# Patient Record
Sex: Male | Born: 1935 | Race: White | Hispanic: No | Marital: Married | Smoking: Former smoker
Health system: Southern US, Community
[De-identification: ages and names within clinical notes are randomized; demographics above are authoritative.]

## PROBLEM LIST (undated history)

## (undated) ENCOUNTER — Emergency Department (HOSPITAL_COMMUNITY): Discharge status: Absent without leave | Payer: Medicare Other

## (undated) DIAGNOSIS — J181 Lobar pneumonia, unspecified organism: Secondary | ICD-10-CM

## (undated) DIAGNOSIS — A419 Sepsis, unspecified organism: Secondary | ICD-10-CM

## (undated) DIAGNOSIS — I10 Essential (primary) hypertension: Secondary | ICD-10-CM

## (undated) DIAGNOSIS — I471 Supraventricular tachycardia: Secondary | ICD-10-CM

## (undated) DIAGNOSIS — D126 Benign neoplasm of colon, unspecified: Secondary | ICD-10-CM

## (undated) DIAGNOSIS — N200 Calculus of kidney: Secondary | ICD-10-CM

## (undated) DIAGNOSIS — I4891 Unspecified atrial fibrillation: Secondary | ICD-10-CM

## (undated) DIAGNOSIS — H3530 Unspecified macular degeneration: Secondary | ICD-10-CM

## (undated) DIAGNOSIS — I5032 Chronic diastolic (congestive) heart failure: Secondary | ICD-10-CM

## (undated) DIAGNOSIS — R6520 Severe sepsis without septic shock: Secondary | ICD-10-CM

## (undated) DIAGNOSIS — J9621 Acute and chronic respiratory failure with hypoxia: Secondary | ICD-10-CM

## (undated) DIAGNOSIS — I480 Paroxysmal atrial fibrillation: Secondary | ICD-10-CM

## (undated) DIAGNOSIS — E785 Hyperlipidemia, unspecified: Secondary | ICD-10-CM

## (undated) DIAGNOSIS — I482 Chronic atrial fibrillation, unspecified: Secondary | ICD-10-CM

## (undated) DIAGNOSIS — I4719 Other supraventricular tachycardia: Secondary | ICD-10-CM

## (undated) DIAGNOSIS — Z85828 Personal history of other malignant neoplasm of skin: Secondary | ICD-10-CM

## (undated) DIAGNOSIS — K5790 Diverticulosis of intestine, part unspecified, without perforation or abscess without bleeding: Secondary | ICD-10-CM

## (undated) HISTORY — DX: Calculus of kidney: N20.0

## (undated) HISTORY — DX: Other supraventricular tachycardia: I47.19

## (undated) HISTORY — DX: Supraventricular tachycardia: I47.1

## (undated) HISTORY — DX: Personal history of other malignant neoplasm of skin: Z85.828

## (undated) HISTORY — DX: Benign neoplasm of colon, unspecified: D12.6

## (undated) HISTORY — PX: INGUINAL HERNIA REPAIR: SUR1180

## (undated) HISTORY — DX: Essential (primary) hypertension: I10

## (undated) HISTORY — DX: Unspecified macular degeneration: H35.30

## (undated) HISTORY — PX: CATARACT EXTRACTION: SUR2

## (undated) HISTORY — DX: Unspecified atrial fibrillation: I48.91

## (undated) HISTORY — DX: Diverticulosis of intestine, part unspecified, without perforation or abscess without bleeding: K57.90

## (undated) HISTORY — DX: Paroxysmal atrial fibrillation: I48.0

## (undated) HISTORY — DX: Hyperlipidemia, unspecified: E78.5

---

## 1985-04-11 DIAGNOSIS — D126 Benign neoplasm of colon, unspecified: Secondary | ICD-10-CM

## 1985-04-11 HISTORY — DX: Benign neoplasm of colon, unspecified: D12.6

## 1998-05-21 ENCOUNTER — Other Ambulatory Visit: Admission: RE | Admit: 1998-05-21 | Discharge: 1998-05-21 | Payer: Self-pay | Admitting: Urology

## 1999-10-21 ENCOUNTER — Encounter: Payer: Self-pay | Admitting: Internal Medicine

## 1999-10-21 ENCOUNTER — Ambulatory Visit (HOSPITAL_COMMUNITY): Admission: RE | Admit: 1999-10-21 | Discharge: 1999-10-21 | Payer: Self-pay | Admitting: Internal Medicine

## 2001-01-25 ENCOUNTER — Encounter (INDEPENDENT_AMBULATORY_CARE_PROVIDER_SITE_OTHER): Payer: Self-pay | Admitting: *Deleted

## 2001-01-25 ENCOUNTER — Ambulatory Visit (HOSPITAL_BASED_OUTPATIENT_CLINIC_OR_DEPARTMENT_OTHER): Admission: RE | Admit: 2001-01-25 | Discharge: 2001-01-25 | Payer: Self-pay | Admitting: Plastic Surgery

## 2001-01-29 ENCOUNTER — Ambulatory Visit (HOSPITAL_BASED_OUTPATIENT_CLINIC_OR_DEPARTMENT_OTHER): Admission: RE | Admit: 2001-01-29 | Discharge: 2001-01-29 | Payer: Self-pay | Admitting: Plastic Surgery

## 2001-10-27 ENCOUNTER — Emergency Department (HOSPITAL_COMMUNITY): Admission: EM | Admit: 2001-10-27 | Discharge: 2001-10-27 | Payer: Self-pay | Admitting: Emergency Medicine

## 2001-10-27 ENCOUNTER — Encounter: Payer: Self-pay | Admitting: Emergency Medicine

## 2001-11-26 ENCOUNTER — Encounter: Payer: Self-pay | Admitting: Internal Medicine

## 2004-12-01 ENCOUNTER — Ambulatory Visit: Payer: Self-pay | Admitting: Internal Medicine

## 2005-11-25 ENCOUNTER — Ambulatory Visit: Payer: Self-pay | Admitting: Internal Medicine

## 2006-11-29 ENCOUNTER — Ambulatory Visit: Payer: Self-pay | Admitting: Internal Medicine

## 2006-11-29 LAB — CONVERTED CEMR LAB
ALT: 19 units/L (ref 0–40)
AST: 16 units/L (ref 0–37)
Albumin: 3.8 g/dL (ref 3.5–5.2)
Alkaline Phosphatase: 55 units/L (ref 39–117)
BUN: 16 mg/dL (ref 6–23)
Basophils Absolute: 0 10*3/uL (ref 0.0–0.1)
Basophils Relative: 0.4 % (ref 0.0–1.0)
CO2: 26 meq/L (ref 19–32)
Calcium: 9.5 mg/dL (ref 8.4–10.5)
Chloride: 105 meq/L (ref 96–112)
Chol/HDL Ratio, serum: 4.2
Cholesterol: 193 mg/dL (ref 0–200)
Creatinine, Ser: 1.3 mg/dL (ref 0.4–1.5)
Eosinophil percent: 2.4 % (ref 0.0–5.0)
GFR calc non Af Amer: 56 mL/min
Glomerular Filtration Rate, Af Am: 68 mL/min/{1.73_m2}
Glucose, Bld: 99 mg/dL (ref 70–99)
HCT: 48.2 % (ref 39.0–52.0)
HDL: 45.7 mg/dL (ref >39.0)
Hemoglobin: 15.5 g/dL (ref 13.0–17.0)
LDL Cholesterol: 114 mg/dL — ABNORMAL HIGH (ref 0–99)
Lymphocytes Relative: 28.3 % (ref 12.0–46.0)
MCHC: 32.1 g/dL (ref 30.0–36.0)
MCV: 96.7 fL (ref 78.0–100.0)
Monocytes Absolute: 0.6 10*3/uL (ref 0.2–0.7)
Monocytes Relative: 6.8 % (ref 3.0–11.0)
Neutro Abs: 5.4 10*3/uL (ref 1.4–7.7)
Neutrophils Relative %: 62.1 % (ref 43.0–77.0)
Platelets: 286 10*3/uL (ref 150–400)
Potassium: 4.8 meq/L (ref 3.5–5.1)
RBC: 4.98 M/uL (ref 4.22–5.81)
RDW: 12.8 % (ref 11.5–14.6)
Sodium: 139 meq/L (ref 135–145)
TSH: 0.72 microintl units/mL (ref 0.35–5.50)
Total Bilirubin: 0.8 mg/dL (ref 0.3–1.2)
Total Protein: 7.5 g/dL (ref 6.0–8.3)
Triglyceride fasting, serum: 166 mg/dL — ABNORMAL HIGH (ref 0–149)
VLDL: 33 mg/dL (ref 0–40)
WBC: 8.6 10*3/uL (ref 4.5–10.5)

## 2007-03-26 ENCOUNTER — Ambulatory Visit: Payer: Self-pay | Admitting: Gastroenterology

## 2007-05-15 ENCOUNTER — Encounter: Payer: Self-pay | Admitting: Internal Medicine

## 2007-05-15 ENCOUNTER — Ambulatory Visit: Payer: Self-pay | Admitting: Gastroenterology

## 2007-05-15 DIAGNOSIS — K5730 Diverticulosis of large intestine without perforation or abscess without bleeding: Secondary | ICD-10-CM | POA: Insufficient documentation

## 2007-05-15 DIAGNOSIS — K5521 Angiodysplasia of colon with hemorrhage: Secondary | ICD-10-CM

## 2007-05-15 LAB — HM COLONOSCOPY

## 2007-06-20 ENCOUNTER — Ambulatory Visit: Payer: Self-pay | Admitting: Gastroenterology

## 2007-11-27 ENCOUNTER — Encounter: Payer: Self-pay | Admitting: Internal Medicine

## 2007-12-04 ENCOUNTER — Ambulatory Visit: Payer: Self-pay | Admitting: Internal Medicine

## 2007-12-04 DIAGNOSIS — Z860100 Personal history of colon polyps, unspecified: Secondary | ICD-10-CM | POA: Insufficient documentation

## 2007-12-04 DIAGNOSIS — E785 Hyperlipidemia, unspecified: Secondary | ICD-10-CM

## 2007-12-04 DIAGNOSIS — I10 Essential (primary) hypertension: Secondary | ICD-10-CM | POA: Insufficient documentation

## 2007-12-05 LAB — CONVERTED CEMR LAB
ALT: 18 units/L (ref 0–53)
AST: 18 units/L (ref 0–37)
Albumin: 4.1 g/dL (ref 3.5–5.2)
Alkaline Phosphatase: 55 units/L (ref 39–117)
BUN: 15 mg/dL (ref 6–23)
Basophils Absolute: 0.1 10*3/uL (ref 0.0–0.1)
Basophils Relative: 0.8 % (ref 0.0–1.0)
Bilirubin, Direct: 0.2 mg/dL (ref 0.0–0.3)
CO2: 27 meq/L (ref 19–32)
Calcium: 9.8 mg/dL (ref 8.4–10.5)
Chloride: 103 meq/L (ref 96–112)
Cholesterol: 183 mg/dL (ref 0–200)
Creatinine, Ser: 1.2 mg/dL (ref 0.4–1.5)
Eosinophils Absolute: 0.2 10*3/uL (ref 0.0–0.6)
Eosinophils Relative: 2.2 % (ref 0.0–5.0)
GFR calc Af Amer: 75 mL/min
GFR calc non Af Amer: 62 mL/min
Glucose, Bld: 77 mg/dL (ref 70–99)
HCT: 47.2 % (ref 39.0–52.0)
HDL: 46.7 mg/dL (ref >39.0)
Hemoglobin: 15.9 g/dL (ref 13.0–17.0)
LDL Cholesterol: 107 mg/dL — ABNORMAL HIGH (ref 0–99)
Lymphocytes Relative: 23.4 % (ref 12.0–46.0)
MCHC: 33.8 g/dL (ref 30.0–36.0)
MCV: 95.7 fL (ref 78.0–100.0)
Monocytes Absolute: 0.6 10*3/uL (ref 0.2–0.7)
Monocytes Relative: 6.5 % (ref 3.0–11.0)
Neutro Abs: 6 10*3/uL (ref 1.4–7.7)
Neutrophils Relative %: 67.1 % (ref 43.0–77.0)
Platelets: 238 10*3/uL (ref 150–400)
Potassium: 5.3 meq/L — ABNORMAL HIGH (ref 3.5–5.1)
RBC: 4.93 M/uL (ref 4.22–5.81)
RDW: 12.7 % (ref 11.5–14.6)
Sodium: 140 meq/L (ref 135–145)
Total Bilirubin: 0.9 mg/dL (ref 0.3–1.2)
Total CHOL/HDL Ratio: 3.9
Total Protein: 7.7 g/dL (ref 6.0–8.3)
Triglycerides: 146 mg/dL (ref 0–149)
VLDL: 29 mg/dL (ref 0–40)
WBC: 9 10*3/uL (ref 4.5–10.5)

## 2008-01-02 ENCOUNTER — Ambulatory Visit: Payer: Self-pay | Admitting: Internal Medicine

## 2008-03-07 ENCOUNTER — Ambulatory Visit: Payer: Self-pay | Admitting: Internal Medicine

## 2008-05-07 ENCOUNTER — Ambulatory Visit: Payer: Self-pay | Admitting: Internal Medicine

## 2008-06-19 ENCOUNTER — Ambulatory Visit: Payer: Self-pay | Admitting: Internal Medicine

## 2008-06-20 LAB — CONVERTED CEMR LAB
Calcium: 9 mg/dL (ref 8.4–10.5)
Creatinine, Ser: 1.1 mg/dL (ref 0.4–1.5)
GFR calc non Af Amer: 68 mL/min
HDL: 39.5 mg/dL (ref >39.0)
LDL Cholesterol: 97 mg/dL (ref 0–99)
Sodium: 139 meq/L (ref 135–145)
Total Bilirubin: 0.7 mg/dL (ref 0.3–1.2)
Total CHOL/HDL Ratio: 4
Triglycerides: 111 mg/dL (ref 0–149)

## 2008-11-25 ENCOUNTER — Encounter: Payer: Self-pay | Admitting: Internal Medicine

## 2008-12-02 ENCOUNTER — Ambulatory Visit: Payer: Self-pay | Admitting: Internal Medicine

## 2008-12-04 LAB — CONVERTED CEMR LAB
ALT: 17 units/L (ref 0–53)
Basophils Absolute: 0.1 10*3/uL (ref 0.0–0.1)
Bilirubin, Direct: 0.1 mg/dL (ref 0.0–0.3)
CO2: 29 meq/L (ref 19–32)
Calcium: 9.3 mg/dL (ref 8.4–10.5)
Cholesterol: 171 mg/dL (ref 0–200)
HDL: 47.4 mg/dL (ref >39.0)
LDL Cholesterol: 103 mg/dL — ABNORMAL HIGH (ref 0–99)
Lymphocytes Relative: 31.9 % (ref 12.0–46.0)
MCHC: 35.9 g/dL (ref 30.0–36.0)
Neutro Abs: 4.1 10*3/uL (ref 1.4–7.7)
Neutrophils Relative %: 56.6 % (ref 43.0–77.0)
Platelets: 226 10*3/uL (ref 150–400)
Potassium: 4.5 meq/L (ref 3.5–5.1)
RDW: 12.4 % (ref 11.5–14.6)
Sodium: 141 meq/L (ref 135–145)
Total Bilirubin: 0.7 mg/dL (ref 0.3–1.2)
Triglycerides: 104 mg/dL (ref 0–149)
VLDL: 21 mg/dL (ref 0–40)

## 2008-12-17 ENCOUNTER — Encounter: Payer: Self-pay | Admitting: Internal Medicine

## 2008-12-24 ENCOUNTER — Telehealth: Payer: Self-pay | Admitting: Internal Medicine

## 2009-03-10 ENCOUNTER — Encounter: Payer: Self-pay | Admitting: Internal Medicine

## 2009-06-03 ENCOUNTER — Telehealth: Payer: Self-pay | Admitting: Internal Medicine

## 2009-09-04 ENCOUNTER — Encounter: Payer: Self-pay | Admitting: Internal Medicine

## 2009-12-09 ENCOUNTER — Telehealth: Payer: Self-pay | Admitting: Internal Medicine

## 2010-01-05 ENCOUNTER — Ambulatory Visit: Payer: Self-pay | Admitting: Internal Medicine

## 2010-01-05 DIAGNOSIS — C61 Malignant neoplasm of prostate: Secondary | ICD-10-CM | POA: Insufficient documentation

## 2010-01-26 LAB — CONVERTED CEMR LAB
Albumin: 3.8 g/dL (ref 3.5–5.2)
Basophils Absolute: 0.1 10*3/uL (ref 0.0–0.1)
Bilirubin, Direct: 0.1 mg/dL (ref 0.0–0.3)
Chloride: 109 meq/L (ref 96–112)
Cholesterol: 167 mg/dL (ref 0–200)
Eosinophils Absolute: 0.2 10*3/uL (ref 0.0–0.7)
Hemoglobin: 14.4 g/dL (ref 13.0–17.0)
LDL Cholesterol: 89 mg/dL (ref 0–99)
Lymphocytes Relative: 32.3 % (ref 12.0–46.0)
MCHC: 32.6 g/dL (ref 30.0–36.0)
Monocytes Relative: 9.8 % (ref 3.0–12.0)
Neutro Abs: 3.4 10*3/uL (ref 1.4–7.7)
Neutrophils Relative %: 53.5 % (ref 43.0–77.0)
Platelets: 182 10*3/uL (ref 150.0–400.0)
Potassium: 5.3 meq/L — ABNORMAL HIGH (ref 3.5–5.1)
RBC: 4.53 M/uL (ref 4.22–5.81)
RDW: 12.7 % (ref 11.5–14.6)
TSH: 0.72 microintl units/mL (ref 0.35–5.50)
Total Protein: 7.4 g/dL (ref 6.0–8.3)
Triglycerides: 155 mg/dL — ABNORMAL HIGH (ref 0.0–149.0)
VLDL: 31 mg/dL (ref 0.0–40.0)
WBC: 6.3 10*3/uL (ref 4.5–10.5)

## 2010-03-04 ENCOUNTER — Encounter: Payer: Self-pay | Admitting: Internal Medicine

## 2010-05-18 ENCOUNTER — Encounter: Admission: RE | Admit: 2010-05-18 | Discharge: 2010-08-16 | Payer: Self-pay | Admitting: Ophthalmology

## 2010-06-15 ENCOUNTER — Telehealth: Payer: Self-pay | Admitting: Internal Medicine

## 2010-09-08 ENCOUNTER — Encounter: Payer: Self-pay | Admitting: Internal Medicine

## 2010-11-11 HISTORY — PX: TENDON REPAIR: SHX5111

## 2010-12-04 ENCOUNTER — Emergency Department (HOSPITAL_COMMUNITY)
Admission: EM | Admit: 2010-12-04 | Discharge: 2010-12-04 | Payer: Self-pay | Source: Home / Self Care | Admitting: Emergency Medicine

## 2010-12-08 ENCOUNTER — Ambulatory Visit (HOSPITAL_COMMUNITY)
Admission: RE | Admit: 2010-12-08 | Discharge: 2010-12-09 | Payer: Self-pay | Source: Home / Self Care | Attending: Orthopedic Surgery | Admitting: Orthopedic Surgery

## 2011-01-02 ENCOUNTER — Encounter: Payer: Self-pay | Admitting: Gastroenterology

## 2011-01-07 ENCOUNTER — Other Ambulatory Visit: Payer: Self-pay | Admitting: Internal Medicine

## 2011-01-07 ENCOUNTER — Ambulatory Visit
Admission: RE | Admit: 2011-01-07 | Discharge: 2011-01-07 | Payer: Self-pay | Source: Home / Self Care | Attending: Internal Medicine | Admitting: Internal Medicine

## 2011-01-07 LAB — BASIC METABOLIC PANEL
BUN: 22 mg/dL (ref 6–23)
CO2: 27 mEq/L (ref 19–32)
Calcium: 9.4 mg/dL (ref 8.4–10.5)
Chloride: 105 mEq/L (ref 96–112)
Creatinine, Ser: 1.3 mg/dL (ref 0.4–1.5)
GFR: 56.71 mL/min — ABNORMAL LOW (ref >60.00)
Glucose, Bld: 98 mg/dL (ref 70–99)
Potassium: 4.8 mEq/L (ref 3.5–5.1)
Sodium: 140 mEq/L (ref 135–145)

## 2011-01-07 LAB — CBC WITH DIFFERENTIAL/PLATELET
Eosinophils Absolute: 0.2 10*3/uL (ref 0.0–0.7)
HCT: 39.5 % (ref 39.0–52.0)
Lymphs Abs: 2.6 10*3/uL (ref 0.7–4.0)
MCHC: 33.6 g/dL (ref 30.0–36.0)
MCV: 94.1 fl (ref 78.0–100.0)
Monocytes Absolute: 0.7 10*3/uL (ref 0.1–1.0)
Neutrophils Relative %: 67.3 % (ref 43.0–77.0)
Platelets: 264 10*3/uL (ref 150.0–400.0)

## 2011-01-07 LAB — TSH: TSH: 0.7 u[IU]/mL (ref 0.35–5.50)

## 2011-01-07 LAB — HEPATIC FUNCTION PANEL
Bilirubin, Direct: 0.1 mg/dL (ref 0.0–0.3)
Total Bilirubin: 0.5 mg/dL (ref 0.3–1.2)
Total Protein: 6.9 g/dL (ref 6.0–8.3)

## 2011-01-07 LAB — LIPID PANEL
Cholesterol: 133 mg/dL (ref 0–200)
HDL: 37 mg/dL — ABNORMAL LOW (ref >39.00)
LDL Cholesterol: 75 mg/dL (ref 0–99)
Total CHOL/HDL Ratio: 4
Triglycerides: 106 mg/dL (ref 0.0–149.0)
VLDL: 21.2 mg/dL (ref 0.0–40.0)

## 2011-01-07 LAB — PSA: PSA: 11.53 ng/mL — ABNORMAL HIGH (ref 0.10–4.00)

## 2011-01-11 NOTE — Progress Notes (Signed)
Summary: lovastatin, lisinopril  Phone Note Refill Request Message from:  Fax from Pharmacy on June 15, 2010 9:37 AM  Refills Requested: Medication #1:  LISINOPRIL 40 MG TABS once daily   Notes: [facility]  [phone]  Medication #2:  LOVASTATIN 40 MG TABS once daily   Notes: [facility]  [phone]    Initial call taken by: Debbra Riding,  June 15, 2010 9:38 AM    Prescriptions: LOVASTATIN 40 MG TABS (LOVASTATIN) once daily  #90 x 3   Entered by:   Gladis Riffle, RN   Authorized by:   Birdie Sons MD   Signed by:   Gladis Riffle, RN on 06/15/2010   Method used:   Electronically to        [facility] (mail-order)       [address]       [place], [place]  [address]       Ph: [phone]       Fax: [phone]   RxID:   [id number] LISINOPRIL 40 MG TABS (LISINOPRIL) once daily  #90 x 3   Entered by:   Gladis Riffle, RN   Authorized by:   Birdie Sons MD   Signed by:   Gladis Riffle, RN on 06/15/2010   Method used:   Electronically to        [facility]* (mail-order)       [address] [place]  [address]       Ph: [phone]       Fax: [phone]   RxID:   [id number]

## 2011-01-11 NOTE — Letter (Signed)
Summary: [facility]  [facility]   Imported By: Maryln Gottron 03/16/2010 12:16:26  _____________________________________________________________________  External Attachment:    Type:   Image     Comment:   External Document

## 2011-01-11 NOTE — Assessment & Plan Note (Signed)
Summary: emp-will fast//ccm rsc due weather/njr/pt rsc from bmp/cjr   Vital Signs:  Patient profile:   77 year old male Height:      64 inches Weight:      161 pounds Pulse rate:   80 / minute Resp:     12 per minute BP sitting:   140 / 68  (left arm)  Vitals Entered By: Gladis Riffle, RN (January 05, 2010 8:56 AM)   Prevention & Chronic Care Immunizations   Influenza vaccine: declines  (01/05/2010)    Tetanus booster: 12/02/2008: Td    Pneumococcal vaccine: Pneumovax  (11/07/2003)    H. zoster vaccine: Not documented  Colorectal Screening   Hemoccult: Not documented    Colonoscopy: AVM  (05/15/2007)   Colonoscopy due: 05/14/2017  Other Screening   PSA: Not documented   Smoking status: quit  (01/05/2010)  Lipids   Total Cholesterol: 171  (12/02/2008)   Lipid panel action/deferral: Lipid Panel ordered   LDL: 103  (12/02/2008)   LDL Direct: Not documented   HDL: 47.4  (12/02/2008)   Triglycerides: 104  (12/02/2008)    SGOT (AST): 17  (12/02/2008)   BMP action: Ordered   SGPT (ALT): 17  (12/02/2008)   Alkaline phosphatase: 44  (12/02/2008)   Total bilirubin: 0.7  (12/02/2008)  Hypertension   Last Blood Pressure: 140 / 68  (01/05/2010)   Serum creatinine: 1.1  (12/02/2008)   BMP action: Ordered   Serum potassium 4.5  (12/02/2008)    Hypertension flowsheet reviewed?: Yes   Progress toward BP goal: Unchanged  Self-Management Support :    Hypertension self-management support: Not documented    Lipid self-management support: Not documented    History of Present Illness: medicare review pt denies depression, has no memory concerns  in addition has HTN---tolerating meds without difficulty  new dx prostate --- followed by dr. borden--reviewed his note  lipids---tolerating meds without difficulty  Preventive Screening-Counseling & Management  Alcohol-Tobacco     Smoking Status: quit  Current Problems (verified): 1)  Malignant Neoplasm of Prostate   (ICD-185) 2)  Angiodysplasia of Intestine With Hemorrhage  (ICD-569.85) 3)  Diverticulosis, Colon  (ICD-562.10) 4)  Hypertension  (ICD-401.9) 5)  Hyperlipidemia  (ICD-272.4) 6)  Colonic Polyps, Hx of  (ICD-V12.72)  Current Medications (verified): 1)  Lisinopril 40 Mg Tabs (Lisinopril) .... Once Daily 2)  Lovastatin 40 Mg Tabs (Lovastatin) .... Once Daily 3)  Baby Aspirin 81 Mg  Chew (Aspirin) .... Once Daily 4)  Abc Plus Senior   Tabs (Multiple Vitamins-Minerals) .... Once Daily 5)  Azor 10-40 Mg  Tabs (Amlodipine-Olmesartan) .... Take 1 Tab By Mouth Every Day 6)  Caltrate 600+d 600-400 Mg-Unit Tabs (Calcium Carbonate-Vitamin D) .... Once Daily  Allergies (verified): No Known Drug Allergies  Comments:  Nurse/Medical Assistant: annual review of systems, fasting--  The patient's medications and allergies were reviewed with the patient and were updated in the Medication and Allergy Lists. Gladis Riffle, RN (January 05, 2010 8:58 AM)  Past History:  Past Medical History: Last updated: 12/04/2007 Colonic polyps, hx of Hyperlipidemia Hypertension PAT R eye macular degeneration  Past Surgical History: Last updated: 12/04/2007 Cataract extraction Inguinal herniorrhaphy  Family History: Last updated: 12/04/2007 Family History Lung cancer-60 mother deceased 28 yo  Social History: Last updated: 12/04/2007 Married Former Smoker  Risk Factors: Smoking Status: quit (01/05/2010)  Review of Systems       All other systems reviewed and were negative   Physical Exam  General:  Well-developed,well-nourished,in no acute distress;  alert,appropriate and cooperative throughout examination Head:  Normocephalic and atraumatic without obvious abnormalities. No apparent alopecia or balding. Ears:  R ear normal and L ear normal.   Neck:  No deformities, masses, or tenderness noted. Chest Wall:  No deformities, masses, tenderness or gynecomastia noted. Lungs:  Normal respiratory  effort, chest expands symmetrically. Lungs are clear to auscultation, no crackles or wheezes. Heart:  Normal rate and regular rhythm. S1 and S2 normal without gallop, murmur, click, rub or other extra sounds. Abdomen:  soft and non-tender.   bilateral abdominal/enal bruits R>L Prostate:  UROLOGY Msk:  normal ROM and no joint swelling.   Neurologic:  cranial nerves II-XII intact and gait normal.   Skin:  turgor normal and color normal.   Inguinal Nodes:  no R inguinal adenopathy and no L inguinal adenopathy.   Psych:  normally interactive and good eye contact.     Impression & Recommendations:  Problem # 1:  Preventive Health Care (ICD-V70.0) medicare visit  Problem # 2:  HYPERTENSION (ICD-401.9)  he is on three meds---may need to change meds---will decide after lab work BP not adequately controlled--BP at home 140-150s/60s His updated medication list for this problem includes:    Lisinopril 40 Mg Tabs (Lisinopril) ..... Once daily    Azor 10-40 Mg Tabs (Amlodipine-olmesartan) .Marland Kitchen... Take 1 tab by mouth every day  Orders: TLB-BMP (Basic Metabolic Panel-BMET) (80048-METABOL)  BP today: 140/68 Prior BP: 162/74 (12/02/2008)  Prior 10 Yr Risk Heart Disease: 33 % (01/02/2008)  Labs Reviewed: K+: 4.5 (12/02/2008) Creat: : 1.1 (12/02/2008)   Chol: 171 (12/02/2008)   HDL: 47.4 (12/02/2008)   LDL: 103 (12/02/2008)   TG: 104 (12/02/2008)  Problem # 3:  HYPERLIPIDEMIA (ICD-272.4) check labs today discussed the possibility of changing meds (simvastatin)---consider after labs His updated medication list for this problem includes:    Lovastatin 40 Mg Tabs (Lovastatin) ..... Once daily  Orders: TLB-Lipid Panel (80061-LIPID) TLB-TSH (Thyroid Stimulating Hormone) (84443-TSH) Venipuncture (82956)  Complete Medication List: 1)  Lisinopril 40 Mg Tabs (Lisinopril) .... Once daily 2)  Lovastatin 40 Mg Tabs (Lovastatin) .... Once daily 3)  Baby Aspirin 81 Mg Chew (Aspirin) .... Once  daily 4)  Abc Plus Senior Tabs (Multiple vitamins-minerals) .... Once daily 5)  Azor 10-40 Mg Tabs (Amlodipine-olmesartan) .... Take 1 tab by mouth every day 6)  Caltrate 600+d 600-400 Mg-unit Tabs (Calcium carbonate-vitamin d) .... Once daily  Other Orders: TLB-Hepatic/Liver Function Pnl (80076-HEPATIC) TLB-CBC Platelet - w/Differential (85025-CBCD) EMR Misc Charge Code [facility])   Immunization History:  Pneumovax Immunization History:    Pneumovax:  pneumovax (11/07/2003)  Influenza Immunization History:    Influenza:  declines (01/05/2010)

## 2011-01-11 NOTE — Letter (Signed)
Summary: [facility]  [facility]   Imported By: Maryln Gottron 09/15/2010 14:58:22  _____________________________________________________________________  External Attachment:    Type:   Image     Comment:   External Document

## 2011-01-13 NOTE — Assessment & Plan Note (Signed)
Summary: emp/pt coming in fasting/cjr   Vital Signs:  Patient profile:   77 year old male Height:      64 inches Weight:      148 pounds BMI:     25.50 Temp:     98.7 degrees F oral Pulse rate:   92 / minute Pulse rhythm:   regular BP sitting:   132 / 80  (left arm) Cuff size:   regular  Vitals Entered By: Alfred Levins, CMA (January 07, 2011 10:19 AM) CC: cpx, fasting   CC:  cpx and fasting.  History of Present Illness: Here for Medicare AWV:  1.   Risk factors based on Past M, S, F history:---see list 2.   Physical Activities: --pt remains acitve but has had recent fall with significant injury to right leg 3.   Depression/mood: ---no concerns 4.   Hearing: ---no concerns 5.   ADL's: ---able to do all even with brace on his leg 6.   Fall Risk: -- no concerns---using walker currently 7.   Home Safety: --no concerns 8.   Height, weight, &visual acuity:--doing well 9.   Counseling: ---none necessary 10.   Labs ordered based on risk factors: see labs  11.           Referral Coordination : none necessary 12.           Care Plan--- continue excellent health habits 13.            Cognitive Assessment: no concerns  Current Problems:  MALIGNANT NEOPLASM OF PROSTATE (ICD-185)---followed by urology ANGIODYSPLASIA OF INTESTINE WITH HEMORRHAGE (ICD-569.85)--no Gi bleeding HYPERTENSION (ICD-401.9)---no sxs on meds HYPERLIPIDEMIA (ICD-272.4)---tolerating meds       Current Problems (verified): 1)  Health Screening  (ICD-V70.0) 2)  Malignant Neoplasm of Prostate  (ICD-185) 3)  Angiodysplasia of Intestine With Hemorrhage  (ICD-569.85) 4)  Diverticulosis, Colon  (ICD-562.10) 5)  Hypertension  (ICD-401.9) 6)  Hyperlipidemia  (ICD-272.4) 7)  Colonic Polyps, Hx of  (ICD-V12.72)  Current Medications (verified): 1)  Lisinopril 40 Mg Tabs (Lisinopril) .... Once Daily 2)  Lovastatin 40 Mg Tabs (Lovastatin) .... Once Daily 3)  Baby Aspirin 81 Mg  Chew (Aspirin) .... Once Daily 4)   Abc Plus Senior   Tabs (Multiple Vitamins-Minerals) .... Once Daily 5)  Azor 10-40 Mg  Tabs (Amlodipine-Olmesartan) .... Take 1 Tab By Mouth Every Day 6)  Caltrate 600+d 600-400 Mg-Unit Tabs (Calcium Carbonate-Vitamin D) .... Once Daily  Allergies (verified): No Known Drug Allergies  Past History:  Past Medical History: Last updated: 12/04/2007 Colonic polyps, hx of Hyperlipidemia Hypertension PAT R eye macular degeneration  Family History: Last updated: 12/04/2007 Family History Lung cancer-60 mother deceased 73 yo  Social History: Last updated: 12/04/2007 Married Former Smoker  Risk Factors: Smoking Status: quit (01/05/2010)  Past Surgical History: Cataract extraction Inguinal herniorrhaphy right leg tendon repair 11/2010  Physical Exam  General:  alert and well-developed.   Head:  normocephalic and atraumatic.   Eyes:  pupils equal and pupils round.   Ears:  R ear normal and L ear normal.   Neck:  No deformities, masses, or tenderness noted. Lungs:  Normal respiratory effort, chest expands symmetrically. Lungs are clear to auscultation, no crackles or wheezes. Heart:  normal rate and regular rhythm.   Abdomen:  soft and non-tender.   Cervical Nodes:  no anterior cervical adenopathy and no posterior cervical adenopathy.   Psych:  good eye contact and not anxious appearing.     Impression & Recommendations:  Problem # 1:  HEALTH SCREENING (ICD-V70.0)  health maint utd  Orders: Medicare -1st Annual Wellness Visit [phone])  Problem # 2:  HYPERTENSION (ICD-401.9) controlled continue current medications  note ACE ARB combination His updated medication list for this problem includes:    Lisinopril 40 Mg Tabs (Lisinopril) ..... Once daily    Azor 10-40 Mg Tabs (Amlodipine-olmesartan) .Marland Kitchen... Take 1 tab by mouth every day  Orders: Fingerstick (60454) Specimen Handling (09811) Venipuncture (91478) TLB-BMP (Basic Metabolic Panel-BMET)  (80048-METABOL) TLB-CBC Platelet - w/Differential (85025-CBCD)  BP today: 132/80 Prior BP: 140/68 (01/05/2010)  Prior 10 Yr Risk Heart Disease: 33 % (01/02/2008)  Labs Reviewed: K+: 5.3 (01/05/2010) Creat: : 1.2 (01/05/2010)   Chol: 167 (01/05/2010)   HDL: 47.30 (01/05/2010)   LDL: 89 (01/05/2010)   TG: 155.0 (01/05/2010)  Problem # 3:  HYPERLIPIDEMIA (ICD-272.4) needs labs His updated medication list for this problem includes:    Lovastatin 40 Mg Tabs (Lovastatin) ..... Once daily  Orders: Fingerstick ([address]) Specimen Handling (13086) Venipuncture (57846) TLB-Lipid Panel (80061-LIPID) TLB-Hepatic/Liver Function Pnl (80076-HEPATIC) TLB-TSH (Thyroid Stimulating Hormone) (84443-TSH)  Labs Reviewed: SGOT: 19 (01/05/2010)   SGPT: 18 (01/05/2010)  Prior 10 Yr Risk Heart Disease: 33 % (01/02/2008)   HDL:47.30 (01/05/2010), 47.4 (12/02/2008)  LDL:89 (01/05/2010), 103 (96/29/5284)  Chol:167 (01/05/2010), 171 (12/02/2008)  Trig:155.0 (01/05/2010), 104 (12/02/2008)  Problem # 4:  MALIGNANT NEOPLASM OF PROSTATE (ICD-185) followed by urology Orders: Fingerstick (13244) Specimen Handling (01027) TLB-PSA (Prostate Specific Antigen) (84153-PSA)  Complete Medication List: 1)  Lisinopril 40 Mg Tabs (Lisinopril) .... Once daily 2)  Lovastatin 40 Mg Tabs (Lovastatin) .... Once daily 3)  Baby Aspirin 81 Mg Chew (Aspirin) .... Once daily 4)  Abc Plus Senior Tabs (Multiple vitamins-minerals) .... Once daily 5)  Azor 10-40 Mg Tabs (Amlodipine-olmesartan) .... Take 1 tab by mouth every day 6)  Caltrate 600+d 600-400 Mg-unit Tabs (Calcium carbonate-vitamin d) .... Once daily Prescriptions: AZOR 10-40 MG  TABS (AMLODIPINE-OLMESARTAN) Take 1 tab by mouth every day  #90 x 3   Entered by:   Alfred Levins, CMA   Authorized by:   Birdie Sons MD   Signed by:   Alfred Levins, CMA on 01/07/2011   Method used:   Electronically to        [facility] ([facility]order)       [address]       [place], [place]  [address]       Ph: [phone]       Fax: [phone]   RxID:   [id number] LOVASTATIN 40 MG TABS (LOVASTATIN) once daily  #90 x 3   Entered by:   Alfred Levins, CMA   Authorized by:   Birdie Sons MD   Signed by:   Alfred Levins, CMA on 01/07/2011   Method used:   Electronically to        [facility] ([facility]order)       [address]       [place], [place]  [address]       Ph: [phone]       Fax: [phone]   RxID:   [id number] LISINOPRIL 40 MG TABS (LISINOPRIL) once daily  #90 x 3   Entered by:   Alfred Levins, CMA   Authorized by:   Birdie Sons MD   Signed by:   Alfred Levins, CMA on 01/07/2011   Method used:   Electronically to        [facility] ([facility]order)  [address]       [place], [place]  [address]       Ph: [phone]       Fax: [phone]   RxID:   [id number]    Orders Added: 1)  Fingerstick [56387] 2)  Specimen Handling [99000] 3)  Venipuncture [56433] 4)  TLB-Lipid Panel [80061-LIPID] 5)  TLB-BMP (Basic Metabolic Panel-BMET) [80048-METABOL] 6)  TLB-CBC Platelet - w/Differential [85025-CBCD] 7)  TLB-Hepatic/Liver Function Pnl [80076-HEPATIC] 8)  TLB-TSH (Thyroid Stimulating Hormone) [84443-TSH] 9)  TLB-PSA (Prostate Specific Antigen) [29518-ACZ] 10)  Medicare -1st Annual Wellness Visit [G0438] 11)  Est. Patient Level III [66063]

## 2011-02-21 LAB — CBC
Hemoglobin: 14.7 g/dL (ref 13.0–17.0)
MCHC: 34.9 g/dL (ref 30.0–36.0)
RBC: 4.56 MIL/uL (ref 4.22–5.81)

## 2011-02-21 LAB — URINALYSIS, ROUTINE W REFLEX MICROSCOPIC
Bilirubin Urine: NEGATIVE
Glucose, UA: NEGATIVE mg/dL
Hgb urine dipstick: NEGATIVE
Specific Gravity, Urine: 1.018 (ref 1.005–1.030)

## 2011-02-21 LAB — BASIC METABOLIC PANEL
Calcium: 9.2 mg/dL (ref 8.4–10.5)
GFR calc Af Amer: >60 mL/min (ref >60)
GFR calc non Af Amer: 56 mL/min — ABNORMAL LOW (ref >60)
Glucose, Bld: 115 mg/dL — ABNORMAL HIGH (ref 70–99)
Sodium: 136 mEq/L (ref 135–145)

## 2011-02-21 LAB — DIFFERENTIAL
Basophils Relative: 0 % (ref 0–1)
Monocytes Relative: 8 % (ref 3–12)
Neutro Abs: 5.7 10*3/uL (ref 1.7–7.7)
Neutrophils Relative %: 59 % (ref 43–77)

## 2011-02-21 LAB — PROTIME-INR
INR: 0.96 (ref 0.00–1.49)
Prothrombin Time: 13 seconds (ref 11.6–15.2)

## 2011-02-21 LAB — APTT: aPTT: 32 seconds (ref 24–37)

## 2011-03-02 ENCOUNTER — Other Ambulatory Visit: Payer: Self-pay | Admitting: *Deleted

## 2011-03-02 ENCOUNTER — Telehealth: Payer: Self-pay | Admitting: *Deleted

## 2011-03-02 NOTE — Telephone Encounter (Signed)
Find out dose of azor

## 2011-03-02 NOTE — Telephone Encounter (Signed)
Pt is taking Azor ( very expensive).  Any generic around as of yet ?? Needs pres sent to him, if so. Not out of meds. [phone]

## 2011-03-02 NOTE — Telephone Encounter (Signed)
Left message to ask what mg of Azor pt is on?  40-10 daily.

## 2011-03-04 MED ORDER — LOSARTAN POTASSIUM 100 MG PO TABS: 100.0000 mg | ORAL_TABLET | Freq: Every day | ORAL | Status: DC

## 2011-03-04 MED ORDER — AMLODIPINE BESYLATE 10 MG PO TABS: 10.0000 mg | ORAL_TABLET | Freq: Every day | ORAL | Status: DC

## 2011-03-04 NOTE — Telephone Encounter (Signed)
Change Azor to losartan 100 mg. One po daily. Amlodipine 10 mg. One po daily. [place] notified.

## 2011-04-18 ENCOUNTER — Encounter: Payer: Self-pay | Admitting: Internal Medicine

## 2011-04-19 ENCOUNTER — Ambulatory Visit (INDEPENDENT_AMBULATORY_CARE_PROVIDER_SITE_OTHER): Payer: Medicare Other | Admitting: Internal Medicine

## 2011-04-19 ENCOUNTER — Encounter: Payer: Self-pay | Admitting: Internal Medicine

## 2011-04-19 DIAGNOSIS — E785 Hyperlipidemia, unspecified: Secondary | ICD-10-CM

## 2011-04-19 DIAGNOSIS — I10 Essential (primary) hypertension: Secondary | ICD-10-CM

## 2011-04-19 MED ORDER — HYDROCHLOROTHIAZIDE 25 MG PO TABS: 25.0000 mg | ORAL_TABLET | Freq: Every day | ORAL | Status: DC

## 2011-04-19 MED ORDER — LOSARTAN POTASSIUM 100 MG PO TABS: 100.0000 mg | ORAL_TABLET | Freq: Every day | ORAL | Status: DC

## 2011-04-19 MED ORDER — AMLODIPINE BESYLATE 10 MG PO TABS: 10.0000 mg | ORAL_TABLET | Freq: Every day | ORAL | Status: DC

## 2011-04-19 NOTE — Progress Notes (Signed)
  Subjective:    Patient ID: Andre [name], male    DOB: 07/24/1923, 77 y.o.   MRN: [id number]  HPI  htn---tolerating meds, home bps 150s/60s  Knee OA---sees dr Ranell Patrick Has some residual pain  Lipids---tolerating meds   Past Medical History  Diagnosis Date  . Colon polyps   . Hyperlipidemia   . Hypertension   . PAT (paroxysmal atrial tachycardia)   . Macular degeneration     right eye   Past Surgical History  Procedure Date  . Cataract extraction   . Hernia repair   . Tendon repair 12/11    right leg    reports that he has quit smoking. He does not have any smokeless tobacco history on file. His alcohol and drug histories not on file. family history is not on file. No Known Allergies  Review of Systems  patient denies chest pain, shortness of breath, orthopnea. Denies lower extremity edema, abdominal pain, change in appetite, change in bowel movements. Patient denies rashes, musculoskeletal complaints. No other specific complaints in a complete review of systems.      Objective:   Physical Exam  well-developed well-nourished male in no acute distress. HEENT exam atraumatic, normocephalic, neck supple without jugular venous distention. Chest clear to auscultation cardiac exam S1-S2 are regular. Abdominal exam overweight with bowel sounds, soft and nontender. Extremities no edema. Neurologic exam is alert with a normal gait--somewhat broad based.         Assessment & Plan:

## 2011-04-24 NOTE — Assessment & Plan Note (Signed)
Not adequately controlled Will add hctz Side effects discussed

## 2011-04-24 NOTE — Assessment & Plan Note (Signed)
Controlled Continue current meds 

## 2011-04-26 ENCOUNTER — Other Ambulatory Visit: Payer: Self-pay | Admitting: Internal Medicine

## 2011-04-26 NOTE — Assessment & Plan Note (Signed)
[facility]                         GASTROENTEROLOGY OFFICE NOTE   THALES, KNIPPLE                   MRN:          [id number]  DATE:06/20/2007                            DOB:          [birth year]    Mr. Hofman had no complaints following his recent colonoscopy.  The  findings and treatment at colonoscopy were discussed with the patient.  He has had no gastrointestinal bleeding or any complaints since his  colonoscopy except for occasional intestinal gas which has been a  minimal chronic complaint.   CURRENT MEDICATIONS:  Listed on the chart, updated and reviewed.   MEDICATION ALLERGIES:  NONE KNOWN.   EXAMINATION:  No acute distress, weight 164.4 pounds, blood pressure is  132/62, pulse 84 and regular.  CHEST:  Clear to auscultation bilaterally.  CARDIAC:  Regular rate and rhythm without murmurs appreciated.  ABDOMEN:  Soft, nontender, normal active bowel sounds.   ASSESSMENT/PLAN:  1. Personal history of adenomatous colon polyps.  If his health status      is appropriate we will plan for recall colonoscopy in 5 years.  2. Diverticulosis.  Maintain a longterm high fiber diet with adequate      fluid intake.  3. Colonic angiodysplasias.  An actively bleeding angiodysplasia was      treated at the time of colonoscopy and he had not had recurrent      bleeding.  If he has significant recurrent bleeding he will contact      my office.     Venita Lick. Russella Dar, MD, [facility]  Electronically Signed    MTS/MedQ  DD: 06/22/2007  DT: 06/22/2007  Job #: [id number]   cc:   Valetta Mole. Swords, MD

## 2011-04-29 NOTE — Op Note (Signed)
. [facility]  Patient:    Andre [name], Andre [name]                   MRN: [id number] Proc. Date: 01/29/01 Adm. Date:  [id number] Attending:  Chapman Moss                           Operative Report  PREOPERATIVE DIAGNOSIS:  Complicated wound of the scalp.  POSTOPERATIVE DIAGNOSIS:  Complicated wound of the scalp.  OPERATION PERFORMED:  Planned staged procedure, scalp reconstruction with full thickness skin graft for wound due to excision of squamous cell carcinoma.  SURGEON:  Teena Irani. Odis Luster, M.D.  ANESTHESIA:  1% Xylocaine with epinephrine plus bicarb.  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  Negative margins have been confirmed on final pathology report.  He presents today for reconstruction.  The nature of the procedure as well as the risks were well understood by him including color mismatches, wound healing problems, and skin graft loss and he wished to proceed.  DESCRIPTION OF PROCEDURE:  The patient was taken to the operating room and placed supine.  The head was then elevated slightly.  He was prepped with Betadine and draped with sterile drapes and local anesthesia with 1% Xylocaine with epinephrine plus bicarb.  The wound was covered with a saline moistened gauze.  The harvest was performed first from the left neck orienting the harvest transversely with the skin crease.  Local anesthesia was achieved and the harvest was performed.  We achieved meticulous hemostasis with electrocautery.  The wound was irrigated thoroughly with saline and closure with 4-0 Vicryl interrupted inverted sutures and Steri-Strips.  The wound was then prepared by debridement of the base of the wound.  Thorough irrigation with saline.  There appeared to be a nice vascular surface on the wound.  The skin graft was prepared  removing all the fat posteriorly.  The graft was applied and secured with 4-0 silk interrupted sutures around the periphery.  Xeroform gauze and a moistened cotton ball was then used as a bolster dressing and the silk tied over this.  Antibiotic ointment was applied around the periphery.  A dry sterile dressing was applied.  The patient tolerated the procedure well.  DISPOSITION:  Keflex 500 mg p.o. q.i.d.  Keep his head propped up.  We will see him back in the office on Friday four days from now for suture removal and check of the graft.  Keep dressings completely dry. DD:  01/29/01 TD:  01/29/01 Job: [id number]

## 2011-04-29 NOTE — Assessment & Plan Note (Signed)
[facility]                         GASTROENTEROLOGY OFFICE NOTE   NAME:Andre [name], Andre [name]                   MRN:          [id number]  DATE:03/26/2007                            DOB:          [birth year]    This very nice patient comes in to discuss a recall colonoscopic  examination.  He has had several colonoscopic examinations in the past  and he always multiple small polyps.  He is a patient of Andre H.  Swords, MD.  He is 77 years of age and I wanted to talk to him and  review his chart and while talking with him decide whether he would need  to have follow-up exam.  He has had no symptoms, an amazingly healthy 71-  year-old, still hiking and so on.   His past medical history only reviews some hyperlipidemia and  hypertension.   SOCIAL HISTORY:  Noncontributory.   FAMILY HISTORY:  Noncontributory.   REVIEW OF SYSTEMS:  He does not even mark off anything.   PHYSICAL EXAMINATION:  VITAL SIGNS:  He is 5 feet 5 inches and weighs  165.  Blood pressure 158/64, pulse 64 and regular.  Neck, heart and extremities are unremarkable.   MEDICATIONS:  Diltiazem, aspirin, vitamins, lisinopril and lovastatin.   IMPRESSION:  1. Hypertension.  2. Arteriosclerotic cardiovascular disease.  3. Status post multiple colon polyps.  4. Hyperlipidemia.   RECOMMENDATIONS:  To have a colonoscopic examination because of the  number of polyps he has had in the past and because of his overall good  health even at 77 years of age.  We talked about it and he was in  agreement with this.  I do not normally do this but with all of his  polyps that he has had in past years on follow-up exams, I think the  best thing to do is to go ahead and do this at this point in time.     Andre Mort, MD  Electronically Signed    SML/MedQ  DD: 03/26/2007  DT: 03/27/2007  Job #: [id number]   cc:   Andre [name]. Swords, MD

## 2011-04-29 NOTE — Letter (Signed)
March 26, 2007    Bruce H. Swords, MD  [address] [place], [place] [address]   RE:  Andre [name], Andre [name]  MRN:  [id number]  /  DOB:  08-07-1923   Dear Smitty Cords:   I wanted you to know that Mr. Andre [name], who I recently saw as a patient  of yours, had some very nice comments to say about your care and I think  it is always nice when we hear these things from patients.  He is doing  quite well and because of his previous history of multiple colon polyps,  I think even at 77 years of age it would be advisable to have him pursue  a colonoscopic exam; therefore, we will schedule this after he returns  from a very nice trip to [place] next month.   Best personal regards.    Sincerely,      [name], MD  Electronically Signed    SML/MedQ  DD: 03/26/2007  DT: 03/27/2007  Job #: [id number]

## 2011-04-29 NOTE — Op Note (Signed)
[facility]  Patient:    Andre [name], Andre [name]                   MRN: [id number] Proc. Date: 01/25/01 Adm. 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  PROCEDURE:  Excision squamous cell carcinoma of the scalp greater than 2.0 cm.  SURGEON:  Teena Irani. Odis Luster, M.D.  ANESTHESIA:  1% Xylocaine with epinephrine plus bicarbonate.  CLINICAL NOTE:  A 77 year old man with a biopsy-proven squamous cell carcinoma, well-differentiated, of his scalp.  He was referred for a wider excision and reconstruction.  The nature of this procedure and the risks were discussed with him in detail, and he understood that there was a distinct possibility that the wound could not be closed and would require skin grafting.  It was felt that this should be a staged procedure, since negative margins needed to be confirmed on the final pathology report prior to reconstruction.  The plan would be to perform a full thickness skin graft harvested from the neck area several days later pending the pathology report. He wished to proceed.  DESCRIPTION OF PROCEDURE:  The patient was taken to the operating room and placed in a head-up position.  He was prepped with Betadine and draped with sterile drapes.  The planned excision was marked, taking an approximately 5-6 mm margin of grossly normal tissue around the lesion.  The local anesthetic was infiltrated, and the excision was performed.  The specimen was obtained and removed from the table.  The wound was irrigated thoroughly.  Meticulous hemostasis with electrocautery.  Some minimal undermining of the skin edges was performed, and at either end a portion of the wound was closed with a 2-0 Vicryl interrupted inverted  deep suture.  The majority of the wound was left open, and this would be reconstructed in a couple of days pending the final pathology report.  A Xeroform gauze was placed in the wound and sutured in place using 4-0 Prolene simple interrupted sutures.  Antibiotic ointment was placed over this and a dry sterile dressing, and he tolerated the procedure well.  DISPOSITION:  Percocet, _____, a total of 12 given, one p.o. q.6h. p.r.n. for pain.  Will see him back at the day surgery center on Monday for his reconstruction.  He needs to keep his head elevated and keep his dressing dry. DD:  01/25/01 TD:  01/25/01 Job: [id number]

## 2011-05-24 ENCOUNTER — Encounter: Payer: Self-pay | Admitting: Internal Medicine

## 2011-05-24 ENCOUNTER — Ambulatory Visit (INDEPENDENT_AMBULATORY_CARE_PROVIDER_SITE_OTHER): Payer: Medicare Other | Admitting: Internal Medicine

## 2011-05-24 DIAGNOSIS — E785 Hyperlipidemia, unspecified: Secondary | ICD-10-CM

## 2011-05-24 DIAGNOSIS — I10 Essential (primary) hypertension: Secondary | ICD-10-CM

## 2011-05-24 MED ORDER — LOVASTATIN 40 MG PO TABS: 40.0000 mg | ORAL_TABLET | Freq: Every day | ORAL | Status: DC

## 2011-05-24 MED ORDER — AMLODIPINE BESYLATE 10 MG PO TABS: 5.0000 mg | ORAL_TABLET | Freq: Every day | ORAL | Status: DC

## 2011-05-24 MED ORDER — HYDROCHLOROTHIAZIDE 25 MG PO TABS: 25.0000 mg | ORAL_TABLET | Freq: Every day | ORAL | Status: DC

## 2011-05-24 MED ORDER — LOSARTAN POTASSIUM 100 MG PO TABS: 100.0000 mg | ORAL_TABLET | Freq: Every day | ORAL | Status: DC

## 2011-05-24 NOTE — Assessment & Plan Note (Signed)
Adequate control Continue meds 

## 2011-05-24 NOTE — Assessment & Plan Note (Signed)
Better controlled Continue meds

## 2011-05-24 NOTE — Progress Notes (Signed)
  Subjective:    Patient ID: Andre [name], male    DOB: 12-Jun-1923, 77 y.o.   MRN: [id number]  HPI F/u bp Home bps--130s/60s  Past Medical History  Diagnosis Date  . Colon polyps   . Hyperlipidemia   . Hypertension   . PAT (paroxysmal atrial tachycardia)   . Macular degeneration     right eye   Past Surgical History  Procedure Date  . Cataract extraction   . Hernia repair   . Tendon repair 12/11    right leg    reports that he has quit smoking. He does not have any smokeless tobacco history on file. His alcohol and drug histories not on file. family history is not on file. No Known Allergies  Review of Systems  patient denies chest pain, shortness of breath, orthopnea. Denies lower extremity edema, abdominal pain, change in appetite, change in bowel movements. Patient denies rashes, musculoskeletal complaints. No other specific complaints in a complete review of systems.      Objective:   Physical Exam  well-developed well-nourished male in no acute distress. HEENT exam atraumatic, normocephalic, neck supple without jugular venous distention. Chest clear to auscultation cardiac exam S1-S2 are regular. Abdominal exam overweight with bowel sounds, soft and nontender. Extremities no edema. Neurologic exam is alert with a normal gait.        Assessment & Plan:

## 2011-05-30 ENCOUNTER — Telehealth: Payer: Self-pay | Admitting: *Deleted

## 2011-05-30 NOTE — Telephone Encounter (Signed)
Pt's discharge sheet from last office visit states he should be on 1/2 Norvasc,and pt thinks that he was to remain on one daily and needs to know which is correct?

## 2011-05-31 NOTE — Telephone Encounter (Signed)
He should stay on same dose he was on prior to visit---if chart is incorrect please correct

## 2011-06-01 NOTE — Telephone Encounter (Signed)
Spoke with patient and medication corrected in chart

## 2011-06-06 ENCOUNTER — Other Ambulatory Visit: Payer: Self-pay | Admitting: Dermatology

## 2011-07-01 ENCOUNTER — Other Ambulatory Visit: Payer: Self-pay | Admitting: *Deleted

## 2011-07-01 MED ORDER — AMLODIPINE BESYLATE 10 MG PO TABS: 10.0000 mg | ORAL_TABLET | Freq: Every day | ORAL | Status: DC

## 2011-10-17 ENCOUNTER — Other Ambulatory Visit: Payer: Self-pay | Admitting: Dermatology

## 2012-01-09 ENCOUNTER — Telehealth: Payer: Self-pay | Admitting: Internal Medicine

## 2012-01-09 NOTE — Telephone Encounter (Signed)
Pt called and cx his cpx schd for 2012-01-13, due to a death in family and will be out of town some this wk. Pt has rschd for 02/03/12 but is needing to get in sooner than this. Pls advise.

## 2012-01-10 ENCOUNTER — Encounter: Payer: Medicare Other | Admitting: Internal Medicine

## 2012-01-12 NOTE — Telephone Encounter (Signed)
Pt called to check on status of getting fasting cpx sooner than 02/03/12. Pt said that if nothing else is avail, than 02/03/12 will be fine with him.

## 2012-01-12 NOTE — Telephone Encounter (Signed)
unfortunately we can't work in sooner

## 2012-02-03 ENCOUNTER — Ambulatory Visit (INDEPENDENT_AMBULATORY_CARE_PROVIDER_SITE_OTHER): Payer: Medicare Other | Admitting: Internal Medicine

## 2012-02-03 ENCOUNTER — Encounter: Payer: Self-pay | Admitting: Internal Medicine

## 2012-02-03 DIAGNOSIS — Z2911 Encounter for prophylactic immunotherapy for respiratory syncytial virus (RSV): Secondary | ICD-10-CM

## 2012-02-03 DIAGNOSIS — I10 Essential (primary) hypertension: Secondary | ICD-10-CM

## 2012-02-03 DIAGNOSIS — C61 Malignant neoplasm of prostate: Secondary | ICD-10-CM

## 2012-02-03 DIAGNOSIS — E785 Hyperlipidemia, unspecified: Secondary | ICD-10-CM

## 2012-02-03 LAB — HEPATIC FUNCTION PANEL
ALT: 15 U/L (ref 0–53)
AST: 19 U/L (ref 0–37)
Alkaline Phosphatase: 44 U/L (ref 39–117)
Bilirubin, Direct: 0.1 mg/dL (ref 0.0–0.3)
Total Protein: 7.7 g/dL (ref 6.0–8.3)

## 2012-02-03 LAB — CBC WITH DIFFERENTIAL/PLATELET
Basophils Relative: 0.4 % (ref 0.0–3.0)
Eosinophils Absolute: 0.2 10*3/uL (ref 0.0–0.7)
MCHC: 33.1 g/dL (ref 30.0–36.0)
MCV: 96.3 fl (ref 78.0–100.0)
Monocytes Absolute: 0.7 10*3/uL (ref 0.1–1.0)
Neutrophils Relative %: 62.2 % (ref 43.0–77.0)
Platelets: 254 10*3/uL (ref 150.0–400.0)
RDW: 14 % (ref 11.5–14.6)

## 2012-02-03 LAB — BASIC METABOLIC PANEL
CO2: 29 mEq/L (ref 19–32)
Chloride: 102 mEq/L (ref 96–112)
Sodium: 139 mEq/L (ref 135–145)

## 2012-02-03 LAB — LIPID PANEL
Total CHOL/HDL Ratio: 3
Triglycerides: 134 mg/dL (ref 0.0–149.0)

## 2012-02-03 MED ORDER — LOVASTATIN 40 MG PO TABS: 40.0000 mg | ORAL_TABLET | Freq: Every day | ORAL | Status: DC

## 2012-02-03 MED ORDER — HYDROCHLOROTHIAZIDE 25 MG PO TABS: 25.0000 mg | ORAL_TABLET | Freq: Every day | ORAL | Status: DC

## 2012-02-03 MED ORDER — LOSARTAN POTASSIUM 100 MG PO TABS: 100.0000 mg | ORAL_TABLET | Freq: Every day | ORAL | Status: DC

## 2012-02-03 MED ORDER — AMLODIPINE BESYLATE 10 MG PO TABS: 10.0000 mg | ORAL_TABLET | Freq: Every day | ORAL | Status: DC

## 2012-02-03 NOTE — Progress Notes (Signed)
Subjective:    Andre [name] is a 78 y.o. male who presents for Medicare Annual/Subsequent preventive examination.   Preventive Screening-Counseling & Management  Tobacco History  Smoking status  . Former Smoker  Smokeless tobacco  . Not on file    Problems Prior to Visit 1.   Current Problems (verified) Patient Active Problem List  Diagnoses  . MALIGNANT NEOPLASM OF PROSTATE---followed by dr. Laverle Patter  . HYPERLIPIDEMIA--tolerating meds  . HYPERTENSION--no trouble on meds  .   .   .     Medications Prior to Visit Current Outpatient Prescriptions on File Prior to Visit  Medication Sig Dispense Refill  . amLODipine (NORVASC) 10 MG tablet Take 0.5 tablets (5 mg total) by mouth daily.  90 tablet  3  . amLODipine (NORVASC) 10 MG tablet Take 1 tablet (10 mg total) by mouth daily.  90 tablet  3  . aspirin 81 MG tablet Take 81 mg by mouth daily.        . Calcium Carbonate-Vit D-Min (CALTRATE 600+D PLUS) 600-400 MG-UNIT per tablet Take 1 tablet by mouth daily.        . hydrochlorothiazide 25 MG tablet Take 1 tablet (25 mg total) by mouth daily.  90 tablet  3  . losartan (COZAAR) 100 MG tablet Take 1 tablet (100 mg total) by mouth daily.  90 tablet  3  . lovastatin (MEVACOR) 40 MG tablet Take 1 tablet (40 mg total) by mouth at bedtime.    90 tablet  3    Current Medications (verified) Current Outpatient Prescriptions  Medication Sig Dispense Refill  . amLODipine (NORVASC) 10 MG tablet Take 0.5 tablets (5 mg total) by mouth daily.  90 tablet  3  . amLODipine (NORVASC) 10 MG tablet Take 1 tablet (10 mg total) by mouth daily.  90 tablet  3  . aspirin 81 MG tablet Take 81 mg by mouth daily.        . Calcium Carbonate-Vit D-Min (CALTRATE 600+D PLUS) 600-400 MG-UNIT per tablet Take 1 tablet by mouth daily.        . hydrochlorothiazide 25 MG tablet Take 1 tablet (25 mg total) by mouth daily.  90 tablet  3  . losartan (COZAAR) 100 MG tablet Take 1 tablet (100 mg total) by mouth  daily.  90 tablet  3  . lovastatin (MEVACOR) 40 MG tablet Take 1 tablet (40 mg total) by mouth at bedtime.    90 tablet  3     Allergies (verified) Review of patient's allergies indicates no known allergies.   PAST HISTORY  Family History No family history on file.  Social History History  Substance Use Topics  . Smoking status: Former Games developer  . Smokeless tobacco: Not on file  . Alcohol Use:     Are there smokers in your home (other than you)?  No  Risk Factors Current exercise habits: exercises regularly at home: bicycle, weights  Dietary issues discussed: follows a great diet   Cardiac risk factors: advanced age (older than 24 for men, 42 for women), dyslipidemia and hypertension.  Depression Screen (Note: if answer to either of the following is "Yes", a more complete depression screening is indicated)   Q1: Over the past two weeks, have you felt down, depressed or hopeless? No  Q2: Over the past two weeks, have you felt little interest or pleasure in doing things? No   Activities of Daily Living In your present state of health, do you have any difficulty performing the  following activities?:  Driving? No Managing money?  No Feeding yourself? No  Hearing Difficulties: No Do you often ask people to speak up or repeat themselves? No Do you experience ringing or noises in your ears? No    Do you feel that you have a problem with memory? No  Do you often misplace items? No    Cognitive Testing  Alert? Yes  Normal Appearance?Yes     List the Names of Other Physician/Practitioners you currently use: 1.    Indicate any recent Medical Services you may have received from other than [facility] providers in the past year (date may be approximate).  Immunization History  Administered Date(s) Administered  . Pneumococcal Polysaccharide 11/07/2003  . Td 12/02/2008    Screening Tests Health Maintenance  Topic Date Due  . Zostavax  08/06/1985  . Influenza Vaccine   09/12/2011  . Colonoscopy  05/14/2017  . Tetanus/tdap  12/02/2018  . Pneumococcal Polysaccharide Vaccine Age 76 And Over  Completed    All answers were reviewed with the patient and necessary referrals were made:  Judie Petit, MD   02/03/2012   History reviewed: allergies, current medications, past family history, past medical history, past social history, past surgical history and problem list  Review of Systems  patient denies chest pain, shortness of breath, orthopnea. Denies lower extremity edema, abdominal pain, change in appetite, change in bowel movements. Patient denies rashes, musculoskeletal complaints. No other specific complaints in a complete review of systems.    Objective:      Blood pressure 126/68, pulse 71, temperature 98 F (36.7 C), temperature source Oral, weight 154 lb (69.854 kg), SpO2 95.00%. There is no height on file to calculate BMI.   well-developed well-nourished male in no acute distress. HEENT exam atraumatic, normocephalic, neck supple without jugular venous distention. Chest clear to auscultation cardiac exam S1-S2 are regular. Abdominal exam overweight with bowel sounds, soft and nontender. Extremities no edema. Neurologic exam is alert with a normal gait.      Assessment:     .well visit     Plan:     During the course of the visit the patient was educated and counseled about appropriate screening and preventive services including:    Pneumococcal vaccine   Influenza vaccine  Prostate cancer screening  Colorectal cancer screening  Diet review for nutrition referral? Yes ____  Not Indicated _x___   Patient Instructions (the written plan) was given to the patient.  Medicare Attestation I have personally reviewed: The patient's medical and social history Their use of alcohol, tobacco or illicit drugs Their current medications and supplements The patient's functional ability including ADLs,fall risks, home safety risks,  cognitive, and hearing and visual impairment Diet and physical activities Evidence for depression or mood disorders  The patient's weight, height, BMI, and visual acuity have been recorded in the chart.  I have made referrals, counseling, and provided education to the patient based on review of the above and I have provided the patient with a written personalized care plan for preventive services.     Judie Petit, MD   02/03/2012

## 2012-03-07 ENCOUNTER — Encounter: Payer: Self-pay | Admitting: Gastroenterology

## 2012-03-21 ENCOUNTER — Encounter: Payer: Self-pay | Admitting: Gastroenterology

## 2012-04-11 ENCOUNTER — Ambulatory Visit (INDEPENDENT_AMBULATORY_CARE_PROVIDER_SITE_OTHER): Payer: Medicare Other | Admitting: Gastroenterology

## 2012-04-11 ENCOUNTER — Encounter: Payer: Self-pay | Admitting: Gastroenterology

## 2012-04-11 VITALS — BP 158/60 | HR 80 | Ht 65.75 in | Wt 158.1 lb

## 2012-04-11 DIAGNOSIS — K5520 Angiodysplasia of colon without hemorrhage: Secondary | ICD-10-CM

## 2012-04-11 DIAGNOSIS — Z86010 Personal history of colonic polyps: Secondary | ICD-10-CM

## 2012-04-11 NOTE — Progress Notes (Signed)
History of Present Illness: This is an 78 year old male with prior history of multiple colon polyps. His last colonoscopy was June 2008 which did not show recurrent polyps however he did have a bleeding AVM which was treated. He has no gastrointestinal complaints. His health status is very good. Denies weight loss, abdominal pain, constipation, diarrhea, change in stool caliber, melena, hematochezia, nausea, vomiting, dysphagia, reflux symptoms, chest pain.   Review of Systems: Pertinent positive and negative review of systems were noted in the above HPI section. All other review of systems were otherwise negative.  Current Medications, Allergies, Past Medical History, Past Surgical History, Family History and Social History were reviewed in [employer] record.  Physical Exam: General: Well developed , well nourished, no acute distress Head: Normocephalic and atraumatic Eyes:  sclerae anicteric, EOMI Ears: Normal auditory acuity Mouth: No deformity or lesions Neck: Supple, no masses or thyromegaly Lungs: Clear throughout to auscultation Heart: Regular rate and rhythm; no murmurs, rubs or bruits Abdomen: Soft, non tender and non distended. No masses, hepatosplenomegaly or hernias noted. Normal Bowel sounds Musculoskeletal: Symmetrical with no gross deformities  Skin: No lesions on visible extremities Pulses:  Normal pulses noted Extremities: No clubbing, cyanosis, edema or deformities noted Neurological: Alert oriented x 4, grossly nonfocal Cervical Nodes:  No significant cervical adenopathy Inguinal Nodes: No significant inguinal adenopathy Psychological:  Alert and cooperative. Normal mood and affect  Assessment and Recommendations:  1. Personal history of multiple colon polyps. Given his age and the fact that his last colonoscopy was free of polyps I recommended to defer future surveillance colonoscopies and he agrees with this plan. I will see him on an as-needed  basis.  2. Colonic AVMs. No clinical evidence of bleeding at this time. Expectant management.  3. Diverticulosis. Long-term high fiber diet.

## 2012-04-17 ENCOUNTER — Telehealth: Payer: Self-pay | Admitting: *Deleted

## 2012-04-17 NOTE — Telephone Encounter (Signed)
Pt woke up this morning with a fluid on his elbow.  It has gone down some since this morning.  No pain.  He is going to call back tomorrow morning and let me know how it feels and schedule him appt with someone tomorrow if not any better

## 2012-04-17 NOTE — Telephone Encounter (Signed)
Pt would like to speak to [facility] re: the fluid on the pt's elbow, please.

## 2012-04-18 ENCOUNTER — Ambulatory Visit (INDEPENDENT_AMBULATORY_CARE_PROVIDER_SITE_OTHER): Payer: Medicare Other | Admitting: Family Medicine

## 2012-04-18 ENCOUNTER — Encounter: Payer: Self-pay | Admitting: Family Medicine

## 2012-04-18 VITALS — BP 170/80 | Temp 98.0°F | Wt 158.0 lb

## 2012-04-18 DIAGNOSIS — M7020 Olecranon bursitis, unspecified elbow: Secondary | ICD-10-CM

## 2012-04-18 DIAGNOSIS — M7021 Olecranon bursitis, right elbow: Secondary | ICD-10-CM

## 2012-04-18 NOTE — Patient Instructions (Signed)
Olecranon Bursitis  Bursitis is swelling and soreness (inflammation) of a fluid-filled sac (bursa) that covers and protects a joint. Olecranon bursitis occurs over the elbow.   CAUSES  Bursitis can be caused by injury, overuse of the joint, arthritis, or infection.   SYMPTOMS    Tenderness, swelling, warmth, or redness over the elbow.   Elbow pain with movement. This is greater with bending the elbow.   Squeaking sound when the bursa is rubbed or moved.   Increasing size of the bursa without pain or discomfort.   Fever with increasing pain and swelling if the bursa becomes infected.  HOME CARE INSTRUCTIONS    Put ice on the affected area.   Put ice in a plastic bag.   Place a towel between your skin and the bag.   Leave the ice on for 15 to 20 minutes each hour while awake. Do this for the first 2 days.   When resting, elevate your elbow above the level of your heart. This helps reduce swelling.   Continue to put the joint through a full range of motion 4 times per day. Rest the injured joint at other times. When the pain lessens, begin normal slow movements and usual activities.   Only take over-the-counter or prescription medicines for pain, discomfort, or fever as directed by your caregiver.   Reduce your intake of milk and related dairy products (cheese, yogurt). They may make your condition worse.  SEEK IMMEDIATE MEDICAL CARE IF:    Your pain increases even during treatment.   You have a fever.   You have heat and inflammation over the bursa and elbow.   You have a red line that goes up your arm.   You have pain with movement of your elbow.  MAKE SURE YOU:    Understand these instructions.   Will watch your condition.   Will get help right away if you are not doing well or get worse.  Document Released: 12/28/2006 Document Revised: 11/17/2011 Document Reviewed: 11/13/2007  [employer] Patient Information 2012 [employer].

## 2012-04-18 NOTE — Progress Notes (Signed)
  Subjective:    Patient ID: Andre [name], male    DOB: 1922/12/21, 78 y.o.   MRN: [id number]  HPI  Acute visit. Right elbow swelling for one day. Denies injury. No associated pain. No warmth or erythema. No history of similar process. Full range of motion right elbow. No aggravating or alleviating factors.  Has chronic past medical history significant for prostate cancer, hypertension, and hyperlipidemia. Compliant with blood pressure medications. No recent headaches or dizziness. No dyspnea. No chest pains.  Past Medical History  Diagnosis Date  . Adenomatous colon polyp 04/1985  . Hyperlipidemia   . Hypertension   . PAT (paroxysmal atrial tachycardia)   . Macular degeneration     bilateral  . Diverticulosis    Past Surgical History  Procedure Date  . Cataract extraction     bilateral  . Inguinal hernia repair   . Tendon repair 12/11    right leg    reports that he has quit smoking. He has never used smokeless tobacco. He reports that he drinks alcohol. His drug history not on file. family history is not on file. No Known Allergies   Review of Systems  Respiratory: Negative for shortness of breath.   Cardiovascular: Negative for chest pain.  Musculoskeletal: Negative for joint swelling and arthralgias.  Skin: Negative for rash.  Neurological: Negative for headaches.       Objective:   Physical Exam  Constitutional: He appears well-developed and well-nourished.  Cardiovascular: Normal rate and regular rhythm.   Pulmonary/Chest: Effort normal and breath sounds normal. No respiratory distress. He has no wheezes. He has no rales.  Musculoskeletal:       Right elbow reveals full range of motion. He has edema involving the olecranon bursa. No warmth or erythema. Nontender.          Assessment & Plan:  Right olecranon bursitis. Discussed risk and benefits of aspiration and patient consented. He is aware that these tend to recur frequently. Prep skin with  Betadine. Using #25-gauge 5/8 needle removed 5 cc of clear serosanguineous fluid. Injected 1 cc Depo-Medrol and into olecranon bursa sac. Patient tolerated well. Coban wrap applied. Followup promptly for any redness warmth or increased pain

## 2012-04-18 NOTE — Telephone Encounter (Signed)
Appt scheduled with Dr Caryl Never

## 2012-05-01 ENCOUNTER — Other Ambulatory Visit: Payer: Self-pay | Admitting: *Deleted

## 2012-05-01 MED ORDER — AMLODIPINE BESYLATE 10 MG PO TABS: 10.0000 mg | ORAL_TABLET | Freq: Every day | ORAL | Status: DC

## 2012-08-14 ENCOUNTER — Other Ambulatory Visit: Payer: Self-pay | Admitting: Dermatology

## 2012-12-07 ENCOUNTER — Other Ambulatory Visit: Payer: Self-pay | Admitting: Internal Medicine

## 2013-02-04 ENCOUNTER — Ambulatory Visit (INDEPENDENT_AMBULATORY_CARE_PROVIDER_SITE_OTHER): Payer: Medicare Other | Admitting: Internal Medicine

## 2013-02-04 VITALS — BP 154/64 | Temp 98.0°F | Ht 66.0 in | Wt 155.0 lb

## 2013-02-04 DIAGNOSIS — K5521 Angiodysplasia of colon with hemorrhage: Secondary | ICD-10-CM

## 2013-02-04 LAB — TSH: TSH: 1 u[IU]/mL (ref 0.35–5.50)

## 2013-02-04 LAB — HEPATIC FUNCTION PANEL
Bilirubin, Direct: 0.1 mg/dL (ref 0.0–0.3)
Total Bilirubin: 0.8 mg/dL (ref 0.3–1.2)
Total Protein: 7.5 g/dL (ref 6.0–8.3)

## 2013-02-04 LAB — CBC WITH DIFFERENTIAL/PLATELET
Basophils Relative: 0.4 % (ref 0.0–3.0)
Eosinophils Relative: 3.6 % (ref 0.0–5.0)
HCT: 42.2 % (ref 39.0–52.0)
Hemoglobin: 14.3 g/dL (ref 13.0–17.0)
Lymphs Abs: 2 10*3/uL (ref 0.7–4.0)
MCV: 94.1 fl (ref 78.0–100.0)
Monocytes Absolute: 0.7 10*3/uL (ref 0.1–1.0)
Neutro Abs: 5.4 10*3/uL (ref 1.4–7.7)
Platelets: 252 10*3/uL (ref 150.0–400.0)
WBC: 8.5 10*3/uL (ref 4.5–10.5)

## 2013-02-04 LAB — BASIC METABOLIC PANEL
BUN: 22 mg/dL (ref 6–23)
Chloride: 102 mEq/L (ref 96–112)
Potassium: 3.4 mEq/L — ABNORMAL LOW (ref 3.5–5.1)

## 2013-02-04 LAB — LIPID PANEL
Cholesterol: 170 mg/dL (ref 0–200)
LDL Cholesterol: 89 mg/dL (ref 0–99)
VLDL: 24.8 mg/dL (ref 0.0–40.0)

## 2013-02-04 MED ORDER — HYDROCHLOROTHIAZIDE 25 MG PO TABS: 25.0000 mg | ORAL_TABLET | Freq: Every day | ORAL | Status: DC

## 2013-02-04 NOTE — Assessment & Plan Note (Signed)
Continue same meds

## 2013-02-04 NOTE — Assessment & Plan Note (Signed)
Tolerating meds Check labs today

## 2013-02-04 NOTE — Progress Notes (Signed)
Patient ID: Andre [name], male   DOB: May 11, 1923, 79 y.o.   MRN: [id number] Pt here for f/u htn- tolerating meds without difficulty  Lipids- tolerating meds  He is feeling well and continues to exercise regularly. (rides stationary bike, lifts weights.)  Past Medical History  Diagnosis Date  . Adenomatous colon polyp 04/1985  . Hyperlipidemia   . Hypertension   . PAT (paroxysmal atrial tachycardia)   . Macular degeneration     bilateral  . Diverticulosis     History   Social History  . Marital Status: Married    Spouse Name: N/A    Number of Children: 5  . Years of Education: N/A   Occupational History  . RETIRED    Social History Main Topics  . Smoking status: Former Games developer  . Smokeless tobacco: Never Used  . Alcohol Use: Yes     Comment: 2 glass of wine a day  . Drug Use: Not on file  . Sexually Active: Not on file   Other Topics Concern  . Not on file   Social History Narrative  . No narrative on file    Past Surgical History  Procedure Laterality Date  . Cataract extraction      bilateral  . Inguinal hernia repair    . Tendon repair  12/11    right leg    No family history on file.  No Known Allergies  Current Outpatient Prescriptions on File Prior to Visit  Medication Sig Dispense Refill  . amLODipine (NORVASC) 10 MG tablet Take 1 tablet by mouth  every day  90 tablet  0  . aspirin 81 MG tablet Take 81 mg by mouth daily.        . Calcium Carbonate-Vit D-Min (CALTRATE 600+D PLUS) 600-400 MG-UNIT per tablet Take 1 tablet by mouth daily.        . hydrochlorothiazide (HYDRODIURIL) 25 MG tablet Take 1 tablet (25 mg total) by mouth daily.  90 tablet  3  . losartan (COZAAR) 100 MG tablet Take 1 tablet by mouth  daily  90 tablet  0  . lovastatin (MEVACOR) 40 MG tablet Take 1 tablet by mouth  every night at bedtime  90 tablet  0  . Multiple Vitamins-Minerals (ABC PLUS SENIOR) TABS Take 1 tablet by mouth daily.       No current facility-administered  medications on file prior to visit.     patient denies chest pain, shortness of breath, orthopnea. Denies lower extremity edema, abdominal pain, change in appetite, change in bowel movements. Patient denies rashes, musculoskeletal complaints. No other specific complaints in a complete review of systems.   There were no vitals taken for this visit.  well-developed well-nourished male in no acute distress. HEENT exam atraumatic, normocephalic, neck supple without jugular venous distention. Chest clear to auscultation cardiac exam S1-S2 are regular. Abdominal exam overweight with bowel sounds, soft and nontender. Extremities no edema. Neurologic exam is alert with a normal gait.

## 2013-02-04 NOTE — Assessment & Plan Note (Signed)
Needs f/u cbc.  

## 2013-02-06 ENCOUNTER — Other Ambulatory Visit: Payer: Self-pay | Admitting: *Deleted

## 2013-02-12 ENCOUNTER — Other Ambulatory Visit: Payer: Self-pay | Admitting: Dermatology

## 2013-03-05 ENCOUNTER — Telehealth: Payer: Self-pay | Admitting: Internal Medicine

## 2013-03-05 NOTE — Telephone Encounter (Signed)
Pt bp   on 3-21 was 153/63 and Sunday 3-23 149/65 and Monday 3-24 154/56. Please advise.

## 2013-03-05 NOTE — Telephone Encounter (Signed)
bp is not perfect but ok for age.  Have him continue bp check once weekly. Call for bp consistently above 160/90

## 2013-03-06 ENCOUNTER — Other Ambulatory Visit: Payer: Self-pay | Admitting: Internal Medicine

## 2013-03-06 MED ORDER — AMLODIPINE BESYLATE 10 MG PO TABS: ORAL_TABLET | ORAL | Status: DC

## 2013-03-06 NOTE — Telephone Encounter (Signed)
Pt aware.

## 2013-03-11 ENCOUNTER — Ambulatory Visit (INDEPENDENT_AMBULATORY_CARE_PROVIDER_SITE_OTHER): Payer: Medicare Other | Admitting: Emergency Medicine

## 2013-03-11 VITALS — BP 172/64 | HR 67 | Temp 97.5°F | Resp 16 | Ht 65.5 in | Wt 156.2 lb

## 2013-03-11 DIAGNOSIS — H6120 Impacted cerumen, unspecified ear: Secondary | ICD-10-CM

## 2013-03-11 DIAGNOSIS — H6123 Impacted cerumen, bilateral: Secondary | ICD-10-CM

## 2013-03-11 NOTE — Progress Notes (Signed)
[facility] [address] [place] [address] [phone]  Date:  03/11/2013   Name:  Andre [name]   DOB:  [birth year]   MRN:  [id number]  PCP:  Judie Petit, MD    Chief Complaint: Ear Fullness   History of Present Illness:  Andre [name] is a 79 y.o. very pleasant male patient who presents with the following:  No history of hypertension and BP is a bit elevated today both here and at home.  Developed sudden hearing loss in left ear.  Denies any antecedent illness, cough, fever or chills, coryza, or other complaint.  No improvement with over the counter medications or other home remedies.   Patient Active Problem List  Diagnosis  . MALIGNANT NEOPLASM OF PROSTATE  . HYPERLIPIDEMIA  . HYPERTENSION  . DIVERTICULOSIS, COLON  . Angiodysplasia of intestine with hemorrhage  . COLONIC POLYPS, HX OF    Past Medical History  Diagnosis Date  . Adenomatous colon polyp 04/1985  . Hyperlipidemia   . Hypertension   . PAT (paroxysmal atrial tachycardia)   . Macular degeneration     bilateral  . Diverticulosis     Past Surgical History  Procedure Laterality Date  . Cataract extraction      bilateral  . Inguinal hernia repair    . Tendon repair  12/11    right leg    History  Substance Use Topics  . Smoking status: Former Games developer  . Smokeless tobacco: Never Used  . Alcohol Use: Yes     Comment: 2 glass of wine a day    Family History  Problem Relation Age of Onset  . Hypertension Mother   . Cancer Father     lung    No Known Allergies  Medication list has been reviewed and updated.  Current Outpatient Prescriptions on File Prior to Visit  Medication Sig Dispense Refill  . amLODipine (NORVASC) 10 MG tablet Take 1 tablet by mouth  every day  90 tablet  1  . aspirin 81 MG tablet Take 81 mg by mouth daily.        . Calcium Carbonate-Vit D-Min (CALTRATE 600+D PLUS) 600-400 MG-UNIT per tablet Take 1 tablet by mouth daily.         Marland Kitchen losartan (COZAAR) 100 MG tablet Take 1 tablet by mouth   daily  90 tablet  1  . lovastatin (MEVACOR) 40 MG tablet Take 1 tablet by mouth   every night at bedtime  90 tablet  1  . Multiple Vitamins-Minerals (ABC PLUS SENIOR) TABS Take 1 tablet by mouth daily.       No current facility-administered medications on file prior to visit.    Review of Systems:  As per HPI, otherwise negative.    Physical Examination: Filed Vitals:   03/11/13 1307  BP: 172/64  Pulse: 67  Temp: 97.5 F (36.4 C)  Resp: 16   Filed Vitals:   03/11/13 1307  Height: 5' 5.5" (1.664 m)  Weight: 156 lb 3.2 oz (70.852 kg)   Body mass index is 25.59 kg/(m^2). Ideal Body Weight: Weight in (lb) to have BMI = 25: 152.2   GEN: WDWN, NAD, Non-toxic, Alert & Oriented x 3 HEENT: Atraumatic, Normocephalic.  Ears and Nose: No external deformity.  Bilateral cerumen impaction EXTR: No clubbing/cyanosis/edema NEURO: Normal gait.  PSYCH: Normally interactive. Conversant. Not depressed or anxious appearing.  Calm demeanor.    Assessment and Plan: Cerumen impaction Irrigated free   Signed,  Ellison Carwin, MD

## 2013-03-11 NOTE — Patient Instructions (Addendum)
Cerumen Impaction  A cerumen impaction is when the wax in your ear forms a plug. This plug usually causes reduced hearing. Sometimes it also causes an earache or dizziness. Removing a cerumen impaction can be difficult and painful. The wax sticks to the ear canal. The canal is sensitive and bleeds easily. If you try to remove a heavy wax buildup with a cotton tipped swab, you may push it in further.  Irrigation with water, suction, and small ear curettes may be used to clear out the wax. If the impaction is fixed to the skin in the ear canal, ear drops may be needed for a few days to loosen the wax. People who build up a lot of wax frequently can use ear wax removal products available in your local drugstore.  SEEK MEDICAL CARE IF:    You develop an earache, increased hearing loss, or marked dizziness.  Document Released: 01/05/2005 Document Revised: 02/20/2012 Document Reviewed: 02/25/2010  ExitCare Patient Information 2013 [employer].

## 2013-03-15 ENCOUNTER — Telehealth: Payer: Self-pay | Admitting: Internal Medicine

## 2013-03-15 NOTE — Telephone Encounter (Signed)
Pt received letter from [facility].  Asked that pt get in touch w/ PCP.  He wants to know if you know what this is concerning.  Pls advise.

## 2013-03-15 NOTE — Telephone Encounter (Signed)
Unsure as to why [facility] told pt to contact us.  Per pt I took of refill last week.  Pt thinks that [facility] is confused. Pt does not anything else

## 2013-07-08 ENCOUNTER — Ambulatory Visit (INDEPENDENT_AMBULATORY_CARE_PROVIDER_SITE_OTHER): Payer: Medicare Other | Admitting: Emergency Medicine

## 2013-07-08 DIAGNOSIS — L02511 Cutaneous abscess of right hand: Secondary | ICD-10-CM

## 2013-07-08 DIAGNOSIS — M79644 Pain in right finger(s): Secondary | ICD-10-CM

## 2013-07-08 DIAGNOSIS — IMO0002 Reserved for concepts with insufficient information to code with codable children: Secondary | ICD-10-CM

## 2013-07-08 DIAGNOSIS — M79609 Pain in unspecified limb: Secondary | ICD-10-CM

## 2013-07-08 MED ORDER — SULFAMETHOXAZOLE-TRIMETHOPRIM 800-160 MG PO TABS: 1.0000 | ORAL_TABLET | Freq: Two times a day (BID) | ORAL | Status: DC

## 2013-07-08 NOTE — Progress Notes (Signed)
Patient ID: ANSEL FERRALL MRN: [id number], DOB: 11/18/1923, 79 y.o. Date of Encounter: 07/08/2013, 1:29 PM    PROCEDURE NOTE: Verbal consent obtained. Betadine prep per usual protocol. Local anesthesia obtained with metacarpal block, 2% lidocaine plain.   0.5 cm incision made with 11 blade along lesion.  Culture taken. Moderate amount purulence expressed. Lesion explored revealing no loculations. Not packed.  Dressed. Wound care instructions including precautions with patient. Patient tolerated the procedure well. Recheck as needed.      Grier Mitts, PA-C 07/08/2013 1:29 PM

## 2013-07-08 NOTE — Progress Notes (Signed)
[facility] [address], [place] [address] [phone]  Date:  07/08/2013   Name:  Andre [name]   DOB:  Jul 03, 1923   MRN:  [id number]  PCP:  Judie Petit, MD    Chief Complaint: No chief complaint on file.   History of Present Illness:  Andre [name] is a 79 y.o. very pleasant male patient who presents with the following:  At the beach last weekend and stuck a splinter in his right third finger.  Removed a portion.  Went to [facility] for a tetanus shot and was put on cipro.  Since has had another portion of the splinter removed.  Has felon in third finger.  No fever or chills.  Denies other complaint or health concern today.   Patient Active Problem List   Diagnosis Date Noted  . MALIGNANT NEOPLASM OF PROSTATE 01/05/2010  . HYPERLIPIDEMIA 12/04/2007  . HYPERTENSION 12/04/2007  . COLONIC POLYPS, HX OF 12/04/2007  . DIVERTICULOSIS, COLON 05/15/2007  . Angiodysplasia of intestine with hemorrhage 05/15/2007    Past Medical History  Diagnosis Date  . Adenomatous colon polyp 04/1985  . Hyperlipidemia   . Hypertension   . PAT (paroxysmal atrial tachycardia)   . Macular degeneration     bilateral  . Diverticulosis     Past Surgical History  Procedure Laterality Date  . Cataract extraction      bilateral  . Inguinal hernia repair    . Tendon repair  12/11    right leg    History  Substance Use Topics  . Smoking status: Former Games developer  . Smokeless tobacco: Never Used  . Alcohol Use: Yes     Comment: 2 glass of wine a day    Family History  Problem Relation Age of Onset  . Hypertension Mother   . Cancer Father     lung    No Known Allergies  Medication list has been reviewed and updated.  Current Outpatient Prescriptions on File Prior to Visit  Medication Sig Dispense Refill  . amLODipine (NORVASC) 10 MG tablet Take 1 tablet by mouth  every day  90 tablet  1  . aspirin 81 MG tablet Take 81 mg by mouth daily.         . Calcium Carbonate-Vit D-Min (CALTRATE 600+D PLUS) 600-400 MG-UNIT per tablet Take 1 tablet by mouth daily.        Marland Kitchen losartan (COZAAR) 100 MG tablet Take 1 tablet by mouth   daily  90 tablet  1  . lovastatin (MEVACOR) 40 MG tablet Take 1 tablet by mouth   every night at bedtime  90 tablet  1  . Multiple Vitamins-Minerals (ABC PLUS SENIOR) TABS Take 1 tablet by mouth daily.       No current facility-administered medications on file prior to visit.    Review of Systems:  As per HPI, otherwise negative.    Physical Examination: There were no vitals filed for this visit. There were no vitals filed for this visit. There is no weight on file to calculate BMI. Ideal Body Weight:     GEN: WDWN, NAD, Non-toxic, Alert & Oriented x 3 HEENT: Atraumatic, Normocephalic.  Ears and Nose: No external deformity. EXTR: No clubbing/cyanosis/edema NEURO: Normal gait.  PSYCH: Normally interactive. Conversant. Not depressed or anxious appearing.  Calm demeanor.  RIGHT HAND:  Felon third finger   Assessment and Plan: Septra Warm soaks Follow up as needed I&D.   Signed,  Phillips Odor,  MD

## 2013-07-11 LAB — WOUND CULTURE: Gram Stain: NONE SEEN

## 2013-08-15 ENCOUNTER — Other Ambulatory Visit: Payer: Self-pay | Admitting: Internal Medicine

## 2013-08-20 ENCOUNTER — Other Ambulatory Visit: Payer: Self-pay | Admitting: Dermatology

## 2013-12-26 ENCOUNTER — Other Ambulatory Visit: Payer: Self-pay | Admitting: Dermatology

## 2014-02-05 ENCOUNTER — Encounter: Payer: Medicare Other | Admitting: Internal Medicine

## 2014-02-14 ENCOUNTER — Encounter: Payer: Self-pay | Admitting: Internal Medicine

## 2014-02-14 ENCOUNTER — Ambulatory Visit (INDEPENDENT_AMBULATORY_CARE_PROVIDER_SITE_OTHER): Payer: Medicare Other | Admitting: Internal Medicine

## 2014-02-14 VITALS — BP 160/62 | HR 76 | Temp 97.6°F | Ht 65.5 in | Wt 156.0 lb

## 2014-02-14 DIAGNOSIS — C61 Malignant neoplasm of prostate: Secondary | ICD-10-CM

## 2014-02-14 DIAGNOSIS — K5521 Angiodysplasia of colon with hemorrhage: Secondary | ICD-10-CM

## 2014-02-14 DIAGNOSIS — I10 Essential (primary) hypertension: Secondary | ICD-10-CM

## 2014-02-14 DIAGNOSIS — E785 Hyperlipidemia, unspecified: Secondary | ICD-10-CM

## 2014-02-14 LAB — BASIC METABOLIC PANEL
BUN: 21 mg/dL (ref 6–23)
CALCIUM: 9.3 mg/dL (ref 8.4–10.5)
CHLORIDE: 106 meq/L (ref 96–112)
CO2: 28 mEq/L (ref 19–32)
CREATININE: 1.3 mg/dL (ref 0.4–1.5)
GFR: 57.34 mL/min — ABNORMAL LOW (ref >60.00)
GLUCOSE: 105 mg/dL — AB (ref 70–99)
Potassium: 4.1 mEq/L (ref 3.5–5.1)
Sodium: 140 mEq/L (ref 135–145)

## 2014-02-14 LAB — HEPATIC FUNCTION PANEL
ALBUMIN: 3.9 g/dL (ref 3.5–5.2)
ALK PHOS: 50 U/L (ref 39–117)
ALT: 17 U/L (ref 0–53)
AST: 19 U/L (ref 0–37)
Bilirubin, Direct: 0.1 mg/dL (ref 0.0–0.3)
TOTAL PROTEIN: 7.4 g/dL (ref 6.0–8.3)
Total Bilirubin: 0.8 mg/dL (ref 0.3–1.2)

## 2014-02-14 LAB — CBC WITH DIFFERENTIAL/PLATELET
BASOS ABS: 0 10*3/uL (ref 0.0–0.1)
Basophils Relative: 0.3 % (ref 0.0–3.0)
EOS ABS: 0.1 10*3/uL (ref 0.0–0.7)
Eosinophils Relative: 1.5 % (ref 0.0–5.0)
HCT: 44.8 % (ref 39.0–52.0)
Hemoglobin: 14.9 g/dL (ref 13.0–17.0)
LYMPHS PCT: 25.4 % (ref 12.0–46.0)
Lymphs Abs: 2.1 10*3/uL (ref 0.7–4.0)
MCHC: 33.3 g/dL (ref 30.0–36.0)
MCV: 96 fl (ref 78.0–100.0)
Monocytes Absolute: 0.6 10*3/uL (ref 0.1–1.0)
Monocytes Relative: 7.1 % (ref 3.0–12.0)
Neutro Abs: 5.5 10*3/uL (ref 1.4–7.7)
Neutrophils Relative %: 65.7 % (ref 43.0–77.0)
Platelets: 259 10*3/uL (ref 150.0–400.0)
RBC: 4.67 Mil/uL (ref 4.22–5.81)
RDW: 13.4 % (ref 11.5–14.6)
WBC: 8.4 10*3/uL (ref 4.5–10.5)

## 2014-02-14 LAB — LIPID PANEL
CHOLESTEROL: 156 mg/dL (ref 0–200)
HDL: 56.7 mg/dL (ref >39.00)
LDL Cholesterol: 79 mg/dL (ref 0–99)
TRIGLYCERIDES: 101 mg/dL (ref 0.0–149.0)
Total CHOL/HDL Ratio: 3
VLDL: 20.2 mg/dL (ref 0.0–40.0)

## 2014-02-14 LAB — TSH: TSH: 1.06 u[IU]/mL (ref 0.35–5.50)

## 2014-02-14 MED ORDER — LOSARTAN POTASSIUM 100 MG PO TABS: 100.0000 mg | ORAL_TABLET | Freq: Every day | ORAL | Status: DC

## 2014-02-14 MED ORDER — AMLODIPINE BESYLATE 10 MG PO TABS: 10.0000 mg | ORAL_TABLET | Freq: Every day | ORAL | Status: DC

## 2014-02-14 MED ORDER — DOXYCYCLINE HYCLATE 100 MG PO TABS: 100.0000 mg | ORAL_TABLET | Freq: Two times a day (BID) | ORAL | Status: AC

## 2014-02-14 MED ORDER — LOVASTATIN 40 MG PO TABS: 40.0000 mg | ORAL_TABLET | Freq: Every day | ORAL | Status: DC

## 2014-02-14 NOTE — Progress Notes (Signed)
Pre visit review using our clinic review tool, if applicable. No additional management support is needed unless otherwise documented below in the visit note. 

## 2014-02-14 NOTE — Progress Notes (Signed)
htn- tolerating meds (home bps 140-150/60s)  Recent [facility]- Moh's procedure  Prostate CA- has regular f/u with urolgoy  Lipids- tolerating meds  He continues to exercise regularly (cycling and light weights).  Past Medical History  Diagnosis Date  . Adenomatous colon polyp 04/1985  . Hyperlipidemia   . Hypertension   . PAT (paroxysmal atrial tachycardia)   . Macular degeneration     bilateral  . Diverticulosis     History   Social History  . Marital Status: Married    Spouse Name: N/A    Number of Children: 84  . Years of Education: N/A   Occupational History  . RETIRED    Social History Main Topics  . Smoking status: Former Research scientist (life sciences)  . Smokeless tobacco: Never Used  . Alcohol Use: Yes     Comment: 2 glass of wine a day  . Drug Use: Not on file  . Sexual Activity: Not on file   Other Topics Concern  . Not on file   Social History Narrative  . No narrative on file    Past Surgical History  Procedure Laterality Date  . Cataract extraction      bilateral  . Inguinal hernia repair    . Tendon repair  12/11    right leg    Family History  Problem Relation Age of Onset  . Hypertension Mother   . Cancer Father     lung    No Known Allergies  Current Outpatient Prescriptions on File Prior to Visit  Medication Sig Dispense Refill  . amLODipine (NORVASC) 10 MG tablet Take 1 tablet by mouth  every day  90 tablet  1  . aspirin 81 MG tablet Take 81 mg by mouth daily.        . Calcium Carbonate-Vit D-Min (CALTRATE 600+D PLUS) 600-400 MG-UNIT per tablet Take 1 tablet by mouth daily.        Marland Kitchen losartan (COZAAR) 100 MG tablet Take 1 tablet by mouth  daily  90 tablet  1  . lovastatin (MEVACOR) 40 MG tablet Take 1 tablet by mouth  every night at bedtime  90 tablet  1  . Multiple Vitamins-Minerals (ABC PLUS SENIOR) TABS Take 1 tablet by mouth daily.       No current facility-administered medications on file prior to visit.     patient denies chest pain, shortness of  breath, orthopnea. Denies lower extremity edema, abdominal pain, change in appetite, change in bowel movements. Patient denies rashes, musculoskeletal complaints. No other specific complaints in a complete review of systems.   BP 160/62  Pulse 76  Temp(Src) 97.6 F (36.4 C) (Oral)  Ht 5' 5.5" (1.664 m)  Wt 156 lb (70.761 kg)  BMI 25.56 kg/m2  well-developed well-nourished male in no acute distress. HEENT exam atraumatic, normocephalic, neck supple without jugular venous distention. Chest clear to auscultation cardiac exam S1-S2 are regular. Abdominal exam overweight with bowel sounds, soft and nontender. Extremities no edema. Neurologic exam is alert with a normal gait.  MALIGNANT NEOPLASM OF PROSTATE He has regular f/u with urology.  I will let them monitor labs as they see fit  HYPERTENSION BP Readings from Last 3 Encounters:  02/14/14 160/62  03/11/13 172/64  02/04/13 154/64   i rechecked bp at 145/88. Adequate control given his remarkable health and age   80 Lipid Panel     Component Value Date/Time   CHOL 156 02/14/2014 0949   TRIG 101.0 02/14/2014 0949   HDL 56.70 02/14/2014 0949  CHOLHDL 3 02/14/2014 0949   VLDL 20.2 02/14/2014 0949   LDLCALC 79 02/14/2014 0949  well controlled- continue same meds    Angiodysplasia of intestine with hemorrhage No recurrent bleeding. No further evaluation at this time

## 2014-02-15 ENCOUNTER — Telehealth: Payer: Self-pay | Admitting: Internal Medicine

## 2014-02-15 NOTE — Assessment & Plan Note (Signed)
Lipid Panel     Component Value Date/Time   CHOL 156 02/14/2014 0949   TRIG 101.0 02/14/2014 0949   HDL 56.70 02/14/2014 0949   CHOLHDL 3 02/14/2014 0949   VLDL 20.2 02/14/2014 0949   LDLCALC 79 02/14/2014 0949  well controlled- continue same meds

## 2014-02-15 NOTE — Telephone Encounter (Signed)
Relevant patient education mailed to patient.  

## 2014-02-15 NOTE — Assessment & Plan Note (Signed)
BP Readings from Last 3 Encounters:  02/14/14 160/62  03/11/13 172/64  02/04/13 154/64   i rechecked bp at 145/88. Adequate control given his remarkable health and age

## 2014-02-15 NOTE — Assessment & Plan Note (Signed)
No recurrent bleeding. No further evaluation at this time

## 2014-02-15 NOTE — Assessment & Plan Note (Signed)
He has regular f/u with urology.  I will let them monitor labs as they see fit

## 2014-02-17 ENCOUNTER — Telehealth: Payer: Self-pay | Admitting: Internal Medicine

## 2014-02-17 NOTE — Telephone Encounter (Signed)
Relevant patient education mailed to patient.  

## 2014-05-01 ENCOUNTER — Ambulatory Visit (INDEPENDENT_AMBULATORY_CARE_PROVIDER_SITE_OTHER): Payer: Medicare Other | Admitting: Family Medicine

## 2014-05-01 VITALS — BP 144/52 | HR 72 | Temp 98.0°F | Resp 16 | Ht 65.5 in | Wt 159.0 lb

## 2014-05-01 DIAGNOSIS — M79642 Pain in left hand: Secondary | ICD-10-CM

## 2014-05-01 DIAGNOSIS — S61409A Unspecified open wound of unspecified hand, initial encounter: Secondary | ICD-10-CM

## 2014-05-01 DIAGNOSIS — M79609 Pain in unspecified limb: Secondary | ICD-10-CM

## 2014-05-01 DIAGNOSIS — S61412A Laceration without foreign body of left hand, initial encounter: Secondary | ICD-10-CM

## 2014-05-01 NOTE — Progress Notes (Addendum)
This chart was scribed for Wardell Honour, MD by Allena Earing, ED Scribe. This patient was seen in room 06 and the patient's care was started at 6:00 PM .  Subjective:    Patient ID: Andre [name], male    DOB: 01-11-1923, 80 y.o.   MRN: [id number]  Laceration     HPI Comments: Andre [name] is a 80 y.o. male who presents to the [facility] complaining of a cut to his left hand that happened around 2:30 PM. He reports that he has not had such cuts before, "its like my skin peeled back".Pt was in [place] when he cut his hand. He cut it on a door and states that "my skin is thin due to the medicines I take" and "it just ripped the skin". He reports that the hand was still bleeding upon arrival. Pt states that he cannot move his hand too much "without it looking like it will fall apart". Pt takes baby aspirin. Pt is very active.  Pt is right handed.   Last reported tetanus in 2009, he says in July 2014.  Review of Systems  Musculoskeletal: Negative for myalgias, back pain, arthralgias, neck pain and neck stiffness.  Skin: Positive for wound (left hand).  Neurological: Negative for weakness and numbness.  Psychiatric/Behavioral: Negative for suicidal ideas, hallucinations, confusion, sleep disturbance, self-injury, dysphoric mood, decreased concentration and agitation. The patient is not nervous/anxious and is not hyperactive.    No Known Allergies     Objective:   Physical Exam  Constitutional: He is oriented to person, place, and time. He appears well-developed.  HENT:  Head: Normocephalic.  Eyes: Conjunctivae and EOM are normal. No scleral icterus.  Neck: Neck supple. No thyromegaly present.  Cardiovascular: Normal rate and regular rhythm.  Exam reveals no gallop and no friction rub.   No murmur heard. Pulmonary/Chest: No stridor. He has no wheezes. He has no rales. He exhibits no tenderness.  Abdominal: He exhibits no distension. There is no  tenderness. There is no rebound.  Musculoskeletal: Normal range of motion. He exhibits no edema.  Lymphadenopathy:    He has no cervical adenopathy.  Neurological: He is oriented to person, place, and time. He exhibits normal muscle tone. Coordination normal.  Skin: No rash noted. No erythema.  Superficial laceration/skin tear along left hand, about 7cm x 1cm. +elliptical shaped W/o active bleeding.  Psychiatric: He has a normal mood and affect. His behavior is normal.  Nursing note and vitals reviewed.   Filed Vitals:   05/01/14 1746  BP: 144/52  Pulse: 72  Temp: 98 F (36.7 C)  Resp: 16   PROCEDURE: WOUND IRRIGATED AND CLEANSED WITH SOAP AND WATER; STERI STRIPS APPLIED TO APPROXIMATE SKIN TEAR.  PT TOLERATED WELL.  BANDAGE APPLIED.     Assessment & Plan:  Pain of left hand  Laceration of hand, left   1. Pain L hand: New. Secondary to laceration; recommend Tylenol PRN. 2.  Laceration/skin tear of L hand: New.  Tetanus UTD. S/p irrigation of wound during visit; application of steristrips applied to approximate wound. Local wound care.  RTC s/s of infection; keep wound clean and covered during the day.   No orders of the defined types were placed in this encounter.      I personally performed the services described in this documentation, which was scribed in my presence. The recorded information has been reviewed and is accurate.  Reginia Forts, M.D.  [facility]  [address], [place]  [address] [phone] phone [phone] fax

## 2014-05-08 ENCOUNTER — Ambulatory Visit (INDEPENDENT_AMBULATORY_CARE_PROVIDER_SITE_OTHER): Payer: Medicare Other | Admitting: Physician Assistant

## 2014-05-08 ENCOUNTER — Ambulatory Visit: Payer: Medicare Other

## 2014-05-08 VITALS — BP 122/60 | HR 87 | Temp 98.2°F | Resp 18 | Ht 65.5 in | Wt 159.0 lb

## 2014-05-08 DIAGNOSIS — M79643 Pain in unspecified hand: Secondary | ICD-10-CM

## 2014-05-08 DIAGNOSIS — T148XXA Other injury of unspecified body region, initial encounter: Secondary | ICD-10-CM

## 2014-05-08 DIAGNOSIS — M79609 Pain in unspecified limb: Secondary | ICD-10-CM

## 2014-05-08 DIAGNOSIS — IMO0002 Reserved for concepts with insufficient information to code with codable children: Secondary | ICD-10-CM

## 2014-05-08 DIAGNOSIS — L089 Local infection of the skin and subcutaneous tissue, unspecified: Secondary | ICD-10-CM

## 2014-05-08 LAB — POCT CBC
GRANULOCYTE PERCENT: 65.8 % (ref 37–80)
HCT, POC: 43.7 % (ref 43.5–53.7)
Hemoglobin: 14.2 g/dL (ref 14.1–18.1)
Lymph, poc: 1.9 (ref 0.6–3.4)
MCH, POC: 31.5 pg — AB (ref 27–31.2)
MCHC: 32.5 g/dL (ref 31.8–35.4)
MCV: 96.9 fL (ref 80–97)
MID (cbc): 0.5 (ref 0–0.9)
MPV: 8.9 fL (ref 0–99.8)
POC GRANULOCYTE: 4.5 (ref 2–6.9)
POC LYMPH %: 27.1 % (ref 10–50)
POC MID %: 7.1 % (ref 0–12)
Platelet Count, POC: 286 10*3/uL (ref 142–424)
RBC: 4.51 M/uL — AB (ref 4.69–6.13)
RDW, POC: 14.5 %
WBC: 6.9 10*3/uL (ref 4.6–10.2)

## 2014-05-08 MED ORDER — AMOXICILLIN-POT CLAVULANATE 875-125 MG PO TABS: 1.0000 | ORAL_TABLET | Freq: Two times a day (BID) | ORAL | Status: DC

## 2014-05-08 MED ORDER — CEFTRIAXONE SODIUM 1 G IJ SOLR
1.0000 g | Freq: Once | INTRAMUSCULAR | Status: AC
  Administered 2014-05-08: 1 g via INTRAMUSCULAR

## 2014-05-08 NOTE — Progress Notes (Signed)
Patient ID: TAIT BALISTRERI MRN: [id number], DOB: June 28, 1923 80 y.o. Date of Encounter: 05/08/2014, 12:42 PM  Primary Physician: Chancy Hurter, MD  Chief Complaint: Wound check    See note from 05/01/14  HPI: 80 y.o. male here for recheck of injury to dorsal aspect of left hand that occurred on 05/01/14. Patient initially cut hand on car door in [place], [place] 3 hours away. Dressed wound and had someone drive him here for evaluation. Wound was washed thoroughly, irrigated, skin approximated, and steri strips were applied. Notes waxing and waning swelling distal to the wound, not over the wound itself. Some erythema. He is uncertain about drainage.  Afebrile/ No chills. No pain. Able to move hand without difficulty. Normal sensation. He tried not to get the wound wet in the shower.    Past Medical History  Diagnosis Date  . Adenomatous colon polyp 04/1985  . Hyperlipidemia   . Hypertension   . PAT (paroxysmal atrial tachycardia)   . Macular degeneration     bilateral  . Diverticulosis      Home Meds: Prior to Admission medications   Medication Sig Start Date End Date Taking? Authorizing Provider  amLODipine (NORVASC) 10 MG tablet Take 1 tablet (10 mg total) by mouth daily. 02/14/14  Yes Lisabeth Pick, MD  aspirin 81 MG tablet Take 81 mg by mouth daily.     Yes Historical Provider, MD  Calcium Carbonate-Vit D-Min (CALTRATE 600+D PLUS) 600-400 MG-UNIT per tablet Take 1 tablet by mouth daily.     Yes Historical Provider, MD  losartan (COZAAR) 100 MG tablet Take 1 tablet (100 mg total) by mouth daily. 02/14/14  Yes Lisabeth Pick, MD  lovastatin (MEVACOR) 40 MG tablet Take 1 tablet (40 mg total) by mouth at bedtime. 02/14/14  Yes Lisabeth Pick, MD  Multiple Vitamins-Minerals (ABC PLUS SENIOR) TABS Take 1 tablet by mouth daily.   Yes Historical Provider, MD    Allergies: No Known Allergies  ROS: Per above.    Physical Exam: Blood pressure 122/60, pulse 87, temperature 98.2 F  (36.8 C), temperature source Oral, resp. rate 18, height 5' 5.5" (1.664 m), weight 159 lb (72.122 kg), SpO2 97.00%., Body mass index is 26.05 kg/(m^2). General: Well developed, well nourished, in no acute distress. Head: Normocephalic, atraumatic, sclera non-icteric, no xanthomas, nares are without discharge.  Neck: Supple. Lungs: Breathing is unlabored. Heart: Normal rate. Msk:  Strength and tone appear normal for age. Wound: Wound well healed. Mild tenderness to palpation at medial aspect just distal to wound. Slight purulosanguinous drainage from medial aspect of wound. FROM of digits with normal sensation throughout. Swelling and erythema distal to the wound. Wedding band removed.   Skin: See above, otherwise dry without rash or erythema. Extremities: No clubbing or cyanosis. No edema. Neuro: Alert and oriented X 3. Moves all extremities spontaneously.  Psych:  Responds to questions appropriately with a normal affect.   PROCEDURE:   Left hand:  [facility] reading (PRIMARY) by  Dr. Lorelei Pont. Negative. Degenerative changes.  . Results for orders placed in visit on 05/08/14  POCT CBC      Result Value Ref Range   WBC 6.9  4.6 - 10.2 K/uL   Lymph, poc 1.9  0.6 - 3.4   POC LYMPH PERCENT 27.1  10 - 50 %L   MID (cbc) 0.5  0 - 0.9   POC MID % 7.1  0 - 12 %M   POC Granulocyte 4.5  2 - 6.9  Granulocyte percent 65.8  37 - 80 %G   RBC 4.51 (*) 4.69 - 6.13 M/uL   Hemoglobin 14.2  14.1 - 18.1 g/dL   HCT, POC 43.7  43.5 - 53.7 %   MCV 96.9  80 - 97 fL   MCH, POC 31.5 (*) 27 - 31.2 pg   MCHC 32.5  31.8 - 35.4 g/dL   RDW, POC 14.5     Platelet Count, POC 286  142 - 424 K/uL   MPV 8.9  0 - 99.8 fL   Wound culture pending  Assessment and Plan: 80 y.o. male with skin tear of the dorsal right hand, now with wound infection -Rocephin 1 gram IM -Augmentin 875/125 mg 1 po bid #20 no RF -Remove wedding band, this was done by Amy Littrell -Recheck 48 hours -Await wound culture  results   Signed, Christell Faith, MHS, PA-C [facility] [place] [address] [facility] 05/08/2014 12:42 PM

## 2014-05-10 ENCOUNTER — Ambulatory Visit (INDEPENDENT_AMBULATORY_CARE_PROVIDER_SITE_OTHER): Payer: Medicare Other | Admitting: Physician Assistant

## 2014-05-10 VITALS — BP 140/64 | HR 75 | Temp 98.0°F | Resp 20 | Ht 65.5 in | Wt 159.0 lb

## 2014-05-10 DIAGNOSIS — T148XXA Other injury of unspecified body region, initial encounter: Principal | ICD-10-CM

## 2014-05-10 DIAGNOSIS — M79609 Pain in unspecified limb: Secondary | ICD-10-CM

## 2014-05-10 DIAGNOSIS — L089 Local infection of the skin and subcutaneous tissue, unspecified: Secondary | ICD-10-CM

## 2014-05-10 NOTE — Progress Notes (Signed)
   Subjective:    Patient ID: Andre [name], male    DOB: 07/26/1923, 80 y.o.   MRN: [id number]   PCP: Chancy Hurter, MD  Chief Complaint  Patient presents with  . recheck on left hand injury    pt stated that he was seen here a couple of days ago and told to come back for recheck on left hand injury    Medications, allergies, past medical history, surgical history, family history, social history and problem list reviewed and updated.  HPI  This patient was seen here on 05/01/2014 with a cut of the dorsum of the LEFT hand. He had a 7 cm x 1 cm skin tear. The wound was washed and irrigated and the skin flap approximated, and steri-strips were applied.    He returned on 5/28 for a recheck, reporting waxing and waning swelling of the hand, with some redness.  He reported actively trying to avoid getting the wound wet in the shower. The wound itself appeared well-healed, but he had significant swelling and erythema of the hand distal to the wound.  There was a slight amount of purulosanguinous drainage from the medial aspect of the wound.  He had FROM and normal sensation of the hand and digits.  His wedding band was removed using umbilical tape. CBC revealed a normal WBC, the steri-strips were removed, he was given 1 g Rocephin IM and started Augmentin (he took the first dose yesterday).  He reports he's tolerated 3 doses of Augmentin without adverse effects and notes improvement in the swelling and redness.  No drainage from the wound. No pain.  No fever/chills. Preliminary wound culture results reveals abundant Gram Positive Cocci in Clusters.   Review of Systems     Objective:   Physical Exam  Constitutional: He appears well-developed and well-nourished. No distress.  BP 140/64  Pulse 75  Temp(Src) 98 F (36.7 C) (Oral)  Resp 20  Ht 5' 5.5" (1.664 m)  Wt 159 lb (72.122 kg)  BMI 26.05 kg/m2  SpO2 95%   Eyes: No scleral icterus.  Pulmonary/Chest: Effort normal.  Skin:  Skin is warm, dry and intact.  Erythema and edema persist, but significantly improved from 5/28. No drainage. Non tender. No induration.          Assessment & Plan:  1. Wound infection Continue antibiotic as prescribed.  Wash at least daily with soap and water.  OK to leave open without dressing.   Return for re-evaluation upon completion of the antibiotic.   Fara Chute, PA-C Physician Assistant-Certified [facility]

## 2014-05-10 NOTE — Patient Instructions (Signed)
Continue the antibiotic until it's completely gone. You should wash the wound daily with soap and water (and after using the bathroom and before eating), and dry completely. You do not need to cover the wound with a dressing at this point.

## 2014-05-11 LAB — WOUND CULTURE
GRAM STAIN: NONE SEEN
GRAM STAIN: NONE SEEN

## 2014-05-17 ENCOUNTER — Ambulatory Visit (INDEPENDENT_AMBULATORY_CARE_PROVIDER_SITE_OTHER): Payer: Medicare Other | Admitting: Physician Assistant

## 2014-05-17 VITALS — BP 142/60 | HR 76 | Temp 98.3°F | Resp 16

## 2014-05-17 DIAGNOSIS — L089 Local infection of the skin and subcutaneous tissue, unspecified: Secondary | ICD-10-CM

## 2014-05-17 DIAGNOSIS — T148XXA Other injury of unspecified body region, initial encounter: Principal | ICD-10-CM

## 2014-05-17 NOTE — Progress Notes (Signed)
Subjective:    Patient ID: Andre [name], male    DOB: 11-Aug-1923, 80 y.o.   MRN: [id number]  HPI Primary Physician: Chancy Hurter, MD  Chief Complaint: Wound check  HPI: 80 y.o. male with history below presents for follow up of wound infection of the dorsal aspect of the left hand. Patient initially seen on 05/01/14 with a 7 cm x 1 cm skin tear. Wound was washed, irrigated, skin flap was approximated, and steri strips were applied.   Patient returned on 05/08/14 for recheck reporting waxing and waning of swelling of the hand and some erythema. The wound itself looked well healed, but did have swelling and erythema sdistal to the wound. His wedding band was removed using umbilical tape. CBC revealed normal WBC count and had an xray that revealed chronic changes. He was given Rocephin 1 gram IM and started on Augmentin.   He followed up on 05/10/14 and noted improvement of the swelling and erythema. No drainage from the wound. Wound culture grew MSSA. He was advised to follow up upon completion of the Augmentin.  Today he state he is much better. Swelling and erythema have resolved. No pain. No drainage. No tension in the hand. Planing to move his wedding band back to his left 4th finger.    Past Medical History  Diagnosis Date  . Adenomatous colon polyp 04/1985  . Hyperlipidemia   . Hypertension   . PAT (paroxysmal atrial tachycardia)   . Macular degeneration     bilateral  . Diverticulosis      Home Meds: Prior to Admission medications   Medication Sig Start Date End Date Taking? Authorizing Provider  amLODipine (NORVASC) 10 MG tablet Take 1 tablet (10 mg total) by mouth daily. 02/14/14  Yes Lisabeth Pick, MD  amoxicillin-clavulanate (AUGMENTIN) 875-125 MG per tablet Take 1 tablet by mouth 2 (two) times daily. 05/08/14  Yes Terrilee Dudzik M Wynton Hufstetler, PA-C  aspirin 81 MG tablet Take 81 mg by mouth daily.     Yes Historical Provider, MD  Calcium Carbonate-Vit D-Min (CALTRATE 600+D PLUS)  600-400 MG-UNIT per tablet Take 1 tablet by mouth daily.     Yes Historical Provider, MD  losartan (COZAAR) 100 MG tablet Take 1 tablet (100 mg total) by mouth daily. 02/14/14  Yes Lisabeth Pick, MD  lovastatin (MEVACOR) 40 MG tablet Take 1 tablet (40 mg total) by mouth at bedtime. 02/14/14  Yes Lisabeth Pick, MD  Multiple Vitamins-Minerals (ABC PLUS SENIOR) TABS Take 1 tablet by mouth daily.   Yes Historical Provider, MD    Allergies: No Known Allergies  History   Social History  . Marital Status: Married    Spouse Name: N/A    Number of Children: 71  . Years of Education: N/A   Occupational History  . RETIRED    Social History Main Topics  . Smoking status: Former Research scientist (life sciences)  . Smokeless tobacco: Never Used  . Alcohol Use: Yes     Comment: 2 glass of wine a day  . Drug Use: Not on file  . Sexual Activity: Not on file   Other Topics Concern  . Not on file   Social History Narrative  . No narrative on file     Review of Systems  Constitutional: Negative for fever and chills.  Musculoskeletal: Negative for arthralgias, joint swelling and myalgias.  Skin: Positive for wound. Negative for color change, pallor and rash.       Resolved wound.  Objective:   Physical Exam  Physical Exam: Blood pressure 142/60, pulse 76, temperature 98.3 F (36.8 C), resp. rate 16., There is no weight on file to calculate BMI. General: Well developed, well nourished, in no acute distress. Head: Normocephalic, atraumatic, eyes without discharge, sclera non-icteric, nares are without discharge.    Neck: Supple. Full ROM.  Lungs: Breathing is unlabored. Heart: Regular rate. Msk:  Strength and tone normal for age. Extremities/Skin: Warm and dry. No clubbing or cyanosis. No edema. No rashes. Left dorsal hand: Resolved erythema and swelling. No tenderness to palpation throughout. No drainage. No induration.  Neuro: Alert and oriented X 3. Moves all extremities spontaneously. Gait is normal.  CNII-XII grossly in tact. Psych:  Responds to questions appropriately with a normal affect.        Assessment & Plan:  80 year old male with resolved wound infection to the dorsal aspect of the left hand -Resolved -RTC prn   Christell Faith, MHS, PA-C [facility] [address] [place],  [address] [place] 05/17/2014 1:09 PM

## 2014-07-08 ENCOUNTER — Encounter: Payer: Self-pay | Admitting: Internal Medicine

## 2014-07-25 ENCOUNTER — Encounter: Payer: Self-pay | Admitting: Internal Medicine

## 2014-07-25 ENCOUNTER — Ambulatory Visit (INDEPENDENT_AMBULATORY_CARE_PROVIDER_SITE_OTHER): Payer: Medicare Other | Admitting: Internal Medicine

## 2014-07-25 VITALS — BP 180/70 | HR 76 | Temp 98.4°F | Ht 65.5 in | Wt 157.8 lb

## 2014-07-25 DIAGNOSIS — H6092 Unspecified otitis externa, left ear: Secondary | ICD-10-CM

## 2014-07-25 DIAGNOSIS — H60399 Other infective otitis externa, unspecified ear: Secondary | ICD-10-CM

## 2014-07-25 MED ORDER — CIPROFLOXACIN HCL 250 MG PO TABS: 250.0000 mg | ORAL_TABLET | Freq: Two times a day (BID) | ORAL | Status: DC

## 2014-07-25 MED ORDER — NEOMYCIN-POLYMYXIN-HC 3.5-10000-1 OT SOLN: 3.0000 [drp] | Freq: Four times a day (QID) | OTIC | Status: DC

## 2014-07-25 NOTE — Progress Notes (Signed)
Pre visit review using our clinic review tool, if applicable. No additional management support is needed unless otherwise documented below in the visit note. 

## 2014-07-25 NOTE — Patient Instructions (Signed)
Please take all new medication as prescribed - the ear drop antibiotic (sent to your pharmacy)  You are also given the pill antibiotic, but no need to take this unless you are not improving with pain and swelling or ear discharge  Please continue all other medications as before, and refills have been done if requested.  Please have the pharmacy call with any other refills you may need.  Please keep your appointments with your specialists as you may have planned

## 2014-07-26 DIAGNOSIS — H6092 Unspecified otitis externa, left ear: Secondary | ICD-10-CM | POA: Insufficient documentation

## 2014-07-26 NOTE — Progress Notes (Signed)
   Subjective:    Patient ID: Andre [name], male    DOB: [birth year], 80 y.o.   MRN: [id number]  HPI  Here to f/u with acute visit, c/o 1.5 wk onset left ear dull pain, swelling without fever, d/c , worse to lie on left side, nothing makes better including otc pain relievers.  Started after vigorous debrox use to clear wax impaction Past Medical History  Diagnosis Date  . Adenomatous colon polyp 04/1985  . Hyperlipidemia   . Hypertension   . PAT (paroxysmal atrial tachycardia)   . Macular degeneration     bilateral  . Diverticulosis    Past Surgical History  Procedure Laterality Date  . Cataract extraction      bilateral  . Inguinal hernia repair    . Tendon repair  12/11    right leg    reports that he has quit smoking. He has never used smokeless tobacco. He reports that he drinks alcohol. His drug history is not on file. family history includes Cancer in his father; Hypertension in his mother. No Known Allergies Current Outpatient Prescriptions on File Prior to Visit  Medication Sig Dispense Refill  . amLODipine (NORVASC) 10 MG tablet Take 1 tablet (10 mg total) by mouth daily.  90 tablet  3  . aspirin 81 MG tablet Take 81 mg by mouth daily.        . Calcium Carbonate-Vit D-Min (CALTRATE 600+D PLUS) 600-400 MG-UNIT per tablet Take 1 tablet by mouth daily.        Marland Kitchen losartan (COZAAR) 100 MG tablet Take 1 tablet (100 mg total) by mouth daily.  90 tablet  3  . lovastatin (MEVACOR) 40 MG tablet Take 1 tablet (40 mg total) by mouth at bedtime.  90 tablet  3  . Multiple Vitamins-Minerals (ABC PLUS SENIOR) TABS Take 1 tablet by mouth daily.       No current facility-administered medications on file prior to visit.   Review of Systems All otherwise neg per pt     Objective:   Physical Exam BP 180/70  Pulse 76  Temp(Src) 98.4 F (36.9 C) (Oral)  Ht 5' 5.5" (1.664 m)  Wt 157 lb 12.8 oz (71.578 kg)  BMI 25.85 kg/m2  SpO2 95% VS noted,  Constitutional: Pt appears  well-developed, well-nourished.  HENT: Head: NCAT.  Right Ear: External ear normal.  Left Ear: External ear normal.  Eyes: . Pupils are equal, round, and reactive to light. Conjunctivae and EOM are normal Left ear canal with 1+ red/tender/swelling without d/c Neck: Normal range of motion. Neck supple.  Cardiovascular: Normal rate and regular rhythm.   Pulmonary/Chest: Effort normal and breath sounds normal.  Neurological: Pt is alert. Not confused , motor grossly intact    Assessment & Plan:

## 2014-07-26 NOTE — Assessment & Plan Note (Signed)
Mild to mod, for antibx course - cortisporin, but also gave backup cipro rx po for any worsening red/tender/swelling/d/c/fever,  to f/u any worsening symptoms or concerns

## 2014-08-21 ENCOUNTER — Ambulatory Visit (INDEPENDENT_AMBULATORY_CARE_PROVIDER_SITE_OTHER): Payer: Medicare Other | Admitting: Family Medicine

## 2014-08-21 ENCOUNTER — Telehealth: Payer: Self-pay | Admitting: Family Medicine

## 2014-08-21 ENCOUNTER — Encounter: Payer: Self-pay | Admitting: Family Medicine

## 2014-08-21 VITALS — BP 140/54 | HR 60 | Temp 98.2°F | Wt 158.0 lb

## 2014-08-21 DIAGNOSIS — I10 Essential (primary) hypertension: Secondary | ICD-10-CM

## 2014-08-21 NOTE — Telephone Encounter (Signed)
Pt seen today

## 2014-08-21 NOTE — Telephone Encounter (Signed)
FYI

## 2014-08-21 NOTE — Patient Instructions (Addendum)
Blood pressure  Goal top # is 150, goal bottom # is 90.   Looked great today  Continue amlodipine and losartan   Check every other morning for next week. Make sure to rest 5 minutes before checking. Check before breakfast. If your numbers are >150/90 on more than 2 occassions, please come back and see. Bring your cuff if that happens. If your numbers are less than this, please just give [place] a call to update [place].

## 2014-08-21 NOTE — Telephone Encounter (Signed)
Patient Information:  Caller Name: Marque  Phone: [phone]  Patient: Andre, [name]  Gender: Male  DOB: 09/07/1923  Age: 80 Years  PCP: Garret Reddish  Office Follow Up:  Does the office need to follow up with this patient?: No  Instructions For The Office: N/A  RN Note:  BP left arm 187/60 at 1100 and 158/53 at 1105 08/21/14. Wife recently hospitalized for 3 days but home and recovering now.  Exercises regularly.    Symptoms  Reason For Call & Symptoms: Documented BP elevation for several days. BP ranging 158-187/53-60 left arm.  Aware BP is elevated when hears pulsating sound in ear.  Reviewed Health History In EMR: Yes  Reviewed Medications In EMR: Yes  Reviewed Allergies In EMR: Yes  Reviewed Surgeries / Procedures: Yes  Date of Onset of Symptoms: 08/18/2014  Guideline(s) Used:  High Blood Pressure  Disposition Per Guideline:   See Today in Office  Reason For Disposition Reached:   BP > 180/110  Advice Given:  General:  Untreated high blood pressure may cause damage to the heart, brain, kidneys, and eyes.  Treatment of high blood pressure can reduce the risk of stroke, heart attack, and heart failure.  The goal of blood pressure treatment for most patients with hypertension is to keep the blood pressure under 140/90.  Lifestyle Changes  Do 30 minutes of aerobic physical activity (e.g., brisk walking) most days of the week.  Eat a diet high in fresh fruits and low-fat dairy products. Limit your intake of saturated and total fat. Choose foods that are lower in salt.  Call Back If:  Headache, blurred vision, difficulty talking, or difficulty walking occurs  Chest pain or difficulty breathing occurs  You want to go in to the office for a blood pressure check  You become worse.  Patient Will Follow Care Advice:  YES  Appointment Scheduled:  08/21/2014 13:30:00 Appointment Scheduled Provider:  Garret Reddish

## 2014-08-21 NOTE — Assessment & Plan Note (Signed)
Well controlled in office. Suspect home elevations were due to stress from wife being in hospital. Counseled patient on appropriate way to check BP at home and goals for BP. Patient to follow up if home readings elevated per avs.

## 2014-08-21 NOTE — Progress Notes (Signed)
  Garret Reddish, MD Phone: [phone]  Subjective:   Andre [name] is a 80 y.o. year old very pleasant male patient who presents with the following:  Hypertension BP Readings from Last 3 Encounters:  08/21/14 140/54  07/25/14 180/70  05/17/14 142/60  Home BP monitoring-yes, had noted elevation into 170s after his wife had been in hospital fro 3 days, 5 minute follow up at 158. No diastolic elevation. 180 [facility].  Compliant with medications-yes without side effects ROS-Denies any CP, HA, SOB, blurry vision, LE edema (other than trace), transient weakness, orthopnea, PND. Does occasionally hear pulsating in his ear for his pulse, feels like louder when BP high.   Past Medical History-HTN, HLD  Medications- reviewed and updated Current Outpatient Prescriptions  Medication Sig Dispense Refill  . amLODipine (NORVASC) 10 MG tablet Take 1 tablet (10 mg total) by mouth daily.  90 tablet  3  . aspirin 81 MG tablet Take 81 mg by mouth daily.        . Calcium Carbonate-Vit D-Min (CALTRATE 600+D PLUS) 600-400 MG-UNIT per tablet Take 1 tablet by mouth daily.        Marland Kitchen losartan (COZAAR) 100 MG tablet Take 1 tablet (100 mg total) by mouth daily.  90 tablet  3  . lovastatin (MEVACOR) 40 MG tablet Take 1 tablet (40 mg total) by mouth at bedtime.  90 tablet  3  . Multiple Vitamins-Minerals (ABC PLUS SENIOR) TABS Take 1 tablet by mouth daily.       No current facility-administered medications for this visit.    Objective: BP 140/54  Pulse 60  Temp(Src) 98.2 F (36.8 C)  Wt 158 lb (71.668 kg) Gen: NAD, resting comfortably in chair CV: RRR faint 2/6 SEM. No rubs or gallops Lungs: CTAB no crackles, wheeze, rhonchi Ext: trace edema R slightly > L, 2+ radial pulses Skin: warm, dry, no rash Neuro: 5/5 grip strength  Manual BP right arm 146/60  Assessment/Plan:  HYPERTENSION Well controlled in office. Suspect home elevations were due to stress from wife being in  hospital. Counseled patient on appropriate way to check BP at home and goals for BP. Patient to follow up if home readings elevated per avs.   would check home cuff if elevated as per avs.   >50% of >15 minute office visit was spent on counseling (BP goals, appropriate measures, warning signs for return) and coordination of care

## 2014-08-26 ENCOUNTER — Other Ambulatory Visit: Payer: Self-pay | Admitting: Dermatology

## 2014-08-28 ENCOUNTER — Telehealth: Payer: Self-pay

## 2014-08-28 NOTE — Telephone Encounter (Signed)
I am very pleased with these readings. Continue current medications.

## 2014-08-28 NOTE — Telephone Encounter (Signed)
Pt is calling to inform you of BP readings...  Friday 9/11:143/60  Sunday 9/13:149/59  Tuesday 09/15:138/59  Thursday 09/17:147/53

## 2014-08-29 NOTE — Telephone Encounter (Signed)
Pt.notified

## 2014-09-28 ENCOUNTER — Telehealth: Payer: Self-pay

## 2015-01-15 ENCOUNTER — Other Ambulatory Visit: Payer: Self-pay | Admitting: Internal Medicine

## 2015-02-17 ENCOUNTER — Encounter: Payer: Self-pay | Admitting: Family Medicine

## 2015-02-17 ENCOUNTER — Encounter: Payer: Medicare Other | Admitting: Internal Medicine

## 2015-02-17 ENCOUNTER — Ambulatory Visit (INDEPENDENT_AMBULATORY_CARE_PROVIDER_SITE_OTHER): Payer: Medicare Other | Admitting: Family Medicine

## 2015-02-17 VITALS — BP 150/54 | HR 72 | Temp 98.2°F | Ht 65.0 in | Wt 153.0 lb

## 2015-02-17 DIAGNOSIS — Z85828 Personal history of other malignant neoplasm of skin: Secondary | ICD-10-CM | POA: Insufficient documentation

## 2015-02-17 DIAGNOSIS — N179 Acute kidney failure, unspecified: Secondary | ICD-10-CM | POA: Insufficient documentation

## 2015-02-17 DIAGNOSIS — N183 Chronic kidney disease, stage 3 unspecified: Secondary | ICD-10-CM

## 2015-02-17 DIAGNOSIS — H3530 Unspecified macular degeneration: Secondary | ICD-10-CM | POA: Insufficient documentation

## 2015-02-17 DIAGNOSIS — Z87891 Personal history of nicotine dependence: Secondary | ICD-10-CM | POA: Insufficient documentation

## 2015-02-17 DIAGNOSIS — E785 Hyperlipidemia, unspecified: Secondary | ICD-10-CM | POA: Diagnosis not present

## 2015-02-17 DIAGNOSIS — I10 Essential (primary) hypertension: Secondary | ICD-10-CM

## 2015-02-17 DIAGNOSIS — Z23 Encounter for immunization: Secondary | ICD-10-CM

## 2015-02-17 DIAGNOSIS — N189 Chronic kidney disease, unspecified: Secondary | ICD-10-CM

## 2015-02-17 LAB — CBC
HCT: 43.2 % (ref 39.0–52.0)
HEMOGLOBIN: 14.7 g/dL (ref 13.0–17.0)
MCHC: 34.2 g/dL (ref 30.0–36.0)
MCV: 93.4 fl (ref 78.0–100.0)
PLATELETS: 252 10*3/uL (ref 150.0–400.0)
RBC: 4.62 Mil/uL (ref 4.22–5.81)
RDW: 13.7 % (ref 11.5–15.5)
WBC: 7.9 10*3/uL (ref 4.0–10.5)

## 2015-02-17 LAB — LIPID PANEL
Cholesterol: 172 mg/dL (ref 0–200)
HDL: 57.3 mg/dL (ref >39.00)
LDL Cholesterol: 86 mg/dL (ref 0–99)
NonHDL: 114.7
Total CHOL/HDL Ratio: 3
Triglycerides: 145 mg/dL (ref 0.0–149.0)
VLDL: 29 mg/dL (ref 0.0–40.0)

## 2015-02-17 LAB — COMPREHENSIVE METABOLIC PANEL
ALT: 12 U/L (ref 0–53)
AST: 14 U/L (ref 0–37)
Albumin: 4.2 g/dL (ref 3.5–5.2)
Alkaline Phosphatase: 50 U/L (ref 39–117)
BILIRUBIN TOTAL: 0.6 mg/dL (ref 0.2–1.2)
BUN: 20 mg/dL (ref 6–23)
CHLORIDE: 105 meq/L (ref 96–112)
CO2: 29 meq/L (ref 19–32)
CREATININE: 1.15 mg/dL (ref 0.40–1.50)
Calcium: 9.4 mg/dL (ref 8.4–10.5)
GFR: 63.56 mL/min (ref >60.00)
Glucose, Bld: 98 mg/dL (ref 70–99)
Potassium: 4 mEq/L (ref 3.5–5.1)
Sodium: 139 mEq/L (ref 135–145)
Total Protein: 7.6 g/dL (ref 6.0–8.3)

## 2015-02-17 MED ORDER — LOSARTAN POTASSIUM-HCTZ 100-25 MG PO TABS: 1.0000 | ORAL_TABLET | Freq: Every day | ORAL | Status: DC

## 2015-02-17 NOTE — Assessment & Plan Note (Signed)
Controlled on Lovastatin 40mg  previously. Update lipids today.

## 2015-02-17 NOTE — Patient Instructions (Addendum)
Update labs today  Blood pressure up on top # and want to get this below 140 since you have some mild kidney changes. Change losartan to a combination pill called losartan-hydrochlorothiazide. See me back within 2 weeks of the change. Let me know if you have any lightheadedness or other side effects that are too strong after the change.   Final pneumonia shot today

## 2015-02-17 NOTE — Assessment & Plan Note (Signed)
GFR 50-60. Need tighter blood pressure control-see HTN

## 2015-02-17 NOTE — Progress Notes (Signed)
Andre Reddish, MD Phone: [phone]  Subjective:  Patient presents today to establish care with me as their new primary care provider. Patient was formerly a patient of Dr. Leanne Chang. Chief complaint-noted.   Macular degeneration-has given up tennis and driving Visit to Dr. Alinda Money next month Rides stationary bike at least 4 days a week. Active with physical exercise. Also does body weight exercises 3x a week. Including squats, planks.   Hypertension-poor control  CKD- stable over several years BP Readings from Last 3 Encounters:  02/17/15 150/54  08/21/14 140/54  07/25/14 180/70  Home BP monitoring-no, only checks if feels like elevated Compliant with medications-yes without side effects ROS-Denies any CP, HA, SOB. occasional LE edema if on feet long period.    Hyperlipidemia-previously controlled  Lab Results  Component Value Date   LDLCALC 79 02/14/2014  On statin: lovastatin Regular exercise: yes Diet: eats well ROS- no chest pain or shortness of breath. No myalgias  The following were reviewed and entered/updated in epic: Past Medical History  Diagnosis Date  . Adenomatous colon polyp 04/1985    no further colonoscopy, 2008-last colonoscopy, no polyps    . Hyperlipidemia   . Hypertension   . PAT (paroxysmal atrial tachycardia)     many years ago, worse with smoking  . Macular degeneration     bilateral  . Diverticulosis   . History of skin cancer     dermatology every 6 months Dr. [name]   Patient Active Problem List   Diagnosis Date Noted  . CKD (chronic kidney disease), stage III 02/17/2015    Priority: Medium  . Malignant neoplasm of prostate 01/05/2010    Priority: Medium  . Hyperlipidemia 12/04/2007    Priority: Medium  . Essential hypertension 12/04/2007    Priority: Medium  . Macular degeneration 02/17/2015    Priority: Low  . Former smoker 02/17/2015    Priority: Low  . History of skin cancer     Priority: Low  . Angiodysplasia of intestine  with hemorrhage 05/15/2007    Priority: Low   Past Surgical History  Procedure Laterality Date  . Cataract extraction      bilateral  . Inguinal hernia repair    . Tendon repair  12/11    right leg    Family History  Problem Relation Age of Onset  . Hypertension Mother   . Cancer Father     lung    Medications- reviewed and updated Current Outpatient Prescriptions  Medication Sig Dispense Refill  . amLODipine (NORVASC) 10 MG tablet Take 1 tablet (10 mg total) by mouth daily. 90 tablet 3  . aspirin 81 MG tablet Take 81 mg by mouth daily.      . Calcium Carbonate-Vit D-Min (CALTRATE 600+D PLUS) 600-400 MG-UNIT per tablet Take 1 tablet by mouth daily.      Marland Kitchen losartan (COZAAR) 100 MG tablet Take 1 tablet by mouth  daily 90 tablet 3  . lovastatin (MEVACOR) 40 MG tablet Take 1 tablet by mouth at  bedtime 90 tablet 3  . Multiple Vitamins-Minerals (ABC PLUS SENIOR) TABS Take 1 tablet by mouth daily.     No current facility-administered medications for this visit.    Allergies-reviewed and updated No Known Allergies  History   Social History  . Marital Status: Married    Spouse Name: N/A  . Number of Children: 5  . Years of Education: N/A   Occupational History  . RETIRED    Social History Main Topics  . Smoking  status: Former Smoker -- 1.00 packs/day for 60 years    Types: Cigarettes    Quit date: 12/13/2003  . Smokeless tobacco: Never Used  . Alcohol Use: 0.0 oz/week    0 Standard drinks or equivalent per week     Comment: 2 glass of wine a day  . Drug Use: No  . Sexual Activity: Not on file   Other Topics Concern  . None   Social History Narrative   Married (64 years in 08/2015-goes to outside practice). 5 children. 8 grandchildren (lost 1 grandchild #9 to [place] 18,  Lost #9 to seizures)      Retired at [employer], high Patent attorney: time with family, go to beach (51 years in a row), travel    ROS--See HPI   Objective: BP 150/54 mmHg   Pulse 72  Temp(Src) 98.2 F (36.8 C)  Ht 5\' 5"  (1.651 m)  Wt 153 lb (69.4 kg)  BMI 25.46 kg/m2 Gen: NAD, resting comfortably, appears younger than stated age.  HEENT: Mucous membranes are moist. Oropharynx normal.  TM obscured by wax bilaterally (irrigated by Clyde Lundborg, CMA) CV: RRR no murmurs rubs or gallops Lungs: CTAB no crackles, wheeze, rhonchi Abdomen: soft/nontender/nondistended/normal bowel sounds. No rebound or guarding.  Ext: no edema L leg, trace edema at ankle R leg (chronic issue) Skin: warm, dry, no rash Neuro: grossly normal, moves all extremities, PERRLA  Assessment/Plan:  Essential hypertension Add HCTZ 25mg  to Amlodipine 10mg , losartan 100mg  due to poor control blood pressure.    CKD (chronic kidney disease), stage III GFR 50-60. Need tighter blood pressure control-see HTN   Hyperlipidemia Controlled on Lovastatin 40mg  previously. Update lipids today.    follow up within 2 weeks of BP medication change. Otherwise, in 1 year for annual wellness visit (schedule next visit)  Orders Placed This Encounter  Procedures  . Pneumococcal conjugate vaccine 13-valent  . CBC    [place]  . Comprehensive metabolic panel    [place]    Order Specific Question:  Has the patient fasted?    Answer:  No  . Lipid panel        Order Specific Question:  Has the patient fasted?    Answer:  No   Meds ordered this encounter  Medications  . losartan-hydrochlorothiazide (HYZAAR) 100-25 MG per tablet    Sig: Take 1 tablet by mouth daily.    Dispense:  90 tablet    Refill:  3

## 2015-02-17 NOTE — Assessment & Plan Note (Signed)
Add HCTZ 25mg  to Amlodipine 10mg , losartan 100mg  due to poor control blood pressure.

## 2015-03-03 ENCOUNTER — Other Ambulatory Visit: Payer: Self-pay | Admitting: Dermatology

## 2015-03-13 ENCOUNTER — Ambulatory Visit (INDEPENDENT_AMBULATORY_CARE_PROVIDER_SITE_OTHER): Payer: Medicare Other | Admitting: Family Medicine

## 2015-03-13 ENCOUNTER — Encounter: Payer: Self-pay | Admitting: Family Medicine

## 2015-03-13 VITALS — BP 144/62 | HR 68 | Temp 97.9°F | Wt 155.0 lb

## 2015-03-13 DIAGNOSIS — I10 Essential (primary) hypertension: Secondary | ICD-10-CM | POA: Diagnosis not present

## 2015-03-13 DIAGNOSIS — N183 Chronic kidney disease, stage 3 unspecified: Secondary | ICD-10-CM

## 2015-03-13 NOTE — Assessment & Plan Note (Signed)
GFR actually above 60 on last check. Will allow bp in 140-150 range. Continue statin as well.

## 2015-03-13 NOTE — Patient Instructions (Signed)
Schedule a visit if blood pressure gets above 150. Check about once a week.   See me in a year for an annual wellness visit-make sure they book 30 minutes for [place].

## 2015-03-13 NOTE — Assessment & Plan Note (Signed)
Mild poor control considering CKD but honestly recent numbers look more CKD Stage II and at 81 years old do not want to add an additional medication. For age without comorbidity, BP looks fine. We opted for home monitoring over next year and if above 150/90 he will follow up. Consider valsartan instead of losartan as next step.

## 2015-03-13 NOTE — Progress Notes (Signed)
  Garret Reddish, MD Phone: [phone]  Subjective:   Andre [name] is a 81 y.o. year old very pleasant male patient who presents with the following:  Hypertension-mild poor control on amlodipine 10mg , losartan 100mg  with recent addition of HCTZ 25mg .  CKD stage III-stable around GFR 50-65- stable. Trying to improve BP control and lipids controlled already.  BP Readings from Last 3 Encounters:  03/13/15 144/62  02/17/15 150/54  08/21/14 140/54   Home BP monitoring-no Exercises 5 days a week at least. VERY active for age.  Compliant with medications-yes without side effects ROS-Denies any CP, HA, SOB, blurry vision, LE edema.   Past Medical History- Patient Active Problem List   Diagnosis Date Noted  . CKD (chronic kidney disease), stage III 02/17/2015    Priority: Medium  . Malignant neoplasm of prostate 01/05/2010    Priority: Medium  . Hyperlipidemia 12/04/2007    Priority: Medium  . Essential hypertension 12/04/2007    Priority: Medium  . Macular degeneration 02/17/2015    Priority: Low  . Former smoker 02/17/2015    Priority: Low  . History of skin cancer     Priority: Low  . Angiodysplasia of intestine with hemorrhage 05/15/2007    Priority: Low   Medications- reviewed and updated Current Outpatient Prescriptions  Medication Sig Dispense Refill  . amLODipine (NORVASC) 10 MG tablet Take 1 tablet (10 mg total) by mouth daily. 90 tablet 3  . aspirin 81 MG tablet Take 81 mg by mouth daily.      . Calcium Carbonate-Vit D-Min (CALTRATE 600+D PLUS) 600-400 MG-UNIT per tablet Take 1 tablet by mouth daily.      Marland Kitchen losartan-hydrochlorothiazide (HYZAAR) 100-25 MG per tablet Take 1 tablet by mouth daily. 90 tablet 3  . lovastatin (MEVACOR) 40 MG tablet Take 1 tablet by mouth at  bedtime 90 tablet 3  . Multiple Vitamins-Minerals (ABC PLUS SENIOR) TABS Take 1 tablet by mouth daily.     Objective: BP 144/62 mmHg  Pulse 68  Temp(Src) 97.9 F (36.6 C)  Wt 155 lb (70.308  kg) Gen: NAD, resting comfortably in chair CV: RRR no murmurs rubs or gallops Lungs: CTAB no crackles, wheeze, rhonchi Abdomen: soft/nontender/nondistended/normal bowel sounds. Ext: no edema Skin: warm, dry, no rash Neuro: grossly normal, moves all extremities  Assessment/Plan:  Essential hypertension Mild poor control considering CKD but honestly recent numbers look more CKD Stage II and at 81 years old do not want to add an additional medication. For age without comorbidity, BP looks fine. We opted for home monitoring over next year and if above 150/90 he will follow up. Consider valsartan instead of losartan as next step.    CKD (chronic kidney disease), stage III GFR actually above 60 on last check. Will allow bp in 140-150 range. Continue statin as well.

## 2015-04-09 ENCOUNTER — Telehealth: Payer: Self-pay | Admitting: Family Medicine

## 2015-04-09 MED ORDER — AMLODIPINE BESYLATE 10 MG PO TABS: 10.0000 mg | ORAL_TABLET | Freq: Every day | ORAL | Status: DC

## 2015-04-09 NOTE — Telephone Encounter (Signed)
Pt needs refill on amlodipine 10 mg #90 w/refills [facility]

## 2015-04-09 NOTE — Telephone Encounter (Signed)
Medication refilled

## 2015-04-15 LAB — PSA: PSA: 11.16

## 2015-04-24 ENCOUNTER — Encounter: Payer: Self-pay | Admitting: Family Medicine

## 2015-10-28 ENCOUNTER — Telehealth: Payer: Self-pay | Admitting: Family Medicine

## 2015-10-28 NOTE — Telephone Encounter (Signed)
See below

## 2015-10-28 NOTE — Telephone Encounter (Signed)
Pt scheduled for Friday

## 2015-10-28 NOTE — Telephone Encounter (Signed)
See below Andre [name].

## 2015-10-28 NOTE — Telephone Encounter (Signed)
Yes would advise him to be seen as soon as possible- can use same day slot

## 2015-10-28 NOTE — Telephone Encounter (Signed)
Pt said he has swelling in his right leg and would like to know if he need to come in. He said it swells doing the day and goes a way at night. Pt is asking if Dr Yong Channel need to see him.I tried to schedule him an appt and he said no  give [place] the message

## 2015-10-30 ENCOUNTER — Ambulatory Visit (INDEPENDENT_AMBULATORY_CARE_PROVIDER_SITE_OTHER): Payer: Medicare Other | Admitting: Family Medicine

## 2015-10-30 ENCOUNTER — Telehealth: Payer: Self-pay

## 2015-10-30 ENCOUNTER — Encounter: Payer: Self-pay | Admitting: Family Medicine

## 2015-10-30 ENCOUNTER — Ambulatory Visit (HOSPITAL_COMMUNITY)
Admission: RE | Admit: 2015-10-30 | Discharge: 2015-10-30 | Disposition: A | Discharge status: Home / Self Care | Payer: Medicare Other | Source: Ambulatory Visit | Attending: Cardiovascular Disease | Admitting: Cardiovascular Disease

## 2015-10-30 VITALS — BP 140/64 | HR 74 | Temp 98.6°F | Wt 158.0 lb

## 2015-10-30 DIAGNOSIS — R600 Localized edema: Secondary | ICD-10-CM | POA: Diagnosis not present

## 2015-10-30 DIAGNOSIS — R7989 Other specified abnormal findings of blood chemistry: Secondary | ICD-10-CM

## 2015-10-30 DIAGNOSIS — R799 Abnormal finding of blood chemistry, unspecified: Secondary | ICD-10-CM | POA: Diagnosis not present

## 2015-10-30 DIAGNOSIS — I10 Essential (primary) hypertension: Secondary | ICD-10-CM | POA: Diagnosis not present

## 2015-10-30 DIAGNOSIS — E785 Hyperlipidemia, unspecified: Secondary | ICD-10-CM | POA: Insufficient documentation

## 2015-10-30 LAB — COMPREHENSIVE METABOLIC PANEL
ALT: 19 U/L (ref 0–53)
AST: 18 U/L (ref 0–37)
Albumin: 4.2 g/dL (ref 3.5–5.2)
Alkaline Phosphatase: 51 U/L (ref 39–117)
BILIRUBIN TOTAL: 0.5 mg/dL (ref 0.2–1.2)
BUN: 22 mg/dL (ref 6–23)
CALCIUM: 9.9 mg/dL (ref 8.4–10.5)
CO2: 26 meq/L (ref 19–32)
CREATININE: 1.28 mg/dL (ref 0.40–1.50)
Chloride: 100 mEq/L (ref 96–112)
GFR: 56.09 mL/min — ABNORMAL LOW (ref >60.00)
GLUCOSE: 90 mg/dL (ref 70–99)
Potassium: 3.7 mEq/L (ref 3.5–5.1)
SODIUM: 136 meq/L (ref 135–145)
Total Protein: 7.8 g/dL (ref 6.0–8.3)

## 2015-10-30 LAB — CBC
HCT: 42.3 % (ref 39.0–52.0)
HEMOGLOBIN: 14.1 g/dL (ref 13.0–17.0)
MCHC: 33.4 g/dL (ref 30.0–36.0)
MCV: 96.2 fl (ref 78.0–100.0)
Platelets: 288 10*3/uL (ref 150.0–400.0)
RBC: 4.4 Mil/uL (ref 4.22–5.81)
RDW: 14.1 % (ref 11.5–15.5)
WBC: 8 10*3/uL (ref 4.0–10.5)

## 2015-10-30 LAB — TSH: TSH: 1.1 u[IU]/mL (ref 0.35–4.50)

## 2015-10-30 NOTE — Telephone Encounter (Signed)
Andre [name] from [place] states no DVT was found on pt Venous Duplex.

## 2015-10-30 NOTE — Patient Instructions (Addendum)
Need to rule out blood clot as cause. We will call you within next hour or two- our hope is to do your scan today.   Also get labs to look at other causes of swelling  If negative for clots, we can consider prescription grade compression stockings.

## 2015-10-30 NOTE — Progress Notes (Signed)
Garret Reddish, MD  Subjective:  Andre [name] is a 81 y.o. year old very pleasant male patient who presents for/with See problem oriented charting ROS- No chest pain or shortness of breath. No headache or blurry vision. See below as well  Past Medical History-  Patient Active Problem List   Diagnosis Date Noted  . CKD (chronic kidney disease), stage III 02/17/2015    Priority: Medium  . Malignant neoplasm of prostate ([place]) 01/05/2010    Priority: Medium  . Hyperlipidemia 12/04/2007    Priority: Medium  . Essential hypertension 12/04/2007    Priority: Medium  . Macular degeneration 02/17/2015    Priority: Low  . Former smoker 02/17/2015    Priority: Low  . History of skin cancer     Priority: Low  . Angiodysplasia of intestine with hemorrhage 05/15/2007    Priority: Low    Medications- reviewed and updated Current Outpatient Prescriptions  Medication Sig Dispense Refill  . amLODipine (NORVASC) 10 MG tablet Take 1 tablet (10 mg total) by mouth daily. 90 tablet 3  . aspirin 81 MG tablet Take 81 mg by mouth daily.      . Calcium Carbonate-Vit D-Min (CALTRATE 600+D PLUS) 600-400 MG-UNIT per tablet Take 1 tablet by mouth daily.      Marland Kitchen losartan-hydrochlorothiazide (HYZAAR) 100-25 MG per tablet Take 1 tablet by mouth daily. 90 tablet 3  . lovastatin (MEVACOR) 40 MG tablet Take 1 tablet by mouth at  bedtime 90 tablet 3  . Multiple Vitamins-Minerals (ABC PLUS SENIOR) TABS Take 1 tablet by mouth daily.     No current facility-administered medications for this visit.    Objective: BP 140/64 mmHg  Pulse 74  Temp(Src) 98.6 F (37 C)  Wt 158 lb (71.668 kg) Gen: NAD, resting comfortably No thyromegaly CV: RRR no murmurs rubs or gallops Lungs: CTAB no crackles, wheeze, rhonchi Abdomen: soft/nontender/nondistended/normal bowel sounds. No rebound or guarding.  Ext: no edema 10 cm below tibial plateau- L 35.5 Cm, R 38.8 cm (and calf firmer on right) Skin: warm, dry Neuro:  grossly normal, moves all extremities  Assessment/Plan:  Edema R >>L Elevated pro BNP S: Started in October. Does not seem to be worsening. R ankle swelling in day, goes away at night but very quickly return in morning. Goes down at night almost goes away- Left leg goes all the way down but right leg edema remains. Has noted calf feels firm.  ROS- No leg pain but at times the right leg feels "tired" when swollen. No recent travel or surgery. Does have known prostate cancer. No chest pain or shortness of breath. No orthopnea or PND A/P: Initial venous duplex negative (given >3 cm difference in calves thought necessary to test). No anemia or thyroid issues and albumin normal. CKD Stage III likely. BNP did come back elevated but at age and with CKD tough to know if this is cause- will get echocardiogram  Return precautions advised.    Results for orders placed or performed in visit on 10/30/15 (from the past 24 hour(s))  Pro b natriuretic peptide     Status: Abnormal   Collection Time: 10/30/15 12:37 PM  Result Value Ref Range   Pro B Natriuretic peptide (BNP) 1564.00 (H) <451 pg/mL   Narrative   Performed at:  [facility], [id number]  [place], [place] [address]  CBC     Status: None   Collection Time: 10/30/15 12:37 PM  Result Value Ref Range   WBC 8.0 4.0 - 10.5 K/uL   RBC 4.40 4.22 - 5.81 Mil/uL   Platelets 288.0 150.0 - 400.0 K/uL   Hemoglobin 14.1 13.0 - 17.0 g/dL   HCT 42.3 39.0 - 52.0 %   MCV 96.2 78.0 - 100.0 fl   MCHC 33.4 30.0 - 36.0 g/dL   RDW 14.1 11.5 - 15.5 %  TSH     Status: None   Collection Time: 10/30/15 12:37 PM  Result Value Ref Range   TSH 1.10 0.35 - 4.50 uIU/mL  Comprehensive metabolic panel     Status: Abnormal   Collection Time: 10/30/15 12:37 PM  Result Value Ref Range   Sodium 136 135 - 145 mEq/L   Potassium 3.7 3.5 - 5.1 mEq/L   Chloride 100 96 - 112 mEq/L   CO2 26 19 - 32 mEq/L   Glucose, Bld 90 70  - 99 mg/dL   BUN 22 6 - 23 mg/dL   Creatinine, Ser 1.28 0.40 - 1.50 mg/dL   Total Bilirubin 0.5 0.2 - 1.2 mg/dL   Alkaline Phosphatase 51 39 - 117 U/L   AST 18 0 - 37 U/L   ALT 19 0 - 53 U/L   Total Protein 7.8 6.0 - 8.3 g/dL   Albumin 4.2 3.5 - 5.2 g/dL   Calcium 9.9 8.4 - 10.5 mg/dL   GFR 56.09 (L) >60.00 mL/min

## 2015-10-30 NOTE — Telephone Encounter (Signed)
Great news. We will await labs.

## 2015-10-31 LAB — PRO B NATRIURETIC PEPTIDE: Pro B Natriuretic peptide (BNP): 1564 pg/mL — ABNORMAL HIGH (ref <451)

## 2015-11-12 ENCOUNTER — Other Ambulatory Visit (HOSPITAL_COMMUNITY): Payer: Medicare Other

## 2015-11-16 ENCOUNTER — Other Ambulatory Visit (HOSPITAL_COMMUNITY): Payer: Medicare Other

## 2015-11-25 ENCOUNTER — Other Ambulatory Visit: Payer: Self-pay

## 2015-11-25 ENCOUNTER — Ambulatory Visit (HOSPITAL_COMMUNITY): Payer: Medicare Other | Attending: Family Medicine

## 2015-11-25 DIAGNOSIS — R609 Edema, unspecified: Secondary | ICD-10-CM | POA: Diagnosis not present

## 2015-11-25 DIAGNOSIS — I517 Cardiomegaly: Secondary | ICD-10-CM | POA: Diagnosis not present

## 2015-11-25 DIAGNOSIS — R799 Abnormal finding of blood chemistry, unspecified: Secondary | ICD-10-CM | POA: Diagnosis not present

## 2015-11-25 DIAGNOSIS — I340 Nonrheumatic mitral (valve) insufficiency: Secondary | ICD-10-CM | POA: Insufficient documentation

## 2015-11-25 DIAGNOSIS — R600 Localized edema: Secondary | ICD-10-CM | POA: Diagnosis not present

## 2015-11-25 DIAGNOSIS — I358 Other nonrheumatic aortic valve disorders: Secondary | ICD-10-CM | POA: Diagnosis not present

## 2015-11-25 DIAGNOSIS — R7989 Other specified abnormal findings of blood chemistry: Secondary | ICD-10-CM

## 2015-11-25 DIAGNOSIS — I071 Rheumatic tricuspid insufficiency: Secondary | ICD-10-CM | POA: Diagnosis not present

## 2015-11-25 DIAGNOSIS — I4891 Unspecified atrial fibrillation: Secondary | ICD-10-CM | POA: Insufficient documentation

## 2015-11-26 ENCOUNTER — Other Ambulatory Visit: Payer: Self-pay | Admitting: Family Medicine

## 2015-11-26 MED ORDER — FUROSEMIDE 20 MG PO TABS: 20.0000 mg | ORAL_TABLET | Freq: Every day | ORAL | Status: DC

## 2016-01-05 ENCOUNTER — Encounter: Payer: Self-pay | Admitting: Family Medicine

## 2016-01-05 ENCOUNTER — Ambulatory Visit (INDEPENDENT_AMBULATORY_CARE_PROVIDER_SITE_OTHER): Payer: Medicare Other | Admitting: Family Medicine

## 2016-01-05 VITALS — BP 132/64 | HR 79 | Temp 98.6°F | Wt 157.0 lb

## 2016-01-05 DIAGNOSIS — I5033 Acute on chronic diastolic (congestive) heart failure: Secondary | ICD-10-CM | POA: Insufficient documentation

## 2016-01-05 DIAGNOSIS — I5032 Chronic diastolic (congestive) heart failure: Secondary | ICD-10-CM | POA: Diagnosis not present

## 2016-01-05 DIAGNOSIS — Z23 Encounter for immunization: Secondary | ICD-10-CM | POA: Diagnosis not present

## 2016-01-05 LAB — BASIC METABOLIC PANEL
BUN: 26 mg/dL — AB (ref 6–23)
CALCIUM: 9.5 mg/dL (ref 8.4–10.5)
CHLORIDE: 99 meq/L (ref 96–112)
CO2: 27 mEq/L (ref 19–32)
CREATININE: 1.28 mg/dL (ref 0.40–1.50)
GFR: 56.06 mL/min — AB (ref >60.00)
Glucose, Bld: 105 mg/dL — ABNORMAL HIGH (ref 70–99)
Potassium: 3.9 mEq/L (ref 3.5–5.1)
Sodium: 135 mEq/L (ref 135–145)

## 2016-01-05 MED ORDER — FUROSEMIDE 20 MG PO TABS: 20.0000 mg | ORAL_TABLET | Freq: Every day | ORAL | Status: DC

## 2016-01-05 NOTE — Assessment & Plan Note (Signed)
S: We did a workup for edema about a month ago without clear cause. BNP mildly elevated and did echo. With EF Q000111Q suspect diastolic dysfunction though study was not technically sufficient to comment on diastolic function does have moderate tricuspid regurg as well. . We placed patient on lasix 20mg  every other day. He has done well with this with mild improvement in swelling. Hesitant to use more as also on hctz. He actually was not aware of shortness of breath before but since starting lasix he does comment that he is breathing better and realizes he may have been short of breath.  A/P:we will continue lasix 20mg  every other day. Having 1/2 a banana a day- we will check potassium to make sure maintaining.   I will also reach out to Dr. Meda Coffee who read echocardiogram and mentioned atrial fibrillation as cause for technically insufficient study. On my exams, patient has appeared to be in NSR. If was in fact in a fib, would need to discuss anticoagulation as chadsvasc at minimum would be 3, likely 20 (age +54, htn +1, CHF +1)

## 2016-01-05 NOTE — Patient Instructions (Signed)
Flu shot today (did the low dose due to prior reaction over 50 years ago.   Continue lasix every other day. Glad shortness of breath has improved. If you note this again or if you gain 3 lbs in a day or 5 lbs in 3 days or swelling worsens- please call me. We may increase your lasix to daily ( I wrote it as daily just in case we need to)  Make sure potassium normal- you may need to take extra due to the lasix but we will let you know

## 2016-01-05 NOTE — Progress Notes (Signed)
Garret Reddish, MD  Subjective:  Andre [name] is a 82 y.o. year old very pleasant male patient who presents for/with See problem oriented charting ROS- improved edema, improved/resolved shortness of breath (exercising 5 days a week). No chest pain.   Past Medical History-  Patient Active Problem List   Diagnosis Date Noted  . CKD (chronic kidney disease), stage III 02/17/2015    Priority: Medium  . Malignant neoplasm of prostate ([place]) 01/05/2010    Priority: Medium  . Hyperlipidemia 12/04/2007    Priority: Medium  . Essential hypertension 12/04/2007    Priority: Medium  . Macular degeneration 02/17/2015    Priority: Low  . Former smoker 02/17/2015    Priority: Low  . History of skin cancer     Priority: Low  . Angiodysplasia of intestine with hemorrhage 05/15/2007    Priority: Low  . Diastolic CHF, chronic ([place]) 01/05/2016    Medications- reviewed and updated Current Outpatient Prescriptions  Medication Sig Dispense Refill  . amLODipine (NORVASC) 10 MG tablet Take 1 tablet (10 mg total) by mouth daily. 90 tablet 3  . aspirin 81 MG tablet Take 81 mg by mouth daily.      . Calcium Carbonate-Vit D-Min (CALTRATE 600+D PLUS) 600-400 MG-UNIT per tablet Take 1 tablet by mouth daily.      . furosemide (LASIX) 20 MG tablet Take 1 tablet (20 mg total) by mouth daily. 90 tablet 3  . losartan-hydrochlorothiazide (HYZAAR) 100-25 MG per tablet Take 1 tablet by mouth daily. 90 tablet 3  . lovastatin (MEVACOR) 40 MG tablet Take 1 tablet by mouth at  bedtime 90 tablet 3  . Multiple Vitamins-Minerals (ABC PLUS SENIOR) TABS Take 1 tablet by mouth daily.     Objective: BP 132/64 mmHg  Pulse 79  Temp(Src) 98.6 F (37 C)  Wt 157 lb (71.215 kg) Gen: NAD, resting comfortably CV: RRR no murmurs rubs or gallops Lungs: CTAB no crackles, wheeze, rhonchi Abdomen: soft/nontender/nondistended/normal bowel sounds. No rebound or guarding.  Ext: 1+ pitting edema R>L 10 cm below tibial plateau-  L 35 Cm, R 38cm (and calf firmer on right) but all improved from previous Skin: warm, dry Neuro: grossly normal, moves all extremities  Assessment/Plan:  Diastolic CHF, chronic (HCC) S: We did a workup for edema about a month ago without clear cause. BNP mildly elevated and did echo. With EF Q000111Q suspect diastolic dysfunction though study was not technically sufficient to comment on diastolic function does have moderate tricuspid regurg as well. . We placed patient on lasix 20mg  every other day. He has done well with this with mild improvement in swelling. Hesitant to use more as also on hctz. He actually was not aware of shortness of breath before but since starting lasix he does comment that he is breathing better and realizes he may have been short of breath.  A/P:we will continue lasix 20mg  every other day. Having 1/2 a banana a day- we will check potassium to make sure maintaining.   I will also reach out to Dr. Meda Coffee who read echocardiogram and mentioned atrial fibrillation as cause for technically insufficient study. On my exams, patient has appeared to be in NSR. If was in fact in a fib, would need to discuss anticoagulation as chadsvasc at minimum would be 3, likely 11 (age +41, htn +1, CHF +1)    Return in about 6 months (around 07/04/2016) for annual wellness visit. Return precautions advised.   Orders Placed This Encounter  Procedures  . Flu  Vaccine QUAD 36+ mos IM  . Basic metabolic panel    [place]   Meds ordered this encounter  Medications  . furosemide (LASIX) 20 MG tablet    Sig: Take 1 tablet (20 mg total) by mouth daily.    Dispense:  90 tablet    Refill:  3

## 2016-01-06 ENCOUNTER — Other Ambulatory Visit: Payer: Self-pay

## 2016-01-06 MED ORDER — AMLODIPINE BESYLATE 10 MG PO TABS: 10.0000 mg | ORAL_TABLET | Freq: Every day | ORAL | Status: DC

## 2016-01-06 MED ORDER — LOSARTAN POTASSIUM-HCTZ 100-25 MG PO TABS: 1.0000 | ORAL_TABLET | Freq: Every day | ORAL | Status: DC

## 2016-01-06 MED ORDER — LOVASTATIN 40 MG PO TABS: ORAL_TABLET | ORAL | Status: DC

## 2016-02-23 ENCOUNTER — Encounter: Payer: Medicare Other | Admitting: Family Medicine

## 2016-03-16 ENCOUNTER — Ambulatory Visit (INDEPENDENT_AMBULATORY_CARE_PROVIDER_SITE_OTHER): Payer: Medicare Other | Admitting: Family Medicine

## 2016-03-16 ENCOUNTER — Encounter: Payer: Self-pay | Admitting: Family Medicine

## 2016-03-16 VITALS — BP 138/64 | HR 73 | Temp 98.4°F | Wt 157.0 lb

## 2016-03-16 DIAGNOSIS — I4891 Unspecified atrial fibrillation: Secondary | ICD-10-CM | POA: Diagnosis not present

## 2016-03-16 DIAGNOSIS — I5033 Acute on chronic diastolic (congestive) heart failure: Secondary | ICD-10-CM | POA: Diagnosis not present

## 2016-03-16 DIAGNOSIS — R0602 Shortness of breath: Secondary | ICD-10-CM | POA: Diagnosis not present

## 2016-03-16 DIAGNOSIS — I499 Cardiac arrhythmia, unspecified: Secondary | ICD-10-CM | POA: Diagnosis not present

## 2016-03-16 LAB — BASIC METABOLIC PANEL
BUN: 24 mg/dL — AB (ref 6–23)
CHLORIDE: 99 meq/L (ref 96–112)
CO2: 26 mEq/L (ref 19–32)
CREATININE: 1.2 mg/dL (ref 0.40–1.50)
Calcium: 9.3 mg/dL (ref 8.4–10.5)
GFR: 60.37 mL/min (ref >60.00)
GLUCOSE: 93 mg/dL (ref 70–99)
POTASSIUM: 3.7 meq/L (ref 3.5–5.1)
Sodium: 134 mEq/L — ABNORMAL LOW (ref 135–145)

## 2016-03-16 MED ORDER — POTASSIUM CHLORIDE ER 10 MEQ PO TBCR: 10.0000 meq | EXTENDED_RELEASE_TABLET | Freq: Every day | ORAL | Status: DC

## 2016-03-16 MED ORDER — RIVAROXABAN 15 MG PO TABS: 15.0000 mg | ORAL_TABLET | Freq: Every day | ORAL | Status: DC

## 2016-03-16 NOTE — Patient Instructions (Signed)
Labs before you leave  Stop aspirin. Start xarelto 15mg  this evening (usually would use 15 but given age and current kidney function using slightly reduced dose)  Start taking lasix 40mg  (2 pills a day) until you see cardiology. Take potassium pill 10 meq tablet I sent in as well.   We will call you today or tomorrow about your cardiology appointment. I want you seen within a week.   If you have worsening symptoms after hours- seek care or if during office hours can see me first but would want to do this urgently.

## 2016-03-16 NOTE — Progress Notes (Addendum)
Garret Reddish, MD  Subjective:  Andre [name] is a 82 y.o. year old very pleasant male patient who presents for/with See problem oriented charting ROS- some shortness of breath if talks for prolonged period, shortness of breath with exercise that would normally not bother him, no chest pain. No fever, chills. No sinus pressure.   Past Medical History-  Patient Active Problem List   Diagnosis Date Noted  . CKD (chronic kidney disease), stage III 02/17/2015    Priority: Medium  . Malignant neoplasm of prostate ([place]) 01/05/2010    Priority: Medium  . Hyperlipidemia 12/04/2007    Priority: Medium  . Essential hypertension 12/04/2007    Priority: Medium  . Macular degeneration 02/17/2015    Priority: Low  . Former smoker 02/17/2015    Priority: Low  . History of skin cancer     Priority: Low  . Angiodysplasia of intestine with hemorrhage 05/15/2007    Priority: Low  . Diastolic CHF, chronic ([place]) 01/05/2016    Medications- reviewed and updated Current Outpatient Prescriptions  Medication Sig Dispense Refill  . amLODipine (NORVASC) 10 MG tablet Take 1 tablet (10 mg total) by mouth daily. 90 tablet 3  . Calcium Carbonate-Vit D-Min (CALTRATE 600+D PLUS) 600-400 MG-UNIT per tablet Take 1 tablet by mouth daily.      . furosemide (LASIX) 20 MG tablet Take 1 tablet (20 mg total) by mouth daily. 90 tablet 3  . losartan-hydrochlorothiazide (HYZAAR) 100-25 MG tablet Take 1 tablet by mouth daily. 90 tablet 3  . lovastatin (MEVACOR) 40 MG tablet Take 1 tablet by mouth at  bedtime 90 tablet 3  . Multiple Vitamins-Minerals (ABC PLUS SENIOR) TABS Take 1 tablet by mouth daily.    Diona Fanti 81mg  before visit  30 tablet 5   No current facility-administered medications for this visit.    Objective: BP 138/64 mmHg  Pulse 73  Temp(Src) 98.4 F (36.9 C)  Wt 157 lb (71.215 kg) Gen: NAD, resting comfortably CV:  Irregularly irregular (first time noted on exam)no murmurs rubs or gallops Lungs:  crackles at bilateral bases, wheeze, rhonchi Abdomen: soft/nontender/nondistended/normal bowel sounds.  Ext: 1+ edema (right greater than left) Skin: warm, dry, no rash Neuro: grossly normal, moves all extremities  EKG: atrial fibrillation with rate 70, normal qt interval, qrs prolonged  Assessment/Plan:  Atrial fibrillation Shortness of breath - Plan: Ambulatory referral to Cardiology Acute on chronic diastolic congestive heart failure ([place])  S: Patient presents today stating for last 2-3 weeks he has had more shortness of breath and lower stamina. Has some dry cough as well. Has gained about 3 lbs on his home scales after previously losing weight. Bringing trashcans up driveway made him short of breath Monday. 2 flights of stairs carrying laundry makes him huff and puff and that is not normal for him- usually these activities do not bother him. Riding 1.5 miles on a bike has to stop 2-3 miles when used to ride 2-3 miles without issue.  Of note patient had echocardiogram 11/25/15 and had EF Q000111Q and diastolic CHF was presumed. I had never known patient to be in a fib but this was noted by cardiology. Reached out to cadiology by epic  to see if confident in this diagnosis. I did not call to follow up on epic message. Did not do EKG at last visit either as that comment was overlooked at time of visit.   A/P: Today patient is in Atrial fibrillation by EKG. He has crackles on his lung,  stable edema, and recent weight gain. Will increase lasix to 40mg  daily from 20mg  every other day and start potassium 10 meq. Will start xarelto 15 mg (GFR listed as 55 recently but given age and recent lasix use could be lower so will use lower dose). Urgent referral to cardiology to be seen within a week. Shortness of breath could be due to the atrial fibrillation or potential fluid retention due to ineffective pumping from a fib or possible diastolic CHF- ask for their help in teasing this issue out. BMET today- may  need higher potassium dose potentially. He is eating 1/2 a banana a day. May need to stop hctz if needs to remain on higher dose lasix.   In addition, I apologized to patient for not following up on my epic message to cardiology about the atrial fibrillation previously and for not having him in sooner. He was doing well at last visit on 20mg  lasix and I overlooked potential A fib on echo note so EKG had not been done at that time to discover issue.   We discussed if worsening shortness of breath such as with rest to go to the emergency room after hours or seek care immediately during office hours.   Orders Placed This Encounter  Procedures  . Basic metabolic panel    [place]  . Ambulatory referral to Cardiology    Referral Priority:  Urgent    Referral Type:  Consultation    Referral Reason:  Specialty Services Required    Requested Specialty:  Cardiology    Number of Visits Requested:  1  . EKG 12-Lead    Meds ordered this encounter  Medications  . Rivaroxaban (XARELTO) 15 MG TABS tablet    Sig: Take 1 tablet (15 mg total) by mouth daily with supper.    Dispense:  30 tablet    Refill:  5  . potassium chloride (K-DUR) 10 MEQ tablet    Sig: Take 1 tablet (10 mEq total) by mouth daily.    Dispense:  30 tablet    Refill:  5

## 2016-03-16 NOTE — Progress Notes (Signed)
HPI: 82 year old male for evaluation of atrial fibrillation and diastolic congestive heart failure. Echocardiogram December 2016 showed vigorous LV function. There was mild left atrial enlargement and mild mitral regurgitation. Moderate tricuspid regurgitation with moderately elevated pulmonary pressures. Patient has had some pedal edema since December. Over the past 4 weeks he has noticed progressive dyspnea on exertion. Orthopnea or PND. No chest pain, palpitations or syncope. He was noted to be in atrial fibrillation and cardiology asked to evaluate.  Current Outpatient Prescriptions  Medication Sig Dispense Refill  . amLODipine (NORVASC) 10 MG tablet Take 1 tablet (10 mg total) by mouth daily. 90 tablet 3  . Calcium Carbonate-Vit D-Min (CALTRATE 600+D PLUS) 600-400 MG-UNIT per tablet Take 1 tablet by mouth daily.      . furosemide (LASIX) 20 MG tablet Take 20 mg by mouth 2 (two) times daily.    Marland Kitchen losartan-hydrochlorothiazide (HYZAAR) 100-25 MG tablet Take 1 tablet by mouth daily. 90 tablet 3  . lovastatin (MEVACOR) 40 MG tablet Take 1 tablet by mouth at  bedtime 90 tablet 3  . Multiple Vitamins-Minerals (ABC PLUS SENIOR) TABS Take 1 tablet by mouth daily.    . potassium chloride (K-DUR) 10 MEQ tablet Take 1 tablet (10 mEq total) by mouth daily. 30 tablet 5  . Rivaroxaban (XARELTO) 15 MG TABS tablet Take 1 tablet (15 mg total) by mouth daily with supper. 30 tablet 5   No current facility-administered medications for this visit.    No Known Allergies   Past Medical History  Diagnosis Date  . Adenomatous colon polyp 04/1985    no further colonoscopy, 2008-last colonoscopy, no polyps    . Hyperlipidemia   . Hypertension   . PAT (paroxysmal atrial tachycardia) (HCC)     many years ago, worse with smoking  . Macular degeneration     bilateral  . Diverticulosis   . History of skin cancer     dermatology every 6 months Dr. [name]  . Atrial fibrillation ([place])   .  Nephrolithiasis     Past Surgical History  Procedure Laterality Date  . Cataract extraction      bilateral  . Inguinal hernia repair    . Tendon repair  12/11    right leg    Social History   Social History  . Marital Status: Married    Spouse Name: N/A  . Number of Children: 5  . Years of Education: N/A   Occupational History  . RETIRED    Social History Main Topics  . Smoking status: Former Smoker -- 1.00 packs/day for 60 years    Types: Cigarettes    Quit date: 12/13/2003  . Smokeless tobacco: Never Used  . Alcohol Use: 0.0 oz/week    0 Standard drinks or equivalent per week     Comment: 2 glass of wine a day  . Drug Use: No  . Sexual Activity: Not on file   Other Topics Concern  . Not on file   Social History Narrative   Married (64 years in 08/2015-goes to outside practice). 5 children. 8 grandchildren (lost 1 grandchild #9 to [place] 18,  Lost #9 to seizures)      Retired at [employer], high Cabin crew      Hobbies: time with family, go to beach (51 years in a row), travel    Family History  Problem Relation Age of Onset  . Hypertension Mother   . Cancer Father     lung  ROS: no fevers or chills, productive cough, hemoptysis, dysphasia, odynophagia, melena, hematochezia, dysuria, hematuria, rash, seizure activity, orthopnea, PND, claudication. Remaining systems are negative.  Physical Exam:   Blood pressure 132/56, pulse 80, height 5\' 5"  (1.651 m), weight 156 lb (70.761 kg).  General:  Well developed/well nourished in NAD Skin warm/dry Patient not depressed No peripheral clubbing Back-normal HEENT-normal/normal eyelids Neck supple/normal carotid upstroke bilaterally; right carotid bruit; no JVD; no thyromegaly chest - CTA/ normal expansion CV - irregular/normal S1 and S2; no rubs or gallops;  PMI nondisplaced; 2/6 systolic murmur LSB Abdomen -NT/ND, no HSM, no mass, + bowel sounds, positive bruit 2+ femoral pulses, bilateral  bruits Ext-2+ ankle edema, no chords Neuro-grossly nonfocal  ECG 03/16/2016-atrial fibrillation, right bundle branch block, left anterior fascicular block.

## 2016-03-17 ENCOUNTER — Other Ambulatory Visit: Payer: Self-pay | Admitting: Cardiology

## 2016-03-17 ENCOUNTER — Ambulatory Visit (INDEPENDENT_AMBULATORY_CARE_PROVIDER_SITE_OTHER): Payer: Medicare Other | Admitting: Cardiology

## 2016-03-17 ENCOUNTER — Encounter: Payer: Self-pay | Admitting: Cardiology

## 2016-03-17 VITALS — BP 132/56 | HR 80 | Ht 65.0 in | Wt 156.0 lb

## 2016-03-17 DIAGNOSIS — R0989 Other specified symptoms and signs involving the circulatory and respiratory systems: Secondary | ICD-10-CM

## 2016-03-17 DIAGNOSIS — I4891 Unspecified atrial fibrillation: Secondary | ICD-10-CM | POA: Insufficient documentation

## 2016-03-17 DIAGNOSIS — I480 Paroxysmal atrial fibrillation: Secondary | ICD-10-CM

## 2016-03-17 NOTE — Patient Instructions (Addendum)
Medication Instructions:  Your physician recommends that you continue on your current medications as directed. Please refer to the Current Medication list given to you today. MAKE SURE YOU CONTINUE TAKING YOU XARELTO   Labwork: Your physician recommends that you return for lab work in: Monday 4/10 (BMET,CBC, TSH) The lab can be found on the FIRST FLOOR of out building in [address]   Testing/Procedures: Your physician has requested that you have a TEE/Cardioversion. During a TEE, sound waves are used to create images of your heart. It provides your doctor with information about the size and shape of your heart and how well your heart's chambers and valves are working. In this test, a transducer is attached to the end of a flexible tube that is guided down you throat and into your esophagus (the tube leading from your mouth to your stomach) to get a more detailed image of your heart. Once the TEE has determined that a blood clot is not present, the cardioversion begins. Electrical Cardioversion uses a jolt of electricity to your heart either through paddles or wired patches attached to your chest. This is a controlled, usually prescheduled, procedure. This procedure is done at the hospital and you are not awake during the procedure. You usually go home the day of the procedure. Please see the instruction sheet given to you today for more information.    Your physician has requested that you have an abdominal aorta duplex. During this test, an ultrasound is used to evaluate the aorta. Allow 30 minutes for this exam. Do not eat after midnight the day before and avoid carbonated beverages    Follow-Up: Your physician recommends that you schedule a follow-up appointment in: [place] DR. CRENSHAW   Any Other Special Instructions Will Be Listed Below (If Applicable).     If you need a refill on your cardiac medications before your next appointment, please call your pharmacy.

## 2016-03-17 NOTE — Assessment & Plan Note (Signed)
Patient is volume overloaded. His Lasix was increased to 40 mg daily recently. We will continue with that dose and check potassium and renal function on April 10.

## 2016-03-17 NOTE — Assessment & Plan Note (Signed)
Blood pressure controlled. Continue present medications. 

## 2016-03-17 NOTE — Assessment & Plan Note (Signed)
Continue statin. 

## 2016-03-17 NOTE — Assessment & Plan Note (Signed)
Abdominal ultrasound to exclude aneurysm. 

## 2016-03-17 NOTE — Assessment & Plan Note (Signed)
Patient has newly diagnosed atrial fibrillation. Duration is unknown but is likely the cause of his new onset diastolic congestive heart failure. He is symptomatic with increased dyspnea on exertion and bilateral lower extremity edema. His rate is controlled on no medications. Embolic risk factors include age greater than 38 and hypertension. CHADSvasc 3. Continue xarelto 15 mg daily; GFR 41. Check TSH. Given that he is symptomatic we will arrange TEE guided cardioversion next week. Hopefully he will hold sinus rhythm and his CHF symptoms will improve. If atrial fibrillation recurs we will add an antiarrhythmic to help hold sinus rhythm. Note he does have a bifascicular block and we will need to follow closely for bradycardia.

## 2016-03-18 ENCOUNTER — Telehealth: Payer: Self-pay | Admitting: Cardiology

## 2016-03-18 NOTE — Telephone Encounter (Signed)
Spoke with pt, early this morning he coughed up mucus and it did have some blood in it. It was a small amt and it has not happened since this morning. He does report post nasal drip and coughing up mucus is not uncommon for him. Reassurance given to the pt, he will cont to monitor and let us know if changes.

## 2016-03-18 NOTE — Telephone Encounter (Signed)
New Message:   Pt said he was her yesterday and Dr Stanford Breed ask him if he was coughing up blood,he said no. Pt said last night he started coughing up blood. Please call.

## 2016-03-18 NOTE — Telephone Encounter (Signed)
Left message for pt to call.

## 2016-03-18 NOTE — Telephone Encounter (Signed)
Follow up ° ° ° ° ° °Returning a call to the nurse °

## 2016-03-21 LAB — CBC WITH DIFFERENTIAL/PLATELET
BASOS ABS: 77 {cells}/uL (ref 0–200)
Basophils Relative: 1 %
Eosinophils Absolute: 154 cells/uL (ref 15–500)
Eosinophils Relative: 2 %
HEMATOCRIT: 38.5 % (ref 38.5–50.0)
HEMOGLOBIN: 12.8 g/dL — AB (ref 13.2–17.1)
LYMPHS ABS: 1771 {cells}/uL (ref 850–3900)
LYMPHS PCT: 23 %
MCH: 30.9 pg (ref 27.0–33.0)
MCHC: 33.2 g/dL (ref 32.0–36.0)
MCV: 93 fL (ref 80.0–100.0)
MONO ABS: 616 {cells}/uL (ref 200–950)
MPV: 9.7 fL (ref 7.5–12.5)
Monocytes Relative: 8 %
NEUTROS PCT: 66 %
Neutro Abs: 5082 cells/uL (ref 1500–7800)
Platelets: 371 10*3/uL (ref 140–400)
RBC: 4.14 MIL/uL — ABNORMAL LOW (ref 4.20–5.80)
RDW: 14.2 % (ref 11.0–15.0)
WBC: 7.7 10*3/uL (ref 3.8–10.8)

## 2016-03-22 ENCOUNTER — Ambulatory Visit (HOSPITAL_BASED_OUTPATIENT_CLINIC_OR_DEPARTMENT_OTHER): Payer: Medicare Other

## 2016-03-22 ENCOUNTER — Encounter (HOSPITAL_COMMUNITY)
Admission: RE | Disposition: A | Discharge status: Home / Self Care | Payer: Self-pay | Source: Ambulatory Visit | Attending: Cardiology

## 2016-03-22 ENCOUNTER — Ambulatory Visit (HOSPITAL_COMMUNITY): Payer: Medicare Other | Admitting: Anesthesiology

## 2016-03-22 ENCOUNTER — Encounter (HOSPITAL_COMMUNITY): Payer: Self-pay

## 2016-03-22 ENCOUNTER — Ambulatory Visit (HOSPITAL_COMMUNITY)
Admission: RE | Admit: 2016-03-22 | Discharge: 2016-03-22 | Disposition: A | Discharge status: Home / Self Care | Payer: Medicare Other | Source: Ambulatory Visit | Attending: Cardiology | Admitting: Cardiology

## 2016-03-22 DIAGNOSIS — I481 Persistent atrial fibrillation: Secondary | ICD-10-CM | POA: Diagnosis not present

## 2016-03-22 DIAGNOSIS — E785 Hyperlipidemia, unspecified: Secondary | ICD-10-CM | POA: Diagnosis not present

## 2016-03-22 DIAGNOSIS — Z85828 Personal history of other malignant neoplasm of skin: Secondary | ICD-10-CM | POA: Insufficient documentation

## 2016-03-22 DIAGNOSIS — I340 Nonrheumatic mitral (valve) insufficiency: Secondary | ICD-10-CM

## 2016-03-22 DIAGNOSIS — Z87891 Personal history of nicotine dependence: Secondary | ICD-10-CM | POA: Diagnosis not present

## 2016-03-22 DIAGNOSIS — Z7901 Long term (current) use of anticoagulants: Secondary | ICD-10-CM | POA: Diagnosis not present

## 2016-03-22 DIAGNOSIS — I10 Essential (primary) hypertension: Secondary | ICD-10-CM | POA: Insufficient documentation

## 2016-03-22 DIAGNOSIS — I480 Paroxysmal atrial fibrillation: Secondary | ICD-10-CM

## 2016-03-22 DIAGNOSIS — I4819 Other persistent atrial fibrillation: Secondary | ICD-10-CM | POA: Insufficient documentation

## 2016-03-22 HISTORY — PX: TEE WITHOUT CARDIOVERSION: SHX5443

## 2016-03-22 LAB — BASIC METABOLIC PANEL
BUN: 32 mg/dL — AB (ref 7–25)
CO2: 24 mmol/L (ref 20–31)
Calcium: 9.1 mg/dL (ref 8.6–10.3)
Chloride: 102 mmol/L (ref 98–110)
Creat: 1.36 mg/dL — ABNORMAL HIGH (ref 0.70–1.11)
GLUCOSE: 132 mg/dL — AB (ref 65–99)
POTASSIUM: 3.9 mmol/L (ref 3.5–5.3)
SODIUM: 136 mmol/L (ref 135–146)

## 2016-03-22 LAB — TSH: TSH: 0.97 mIU/L (ref 0.40–4.50)

## 2016-03-22 SURGERY — ECHOCARDIOGRAM, TRANSESOPHAGEAL
Anesthesia: Monitor Anesthesia Care

## 2016-03-22 MED ORDER — BUTAMBEN-TETRACAINE-BENZOCAINE 2-2-14 % EX AERO
INHALATION_SPRAY | CUTANEOUS | Status: DC | PRN
  Administered 2016-03-22: 2 via TOPICAL

## 2016-03-22 MED ORDER — SODIUM CHLORIDE 0.9 % IV SOLN: INTRAVENOUS | Status: DC

## 2016-03-22 MED ORDER — SODIUM CHLORIDE 0.9 % IV SOLN
INTRAVENOUS | Status: DC | PRN
  Administered 2016-03-22 (×2): via INTRAVENOUS

## 2016-03-22 MED ORDER — PROPOFOL 500 MG/50ML IV EMUL
INTRAVENOUS | Status: DC | PRN
  Administered 2016-03-22: 75 ug/kg/min via INTRAVENOUS

## 2016-03-22 NOTE — Discharge Instructions (Signed)
Transesophageal Echocardiogram °Transesophageal echocardiography (TEE) is a picture test of your heart using sound waves. The pictures taken can give very detailed pictures of your heart. This can help your doctor see if there are problems with your heart. TEE can check: °· If your heart has blood clots in it. °· How well your heart valves are working. °· If you have an infection on the inside of your heart. °· Some of the major arteries of your heart. °· If your heart valve is working after a repair. °· Your heart before a procedure that uses a shock to your heart to get the rhythm back to normal. °BEFORE THE PROCEDURE °· Do not eat or drink for 6 hours before the procedure or as told by your doctor. °· Make plans to have someone drive you home after the procedure. Do not drive yourself home. °· An IV tube will be put in your arm. °PROCEDURE °· You will be given a medicine to help you relax (sedative). It will be given through the IV tube. °· A numbing medicine will be sprayed or gargled in the back of your throat to help numb it. °· The tip of the probe is placed into the back of your mouth. You will be asked to swallow. This helps to pass the probe into your esophagus. °· Once the tip of the probe is in the right place, your doctor can take pictures of your heart. °· You may feel pressure at the back of your throat. °AFTER THE PROCEDURE °· You will be taken to a recovery area so the sedative can wear off. °· Your throat may be sore and scratchy. This will go away slowly over time. °· You will go home when you are fully awake and able to swallow liquids. °· You should have someone stay with you for the next 24 hours. °· Do not drive or operate machinery for the next 24 hours. °  °This information is not intended to replace advice given to you by your health care provider. Make sure you discuss any questions you have with your health care provider. °  °Document Released: 09/25/2009 Document Revised: 12/03/2013  Document Reviewed: 05/30/2013 °Elsevier Interactive Patient Education ©2016 [employer] ° °Monitored Anesthesia Care °Monitored anesthesia care is an anesthesia service for a medical procedure. Anesthesia is the loss of the ability to feel pain. It is produced by medicines called anesthetics. It may affect a small area of your body (local anesthesia), a large area of your body (regional anesthesia), or your entire body (general anesthesia). The need for monitored anesthesia care depends your procedure, your condition, and the potential need for regional or general anesthesia. It is often provided during procedures where:  °· General anesthesia may be needed if there are complications. This is because you need special care when you are under general anesthesia.   °· You will be under local or regional anesthesia. This is so that you are able to have higher levels of anesthesia if needed.   °· You will receive calming medicines (sedatives). This is especially the case if sedatives are given to put you in a semi-conscious state of relaxation (deep sedation). This is because the amount of sedative needed to produce this state can be hard to predict. Too much of a sedative can produce general anesthesia. °Monitored anesthesia care is performed by one or more health care providers who have special training in all types of anesthesia. You will need to meet with these health care providers before   your procedure. During this meeting, they will ask you about your medical history. They will also give you instructions to follow. (For example, you will need to stop eating and drinking before your procedure. You may also need to stop or change medicines you are taking.) During your procedure, your health care providers will stay with you. They will:  °· Watch your condition. This includes watching your blood pressure, breathing, and level of pain.   °· Diagnose and treat problems that occur.   °· Give medicines if they are  needed. These may include calming medicines (sedatives) and anesthetics.   °· Make sure you are comfortable.   °Having monitored anesthesia care does not necessarily mean that you will be under anesthesia. It does mean that your health care providers will be able to manage anesthesia if you need it or if it occurs. It also means that you will be able to have a different type of anesthesia than you are having if you need it. When your procedure is complete, your health care providers will continue to watch your condition. They will make sure any medicines wear off before you are allowed to go home.  °  °This information is not intended to replace advice given to you by your health care provider. Make sure you discuss any questions you have with your health care provider. °  °Document Released: 08/24/2005 Document Revised: 12/19/2014 Document Reviewed: 01/09/2013 °Elsevier Interactive Patient Education ©2016 Elsevier Inc. ° °

## 2016-03-22 NOTE — Anesthesia Postprocedure Evaluation (Signed)
Anesthesia Post Note  Patient: Andre [name]  Procedure(s) Performed: Procedure(s) (LRB): TRANSESOPHAGEAL ECHOCARDIOGRAM (TEE) (N/A)  Patient location during evaluation: PACU Anesthesia Type: MAC Level of consciousness: awake and alert Pain management: pain level controlled Vital Signs Assessment: post-procedure vital signs reviewed and stable Respiratory status: spontaneous breathing Cardiovascular status: stable Anesthetic complications: no    Last Vitals:  Filed Vitals:   03/22/16 0920 03/22/16 0930  BP: 109/44 137/51  Pulse: 57 38  Resp: 18 19    Last Pain: There were no vitals filed for this visit.               Nolon Nations

## 2016-03-22 NOTE — CV Procedure (Signed)
Transesophageal echocardiogram  Indication: Persistent atrial fibrillation, rule out intracardiac thrombus prior to elective cardioversion  Description of procedure: Informed consent was obtained. Patient taken to procedure suite. Timeout performed. Sedation was provided by the anesthesia team in anticipation of potential elective cardioversion. Transesophageal echocardiogram probe passed into esophagus following adequate sedation. Multiple images were obtained - please refer to final report for details. In summary LVEF is normal range. There is mild mitral regurgitation. Moderate to severe degree of spontaneous echo-contrast noted within the left atrial appendage consistent with low flow and confirmed by pulse Doppler imaging. Intermittently seen echodensity at tip of left atrial appendage suspicious for thrombus perhaps in association with pectinate muscle. No right atrial appendage thrombus noted. No obvious PFO by color Doppler imaging of the interatrial septum. Descending aorta shows severe diffuse atherosclerotic disease. Patient tolerated the procedure well without immediate complication.  Disposition: Procedure was completed after transesophageal echocardiogram. Electrical cardioversion deferred at this point pending further anticoagulation on Xarelto. Could consider one month anticoagulation course with repeat attempt at cardioversion at that time. Patient will follow-up with [name] for further evaluation and management.  [name], M.D., F.A.C.C.

## 2016-03-22 NOTE — H&P (View-Only) (Signed)
HPI: 82 year old male for evaluation of atrial fibrillation and diastolic congestive heart failure. Echocardiogram December 2016 showed vigorous LV function. There was mild left atrial enlargement and mild mitral regurgitation. Moderate tricuspid regurgitation with moderately elevated pulmonary pressures. Patient has had some pedal edema since December. Over the past 4 weeks he has noticed progressive dyspnea on exertion. Orthopnea or PND. No chest pain, palpitations or syncope. He was noted to be in atrial fibrillation and cardiology asked to evaluate.  Current Outpatient Prescriptions  Medication Sig Dispense Refill  . amLODipine (NORVASC) 10 MG tablet Take 1 tablet (10 mg total) by mouth daily. 90 tablet 3  . Calcium Carbonate-Vit D-Min (CALTRATE 600+D PLUS) 600-400 MG-UNIT per tablet Take 1 tablet by mouth daily.      . furosemide (LASIX) 20 MG tablet Take 20 mg by mouth 2 (two) times daily.    Marland Kitchen losartan-hydrochlorothiazide (HYZAAR) 100-25 MG tablet Take 1 tablet by mouth daily. 90 tablet 3  . lovastatin (MEVACOR) 40 MG tablet Take 1 tablet by mouth at  bedtime 90 tablet 3  . Multiple Vitamins-Minerals (ABC PLUS SENIOR) TABS Take 1 tablet by mouth daily.    . potassium chloride (K-DUR) 10 MEQ tablet Take 1 tablet (10 mEq total) by mouth daily. 30 tablet 5  . Rivaroxaban (XARELTO) 15 MG TABS tablet Take 1 tablet (15 mg total) by mouth daily with supper. 30 tablet 5   No current facility-administered medications for this visit.    No Known Allergies   Past Medical History  Diagnosis Date  . Adenomatous colon polyp 04/1985    no further colonoscopy, 2008-last colonoscopy, no polyps    . Hyperlipidemia   . Hypertension   . PAT (paroxysmal atrial tachycardia) (HCC)     many years ago, worse with smoking  . Macular degeneration     bilateral  . Diverticulosis   . History of skin cancer     dermatology every 6 months Dr. [name]  . Atrial fibrillation ([place])   .  Nephrolithiasis     Past Surgical History  Procedure Laterality Date  . Cataract extraction      bilateral  . Inguinal hernia repair    . Tendon repair  12/11    right leg    Social History   Social History  . Marital Status: Married    Spouse Name: N/A  . Number of Children: 5  . Years of Education: N/A   Occupational History  . RETIRED    Social History Main Topics  . Smoking status: Former Smoker -- 1.00 packs/day for 60 years    Types: Cigarettes    Quit date: 12/13/2003  . Smokeless tobacco: Never Used  . Alcohol Use: 0.0 oz/week    0 Standard drinks or equivalent per week     Comment: 2 glass of wine a day  . Drug Use: No  . Sexual Activity: Not on file   Other Topics Concern  . Not on file   Social History Narrative   Married (64 years in 08/2015-goes to outside practice). 5 children. 8 grandchildren (lost 1 grandchild #9 to [place] 18,  Lost #9 to seizures)      Retired at [employer], high Cabin crew      Hobbies: time with family, go to beach (51 years in a row), travel    Family History  Problem Relation Age of Onset  . Hypertension Mother   . Cancer Father     lung  ROS: no fevers or chills, productive cough, hemoptysis, dysphasia, odynophagia, melena, hematochezia, dysuria, hematuria, rash, seizure activity, orthopnea, PND, claudication. Remaining systems are negative.  Physical Exam:   Blood pressure 132/56, pulse 80, height 5\' 5"  (1.651 m), weight 156 lb (70.761 kg).  General:  Well developed/well nourished in NAD Skin warm/dry Patient not depressed No peripheral clubbing Back-normal HEENT-normal/normal eyelids Neck supple/normal carotid upstroke bilaterally; right carotid bruit; no JVD; no thyromegaly chest - CTA/ normal expansion CV - irregular/normal S1 and S2; no rubs or gallops;  PMI nondisplaced; 2/6 systolic murmur LSB Abdomen -NT/ND, no HSM, no mass, + bowel sounds, positive bruit 2+ femoral pulses, bilateral  bruits Ext-2+ ankle edema, no chords Neuro-grossly nonfocal  ECG 03/16/2016-atrial fibrillation, right bundle branch block, left anterior fascicular block.

## 2016-03-22 NOTE — Interval H&P Note (Signed)
Diastolic CHF, chronic (HCC) - Lelon Perla, MD at 03/17/2016 8:25 AM     Status: Written Related Problem: Diastolic CHF, chronic ([place])   Expand All Collapse All   Patient is volume overloaded. His Lasix was increased to 40 mg daily recently. We will continue with that dose and check potassium and renal function on April 10.            Essential hypertension - Lelon Perla, MD at 03/17/2016 8:26 AM     Status: Written Related Problem: Essential hypertension   Expand All Collapse All   Blood pressure controlled. Continue present medications.            Hyperlipidemia - Lelon Perla, MD at 03/17/2016 8:26 AM     Status: Written Related Problem: Hyperlipidemia   Expand All Collapse All   Continue statin.            Atrial fibrillation (HCC) - Lelon Perla, MD at 03/17/2016 8:29 AM     Status: Written Related Problem: Atrial fibrillation [facility])   Expand All Collapse All   Patient has newly diagnosed atrial fibrillation. Duration is unknown but is likely the cause of his new onset diastolic congestive heart failure. He is symptomatic with increased dyspnea on exertion and bilateral lower extremity edema. His rate is controlled on no medications. Embolic risk factors include age greater than 73 and hypertension. CHADSvasc 3. Continue xarelto 15 mg daily; GFR 41. Check TSH. Given that he is symptomatic we will arrange TEE guided cardioversion next week. Hopefully he will hold sinus rhythm and his CHF symptoms will improve. If atrial fibrillation recurs we will add an antiarrhythmic to help hold sinus rhythm. Note he does have a bifascicular block and we will need to follow closely for bradycardia.            Bruit - Lelon Perla, MD at 03/17/2016 8:30 AM       History and Physical Interval Note:  Patient seen and examined. Reviewed records including Dr. Jacalyn Lefevre recent office note as outlined above with plan for TEE guided cardioversion of  persistent symptomatic atrial fibrillation. He has been on Xarelto consistently since last week. Hemoglobin 12.8, platelets 371, creatinine 1.3. Patient denies any swallowing difficulties or esophageal pathology. I reviewed his medications, he is not on any specific heart rate lowering drugs and has evidence of conduction system disease with right bundle branch block and left anterior fascicular block. This increases his risk of bradycardia and heart block post cardioversion. Essential risks and anticipated benefits of TEE and direct current cardioversion were discussed with the patient, his wife, and daughter present. Informed consent obtained. He is in agreement to proceed.   03/22/2016 8:02 AM  Rozann Lesches

## 2016-03-22 NOTE — Anesthesia Procedure Notes (Signed)
Procedure Name: MAC Date/Time: 03/22/2016 8:36 AM Performed by: Carney Living Oxygen Delivery Method: Nasal cannula

## 2016-03-22 NOTE — Progress Notes (Signed)
Echocardiogram Echocardiogram Transesophageal has been performed.  Andre [name] 03/22/2016, 9:32 AM

## 2016-03-22 NOTE — Anesthesia Preprocedure Evaluation (Addendum)
Anesthesia Evaluation  Patient identified by MRN, date of birth, ID band Patient awake    Reviewed: Allergy & Precautions, NPO status , Patient's Chart, lab work & pertinent test results  Airway Mallampati: II  TM Distance: >3 FB Neck ROM: Full    Dental no notable dental hx.    Pulmonary neg pulmonary ROS, former smoker,    Pulmonary exam normal breath sounds clear to auscultation       Cardiovascular hypertension, +CHF  Normal cardiovascular exam Rhythm:Regular Rate:Normal     Neuro/Psych negative neurological ROS  negative psych ROS   GI/Hepatic negative GI ROS, Neg liver ROS,   Endo/Other  negative endocrine ROS  Renal/GU Renal disease     Musculoskeletal negative musculoskeletal ROS (+)   Abdominal   Peds  Hematology negative hematology ROS (+)   Anesthesia Other Findings   Reproductive/Obstetrics                             Anesthesia Physical Anesthesia Plan  ASA: III  Anesthesia Plan: MAC   Post-op Pain Management:    Induction: Intravenous  Airway Management Planned:   Additional Equipment:   Intra-op Plan:   Post-operative Plan:   Informed Consent: I have reviewed the patients History and Physical, chart, labs and discussed the procedure including the risks, benefits and alternatives for the proposed anesthesia with the patient or authorized representative who has indicated his/her understanding and acceptance.   Dental advisory given  Plan Discussed with: CRNA  Anesthesia Plan Comments:         Anesthesia Quick Evaluation

## 2016-03-22 NOTE — Transfer of Care (Signed)
Immediate Anesthesia Transfer of Care Note  Patient: Andre [name]  Procedure(s) Performed: Procedure(s): TRANSESOPHAGEAL ECHOCARDIOGRAM (TEE) (N/A)  Patient Location: Endoscopy Unit  Anesthesia Type:MAC  Level of Consciousness: awake, alert , oriented and patient cooperative  Airway & Oxygen Therapy: Patient Spontanous Breathing and Patient connected to nasal cannula oxygen  Post-op Assessment: Report given to RN, Post -op Vital signs reviewed and stable and Patient moving all extremities X 4  Post vital signs: Reviewed and stable  Last Vitals:  Filed Vitals:   03/22/16 0733 03/22/16 0912  BP: 136/53 108/37  Pulse: 68 66  Resp: 25 22    Complications: No apparent anesthesia complications

## 2016-03-23 ENCOUNTER — Other Ambulatory Visit: Payer: Self-pay | Admitting: *Deleted

## 2016-03-23 DIAGNOSIS — Z79899 Other long term (current) drug therapy: Secondary | ICD-10-CM

## 2016-03-24 ENCOUNTER — Encounter (HOSPITAL_COMMUNITY): Payer: Self-pay | Admitting: Cardiology

## 2016-03-30 ENCOUNTER — Ambulatory Visit (HOSPITAL_COMMUNITY)
Admission: RE | Admit: 2016-03-30 | Discharge: 2016-03-30 | Disposition: A | Discharge status: Home / Self Care | Payer: Medicare Other | Source: Ambulatory Visit | Attending: Internal Medicine | Admitting: Internal Medicine

## 2016-03-30 DIAGNOSIS — E785 Hyperlipidemia, unspecified: Secondary | ICD-10-CM | POA: Insufficient documentation

## 2016-03-30 DIAGNOSIS — R0989 Other specified symptoms and signs involving the circulatory and respiratory systems: Secondary | ICD-10-CM | POA: Insufficient documentation

## 2016-03-30 DIAGNOSIS — I708 Atherosclerosis of other arteries: Secondary | ICD-10-CM | POA: Diagnosis not present

## 2016-03-30 DIAGNOSIS — I4891 Unspecified atrial fibrillation: Secondary | ICD-10-CM

## 2016-03-30 DIAGNOSIS — I700 Atherosclerosis of aorta: Secondary | ICD-10-CM | POA: Insufficient documentation

## 2016-03-30 DIAGNOSIS — I10 Essential (primary) hypertension: Secondary | ICD-10-CM | POA: Diagnosis not present

## 2016-03-31 ENCOUNTER — Telehealth: Payer: Self-pay | Admitting: Family Medicine

## 2016-03-31 MED ORDER — RIVAROXABAN 15 MG PO TABS: 15.0000 mg | ORAL_TABLET | Freq: Every day | ORAL | Status: DC

## 2016-03-31 NOTE — Telephone Encounter (Signed)
Pt.notified

## 2016-03-31 NOTE — Telephone Encounter (Signed)
Pt states on 03/16/16 you put him on Lasix 2 times a day and he has lost 10lbs since being on that twice a day. He wants to know if you still want him to take 40mg  a day or less? He thinks the lasix is causing him to lose weight.

## 2016-03-31 NOTE — Telephone Encounter (Signed)
Let's go back to once a day then have him schedule a follow up for sometime next week

## 2016-03-31 NOTE — Telephone Encounter (Signed)
Pt would like a call back to discuss a possible med change. His cardiologist advised pt give Dr Yong Channel a call.

## 2016-04-05 ENCOUNTER — Encounter: Payer: Self-pay | Admitting: Family Medicine

## 2016-04-05 ENCOUNTER — Ambulatory Visit (INDEPENDENT_AMBULATORY_CARE_PROVIDER_SITE_OTHER): Payer: Medicare Other | Admitting: Family Medicine

## 2016-04-05 VITALS — BP 132/60 | HR 74 | Temp 97.7°F | Wt 149.0 lb

## 2016-04-05 DIAGNOSIS — I10 Essential (primary) hypertension: Secondary | ICD-10-CM

## 2016-04-05 DIAGNOSIS — I5032 Chronic diastolic (congestive) heart failure: Secondary | ICD-10-CM

## 2016-04-05 NOTE — Assessment & Plan Note (Signed)
S: controlled on repeat on amldoipine 10mg , losartan 100mg , hctz 25mg  BP Readings from Last 3 Encounters:  04/05/16 132/60  03/22/16 137/51  03/17/16 132/56  A/P:Continue current meds:  Also has bmet later this week with cardiology

## 2016-04-05 NOTE — Progress Notes (Signed)
Subjective:  Andre [name] is a 82 y.o. year old very pleasant male patient who presents for/with See problem oriented charting ROS- has not gone back to regular exercise but no chest pain or shortness of breath with activities he completes, no palpitations or abnormal fatigue.   Past Medical History-  Patient Active Problem List   Diagnosis Date Noted  . Diastolic CHF, chronic ([place]) 01/05/2016    Priority: Medium  . CKD (chronic kidney disease), stage III 02/17/2015    Priority: Medium  . Malignant neoplasm of prostate ([place]) 01/05/2010    Priority: Medium  . Hyperlipidemia 12/04/2007    Priority: Medium  . Essential hypertension 12/04/2007    Priority: Medium  . Macular degeneration 02/17/2015    Priority: Low  . Former smoker 02/17/2015    Priority: Low  . History of skin cancer     Priority: Low  . Angiodysplasia of intestine with hemorrhage 05/15/2007    Priority: Low  . Persistent atrial fibrillation ([place])   . Atrial fibrillation ([place]) 03/17/2016  . Bruit 03/17/2016    Medications- reviewed and updated Current Outpatient Prescriptions  Medication Sig Dispense Refill  . amLODipine (NORVASC) 10 MG tablet Take 1 tablet (10 mg total) by mouth daily. 90 tablet 3  . Calcium Carbonate-Vit D-Min (CALTRATE 600+D PLUS) 600-400 MG-UNIT per tablet Take 1 tablet by mouth daily.      . furosemide (LASIX) 20 MG tablet Take 20 mg by mouth daily.     Marland Kitchen losartan-hydrochlorothiazide (HYZAAR) 100-25 MG tablet Take 1 tablet by mouth daily. 90 tablet 3  . lovastatin (MEVACOR) 40 MG tablet Take 1 tablet by mouth at  bedtime (Patient taking differently: Take 40 mg by mouth daily. ) 90 tablet 3  . Multiple Vitamin (MULTIVITAMIN WITH MINERALS) TABS tablet Take 1 tablet by mouth daily. ABC Plus Senior    . potassium chloride (K-DUR) 10 MEQ tablet Take 1 tablet (10 mEq total) by mouth daily. (Patient taking differently: Take 10 mEq by mouth daily with supper. ) 30 tablet 5  . Rivaroxaban  (XARELTO) 15 MG TABS tablet Take 1 tablet (15 mg total) by mouth daily with supper. 90 tablet 3   No current facility-administered medications for this visit.    Objective: BP 132/60 mmHg  Pulse 74  Temp(Src) 97.7 F (36.5 C)  Wt 149 lb (67.586 kg) Gen: NAD, resting comfortably CV: irregularly irregular no murmurs rubs or gallops Lungs: CTAB no crackles, wheeze, rhonchi Abdomen: soft/nontender/nondistended/normal bowel sounds. No rebound or guarding.  Ext: no edema on left, trace on right Skin: warm, dry, no rash Neuro: grossly normal, moves all extremities  Assessment/Plan:  Diastolic CHF, chronic (HCC) S: Patient was found to be in atrial fibrillation on 03/16/16 and was quickly worked into cardiology. He had signs of fluid overload as well with known diastolic dysfunction and lasix was increasd to 40mg (2 pills a day) short term.  Since being on lasix twice a day he reports losing 10 lbs at home. On 4/20 we went back to once a day and then planned for follow up today. Since he went back to once a day gaining about 0.5 lbs per day. No chest pain or shortness of breath or worsening edema. He remains in a fib as could not have cardioversion due to clot burden in atrium.  Wt Readings from Last 3 Encounters:  04/05/16 149 lb (67.586 kg)  03/22/16 154 lb (69.854 kg)  03/17/16 156 lb (70.761 kg)  A/P: we will change dose to  20mg  and 40mg  on alternating days. Has follow up with cards on 11th of may but will update me next week.   Essential hypertension S: controlled on repeat on amldoipine 10mg , losartan 100mg , hctz 25mg  BP Readings from Last 3 Encounters:  04/05/16 132/60  03/22/16 137/51  03/17/16 132/56  A/P:Continue current meds:  Also has bmet later this week with cardiology    Return precautions advised.   Garret Reddish, MD

## 2016-04-05 NOTE — Assessment & Plan Note (Signed)
S: Patient was found to be in atrial fibrillation on 03/16/16 and was quickly worked into cardiology. He had signs of fluid overload as well with known diastolic dysfunction and lasix was increasd to 40mg (2 pills a day) short term.  Since being on lasix twice a day he reports losing 10 lbs at home. On 4/20 we went back to once a day and then planned for follow up today. Since he went back to once a day gaining about 0.5 lbs per day. No chest pain or shortness of breath or worsening edema. He remains in a fib as could not have cardioversion due to clot burden in atrium.  Wt Readings from Last 3 Encounters:  04/05/16 149 lb (67.586 kg)  03/22/16 154 lb (69.854 kg)  03/17/16 156 lb (70.761 kg)  A/P: we will change dose to 20mg  and 40mg  on alternating days. Has follow up with cards on 11th of may but will update me next week.

## 2016-04-05 NOTE — Patient Instructions (Addendum)
Change lasix to 2 pills every other day with 1 pill on days in between. Update me on your weight in about a week. Also make sure no increase in swelling or shortness of breath and let us know if this happens.    No other changes in medicine

## 2016-04-07 LAB — BASIC METABOLIC PANEL
BUN: 26 mg/dL — ABNORMAL HIGH (ref 7–25)
CALCIUM: 9.4 mg/dL (ref 8.6–10.3)
CO2: 26 mmol/L (ref 20–31)
Chloride: 100 mmol/L (ref 98–110)
Creat: 1.33 mg/dL — ABNORMAL HIGH (ref 0.70–1.11)
GLUCOSE: 134 mg/dL — AB (ref 65–99)
POTASSIUM: 4.2 mmol/L (ref 3.5–5.3)
SODIUM: 136 mmol/L (ref 135–146)

## 2016-04-13 NOTE — Progress Notes (Signed)
HPI: FU atrial fibrillation and diastolic congestive heart failure. Echocardiogram December 2016 showed vigorous LV function. There was mild left atrial enlargement and mild mitral regurgitation. Moderate tricuspid regurgitation with moderately elevated pulmonary pressures. Patient scheduled for TEE guided cardioversion April 2017. This showed normal LV function, mild mitral regurgitation, left atrial appendage thrombus. Cardioversion canceled. Abdominal ultrasound April 2017 showed no aneurysm. There was greater than 50% bilateral common iliac stenosis and greater than 50% right external iliac stenosis. Since last seen, He has some dyspnea on exertion but improved. No orthopnea, PND, chest pain or syncope. Mild pedal edema.  Current Outpatient Prescriptions  Medication Sig Dispense Refill  . amLODipine (NORVASC) 10 MG tablet Take 1 tablet (10 mg total) by mouth daily. 90 tablet 3  . Calcium Carbonate-Vit D-Min (CALTRATE 600+D PLUS) 600-400 MG-UNIT per tablet Take 1 tablet by mouth daily.      . furosemide (LASIX) 20 MG tablet Take 20 mg by mouth daily.     Marland Kitchen losartan-hydrochlorothiazide (HYZAAR) 100-25 MG tablet Take 1 tablet by mouth daily. 90 tablet 3  . lovastatin (MEVACOR) 40 MG tablet Take 40 mg by mouth daily.    . Multiple Vitamin (MULTIVITAMIN WITH MINERALS) TABS tablet Take 1 tablet by mouth daily. ABC Plus Senior    . potassium chloride (K-DUR) 10 MEQ tablet Take 10 mEq by mouth daily.    . Rivaroxaban (XARELTO) 15 MG TABS tablet Take 1 tablet (15 mg total) by mouth daily with supper. 90 tablet 3   No current facility-administered medications for this visit.     Past Medical History  Diagnosis Date  . Adenomatous colon polyp 04/1985    no further colonoscopy, 2008-last colonoscopy, no polyps    . Hyperlipidemia   . Hypertension   . PAT (paroxysmal atrial tachycardia) (HCC)     many years ago, worse with smoking  . Macular degeneration     bilateral  . Diverticulosis     . History of skin cancer     dermatology every 6 months Dr. [name]  . Atrial fibrillation ([place])   . Nephrolithiasis     Past Surgical History  Procedure Laterality Date  . Cataract extraction      bilateral  . Inguinal hernia repair    . Tendon repair  12/11    right leg  . Tee without cardioversion N/A 03/22/2016    Procedure: TRANSESOPHAGEAL ECHOCARDIOGRAM (TEE);  Surgeon: [name], MD;  Location[facility] ENDOSCOPY;  Service: Cardiovascular;  Laterality: N/A;    Social History   Social History  . Marital Status: Married    Spouse Name: N/A  . Number of Children: 5  . Years of Education: N/A   Occupational History  . RETIRED    Social History Main Topics  . Smoking status: Former Smoker -- 1.00 packs/day for 60 years    Types: Cigarettes    Quit date: 12/13/2003  . Smokeless tobacco: Never Used  . Alcohol Use: 0.0 oz/week    0 Standard drinks or equivalent per week     Comment: 2 glass of wine a day  . Drug Use: No  . Sexual Activity: Not on file   Other Topics Concern  . Not on file   Social History Narrative   Married (64 years in 08/2015-goes to outside practice). 5 children. 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 Hypertension Mother   . Cancer Father     lung    ROS: Some leg weakness but no fevers or chills, productive cough, hemoptysis, dysphasia, odynophagia, melena, hematochezia, dysuria, hematuria, rash, seizure activity, orthopnea, PND,  claudication. Remaining systems are negative.  Physical Exam: Well-developed well-nourished in no acute distress.  Skin is warm and dry.  HEENT is normal.  Neck is supple.  Chest is clear to auscultation with normal expansion.  Cardiovascular exam is irregular Abdominal exam nontender or  distended. No masses palpated. Extremities show 1+ edema. neuro grossly intact  ECG Atrial fibrillation at a rate of 70. Right bundle branch block. Left anterior fascicular block. Occasional PVC or aberrantly conducted beat.

## 2016-04-14 ENCOUNTER — Telehealth: Payer: Self-pay | Admitting: Family Medicine

## 2016-04-14 NOTE — Telephone Encounter (Signed)
Pt would like for you to give him a call it is personal.

## 2016-04-14 NOTE — Telephone Encounter (Signed)
Pt states you wanted him to update you on his weight, he states his weight is going up and down from 1lbs-1.5lbs a day for the past week.

## 2016-04-14 NOTE — Telephone Encounter (Signed)
Appears to be more stable than previous- continue current dose until cards visit next week

## 2016-04-15 ENCOUNTER — Telehealth: Payer: Self-pay | Admitting: *Deleted

## 2016-04-15 NOTE — Telephone Encounter (Signed)
PA for xarelto sent via cover my meds  [place]

## 2016-04-21 ENCOUNTER — Ambulatory Visit (INDEPENDENT_AMBULATORY_CARE_PROVIDER_SITE_OTHER): Payer: Medicare Other | Admitting: Cardiology

## 2016-04-21 ENCOUNTER — Encounter: Payer: Self-pay | Admitting: Cardiology

## 2016-04-21 VITALS — BP 136/60 | HR 70 | Ht 65.0 in | Wt 149.0 lb

## 2016-04-21 DIAGNOSIS — R0602 Shortness of breath: Secondary | ICD-10-CM

## 2016-04-21 DIAGNOSIS — I5032 Chronic diastolic (congestive) heart failure: Secondary | ICD-10-CM

## 2016-04-21 DIAGNOSIS — I481 Persistent atrial fibrillation: Secondary | ICD-10-CM

## 2016-04-21 DIAGNOSIS — I4891 Unspecified atrial fibrillation: Secondary | ICD-10-CM

## 2016-04-21 DIAGNOSIS — I4819 Other persistent atrial fibrillation: Secondary | ICD-10-CM

## 2016-04-21 DIAGNOSIS — I739 Peripheral vascular disease, unspecified: Secondary | ICD-10-CM | POA: Insufficient documentation

## 2016-04-21 DIAGNOSIS — I10 Essential (primary) hypertension: Secondary | ICD-10-CM

## 2016-04-21 MED ORDER — FUROSEMIDE 20 MG PO TABS: 40.0000 mg | ORAL_TABLET | Freq: Every day | ORAL | Status: DC

## 2016-04-21 NOTE — Assessment & Plan Note (Signed)
Patient remains in atrial fibrillation. Transesophageal echocardiogram in early April showed thrombus. Continue xarelto. Plan to repeat TEE with possible cardioversion if thrombus has resolved in June. Hopefully this will help CHF symptoms. His rate is controlled on the medications.

## 2016-04-21 NOTE — Assessment & Plan Note (Signed)
Blood pressure controlled. Continue present medications. 

## 2016-04-21 NOTE — Patient Instructions (Signed)
Medication Instructions:   INCREASE FUROSEMIDE TO 40 MG ONCE DAILY= 2 OF THE 20 MG TABLETS ONCE DAILY  Labwork:  Your physician recommends that you return for lab work in: [place]  Testing/Procedures:  Your physician has requested that you have a TEE/Cardioversion. During a TEE, sound waves are used to create images of your heart. It provides your doctor with information about the size and shape of your heart and how well your heart's chambers and valves are working. In this test, a transducer is attached to the end of a flexible tube that is guided down you throat and into your esophagus (the tube leading from your mouth to your stomach) to get a more detailed image of your heart. Once the TEE has determined that a blood clot is not present, the cardioversion begins. Electrical Cardioversion uses a jolt of electricity to your heart either through paddles or wired patches attached to your chest. This is a controlled, usually prescheduled, procedure. This procedure is done at the hospital and you are not awake during the procedure. You usually go home the day of the procedure. Please see the instruction sheet given to you today for more information.  AROUND 05-22-16  Follow-Up:  Your physician recommends that you schedule a follow-up appointment in: [place]

## 2016-04-21 NOTE — Assessment & Plan Note (Signed)
He is volume overloaded on exam but improved. Change Lasix to 40 mg daily. In 1 week check potassium, renal function and BNP.

## 2016-04-21 NOTE — Assessment & Plan Note (Signed)
He has some leg weakness but denies claudication. Leg weakness may be related to atrial fibrillation and decreased cardiac output. We will address this after we take care of his atrial fibrillation.

## 2016-04-29 LAB — BRAIN NATRIURETIC PEPTIDE: Brain Natriuretic Peptide: 302 pg/mL — ABNORMAL HIGH (ref <100)

## 2016-04-29 LAB — BASIC METABOLIC PANEL
BUN: 34 mg/dL — AB (ref 7–25)
CHLORIDE: 96 mmol/L — AB (ref 98–110)
CO2: 26 mmol/L (ref 20–31)
CREATININE: 1.5 mg/dL — AB (ref 0.70–1.11)
Calcium: 9 mg/dL (ref 8.6–10.3)
Glucose, Bld: 140 mg/dL — ABNORMAL HIGH (ref 65–99)
Potassium: 3.7 mmol/L (ref 3.5–5.3)
Sodium: 133 mmol/L — ABNORMAL LOW (ref 135–146)

## 2016-05-04 ENCOUNTER — Encounter: Payer: Self-pay | Admitting: *Deleted

## 2016-05-04 ENCOUNTER — Telehealth: Payer: Self-pay | Admitting: *Deleted

## 2016-05-04 ENCOUNTER — Other Ambulatory Visit: Payer: Self-pay | Admitting: Cardiology

## 2016-05-04 DIAGNOSIS — I480 Paroxysmal atrial fibrillation: Secondary | ICD-10-CM

## 2016-05-04 NOTE — Telephone Encounter (Signed)
Spoke with pt, per dr Stanford Breed last office note pt scheduled for TEE DCCV 05-23-16 @ 12 noon with dr croitoru. Instructions discussed over the phone and letter mailed to the pt.

## 2016-05-23 ENCOUNTER — Ambulatory Visit (HOSPITAL_COMMUNITY): Payer: Medicare Other | Admitting: Anesthesiology

## 2016-05-23 ENCOUNTER — Other Ambulatory Visit: Payer: Self-pay

## 2016-05-23 ENCOUNTER — Encounter (HOSPITAL_COMMUNITY): Payer: Self-pay

## 2016-05-23 ENCOUNTER — Ambulatory Visit (HOSPITAL_COMMUNITY)
Admission: RE | Admit: 2016-05-23 | Discharge: 2016-05-23 | Disposition: A | Discharge status: Home / Self Care | Payer: Medicare Other | Source: Ambulatory Visit | Attending: Cardiovascular Disease | Admitting: Cardiovascular Disease

## 2016-05-23 ENCOUNTER — Ambulatory Visit (HOSPITAL_BASED_OUTPATIENT_CLINIC_OR_DEPARTMENT_OTHER)
Admission: RE | Admit: 2016-05-23 | Discharge: 2016-05-23 | Disposition: A | Discharge status: Home / Self Care | Payer: Medicare Other | Source: Ambulatory Visit | Attending: Cardiology | Admitting: Cardiology

## 2016-05-23 ENCOUNTER — Encounter (HOSPITAL_COMMUNITY)
Admission: RE | Disposition: A | Discharge status: Home / Self Care | Payer: Self-pay | Source: Ambulatory Visit | Attending: Cardiovascular Disease

## 2016-05-23 DIAGNOSIS — I509 Heart failure, unspecified: Secondary | ICD-10-CM | POA: Insufficient documentation

## 2016-05-23 DIAGNOSIS — I071 Rheumatic tricuspid insufficiency: Secondary | ICD-10-CM | POA: Insufficient documentation

## 2016-05-23 DIAGNOSIS — Z79899 Other long term (current) drug therapy: Secondary | ICD-10-CM | POA: Diagnosis not present

## 2016-05-23 DIAGNOSIS — I4819 Other persistent atrial fibrillation: Secondary | ICD-10-CM | POA: Insufficient documentation

## 2016-05-23 DIAGNOSIS — Z7901 Long term (current) use of anticoagulants: Secondary | ICD-10-CM | POA: Insufficient documentation

## 2016-05-23 DIAGNOSIS — I110 Hypertensive heart disease with heart failure: Secondary | ICD-10-CM | POA: Diagnosis not present

## 2016-05-23 DIAGNOSIS — I340 Nonrheumatic mitral (valve) insufficiency: Secondary | ICD-10-CM

## 2016-05-23 DIAGNOSIS — Z87891 Personal history of nicotine dependence: Secondary | ICD-10-CM | POA: Insufficient documentation

## 2016-05-23 DIAGNOSIS — E785 Hyperlipidemia, unspecified: Secondary | ICD-10-CM | POA: Diagnosis not present

## 2016-05-23 DIAGNOSIS — I480 Paroxysmal atrial fibrillation: Secondary | ICD-10-CM

## 2016-05-23 DIAGNOSIS — I4891 Unspecified atrial fibrillation: Secondary | ICD-10-CM

## 2016-05-23 DIAGNOSIS — I481 Persistent atrial fibrillation: Secondary | ICD-10-CM

## 2016-05-23 HISTORY — PX: CARDIOVERSION: SHX1299

## 2016-05-23 HISTORY — PX: TEE WITHOUT CARDIOVERSION: SHX5443

## 2016-05-23 LAB — POCT I-STAT 4, (NA,K, GLUC, HGB,HCT)
Glucose, Bld: 104 mg/dL — ABNORMAL HIGH (ref 65–99)
HCT: 39 % (ref 39.0–52.0)
Hemoglobin: 13.3 g/dL (ref 13.0–17.0)
POTASSIUM: 3.7 mmol/L (ref 3.5–5.1)
SODIUM: 138 mmol/L (ref 135–145)

## 2016-05-23 SURGERY — CARDIOVERSION
Anesthesia: Monitor Anesthesia Care

## 2016-05-23 MED ORDER — PROPOFOL 500 MG/50ML IV EMUL
INTRAVENOUS | Status: DC | PRN
  Administered 2016-05-23: 50 ug/kg/min via INTRAVENOUS

## 2016-05-23 MED ORDER — LIDOCAINE HCL (CARDIAC) 20 MG/ML IV SOLN
INTRAVENOUS | Status: DC | PRN
  Administered 2016-05-23: 20 mg via INTRATRACHEAL

## 2016-05-23 MED ORDER — SODIUM CHLORIDE 0.9 % IV SOLN
INTRAVENOUS | Status: DC
  Administered 2016-05-23: 12:00:00 via INTRAVENOUS
  Administered 2016-05-23: 1000 mL via INTRAVENOUS

## 2016-05-23 MED ORDER — BUTAMBEN-TETRACAINE-BENZOCAINE 2-2-14 % EX AERO
INHALATION_SPRAY | CUTANEOUS | Status: DC | PRN
  Administered 2016-05-23: 2 via TOPICAL

## 2016-05-23 NOTE — H&P (Signed)
Chief Complaint:  atrial fibrillation   HPI:  This is a 82 y.o. male with a past medical history significant for atrial fibrillation and left atrial thrombus on TEE, leading to cancellation of DC cardioversion one month ago. He returns for repeat TEE and cardioversion   PMHx:  Past Medical History  Diagnosis Date  . Adenomatous colon polyp 04/1985    no further colonoscopy, 2008-last colonoscopy, no polyps    . Hyperlipidemia   . Hypertension   . PAT (paroxysmal atrial tachycardia) (HCC)     many years ago, worse with smoking  . Macular degeneration     bilateral  . Diverticulosis   . History of skin cancer     dermatology every 6 months Dr. [name]  . Atrial fibrillation ([place])   . Nephrolithiasis     Past Surgical History  Procedure Laterality Date  . Cataract extraction      bilateral  . Inguinal hernia repair    . Tendon repair  12/11    right leg  . Tee without cardioversion N/A 03/22/2016    Procedure: TRANSESOPHAGEAL ECHOCARDIOGRAM (TEE);  Surgeon: [name], MD;  Location: [facility] ENDOSCOPY;  Service: Cardiovascular;  Laterality: N/A;    FAMHx:  Family History  Problem Relation Age of Onset  . Hypertension Mother   . Cancer Father     lung    SOCHx:   reports that he quit smoking about 12 years ago. His smoking use included Cigarettes. He has a 60 pack-year smoking history. He has never used smokeless tobacco. He reports that he drinks alcohol. He reports that he does not use illicit drugs.  ALLERGIES:  No Known Allergies  ROS: Pertinent items noted in HPI and remainder of comprehensive ROS otherwise negative.  HOME MEDS: Medications Prior to Admission  Medication Sig Dispense Refill  . amLODipine (NORVASC) 10 MG tablet Take 1 tablet (10 mg total) by mouth daily. 90 tablet 3  . Calcium Carbonate-Vit D-Min (CALTRATE 600+D PLUS) 600-400 MG-UNIT per tablet Take 1 tablet by mouth daily.      . furosemide (LASIX) 20 MG tablet Take 2 tablets (40 mg  total) by mouth daily. 180 tablet 3  . losartan-hydrochlorothiazide (HYZAAR) 100-25 MG tablet Take 1 tablet by mouth daily. 90 tablet 3  . lovastatin (MEVACOR) 40 MG tablet Take 40 mg by mouth daily.    . Multiple Vitamin (MULTIVITAMIN WITH MINERALS) TABS tablet Take 1 tablet by mouth daily. ABC Plus Senior    . potassium chloride (K-DUR) 10 MEQ tablet Take 10 mEq by mouth daily.    . Rivaroxaban (XARELTO) 15 MG TABS tablet Take 1 tablet (15 mg total) by mouth daily with supper. 90 tablet 3    LABS/IMAGING: Results for orders placed or performed during the hospital encounter of 05/23/16 (from the past 48 hour(s))  I-STAT 4, (NA,K, GLUC, HGB,HCT)     Status: Abnormal   Collection Time: 05/23/16 11:13 AM  Result Value Ref Range   Sodium 138 135 - 145 mmol/L   Potassium 3.7 3.5 - 5.1 mmol/L   Glucose, Bld 104 (H) 65 - 99 mg/dL   HCT 39.0 39.0 - 52.0 %   Hemoglobin 13.3 13.0 - 17.0 g/dL   No results found.  VITALS: Blood pressure 150/59, pulse 68, temperature 97.9 F (36.6 C), temperature source Oral, resp. rate 19, height 5' 5.75" (1.67 m), weight 67.132 kg (148 lb), SpO2 96 %.  EXAM:  General: Alert, oriented x3, no distress Head: no  evidence of trauma, PERRL, EOMI, no exophtalmos or lid lag, no myxedema, no xanthelasma; normal ears, nose and oropharynx Neck: normal jugular venous pulsations and no hepatojugular reflux; brisk carotid pulses without delay and no carotid bruits Chest: clear to auscultation, no signs of consolidation by percussion or palpation, normal fremitus, symmetrical and full respiratory excursions Cardiovascular: normal position and quality of the apical impulse, irregular rhythm, normal first heart sound and normal second heart sounds, no rubs or gallops, no murmur Abdomen: no tenderness or distention, no masses by palpation, no abnormal pulsatility or arterial bruits, normal bowel sounds, no hepatosplenomegaly Extremities: no clubbing, cyanosis or edema; 2+  radial, ulnar and brachial pulses bilaterally; 2+ right femoral, posterior tibial and dorsalis pedis pulses; 2+ left femoral, posterior tibial and dorsalis pedis pulses; no subclavian or femoral bruits Neurological: grossly nonfocal   IMPRESSION: Symptomatic persistent atrial fibrillation.  PLAN: Plan another TEE and if LA clot has resolved, cardioversion. This procedure has been fully reviewed with the patient and written informed consent has been obtained.   [name], MD, [facility] [phone] office [phone] pager  05/23/2016, 12:31 PM

## 2016-05-23 NOTE — Progress Notes (Signed)
Echocardiogram Echocardiogram Transesophageal has been performed.  Joelene Millin 05/23/2016, 12:47 PM

## 2016-05-23 NOTE — Transfer of Care (Signed)
Immediate Anesthesia Transfer of Care Note  Patient: Andre [name]  Procedure(s) Performed: Procedure(s): CARDIOVERSION (N/A) TRANSESOPHAGEAL ECHOCARDIOGRAM (TEE) (N/A)  Patient Location: PACU and Endoscopy Unit  Anesthesia Type:MAC  Level of Consciousness: awake, alert , patient cooperative and responds to stimulation  Airway & Oxygen Therapy: Patient Spontanous Breathing and Patient connected to nasal cannula oxygen  Post-op Assessment: Report given to RN and Post -op Vital signs reviewed and stable  Post vital signs: Reviewed and stable  Last Vitals:  Filed Vitals:   05/23/16 1233 05/23/16 1235  BP: 114/43 114/43  Pulse: 85 84  Temp: 36.4 C   Resp: 19 20    Last Pain: There were no vitals filed for this visit.       Complications: No apparent anesthesia complications

## 2016-05-23 NOTE — Anesthesia Preprocedure Evaluation (Signed)
Anesthesia Evaluation  Patient identified by MRN, date of birth, ID band Patient awake    Reviewed: Allergy & Precautions, NPO status , Patient's Chart, lab work & pertinent test results  Airway Mallampati: III  TM Distance: >3 FB Neck ROM: Full    Dental  (+) Dental Advisory Given   Pulmonary former smoker,    breath sounds clear to auscultation       Cardiovascular hypertension, Pt. on medications + Peripheral Vascular Disease and +CHF  + dysrhythmias  Rhythm:Irregular Rate:Normal     Neuro/Psych negative neurological ROS     GI/Hepatic negative GI ROS, Neg liver ROS,   Endo/Other  negative endocrine ROS  Renal/GU Renal disease     Musculoskeletal negative musculoskeletal ROS (+)   Abdominal   Peds  Hematology negative hematology ROS (+)   Anesthesia Other Findings   Reproductive/Obstetrics                             Anesthesia Physical Anesthesia Plan  ASA: III  Anesthesia Plan: MAC   Post-op Pain Management:    Induction: Intravenous  Airway Management Planned: Nasal Cannula and Natural Airway  Additional Equipment:   Intra-op Plan:   Post-operative Plan:   Informed Consent: I have reviewed the patients History and Physical, chart, labs and discussed the procedure including the risks, benefits and alternatives for the proposed anesthesia with the patient or authorized representative who has indicated his/her understanding and acceptance.     Plan Discussed with: CRNA  Anesthesia Plan Comments:         Anesthesia Quick Evaluation

## 2016-05-23 NOTE — Anesthesia Postprocedure Evaluation (Signed)
Anesthesia Post Note  Patient: Andre [name]  Procedure(s) Performed: Procedure(s) (LRB): CARDIOVERSION (N/A) TRANSESOPHAGEAL ECHOCARDIOGRAM (TEE) (N/A)  Patient location during evaluation: PACU Anesthesia Type: MAC Level of consciousness: awake and alert Pain management: pain level controlled Vital Signs Assessment: post-procedure vital signs reviewed and stable Respiratory status: spontaneous breathing, nonlabored ventilation, respiratory function stable and patient connected to nasal cannula oxygen Cardiovascular status: stable and blood pressure returned to baseline Anesthetic complications: no    Last Vitals:  Filed Vitals:   05/23/16 1300 05/23/16 1308  BP: 146/66 146/66  Pulse: 89 90  Temp:    Resp: 18 19    Last Pain: There were no vitals filed for this visit.               Tiajuana Amass

## 2016-05-23 NOTE — Discharge Instructions (Signed)
Electrical Cardioversion, Care After °Refer to this sheet in the next few weeks. These instructions provide you with information on caring for yourself after your procedure. Your health care provider may also give you more specific instructions. Your treatment has been planned according to current medical practices, but problems sometimes occur. Call your health care provider if you have any problems or questions after your procedure. °WHAT TO EXPECT AFTER THE PROCEDURE °After your procedure, it is typical to have the following sensations: °· Some redness on the skin where the shocks were delivered. If this is tender, a sunburn lotion or hydrocortisone cream may help. °· Possible return of an abnormal heart rhythm within hours or days after the procedure. °HOME CARE INSTRUCTIONS °· Take medicines only as directed by your health care provider. Be sure you understand how and when to take your medicine. °· Learn how to feel your pulse and check it often. °· Limit your activity for 48 hours after the procedure or as directed by your health care provider. °· Avoid or minimize caffeine and other stimulants as directed by your health care provider. °SEEK MEDICAL CARE IF: °· You feel like your heart is beating too fast or your pulse is not regular. °· You have any questions about your medicines. °· You have bleeding that will not stop. °SEEK IMMEDIATE MEDICAL CARE IF: °· You are dizzy or feel faint. °· It is hard to breathe or you feel short of breath. °· There is a change in discomfort in your chest. °· Your speech is slurred or you have trouble moving an arm or leg on one side of your body. °· You get a serious muscle cramp that does not go away. °· Your fingers or toes turn cold or blue. °  °This information is not intended to replace advice given to you by your health care provider. Make sure you discuss any questions you have with your health care provider. °  °Document Released: 09/18/2013 Document Revised: 12/19/2014  Document Reviewed: 09/18/2013 °Elsevier Interactive Patient Education ©2016 [employer] ° °

## 2016-05-23 NOTE — Op Note (Signed)
Procedure: Electrical Cardioversion Indications:  Atrial Fibrillation  Procedure Details:  Consent: Risks of procedure as well as the alternatives and risks of each were explained to the (patient/caregiver).  Consent for procedure obtained.  Time Out: Verified patient identification, verified procedure, site/side was marked, verified correct patient position, special equipment/implants available, medications/allergies/relevent history reviewed, required imaging and test results available.  Performed  Patient placed on cardiac monitor, pulse oximetry, supplemental oxygen as necessary.  Sedation given: IV propofol Pacer pads placed anterior and posterior chest.  Cardioverted 1 time(s).  Cardioversion with synchronized biphasic 120J shock.  Evaluation: Findings: Post procedure EKG shows: NSR Complications: None Patient did tolerate procedure well.  Time Spent Directly with the Patient:  30 minutes   Krista Som 05/23/2016, 12:29 PM

## 2016-05-23 NOTE — Op Note (Signed)
INDICATIONS: atrial fibrillation  PROCEDURE:   Informed consent was obtained prior to the procedure. The risks, benefits and alternatives for the procedure were discussed and the patient comprehended these risks.  Risks include, but are not limited to, cough, sore throat, vomiting, nausea, somnolence, esophageal and stomach trauma or perforation, bleeding, low blood pressure, aspiration, pneumonia, infection, trauma to the teeth and death.    After a procedural time-out, the oropharynx was anesthetized with 20% benzocaine spray.   During this procedure the patient was administered IV propofol (Anesthesiology. Dr. Deatra Canter) to achieve and maintain moderate conscious sedation.  The patient's heart rate, blood pressure, and oxygen saturationweare monitored continuously during the procedure.  The transesophageal probe was inserted in the esophagus and stomach without difficulty and multiple views were obtained.  The patient was kept under observation until the patient left the procedure room.  The patient left the procedure room in stable condition.   Agitated microbubble saline contrast was not administered.  COMPLICATIONS:    There were no immediate complications.  FINDINGS:  No LA thrombus  RECOMMENDATIONS:   Proceed wityh cardioversion  Time Spent Directly with the Patient:  30 minutes   Andre [name] 05/23/2016, 12:28 PM

## 2016-05-24 ENCOUNTER — Encounter (HOSPITAL_COMMUNITY): Payer: Self-pay | Admitting: Cardiovascular Disease

## 2016-07-12 NOTE — Progress Notes (Signed)
HPI: FU atrial fibrillation and diastolic congestive heart failure. Echocardiogram December 2016 showed vigorous LV function. There was mild left atrial enlargement and mild mitral regurgitation. Moderate tricuspid regurgitation with moderately elevated pulmonary pressures. Patient scheduled for TEE guided cardioversion April 2017. This showed normal LV function, mild mitral regurgitation, left atrial appendage thrombus. Cardioversion canceled. Abdominal ultrasound April 2017 showed no aneurysm. There was greater than 50% bilateral common iliac stenosis and greater than 50% right external iliac stenosis. TEE repeated June 2017 and showed normal LV systolic function, mild mitral regurgitation, mild to moderate left atrial enlargement but no thrombus, mild to moderate tricuspid regurgitation. Patient subsequently had successful cardioversion. Since last seen, He denies dyspnea with routine activities. No orthopnea or PND. Mild pedal edema. No chest pain. He continues to have pain in his legs bilaterally with ambulation.  Current Outpatient Prescriptions  Medication Sig Dispense Refill  . amLODipine (NORVASC) 10 MG tablet Take 1 tablet (10 mg total) by mouth daily. 90 tablet 3  . Calcium Carbonate-Vit D-Min (CALTRATE 600+D PLUS) 600-400 MG-UNIT per tablet Take 1 tablet by mouth daily.      . furosemide (LASIX) 20 MG tablet Take 2 tablets (40 mg total) by mouth daily. 180 tablet 3  . losartan-hydrochlorothiazide (HYZAAR) 100-25 MG tablet Take 1 tablet by mouth daily. 90 tablet 3  . lovastatin (MEVACOR) 40 MG tablet Take 40 mg by mouth daily.    . Multiple Vitamin (MULTIVITAMIN WITH MINERALS) TABS tablet Take 1 tablet by mouth daily. ABC Plus Senior    . potassium chloride (K-DUR) 10 MEQ tablet Take 10 mEq by mouth daily.    . Rivaroxaban (XARELTO) 15 MG TABS tablet Take 1 tablet (15 mg total) by mouth daily with supper. 90 tablet 3   No current facility-administered medications for this visit.       Past Medical History:  Diagnosis Date  . Adenomatous colon polyp 04/1985   no further colonoscopy, 2008-last colonoscopy, no polyps    . Atrial fibrillation ([place])   . Diverticulosis   . History of skin cancer    dermatology every 6 months Dr. [name]  . Hyperlipidemia   . Hypertension   . Macular degeneration    bilateral  . Nephrolithiasis   . PAT (paroxysmal atrial tachycardia) (HCC)    many years ago, worse with smoking    Past Surgical History:  Procedure Laterality Date  . CARDIOVERSION N/A 05/23/2016   Procedure: CARDIOVERSION;  Surgeon: [name], MD;  Location: [facility];  Service: Cardiovascular;  Laterality: N/A;  . CATARACT EXTRACTION     bilateral  . INGUINAL HERNIA REPAIR    . TEE WITHOUT CARDIOVERSION N/A 03/22/2016   Procedure: TRANSESOPHAGEAL ECHOCARDIOGRAM (TEE);  Surgeon: [name], MD;  Location: [place];  Service: Cardiovascular;  Laterality: N/A;  . TEE WITHOUT CARDIOVERSION N/A 05/23/2016   Procedure: TRANSESOPHAGEAL ECHOCARDIOGRAM (TEE);  Surgeon: [name], MD;  Location: [facility] ENDOSCOPY;  Service: Cardiovascular;  Laterality: N/A;  . TENDON REPAIR  12/11   right leg    Social History   Social History  . Marital status: Married    Spouse name: N/A  . Number of children: 5  . Years of education: N/A   Occupational History  . RETIRED Retired   Social History Main Topics  . Smoking status: Former Smoker    Packs/day: 1.00    Years: 60.00    Types: Cigarettes    Quit date: 12/13/2003  . Smokeless tobacco: Never Used  .  Alcohol use 0.0 oz/week     Comment: 2 glass of wine a day  . Drug use: No  . Sexual activity: Not on file   Other Topics Concern  . Not on file   Social History Narrative   Married (64 years in 08/2015-goes to outside practice). 5 children. 8 grandchildren (lost 1 grandchild #9 to [place] 18,  Lost #9 to seizures)      Retired at [employer], high Cabin crew      Hobbies: time with  family, go to beach (51 years in a row), travel    Family History  Problem Relation Age of Onset  . Hypertension Mother   . Cancer Father     lung    ROS: no fevers or chills, productive cough, hemoptysis, dysphasia, odynophagia, melena, hematochezia, dysuria, hematuria, rash, seizure activity, orthopnea, PND. Remaining systems are negative.  Physical Exam: Well-developed well-nourished in no acute distress.  Skin is warm and dry.  HEENT is normal.  Neck is supple.  Chest is clear to auscultation with normal expansion.  Cardiovascular exam is regular rate and rhythm.  Abdominal exam nontender or distended. No masses palpated. Extremities show 1 + ankle edema. neuro grossly intact  ECG Sinus rhythm at a rate of 74. First-degree AV block. Right bundle branch block. Left anterior persistent block. Left ventricular hypertrophy.  A/P  1 Paroxysmal atrial fibrillation-patient is status post TEE guided cardioversion. He remains in sinus rhythm and is symptomatically improved. If atrial fibrillation recurs we may need to add an antiarrhythmic to maintain sinus rhythm. Continue xarelto. Check renal function.  2 claudication/peripheral vascular disease-he continues to have claudication limiting his activities. I will ask Dr Fletcher Anon to review. Continue statin. Not on aspirin given need for anticoagulation.  3 chronic diastolic congestive heart failure-his volume status appears reasonable. Continue present dose of Lasix. Check potassium, renal function and BNP.  4 hypertension-blood pressure controlled. Continue present medications.  5 hyperlipidemia-continue statin.  Kirk Ruths, MD

## 2016-07-18 ENCOUNTER — Ambulatory Visit (INDEPENDENT_AMBULATORY_CARE_PROVIDER_SITE_OTHER): Payer: Medicare Other | Admitting: Cardiology

## 2016-07-18 ENCOUNTER — Encounter: Payer: Self-pay | Admitting: Cardiology

## 2016-07-18 ENCOUNTER — Telehealth: Payer: Self-pay | Admitting: *Deleted

## 2016-07-18 VITALS — BP 125/54 | HR 82 | Ht 65.0 in | Wt 149.0 lb

## 2016-07-18 DIAGNOSIS — I4891 Unspecified atrial fibrillation: Secondary | ICD-10-CM

## 2016-07-18 DIAGNOSIS — I5032 Chronic diastolic (congestive) heart failure: Secondary | ICD-10-CM | POA: Diagnosis not present

## 2016-07-18 DIAGNOSIS — I739 Peripheral vascular disease, unspecified: Secondary | ICD-10-CM

## 2016-07-18 DIAGNOSIS — I10 Essential (primary) hypertension: Secondary | ICD-10-CM | POA: Diagnosis not present

## 2016-07-18 DIAGNOSIS — N289 Disorder of kidney and ureter, unspecified: Secondary | ICD-10-CM

## 2016-07-18 LAB — BASIC METABOLIC PANEL
BUN: 44 mg/dL — ABNORMAL HIGH (ref 7–25)
CHLORIDE: 96 mmol/L — AB (ref 98–110)
CO2: 26 mmol/L (ref 20–31)
Calcium: 9.5 mg/dL (ref 8.6–10.3)
Creat: 1.66 mg/dL — ABNORMAL HIGH (ref 0.70–1.11)
Glucose, Bld: 98 mg/dL (ref 65–99)
POTASSIUM: 4.1 mmol/L (ref 3.5–5.3)
SODIUM: 135 mmol/L (ref 135–146)

## 2016-07-18 LAB — BRAIN NATRIURETIC PEPTIDE: BRAIN NATRIURETIC PEPTIDE: 70.1 pg/mL (ref <100)

## 2016-07-18 MED ORDER — FUROSEMIDE 20 MG PO TABS: 20.0000 mg | ORAL_TABLET | Freq: Every day | ORAL | 3 refills | Status: DC

## 2016-07-18 NOTE — Patient Instructions (Signed)
Medication Instructions:  Your physician recommends that you continue on your current medications as directed. Please refer to the Current Medication list given to you today.   Labwork: Your physician recommends that you return for lab work in: TODAY (Bmet/BNP) The lab can be found on the FIRST FLOOR of out building in [address]   Testing/Procedures: none  Follow-Up: You have been referred to Dr. Fletcher Anon - Peripheral Vascular Disease  Your physician recommends that you schedule a follow-up appointment in: 4 months with [name]   Any Other Special Instructions Will Be Listed Below (If Applicable).     If you need a refill on your cardiac medications before your next appointment, please call your pharmacy.

## 2016-07-18 NOTE — Telephone Encounter (Signed)
-----   Message from Lelon Perla, MD sent at 07/18/2016  4:00 PM EDT ----- GFR 28; change lasix to 20 mg daily; bmet one week; if GFR not better at that time will need to change xarelto to apixaban. Kirk Ruths

## 2016-07-18 NOTE — Telephone Encounter (Signed)
Spoke with pt, Aware of dr Jacalyn Lefevre recommendations. Patient voiced understanding of medication change and repeated them.

## 2016-07-27 LAB — BASIC METABOLIC PANEL
BUN: 39 mg/dL — ABNORMAL HIGH (ref 7–25)
CALCIUM: 9.5 mg/dL (ref 8.6–10.3)
CO2: 24 mmol/L (ref 20–31)
CREATININE: 1.89 mg/dL — AB (ref 0.70–1.11)
Chloride: 100 mmol/L (ref 98–110)
Glucose, Bld: 144 mg/dL — ABNORMAL HIGH (ref 65–99)
Potassium: 4.5 mmol/L (ref 3.5–5.3)
SODIUM: 136 mmol/L (ref 135–146)

## 2016-07-29 ENCOUNTER — Telehealth: Payer: Self-pay | Admitting: *Deleted

## 2016-07-29 DIAGNOSIS — I4891 Unspecified atrial fibrillation: Secondary | ICD-10-CM

## 2016-07-29 DIAGNOSIS — N289 Disorder of kidney and ureter, unspecified: Secondary | ICD-10-CM

## 2016-07-29 MED ORDER — FUROSEMIDE 20 MG PO TABS: 20.0000 mg | ORAL_TABLET | ORAL | 3 refills | Status: DC

## 2016-07-29 NOTE — Telephone Encounter (Signed)
-----   Message from Lelon Perla, MD sent at 07/27/2016  2:42 PM EDT ----- Change lasix to 20 qod, bmet one week Kirk Ruths  ----- Message ----- From: Cristopher Estimable, RN Sent: 07/27/2016  10:35 AM To: Lelon Perla, MD  Lasix was decreased to 20 mg once daily 07-18-16 ----- Message ----- From: Lelon Perla, MD Sent: 07/27/2016   7:29 AM To: Cristopher Estimable, RN  Change lasix to 20 mg daily; bmet one week Kirk Ruths

## 2016-07-29 NOTE — Telephone Encounter (Signed)
Spoke with pt, aware of medication changes. Lab orders mailed to the pt

## 2016-08-04 LAB — BASIC METABOLIC PANEL
BUN: 26 mg/dL — AB (ref 7–25)
CHLORIDE: 103 mmol/L (ref 98–110)
CO2: 27 mmol/L (ref 20–31)
Calcium: 9.2 mg/dL (ref 8.6–10.3)
Creat: 1.53 mg/dL — ABNORMAL HIGH (ref 0.70–1.11)
Glucose, Bld: 129 mg/dL — ABNORMAL HIGH (ref 65–99)
POTASSIUM: 4.3 mmol/L (ref 3.5–5.3)
SODIUM: 137 mmol/L (ref 135–146)

## 2016-08-16 ENCOUNTER — Ambulatory Visit (INDEPENDENT_AMBULATORY_CARE_PROVIDER_SITE_OTHER): Payer: Medicare Other | Admitting: Cardiovascular Disease

## 2016-08-16 VITALS — BP 150/65 | HR 71 | Ht 65.0 in | Wt 149.2 lb

## 2016-08-16 DIAGNOSIS — I5032 Chronic diastolic (congestive) heart failure: Secondary | ICD-10-CM | POA: Diagnosis not present

## 2016-08-16 DIAGNOSIS — I481 Persistent atrial fibrillation: Secondary | ICD-10-CM

## 2016-08-16 DIAGNOSIS — I739 Peripheral vascular disease, unspecified: Secondary | ICD-10-CM | POA: Diagnosis not present

## 2016-08-16 DIAGNOSIS — I4819 Other persistent atrial fibrillation: Secondary | ICD-10-CM

## 2016-08-16 MED ORDER — FUROSEMIDE 20 MG PO TABS: 20.0000 mg | ORAL_TABLET | ORAL | 3 refills | Status: DC

## 2016-08-16 NOTE — Patient Instructions (Signed)
Medication Instructions:  Your physician recommends that you continue on your current medications as directed. Please refer to the Current Medication list given to you today.  Labwork: none  Testing/Procedures: none  Follow-Up: Your physician recommends that you schedule a follow-up appointment in: 3 month ov  If you need a refill on your cardiac medications before your next appointment, please call your pharmacy.  

## 2016-08-16 NOTE — Progress Notes (Signed)
Cardiology Office Note   Date:  08/16/2016   ID:  Andre [name], DOB October 23, 1923, MRN [id number]  PCP:  Garret Reddish, MD  Cardiologist:  [name]  Chief Complaint  Patient presents with  . PAD    blockage in legs      History of Present Illness: Andre [name] is a 82 y.o. male who Was referred by [name] for evaluation and management of peripheral arterial disease and claudication. He has known history of persistent atrial fibrillation status post successful cardioversion in June 2017. He has other medical problems include chronic diastolic heart failure, chronic kidney disease and previous tobacco use. He is not diabetic. He started having increased shortness of breath in April and was diagnosed with atrial fibrillation. His symptoms improved after cardioversion. Around the same time, he noticed bilateral leg pain and weakness which starts in the thigh area. The discomfort is slightly worse on the right side than the left side. He underwent venous Doppler in November 2016 which showed no evidence of DVT. At that time, there was concerns about right leg swelling. He underwent abdominal aortic ultrasound for a bruit which showed no evidence of aneurysm. There was significant bilateral common iliac artery disease slightly worse on the right side. The patient continues to be active and exercises in a regular basis including 10 minutes on an exercise bike. He reports discomfort in legs after he walks about one quarter for mild but typically he does not have to stop.    Past Medical History:  Diagnosis Date  . Adenomatous colon polyp 04/1985   no further colonoscopy, 2008-last colonoscopy, no polyps    . Atrial fibrillation ([place])   . Diverticulosis   . History of skin cancer    dermatology every 6 months Dr. [name]  . Hyperlipidemia   . Hypertension   . Macular degeneration    bilateral  . Nephrolithiasis   . PAT (paroxysmal atrial tachycardia) (HCC)    many  years ago, worse with smoking    Past Surgical History:  Procedure Laterality Date  . CARDIOVERSION N/A 05/23/2016   Procedure: CARDIOVERSION;  Surgeon: [name], MD;  Location: [facility];  Service: Cardiovascular;  Laterality: N/A;  . CATARACT EXTRACTION     bilateral  . INGUINAL HERNIA REPAIR    . TEE WITHOUT CARDIOVERSION N/A 03/22/2016   Procedure: TRANSESOPHAGEAL ECHOCARDIOGRAM (TEE);  Surgeon: [name], MD;  Location: [place];  Service: Cardiovascular;  Laterality: N/A;  . TEE WITHOUT CARDIOVERSION N/A 05/23/2016   Procedure: TRANSESOPHAGEAL ECHOCARDIOGRAM (TEE);  Surgeon: [name], MD;  Location: [facility] ENDOSCOPY;  Service: Cardiovascular;  Laterality: N/A;  . TENDON REPAIR  12/11   right leg     Current Outpatient Prescriptions  Medication Sig Dispense Refill  . amLODipine (NORVASC) 10 MG tablet Take 1 tablet (10 mg total) by mouth daily. 90 tablet 3  . Calcium Carbonate-Vit D-Min (CALTRATE 600+D PLUS) 600-400 MG-UNIT per tablet Take 1 tablet by mouth daily.      . furosemide (LASIX) 20 MG tablet Take 1 tablet (20 mg total) by mouth every other day. 180 tablet 3  . losartan-hydrochlorothiazide (HYZAAR) 100-25 MG tablet Take 1 tablet by mouth daily. 90 tablet 3  . lovastatin (MEVACOR) 40 MG tablet Take 40 mg by mouth daily.    . Multiple Vitamin (MULTIVITAMIN WITH MINERALS) TABS tablet Take 1 tablet by mouth daily. ABC Plus Senior    . potassium chloride (K-DUR) 10 MEQ tablet Take 10 mEq  by mouth daily.    . Rivaroxaban (XARELTO) 15 MG TABS tablet Take 1 tablet (15 mg total) by mouth daily with supper. 90 tablet 3   No current facility-administered medications for this visit.     Allergies:   Review of patient's allergies indicates no known allergies.    Social History:  The patient  reports that he quit smoking about 12 years ago. His smoking use included Cigarettes. He has a 60.00 pack-year smoking history. He has never used smokeless tobacco. He  reports that he drinks alcohol. He reports that he does not use drugs.   Family History:  The patient's family history includes Cancer in his father; Hypertension in his mother.    ROS:  Please see the history of present illness.   Otherwise, review of systems are positive for none.   All other systems are reviewed and negative.    PHYSICAL EXAM: VS:  BP (!) 150/65   Pulse 71   Ht 5\' 5"  (1.651 m)   Wt 149 lb 3.2 oz (67.7 kg)   BMI 24.83 kg/m  , BMI Body mass index is 24.83 kg/m. GEN: Well nourished, well developed, in no acute distress  HEENT: normal  Neck: no JVD, carotid bruits, or masses Cardiac: RRR; no  rubs, or gallops,no edema . There is one out of 6 systolic ejection murmur in the aortic area.  Respiratory:  clear to auscultation bilaterally, normal work of breathing GI: soft, nontender, nondistended, + BS MS: no deformity or atrophy  Skin: warm and dry, no rash Neuro:  Strength and sensation are intact Psych: euthymic mood, full affect Vascular: Femoral pulses +1 on the right side and +2 on the left side. Distal pulses are +1.  EKG:  EKG is not ordered today.    Recent Labs: 10/30/2015: ALT 19; Pro B Natriuretic peptide (BNP) 1,564.00 03/21/2016: Platelets 371; TSH 0.97 05/23/2016: Hemoglobin 13.3 07/18/2016: Brain Natriuretic Peptide 70.1 08/03/2016: BUN 26; Creat 1.53; Potassium 4.3; Sodium 137    Lipid Panel    Component Value Date/Time   CHOL 172 02/17/2015 0902   TRIG 145.0 02/17/2015 0902   TRIG 166 (H) 11/29/2006 1220   HDL 57.30 02/17/2015 0902   CHOLHDL 3 02/17/2015 0902   VLDL 29.0 02/17/2015 0902   LDLCALC 86 02/17/2015 0902      Wt Readings from Last 3 Encounters:  08/16/16 149 lb 3.2 oz (67.7 kg)  07/18/16 149 lb (67.6 kg)  05/23/16 148 lb (67.1 kg)         ASSESSMENT AND PLAN:  1.  Peripheral arterial disease: The patient has overall moderate claudication due to bilateral iliac disease. I discussed with him the natural history and  management of peripheral arterial disease. He is not diabetic and is not a smoker. Thus, the chance of disease progression is low overall. I explained to him that the purpose of revascularization is symptom improvement. Currently, his claudication does not seem to be lifestyle limiting and he continues to be able to exercise. I advised him to start a walking exercise program daily and we will reevaluate his symptoms and 3 months. Given that he is on anticoagulation, I do not recommend cilostazol.  2. Atrial fibrillation: Currently in sinus rhythm after successful cardioversion. He is tolerating anticoagulation.  3. Chronic diastolic heart failure: Appears to be euvolemic.  Disposition:   FU with me in 3 months  Signed,  [name], MD  08/16/2016 10:13 AM    [place]

## 2016-08-25 ENCOUNTER — Ambulatory Visit (INDEPENDENT_AMBULATORY_CARE_PROVIDER_SITE_OTHER): Payer: Medicare Other | Admitting: Family Medicine

## 2016-08-25 ENCOUNTER — Encounter: Payer: Self-pay | Admitting: Family Medicine

## 2016-08-25 VITALS — BP 132/60 | HR 84 | Temp 97.8°F | Wt 146.6 lb

## 2016-08-25 DIAGNOSIS — I480 Paroxysmal atrial fibrillation: Secondary | ICD-10-CM

## 2016-08-25 DIAGNOSIS — I5032 Chronic diastolic (congestive) heart failure: Secondary | ICD-10-CM

## 2016-08-25 DIAGNOSIS — N183 Chronic kidney disease, stage 3 unspecified: Secondary | ICD-10-CM

## 2016-08-25 DIAGNOSIS — Z23 Encounter for immunization: Secondary | ICD-10-CM | POA: Diagnosis not present

## 2016-08-25 DIAGNOSIS — I739 Peripheral vascular disease, unspecified: Secondary | ICD-10-CM

## 2016-08-25 DIAGNOSIS — I10 Essential (primary) hypertension: Secondary | ICD-10-CM

## 2016-08-25 MED ORDER — LOSARTAN POTASSIUM-HCTZ 100-25 MG PO TABS: 1.0000 | ORAL_TABLET | Freq: Every day | ORAL | 3 refills | Status: DC

## 2016-08-25 MED ORDER — RIVAROXABAN 15 MG PO TABS: 15.0000 mg | ORAL_TABLET | Freq: Every day | ORAL | 3 refills | Status: DC

## 2016-08-25 MED ORDER — LOVASTATIN 40 MG PO TABS: 40.0000 mg | ORAL_TABLET | Freq: Every day | ORAL | 3 refills | Status: DC

## 2016-08-25 MED ORDER — AMLODIPINE BESYLATE 10 MG PO TABS: 10.0000 mg | ORAL_TABLET | Freq: Every day | ORAL | 3 refills | Status: DC

## 2016-08-25 NOTE — Progress Notes (Signed)
Subjective:  Andre [name] is a 82 y.o. year old very pleasant male patient who presents for/with See problem oriented charting ROS- No chest pain or shortness of breath. No headache or blurry vision. Occasional feels like heart is beatin gslightly harder but is regular in rate at time. .see any ROS included in HPI as well.   Past Medical History-  Patient Active Problem List   Diagnosis Date Noted  . Peripheral vascular disease ([place]) 04/21/2016    Priority: High  . Atrial fibrillation ([place]) 03/17/2016    Priority: High  . Diastolic CHF, chronic ([place]) 01/05/2016    Priority: High  . CKD (chronic kidney disease), stage III 02/17/2015    Priority: Medium  . Malignant neoplasm of prostate ([place]) 01/05/2010    Priority: Medium  . Hyperlipidemia 12/04/2007    Priority: Medium  . Essential hypertension 12/04/2007    Priority: Medium  . Macular degeneration 02/17/2015    Priority: Low  . Former smoker 02/17/2015    Priority: Low  . History of skin cancer     Priority: Low  . Angiodysplasia of intestine with hemorrhage 05/15/2007    Priority: Low    Medications- reviewed and updated Current Outpatient Prescriptions  Medication Sig Dispense Refill  . amLODipine (NORVASC) 10 MG tablet Take 1 tablet (10 mg total) by mouth daily. 92 tablet 3  . furosemide (LASIX) 20 MG tablet Take 1 tablet (20 mg total) by mouth every other day. 45 tablet 3  . losartan-hydrochlorothiazide (HYZAAR) 100-25 MG tablet Take 1 tablet by mouth daily. 92 tablet 3  . lovastatin (MEVACOR) 40 MG tablet Take 1 tablet (40 mg total) by mouth daily. 92 tablet 3  . Multiple Vitamin (MULTIVITAMIN WITH MINERALS) TABS tablet Take 1 tablet by mouth daily. ABC Plus Senior    . potassium chloride (K-DUR) 10 MEQ tablet Take 10 mEq by mouth daily.    . Rivaroxaban (XARELTO) 15 MG TABS tablet Take 1 tablet (15 mg total) by mouth daily with supper. 92 tablet 3   No current facility-administered medications for this visit.      Objective: BP 132/60   Pulse 84   Temp 97.8 F (36.6 C) (Oral)   Wt 146 lb 9.6 oz (66.5 kg)   SpO2 97%   BMI 24.40 kg/m  Gen: NAD, resting comfortably CV: RRR no murmurs rubs or gallops Lungs: CTAB no crackles, wheeze, rhonchi Abdomen: soft/nontender/nondistended/normal bowel sounds. No rebound or guarding.  Ext: no edema Skin: warm, dry Neuro: grossly normal, moves all extremities  Assessment/Plan:  Atrial fibrillation (HCC) S: patient did ultimately have cardioversion after resolution of thrombus and has maintained sinus rhythm. He remains on xarelto for anticoagulation- does not have medication for rate control at this point if goes into a fib again From 04/21/16 note by [name] "Patient remains in atrial fibrillation. Transesophageal echocardiogram in early April showed thrombus. Continue xarelto. Plan to repeat TEE with possible cardioversion if thrombus has resolved in June. Hopefully this will help CHF symptoms. His rate is controlled on the medications." A/P: continue anticoagulation and regular cardiology follow up.    Diastolic CHF, chronic (HCC) S:EF [id number] suspect diastolic. Moderate tricuspid regurgitation. A fib likely triggered exacerbation in 2017. Now down from alsix 40mg  BID to once every other day. Weight largely stable- no SOB or edema A/P: continue current medications- doing very well   Essential hypertension S: controlled on Amlodipine 10mg , losartan 100mg - HCTZ 25mg  BP Readings from Last 3 Encounters:  08/25/16 132/60  08/16/16 [place])  150/65  07/18/16 (!) 125/54  A/P:Continue current meds:  As long as not in a fib which seems to trigger CHF- reasonable to keep him on amlodipine as long as well controlled.    CKD (chronic kidney disease), stage III GFR 50-60. Lower dose of xarelto due to this. Had to have lasix reduced as a result.    Return in about 1 year (around 08/25/2017) for annual wellness visit. Discussed more than happy to see him at 6  months or prn as needed as well  Orders Placed This Encounter  Procedures  . Flu vaccine HIGH DOSE PF    Meds ordered this encounter  Medications  . amLODipine (NORVASC) 10 MG tablet    Sig: Take 1 tablet (10 mg total) by mouth daily.    Dispense:  92 tablet    Refill:  3  . losartan-hydrochlorothiazide (HYZAAR) 100-25 MG tablet    Sig: Take 1 tablet by mouth daily.    Dispense:  92 tablet    Refill:  3  . lovastatin (MEVACOR) 40 MG tablet    Sig: Take 1 tablet (40 mg total) by mouth daily.    Dispense:  92 tablet    Refill:  3  . Rivaroxaban (XARELTO) 15 MG TABS tablet    Sig: Take 1 tablet (15 mg total) by mouth daily with supper.    Dispense:  92 tablet    Refill:  3    Return precautions advised.  Garret Reddish, MD

## 2016-08-25 NOTE — Assessment & Plan Note (Signed)
GFR 50-60. Lower dose of xarelto due to this. Had to have lasix reduced as a result.

## 2016-08-25 NOTE — Patient Instructions (Addendum)
High dose flu shot today  No other changes  Refilled meds for a year- happy to see you sooner, but definitely see me in a year

## 2016-08-25 NOTE — Assessment & Plan Note (Signed)
S: controlled on Amlodipine 10mg , losartan 100mg - HCTZ 25mg  BP Readings from Last 3 Encounters:  08/25/16 132/60  08/16/16 (!) 150/65  07/18/16 (!) 125/54  A/P:Continue current meds:  As long as not in a fib which seems to trigger CHF- reasonable to keep him on amlodipine as long as well controlled.

## 2016-08-25 NOTE — Progress Notes (Signed)
Pre visit review using our clinic review tool, if applicable. No additional management support is needed unless otherwise documented below in the visit note. 

## 2016-08-25 NOTE — Assessment & Plan Note (Signed)
S: patient did ultimately have cardioversion after resolution of thrombus and has maintained sinus rhythm. He remains on xarelto for anticoagulation- does not have medication for rate control at this point if goes into a fib again From 04/21/16 note by [name] "Patient remains in atrial fibrillation. Transesophageal echocardiogram in early April showed thrombus. Continue xarelto. Plan to repeat TEE with possible cardioversion if thrombus has resolved in June. Hopefully this will help CHF symptoms. His rate is controlled on the medications." A/P: continue anticoagulation and regular cardiology follow up.

## 2016-08-25 NOTE — Assessment & Plan Note (Signed)
S:EF Q000111Q suspect diastolic. Moderate tricuspid regurgitation. A fib likely triggered exacerbation in 2017. Now down from alsix 40mg  BID to once every other day. Weight largely stable- no SOB or edema A/P: continue current medications- doing very well

## 2016-09-12 ENCOUNTER — Other Ambulatory Visit: Payer: Self-pay | Admitting: Family Medicine

## 2016-11-14 NOTE — Progress Notes (Signed)
HPI: FU atrial fibrillation and diastolic congestive heart failure. Echocardiogram December 2016 showed vigorous LV function. There was mild left atrial enlargement and mild mitral regurgitation. Moderate tricuspid regurgitation with moderately elevated pulmonary pressures. Patient scheduled for TEE guided cardioversion April 2017. This showed normal LV function, mild mitral regurgitation, left atrial appendage thrombus. Cardioversion canceled. Abdominal ultrasound April 2017 showed no aneurysm. There was greater than 50% bilateral common iliac stenosis and greater than 50% right external iliac stenosis. TEE repeated June 2017 and showed normal LV systolic function, mild mitral regurgitation, mild to moderate left atrial enlargement but no thrombus, mild to moderate tricuspid regurgitation. Patient subsequently had successful cardioversion. Seen by Dr Fletcher Anon for claudication and medical therapy recommended. Since last seen,   Current Outpatient Prescriptions  Medication Sig Dispense Refill  . amLODipine (NORVASC) 10 MG tablet Take 1 tablet (10 mg total) by mouth daily. 92 tablet 3  . furosemide (LASIX) 20 MG tablet Take 1 tablet (20 mg total) by mouth every other day. 45 tablet 3  . losartan-hydrochlorothiazide (HYZAAR) 100-25 MG tablet Take 1 tablet by mouth daily. 92 tablet 3  . lovastatin (MEVACOR) 40 MG tablet Take 1 tablet (40 mg total) by mouth daily. 92 tablet 3  . Multiple Vitamin (MULTIVITAMIN WITH MINERALS) TABS tablet Take 1 tablet by mouth daily. ABC Plus Senior    . potassium chloride (K-DUR) 10 MEQ tablet Take 10 mEq by mouth daily.    . Rivaroxaban (XARELTO) 15 MG TABS tablet Take 1 tablet (15 mg total) by mouth daily with supper. 92 tablet 3   No current facility-administered medications for this visit.      Past Medical History:  Diagnosis Date  . Adenomatous colon polyp 04/1985   no further colonoscopy, 2008-last colonoscopy, no polyps    . Atrial fibrillation ([place])   .  Diverticulosis   . History of skin cancer    dermatology every 6 months Dr. [name]  . Hyperlipidemia   . Hypertension   . Macular degeneration    bilateral  . Nephrolithiasis   . PAT (paroxysmal atrial tachycardia) (HCC)    many years ago, worse with smoking    Past Surgical History:  Procedure Laterality Date  . CARDIOVERSION N/A 05/23/2016   Procedure: CARDIOVERSION;  Surgeon: [name], MD;  [facility];  Service: Cardiovascular;  Laterality: N/A;  . CATARACT EXTRACTION     bilateral  . INGUINAL HERNIA REPAIR    . TEE WITHOUT CARDIOVERSION N/A 03/22/2016   Procedure: TRANSESOPHAGEAL ECHOCARDIOGRAM (TEE);  Surgeon: [name], MD;  Location: [place];  Service: Cardiovascular;  Laterality: N/A;  . TEE WITHOUT CARDIOVERSION N/A 05/23/2016   Procedure: TRANSESOPHAGEAL ECHOCARDIOGRAM (TEE);  Surgeon: [name], MD;  Location: [facility] ENDOSCOPY;  Service: Cardiovascular;  Laterality: N/A;  . TENDON REPAIR  12/11   right leg    Social History   Social History  . Marital status: Married    Spouse name: N/A  . Number of children: 5  . Years of education: N/A   Occupational History  . RETIRED Retired   Social History Main Topics  . Smoking status: Former Smoker    Packs/day: 1.00    Years: 60.00    Types: Cigarettes    Quit date: 12/13/2003  . Smokeless tobacco: Never Used  . Alcohol use 0.0 oz/week     Comment: 2 glass of wine a day  . Drug use: No  . Sexual activity: Not on file   Other  Topics Concern  . Not on file   Social History Narrative   Married (64 years in 08/2015-goes to outside practice). 5 children. 8 grandchildren (lost 1 grandchild #9 to [place] 18,  Lost #9 to seizures)      Retired at [employer], high Cabin crew      Hobbies: time with family, go to beach (51 years in a row), travel    Family History  Problem Relation Age of Onset  . Hypertension Mother   . Cancer Father     lung    ROS: no fevers or  chills, productive cough, hemoptysis, dysphasia, odynophagia, melena, hematochezia, dysuria, hematuria, rash, seizure activity, orthopnea, PND, pedal edema, claudication. Remaining systems are negative.  Physical Exam: Well-developed well-nourished in no acute distress.  Skin is warm and dry.  HEENT is normal.  Neck is supple.  Chest is clear to auscultation with normal expansion.  Cardiovascular exam is regular rate and rhythm.  Abdominal exam nontender or distended. No masses palpated. Extremities show no edema. neuro grossly intact  A/P  1 paroxysmal atrial fibrillation-patient remains in sinus rhythm. Continue xarelto. Check Hgb and renal function.  2 vascular disease-continue statin. No aspirin given need for anticoagulation. Followed by Dr Fletcher Anon.  3 chronic diastolic congestive heart failure-patient is much improved. Much of his heart failure previously was related to atrial fibrillation. I will change his Lasix and potassium to daily only as needed.  4 hypertension-blood pressure controlled. Continue present medications.  5 hyperlipidemia-continue statin.  Kirk Ruths, MD

## 2016-11-15 ENCOUNTER — Encounter: Payer: Self-pay | Admitting: Cardiovascular Disease

## 2016-11-15 ENCOUNTER — Ambulatory Visit (INDEPENDENT_AMBULATORY_CARE_PROVIDER_SITE_OTHER): Payer: Medicare Other | Admitting: Cardiovascular Disease

## 2016-11-15 VITALS — BP 150/48 | HR 76 | Ht 65.75 in | Wt 147.8 lb

## 2016-11-15 DIAGNOSIS — I739 Peripheral vascular disease, unspecified: Secondary | ICD-10-CM

## 2016-11-15 DIAGNOSIS — I4819 Other persistent atrial fibrillation: Secondary | ICD-10-CM

## 2016-11-15 DIAGNOSIS — I481 Persistent atrial fibrillation: Secondary | ICD-10-CM | POA: Diagnosis not present

## 2016-11-15 NOTE — Patient Instructions (Signed)

## 2016-11-15 NOTE — Progress Notes (Signed)
Cardiology Office Note   Date:  11/15/2016   ID:  Andre [name], DOB October 28, 1923, MRN [id number]  PCP:  Garret Reddish, MD  Cardiologist:  [name]  No chief complaint on file.     History of Present Illness: Andre [name] is a 82 y.o. male who Is here today for a follow-up visit regarding peripheral arterial disease. He has known history of persistent atrial fibrillation status post successful cardioversion in June 2017. He has other medical problems include chronic diastolic heart failure, chronic kidney disease and previous tobacco use.  He was seen a few months ago for bilateral leg claudication.  Aortoiliac duplex showed  significant bilateral common iliac artery disease slightly worse on the right side. His symptoms were not lifestyle limiting and I advised him to start a daily walking program. He has been walking daily for 30 minutes. Typically has to rest twice for a few minutes but he is able to walk about 1 and 1/3 of a mile .    Past Medical History:  Diagnosis Date  . Adenomatous colon polyp 04/1985   no further colonoscopy, 2008-last colonoscopy, no polyps    . Atrial fibrillation ([place])   . Diverticulosis   . History of skin cancer    dermatology every 6 months Dr. [name]  . Hyperlipidemia   . Hypertension   . Macular degeneration    bilateral  . Nephrolithiasis   . PAT (paroxysmal atrial tachycardia) (HCC)    many years ago, worse with smoking    Past Surgical History:  Procedure Laterality Date  . CARDIOVERSION N/A 05/23/2016   Procedure: CARDIOVERSION;  Surgeon: [name], MD;  [facility];  Service: Cardiovascular;  Laterality: N/A;  . CATARACT EXTRACTION     bilateral  . INGUINAL HERNIA REPAIR    . TEE WITHOUT CARDIOVERSION N/A 03/22/2016   Procedure: TRANSESOPHAGEAL ECHOCARDIOGRAM (TEE);  Surgeon: [name], MD;  Location: [place];  Service: Cardiovascular;  Laterality: N/A;  . TEE WITHOUT CARDIOVERSION  N/A 05/23/2016   Procedure: TRANSESOPHAGEAL ECHOCARDIOGRAM (TEE);  Surgeon: [name], MD;  Location: [facility] ENDOSCOPY;  Service: Cardiovascular;  Laterality: N/A;  . TENDON REPAIR  12/11   right leg     Current Outpatient Prescriptions  Medication Sig Dispense Refill  . amLODipine (NORVASC) 10 MG tablet Take 1 tablet (10 mg total) by mouth daily. 92 tablet 3  . furosemide (LASIX) 20 MG tablet Take 1 tablet (20 mg total) by mouth every other day. 45 tablet 3  . losartan-hydrochlorothiazide (HYZAAR) 100-25 MG tablet Take 1 tablet by mouth daily. 92 tablet 3  . lovastatin (MEVACOR) 40 MG tablet Take 1 tablet (40 mg total) by mouth daily. 92 tablet 3  . Multiple Vitamin (MULTIVITAMIN WITH MINERALS) TABS tablet Take 1 tablet by mouth daily. ABC Plus Senior    . potassium chloride (K-DUR) 10 MEQ tablet Take 10 mEq by mouth daily.    . Rivaroxaban (XARELTO) 15 MG TABS tablet Take 1 tablet (15 mg total) by mouth daily with supper. 92 tablet 3   No current facility-administered medications for this visit.     Allergies:   Patient has no known allergies.    Social History:  The patient  reports that he quit smoking about 12 years ago. His smoking use included Cigarettes. He has a 60.00 pack-year smoking history. He has never used smokeless tobacco. He reports that he drinks alcohol. He reports that he does not use drugs.   Family History:  The patient's family history includes Cancer in his father; Hypertension in his mother.    ROS:  Please see the history of present illness.   Otherwise, review of systems are positive for none.   All other systems are reviewed and negative.    PHYSICAL EXAM: VS:  BP (!) 150/48   Pulse 76   Ht 5' 5.75" (1.67 m)   Wt 147 lb 12.8 oz (67 kg)   BMI 24.04 kg/m  , BMI Body mass index is 24.04 kg/m. GEN: Well nourished, well developed, in no acute distress  HEENT: normal  Neck: no JVD, carotid bruits, or masses Cardiac: RRR; no  rubs, or gallops,no edema .  There is one out of 6 systolic ejection murmur in the aortic area.  Respiratory:  clear to auscultation bilaterally, normal work of breathing GI: soft, nontender, nondistended, + BS MS: no deformity or atrophy  Skin: warm and dry, no rash Neuro:  Strength and sensation are intact Psych: euthymic mood, full affect Vascular: Femoral pulses +1 on the right side and +2 on the left side. Distal pulses are +1.  EKG:  EKG is not ordered today.    Recent Labs: 03/21/2016: Platelets 371; TSH 0.97 05/23/2016: Hemoglobin 13.3 07/18/2016: Brain Natriuretic Peptide 70.1 08/03/2016: BUN 26; Creat 1.53; Potassium 4.3; Sodium 137    Lipid Panel    Component Value Date/Time   CHOL 172 02/17/2015 0902   TRIG 145.0 02/17/2015 0902   TRIG 166 (H) 11/29/2006 1220   HDL 57.30 02/17/2015 0902   CHOLHDL 3 02/17/2015 0902   VLDL 29.0 02/17/2015 0902   LDLCALC 86 02/17/2015 0902      Wt Readings from Last 3 Encounters:  11/15/16 147 lb 12.8 oz (67 kg)  08/25/16 146 lb 9.6 oz (66.5 kg)  08/16/16 149 lb 3.2 oz (67.7 kg)         ASSESSMENT AND PLAN:  1.  Peripheral arterial disease: The patient has overall moderate claudication due to bilateral iliac disease. The patient has been walking daily for exercise with some improvement in claudication. His symptoms are currently not lifestyle limiting and thus I recommend continuing medical therapy.  2. Atrial fibrillation:He is maintaining in sinus rhythm. He is tolerating anticoagulation.  3. Chronic diastolic heart failure: Appears to be euvolemic.  Disposition:   FU with me in 6 months  Signed,  [name], MD  11/15/2016 10:38 AM    [place]

## 2016-11-17 ENCOUNTER — Ambulatory Visit (INDEPENDENT_AMBULATORY_CARE_PROVIDER_SITE_OTHER): Payer: Medicare Other | Admitting: Cardiology

## 2016-11-17 ENCOUNTER — Encounter: Payer: Self-pay | Admitting: Cardiology

## 2016-11-17 VITALS — BP 140/52 | HR 73 | Ht 65.0 in | Wt 147.0 lb

## 2016-11-17 DIAGNOSIS — I4891 Unspecified atrial fibrillation: Secondary | ICD-10-CM

## 2016-11-17 DIAGNOSIS — I10 Essential (primary) hypertension: Secondary | ICD-10-CM

## 2016-11-17 DIAGNOSIS — I5032 Chronic diastolic (congestive) heart failure: Secondary | ICD-10-CM

## 2016-11-17 DIAGNOSIS — I739 Peripheral vascular disease, unspecified: Secondary | ICD-10-CM

## 2016-11-17 MED ORDER — FUROSEMIDE 20 MG PO TABS: 20.0000 mg | ORAL_TABLET | Freq: Every day | ORAL | 3 refills | Status: DC | PRN

## 2016-11-17 MED ORDER — POTASSIUM CHLORIDE ER 10 MEQ PO TBCR: 10.0000 meq | EXTENDED_RELEASE_TABLET | Freq: Every day | ORAL | Status: DC | PRN

## 2016-11-17 NOTE — Patient Instructions (Signed)
Medication Instructions:   TAKE FUROSEMIDE AND POTASSIUM DAILY AS NEEDED  Labwork:  Your physician recommends that you return for lab work in: [place]:  Your physician wants you to follow-up in: [place] will receive a reminder letter in the mail two months in advance. If you don't receive a letter, please call our office to schedule the follow-up appointment.   If you need a refill on your cardiac medications before your next appointment, please call your pharmacy.

## 2016-11-25 LAB — BASIC METABOLIC PANEL
BUN: 27 mg/dL — ABNORMAL HIGH (ref 7–25)
CALCIUM: 9 mg/dL (ref 8.6–10.3)
CO2: 29 mmol/L (ref 20–31)
Chloride: 101 mmol/L (ref 98–110)
Creat: 1.47 mg/dL — ABNORMAL HIGH (ref 0.70–1.11)
Glucose, Bld: 125 mg/dL — ABNORMAL HIGH (ref 65–99)
Potassium: 4.2 mmol/L (ref 3.5–5.3)
SODIUM: 136 mmol/L (ref 135–146)

## 2016-11-25 LAB — CBC
HCT: 38.5 % (ref 38.5–50.0)
HEMOGLOBIN: 12.6 g/dL — AB (ref 13.2–17.1)
MCH: 31 pg (ref 27.0–33.0)
MCHC: 32.7 g/dL (ref 32.0–36.0)
MCV: 94.8 fL (ref 80.0–100.0)
MPV: 10.5 fL (ref 7.5–12.5)
Platelets: 279 10*3/uL (ref 140–400)
RBC: 4.06 MIL/uL — AB (ref 4.20–5.80)
RDW: 13.8 % (ref 11.0–15.0)
WBC: 7.2 10*3/uL (ref 3.8–10.8)

## 2016-12-27 ENCOUNTER — Telehealth: Payer: Self-pay | Admitting: Family Medicine

## 2016-12-27 ENCOUNTER — Other Ambulatory Visit: Payer: Self-pay

## 2016-12-27 MED ORDER — AMLODIPINE BESYLATE 10 MG PO TABS
10.0000 mg | ORAL_TABLET | Freq: Every day | ORAL | 3 refills | Status: DC
Start: 2016-12-27 — End: 2017-10-30

## 2016-12-27 NOTE — Telephone Encounter (Signed)
Pt need new Rx amlodipine  New pharmacy:  [facility]  [phone]

## 2016-12-27 NOTE — Telephone Encounter (Signed)
Prescription sent in as requested 

## 2017-02-02 ENCOUNTER — Telehealth: Payer: Self-pay | Admitting: Family Medicine

## 2017-02-02 NOTE — Telephone Encounter (Signed)
Pt request refill   lovastatin (MEVACOR) 40 MG tablet losartan-hydrochlorothiazide (HYZAAR) 100-25 MG tablet Rivaroxaban (XARELTO) 15 MG TABS tablet  [facility] [place] [place] AT [facility] [facility]  90 day each

## 2017-02-03 ENCOUNTER — Other Ambulatory Visit: Payer: Self-pay

## 2017-02-03 MED ORDER — RIVAROXABAN 15 MG PO TABS: 15.0000 mg | ORAL_TABLET | Freq: Every day | ORAL | 3 refills | Status: DC

## 2017-02-03 MED ORDER — LOSARTAN POTASSIUM-HCTZ 100-25 MG PO TABS: 1.0000 | ORAL_TABLET | Freq: Every day | ORAL | 3 refills | Status: DC

## 2017-02-03 MED ORDER — LOVASTATIN 40 MG PO TABS: 40.0000 mg | ORAL_TABLET | Freq: Every day | ORAL | 3 refills | Status: DC

## 2017-02-03 NOTE — Telephone Encounter (Signed)
Prescriptions sent to pharmacy as requested 

## 2017-04-11 ENCOUNTER — Telehealth: Payer: Self-pay

## 2017-04-11 NOTE — Telephone Encounter (Signed)
Call to discuss AWV apt. LVM for call back

## 2017-04-12 ENCOUNTER — Other Ambulatory Visit: Payer: Self-pay | Admitting: Ophthalmology

## 2017-05-16 ENCOUNTER — Encounter: Payer: Self-pay | Admitting: Cardiovascular Disease

## 2017-05-16 ENCOUNTER — Ambulatory Visit (INDEPENDENT_AMBULATORY_CARE_PROVIDER_SITE_OTHER): Payer: Medicare Other | Admitting: Cardiovascular Disease

## 2017-05-16 VITALS — BP 158/62 | HR 74 | Ht 65.0 in | Wt 152.0 lb

## 2017-05-16 DIAGNOSIS — I739 Peripheral vascular disease, unspecified: Secondary | ICD-10-CM | POA: Diagnosis not present

## 2017-05-16 NOTE — Patient Instructions (Signed)

## 2017-05-16 NOTE — Progress Notes (Signed)
Cardiology Office Note   Date:  05/16/2017   ID:  Andre [name], DOB Dec 24, 1922, MRN [id number]  PCP:  [name], MD  Cardiologist:  [name]  Chief Complaint  Patient presents with  . Follow-up    pt has no complaints today      History of Present Illness: Andre [name] is a 83 y.o. male who Is here today for a follow-up visit regarding peripheral arterial disease. He has known history of persistent atrial fibrillation status post successful cardioversion in June 2017. He has other medical problems include chronic diastolic heart failure, chronic kidney disease and previous tobacco use.  He was seen last year for bilateral leg claudication.  Aortoiliac duplex showed  significant bilateral common iliac artery disease slightly worse on the right side. His symptoms were not lifestyle limiting and I advised him to start a daily walking program.   He continues to walk daily for 30 minutes. He is able to walk about 1 and 1/3 of a mile . He usually stops one time for a few minutes.    Past Medical History:  Diagnosis Date  . Adenomatous colon polyp 04/1985   no further colonoscopy, 2008-last colonoscopy, no polyps    . Atrial fibrillation ([place])   . Diverticulosis   . History of skin cancer    dermatology every 6 months Dr. [name]  . Hyperlipidemia   . Hypertension   . Macular degeneration    bilateral  . Nephrolithiasis   . PAT (paroxysmal atrial tachycardia) (HCC)    many years ago, worse with smoking    Past Surgical History:  Procedure Laterality Date  . CARDIOVERSION N/A 05/23/2016   Procedure: CARDIOVERSION;  Surgeon: [name], MD;  [facility]: [facility];  Service: Cardiovascular;  Laterality: N/A;  . CATARACT EXTRACTION     bilateral  . INGUINAL HERNIA REPAIR    . TEE WITHOUT CARDIOVERSION N/A 03/22/2016   Procedure: TRANSESOPHAGEAL ECHOCARDIOGRAM (TEE);  Surgeon: [name], MD;  Location: [place];  Service:  Cardiovascular;  Laterality: N/A;  . TEE WITHOUT CARDIOVERSION N/A 05/23/2016   Procedure: TRANSESOPHAGEAL ECHOCARDIOGRAM (TEE);  Surgeon: [name], MD;  Location: [facility] ENDOSCOPY;  Service: Cardiovascular;  Laterality: N/A;  . TENDON REPAIR  12/11   right leg     Current Outpatient Prescriptions  Medication Sig Dispense Refill  . amLODipine (NORVASC) 10 MG tablet Take 1 tablet (10 mg total) by mouth daily. 92 tablet 3  . losartan-hydrochlorothiazide (HYZAAR) 100-25 MG tablet Take 1 tablet by mouth daily. 92 tablet 3  . lovastatin (MEVACOR) 40 MG tablet Take 1 tablet (40 mg total) by mouth daily. 92 tablet 3  . Multiple Vitamin (MULTIVITAMIN WITH MINERALS) TABS tablet Take 1 tablet by mouth daily. ABC Plus Senior    . Rivaroxaban (XARELTO) 15 MG TABS tablet Take 1 tablet (15 mg total) by mouth daily with supper. 92 tablet 3   No current facility-administered medications for this visit.     Allergies:   Patient has no known allergies.    Social History:  The patient  reports that he quit smoking about 13 years ago. His smoking use included Cigarettes. He has a 60.00 pack-year smoking history. He has never used smokeless tobacco. He reports that he drinks alcohol. He reports that he does not use drugs.   Family History:  The patient's family history includes Cancer in his father; Hypertension in his mother.    ROS:  Please see the history of  present illness.   Otherwise, review of systems are positive for none.   All other systems are reviewed and negative.    PHYSICAL EXAM: VS:  BP (!) 158/62   Pulse 74   Ht 5\' 5"  (1.651 m)   Wt 152 lb (68.9 kg)   BMI 25.29 kg/m  , BMI Body mass index is 25.29 kg/m. GEN: Well nourished, well developed, in no acute distress  HEENT: normal  Neck: no JVD, or masses. Bilateral carotid bruits louder on the right side Cardiac: RRR; no  rubs, or gallops,no edema . There is 2/6 systolic ejection murmur in the aortic area.  Respiratory:  clear to  auscultation bilaterally, normal work of breathing GI: soft, nontender, nondistended, + BS MS: no deformity or atrophy  Skin: warm and dry, no rash Neuro:  Strength and sensation are intact Psych: euthymic mood, full affect Vascular: Femoral pulses +1 on the right side and +2 on the left side. Distal pulses are +1.  EKG:  EKG is not ordered today.    Recent Labs: 07/18/2016: Brain Natriuretic Peptide 70.1 11/24/2016: BUN 27; Creat 1.47; Hemoglobin 12.6; Platelets 279; Potassium 4.2; Sodium 136    Lipid Panel    Component Value Date/Time   CHOL 172 02/17/2015 0902   TRIG 145.0 02/17/2015 0902   TRIG 166 (H) 11/29/2006 1220   HDL 57.30 02/17/2015 0902   CHOLHDL 3 02/17/2015 0902   VLDL 29.0 02/17/2015 0902   LDLCALC 86 02/17/2015 0902      Wt Readings from Last 3 Encounters:  05/16/17 152 lb (68.9 kg)  11/17/16 147 lb (66.7 kg)  11/15/16 147 lb 12.8 oz (67 kg)         ASSESSMENT AND PLAN:  1.  Peripheral arterial disease: The patient Continues to have moderate claudication due to bilateral iliac disease.  The patient is somewhat frustrated with his symptoms but I still feel that his symptoms are not lifestyle limiting. Considering his age and underlying chronic kidney disease, I recommend continuing medical therapy and reserving revascularization for refractory symptoms.  2. Atrial fibrillation:He is maintaining in sinus rhythm. He is tolerating anticoagulation.  3. Chronic diastolic heart failure: Appears to be euvolemic.  Disposition:   FU with me in 12 months  Signed,  [name], MD  05/16/2017 10:23 AM    [place]

## 2017-06-07 NOTE — Progress Notes (Signed)
HPI: FU atrial fibrillation and diastolic congestive heart failure. Echocardiogram December 2016 showed vigorous LV function. There was mild left atrial enlargement and mild mitral regurgitation. Moderate tricuspid regurgitation with moderately elevated pulmonary pressures. Patient scheduled for TEE guided cardioversion April 2017. This showed normal LV function, mild mitral regurgitation, left atrial appendage thrombus. Cardioversion canceled. Abdominal ultrasound April 2017 showed no aneurysm. There was greater than 50% bilateral common iliac stenosis and greater than 50% right external iliac stenosis. TEE repeated June 2017 and showed normal LV systolic function, mild mitral regurgitation, mild to moderate left atrial enlargement but no thrombus, mild to moderate tricuspid regurgitation. Patient subsequently had successful cardioversion. Seen by Dr Fletcher Anon for claudication and medical therapy recommended. Since last seen, he denies dyspnea, chest pain, palpitations or syncope. Mild pedal edema. No bleeding.  Current Outpatient Prescriptions  Medication Sig Dispense Refill  . amLODipine (NORVASC) 10 MG tablet Take 1 tablet (10 mg total) by mouth daily. 92 tablet 3  . losartan-hydrochlorothiazide (HYZAAR) 100-25 MG tablet Take 1 tablet by mouth daily. 92 tablet 3  . lovastatin (MEVACOR) 40 MG tablet Take 1 tablet (40 mg total) by mouth daily. 92 tablet 3  . Multiple Vitamin (MULTIVITAMIN WITH MINERALS) TABS tablet Take 1 tablet by mouth daily. ABC Plus Senior    . Rivaroxaban (XARELTO) 15 MG TABS tablet Take 1 tablet (15 mg total) by mouth daily with supper. 92 tablet 3   No current facility-administered medications for this visit.      Past Medical History:  Diagnosis Date  . Adenomatous colon polyp 04/1985   no further colonoscopy, 2008-last colonoscopy, no polyps    . Atrial fibrillation ([place])   . Diverticulosis   . History of skin cancer    dermatology every 6 months Dr. [name]    . Hyperlipidemia   . Hypertension   . Macular degeneration    bilateral  . Nephrolithiasis   . PAT (paroxysmal atrial tachycardia) (HCC)    many years ago, worse with smoking    Past Surgical History:  Procedure Laterality Date  . CARDIOVERSION N/A 05/23/2016   Procedure: CARDIOVERSION;  Surgeon: [name], MD;  [facility]: [facility];  Service: Cardiovascular;  Laterality: N/A;  . CATARACT EXTRACTION     bilateral  . INGUINAL HERNIA REPAIR    . TEE WITHOUT CARDIOVERSION N/A 03/22/2016   Procedure: TRANSESOPHAGEAL ECHOCARDIOGRAM (TEE);  Surgeon: [name], MD;  Location: [place];  Service: Cardiovascular;  Laterality: N/A;  . TEE WITHOUT CARDIOVERSION N/A 05/23/2016   Procedure: TRANSESOPHAGEAL ECHOCARDIOGRAM (TEE);  Surgeon: [name], MD;  Location: [facility] ENDOSCOPY;  Service: Cardiovascular;  Laterality: N/A;  . TENDON REPAIR  12/11   right leg    Social History   Social History  . Marital status: Married    Spouse name: N/A  . Number of children: 5  . Years of education: N/A   Occupational History  . RETIRED Retired   Social History Main Topics  . Smoking status: Former Smoker    Packs/day: 1.00    Years: 60.00    Types: Cigarettes    Quit date: 12/13/2003  . Smokeless tobacco: Never Used  . Alcohol use 0.0 oz/week     Comment: 2 glass of wine a day  . Drug use: No  . Sexual activity: Not on file   Other Topics Concern  . Not on file   Social History Narrative   Married (64 years in 08/2015-goes to outside practice). 5 children.  8 grandchildren (lost 1 grandchild #9 to [place] 18,  Lost #9 to seizures)      Retired at [employer], high Cabin crew      Hobbies: time with family, go to beach (51 years in a row), travel    Family History  Problem Relation Age of Onset  . Hypertension Mother   . Cancer Father        lung    ROS: Some leg pain with ambulation but no fevers or chills, productive cough, hemoptysis, dysphasia,  odynophagia, melena, hematochezia, dysuria, hematuria, rash, seizure activity, orthopnea, PND, claudication. Remaining systems are negative.  Physical Exam: Well-developed well-nourished in no acute distress.  Skin is warm and dry.  HEENT is normal.  Neck is supple.  Chest is clear to auscultation with normal expansion.  Cardiovascular exam is regular rate and rhythm.  Abdominal exam nontender or distended. No masses palpated. Extremities show 1+ ankle edema on right. neuro grossly intact  ECG- sinus rhythm at a rate of 68. First-degree AV block. Left anterior fascicular block. Right bundle branch block. Left ventricular hypertrophy. personally reviewed  A/P  1 paroxysmal atrial fibrillation-patient remains in sinus rhythm today. Continue xarelto. Check Hgb and renal function.  2 peripheral vascular disease-medical therapy per Dr. Fletcher Anon. Continue statin. No aspirin given need for anticoagulation.  3 hypertension-blood pressure is controlled. Continue present medications.  4 hyperlipidemia-continue statin.  5 chronic diastolic congestive heart failure-his symptoms are much improved in sinus rhythm. Continue Lasix as needed. I discussed the importance of low sodium diet and fluid restriction.  Kirk Ruths, MD

## 2017-06-21 ENCOUNTER — Encounter: Payer: Self-pay | Admitting: Cardiology

## 2017-06-21 ENCOUNTER — Ambulatory Visit (INDEPENDENT_AMBULATORY_CARE_PROVIDER_SITE_OTHER): Payer: Medicare Other | Admitting: Cardiology

## 2017-06-21 VITALS — BP 140/62 | HR 68 | Ht 65.0 in | Wt 153.0 lb

## 2017-06-21 DIAGNOSIS — E7800 Pure hypercholesterolemia, unspecified: Secondary | ICD-10-CM

## 2017-06-21 DIAGNOSIS — I10 Essential (primary) hypertension: Secondary | ICD-10-CM

## 2017-06-21 DIAGNOSIS — I480 Paroxysmal atrial fibrillation: Secondary | ICD-10-CM | POA: Diagnosis not present

## 2017-06-21 DIAGNOSIS — I38 Endocarditis, valve unspecified: Secondary | ICD-10-CM

## 2017-06-21 DIAGNOSIS — I5032 Chronic diastolic (congestive) heart failure: Secondary | ICD-10-CM

## 2017-06-21 NOTE — Patient Instructions (Signed)
Medication Instructions:   NO CHANGE  Labwork:  Your physician recommends that you return for lab work WHEN FASTING  Follow-Up:  Your physician wants you to follow-up in: 6 MONTHS WITH DR CRENSHAW You will receive a reminder letter in the mail two months in advance. If you don't receive a letter, please call our office to schedule the follow-up appointment.   If you need a refill on your cardiac medications before your next appointment, please call your pharmacy.  

## 2017-06-23 LAB — BASIC METABOLIC PANEL
BUN/Creatinine Ratio: 18 (ref 10–24)
BUN: 27 mg/dL (ref 10–36)
CO2: 23 mmol/L (ref 20–29)
Calcium: 9.6 mg/dL (ref 8.6–10.2)
Chloride: 98 mmol/L (ref 96–106)
Creatinine, Ser: 1.48 mg/dL — ABNORMAL HIGH (ref 0.76–1.27)
GFR calc Af Amer: 47 mL/min/{1.73_m2} — ABNORMAL LOW (ref >59)
GFR calc non Af Amer: 41 mL/min/{1.73_m2} — ABNORMAL LOW (ref >59)
GLUCOSE: 97 mg/dL (ref 65–99)
POTASSIUM: 4.4 mmol/L (ref 3.5–5.2)
SODIUM: 138 mmol/L (ref 134–144)

## 2017-06-23 LAB — CBC
Hematocrit: 40.4 % (ref 37.5–51.0)
Hemoglobin: 13.9 g/dL (ref 13.0–17.7)
MCH: 32.3 pg (ref 26.6–33.0)
MCHC: 34.4 g/dL (ref 31.5–35.7)
MCV: 94 fL (ref 79–97)
Platelets: 349 10*3/uL (ref 150–379)
RBC: 4.31 x10E6/uL (ref 4.14–5.80)
RDW: 13.7 % (ref 12.3–15.4)
WBC: 9.3 10*3/uL (ref 3.4–10.8)

## 2017-06-23 LAB — HEPATIC FUNCTION PANEL
ALBUMIN: 4 g/dL (ref 3.2–4.6)
ALT: 16 IU/L (ref 0–44)
AST: 17 IU/L (ref 0–40)
Alkaline Phosphatase: 58 IU/L (ref 39–117)
BILIRUBIN TOTAL: 0.4 mg/dL (ref 0.0–1.2)
BILIRUBIN, DIRECT: 0.13 mg/dL (ref 0.00–0.40)
Total Protein: 7.3 g/dL (ref 6.0–8.5)

## 2017-06-23 LAB — LIPID PANEL
CHOL/HDL RATIO: 2.2 ratio (ref 0.0–5.0)
CHOLESTEROL TOTAL: 163 mg/dL (ref 100–199)
HDL: 73 mg/dL (ref >39)
LDL CALC: 78 mg/dL (ref 0–99)
TRIGLYCERIDES: 60 mg/dL (ref 0–149)
VLDL Cholesterol Cal: 12 mg/dL (ref 5–40)

## 2017-06-26 ENCOUNTER — Encounter: Payer: Self-pay | Admitting: *Deleted

## 2017-07-07 ENCOUNTER — Other Ambulatory Visit: Payer: Self-pay

## 2017-08-25 ENCOUNTER — Encounter: Payer: Medicare Other | Admitting: Family Medicine

## 2017-08-28 ENCOUNTER — Ambulatory Visit (INDEPENDENT_AMBULATORY_CARE_PROVIDER_SITE_OTHER): Payer: Medicare Other | Admitting: Family Medicine

## 2017-08-28 ENCOUNTER — Encounter: Payer: Self-pay | Admitting: Family Medicine

## 2017-08-28 VITALS — BP 138/60 | HR 78 | Temp 97.9°F | Ht 65.25 in | Wt 151.8 lb

## 2017-08-28 DIAGNOSIS — I480 Paroxysmal atrial fibrillation: Secondary | ICD-10-CM | POA: Diagnosis not present

## 2017-08-28 DIAGNOSIS — Z23 Encounter for immunization: Secondary | ICD-10-CM

## 2017-08-28 DIAGNOSIS — C61 Malignant neoplasm of prostate: Secondary | ICD-10-CM

## 2017-08-28 DIAGNOSIS — E785 Hyperlipidemia, unspecified: Secondary | ICD-10-CM

## 2017-08-28 DIAGNOSIS — I5032 Chronic diastolic (congestive) heart failure: Secondary | ICD-10-CM

## 2017-08-28 DIAGNOSIS — N183 Chronic kidney disease, stage 3 unspecified: Secondary | ICD-10-CM

## 2017-08-28 DIAGNOSIS — I739 Peripheral vascular disease, unspecified: Secondary | ICD-10-CM

## 2017-08-28 DIAGNOSIS — I10 Essential (primary) hypertension: Secondary | ICD-10-CM

## 2017-08-28 NOTE — Assessment & Plan Note (Signed)
S: lasix prn only- last year was on every other day at 40mg - now hasnt taken since June. Weight stable. No increased edema. Has discussed low sodium diet and fluid restriction with cardiology. Also has moderate tricuspid regurgitation. .  A/P: continue prn lasix only

## 2017-08-28 NOTE — Assessment & Plan Note (Signed)
S: controlled poorly initially  on losartan- hctz 100-25, amlodipine 10mg .  BP Readings from Last 3 Encounters:  08/28/17 138/60  06/21/17 140/62  05/16/17 (!) 158/62  A/P: We discussed blood pressure goal of <140/90. Continue current meds:  Controlled on repeat testing today though high normal

## 2017-08-28 NOTE — Patient Instructions (Addendum)
High dose flu shot today  Call us when you have about a month left of lovastatin and I will change to atorvastatin 20mg 

## 2017-08-28 NOTE — Assessment & Plan Note (Signed)
S: PAD- on medical therapy with Dr. Fletcher Anon- yearly visits. On statin. On xarelto so being kept off aspirin. Has been walking 30 minutes a day- slowed down because wife broke her hip A/P: encouraged walking in the home even if he cannot get out of the house. Increase statin to get LDL <70

## 2017-08-28 NOTE — Progress Notes (Signed)
Subjective:  Andre [name] is a 83 y.o. year old very pleasant male patient who presents for/with See problem oriented charting ROS- mild edema right leg stable, no chest pain. No headaches. No increased shortness of breath, walking less due to some stress caring for wife and doesn't want to leave her alone   Past Medical History-  Patient Active Problem List   Diagnosis Date Noted  . Peripheral vascular disease ([place]) 04/21/2016    Priority: High  . Atrial fibrillation ([place]) 03/17/2016    Priority: High  . Diastolic CHF, chronic ([place]) 01/05/2016    Priority: High  . CKD (chronic kidney disease), stage III 02/17/2015    Priority: Medium  . Malignant neoplasm of prostate ([place]) 01/05/2010    Priority: Medium  . Hyperlipidemia 12/04/2007    Priority: Medium  . Essential hypertension 12/04/2007    Priority: Medium  . Macular degeneration 02/17/2015    Priority: Low  . Former smoker 02/17/2015    Priority: Low  . History of skin cancer     Priority: Low  . Angiodysplasia of intestine with hemorrhage 05/15/2007    Priority: Low    Medications- reviewed and updated Current Outpatient Prescriptions  Medication Sig Dispense Refill  . amLODipine (NORVASC) 10 MG tablet Take 1 tablet (10 mg total) by mouth daily. 92 tablet 3  . losartan-hydrochlorothiazide (HYZAAR) 100-25 MG tablet Take 1 tablet by mouth daily. 92 tablet 3  . lovastatin (MEVACOR) 40 MG tablet Take 1 tablet (40 mg total) by mouth daily. 92 tablet 3  . Multiple Vitamin (MULTIVITAMIN WITH MINERALS) TABS tablet Take 1 tablet by mouth daily. ABC Plus Senior    . Rivaroxaban (XARELTO) 15 MG TABS tablet Take 1 tablet (15 mg total) by mouth daily with supper. 92 tablet 3   No current facility-administered medications for this visit.     Objective: BP 138/60   Pulse 78   Temp 97.9 F (36.6 C) (Oral)   Ht 5' 5.25" (1.657 m)   Wt 151 lb 12.8 oz (68.9 kg)   SpO2 97%   BMI 25.07 kg/m  Gen: NAD, resting  comfortably CV: RRR no murmurs rubs or gallops Lungs: CTAB no crackles, wheeze, rhonchi Abdomen: soft/nontender/nondistended/normal bowel sounds. No rebound or guarding.  Ext: no edema Skin: warm, dry, a few mildly erythematous patches on right inner upper arm Neuro: normal gait  Assessment/Plan:   Had some hives last week it sounds like- red itchy bumps that resolved within a few days. He wonders about shingles but was on right arm, both sides of back. Encouraged him to monitor for recurrence- unclear cuase today but doubt shingles with dibstribution  Seeing Dr. Ronnald Ramp every 6 months- 2 upcoming Mohs- [facility]  Malignant neoplasm of prostate Seeing every 6 months. PSA monitored by Dr. Alinda Money. States exam was low risk. Last biopsy a few years ago  Atrial fibrillation ([place]) S:  patient with a fib history- paroxysmal. On xarelto. No beta blocker.  A/P: appears to be in sinus rhythm- may have been in this for a over a year at this point  Peripheral vascular disease ([place]) S: PAD- on medical therapy with Dr. Fletcher Anon- yearly visits. On statin. On xarelto so being kept off aspirin. Has been walking 30 minutes a day- slowed down because wife broke her hip A/P: encouraged walking in the home even if he cannot get out of the house. Increase statin to get LDL <70  Essential hypertension S: controlled poorly initially  on losartan- hctz  100-25, amlodipine 10mg .  BP Readings from Last 3 Encounters:  08/28/17 138/60  06/21/17 140/62  05/16/17 (!) 158/62  A/P: We discussed blood pressure goal of <140/90. Continue current meds:  Controlled on repeat testing today though high normal  Hyperlipidemia S: reasonably controlled on lovastatin 40mg - with PAD history discussed LDL goal <70. No myalgias.  Lab Results  Component Value Date   CHOL 163 06/23/2017   HDL 73 06/23/2017   LDLCALC 78 06/23/2017   TRIG 60 06/23/2017   CHOLHDL 2.2 06/23/2017   A/P:  agreed to change to atorvastatin  20mg  once he gets down to a few weeks of pillso of lovastatin- target ldl <03  Diastolic CHF, chronic (HCC) S: lasix prn only- last year was on every other day at 40mg - now hasnt taken since June. Weight stable. No increased edema. Has discussed low sodium diet and fluid restriction with cardiology. Also has moderate tricuspid regurgitation. .  A/P: continue prn lasix only  CKD (chronic kidney disease), stage III S: GFR in 50-60 range previously. Most recent check shows GFR at 41.  A/P: can take xarelto 15mg  down to crcl of 15 so ok for now- continue risk factor modification. Monitor at least yearly- he prefers to come in only once yearly- if dips into 30s may need to test this more regularly- being off lasix may help    Return in about 1 year (around 08/28/2018) for follow up- or sooner if needed. No CPE as medicare only  Orders Placed This Encounter  Procedures  . Flu vaccine HIGH DOSE PF   Return precautions advised.  Garret Reddish, MD

## 2017-08-28 NOTE — Assessment & Plan Note (Signed)
Seeing every 6 months. PSA monitored by Dr. Alinda Money. States exam was low risk. Last biopsy a few years ago

## 2017-08-28 NOTE — Assessment & Plan Note (Signed)
S: GFR in 50-60 range previously. Most recent check shows GFR at 41.  A/P: can take xarelto 15mg  down to crcl of 15 so ok for now- continue risk factor modification. Monitor at least yearly- he prefers to come in only once yearly- if dips into 30s may need to test this more regularly- being off lasix may help

## 2017-08-28 NOTE — Assessment & Plan Note (Signed)
S: reasonably controlled on lovastatin 40mg - with PAD history discussed LDL goal <70. No myalgias.  Lab Results  Component Value Date   CHOL 163 06/23/2017   HDL 73 06/23/2017   LDLCALC 78 06/23/2017   TRIG 60 06/23/2017   CHOLHDL 2.2 06/23/2017   A/P:  agreed to change to atorvastatin 20mg  once he gets down to a few weeks of pillso of lovastatin- target ldl <70

## 2017-08-28 NOTE — Assessment & Plan Note (Signed)
S:  patient with a fib history- paroxysmal. On xarelto. No beta blocker.  A/P: appears to be in sinus rhythm- may have been in this for a over a year at this point

## 2017-10-18 NOTE — Telephone Encounter (Signed)
done

## 2017-10-30 ENCOUNTER — Other Ambulatory Visit: Payer: Self-pay | Admitting: Family Medicine

## 2017-12-13 ENCOUNTER — Other Ambulatory Visit: Payer: Self-pay | Admitting: Family Medicine

## 2017-12-13 MED ORDER — AMLODIPINE BESYLATE 10 MG PO TABS: 10.0000 mg | ORAL_TABLET | Freq: Every day | ORAL | 2 refills | Status: DC

## 2017-12-13 MED ORDER — LOVASTATIN 40 MG PO TABS: 40.0000 mg | ORAL_TABLET | Freq: Every day | ORAL | 2 refills | Status: DC

## 2017-12-13 MED ORDER — LOSARTAN POTASSIUM-HCTZ 100-25 MG PO TABS: 1.0000 | ORAL_TABLET | Freq: Every day | ORAL | 2 refills | Status: DC

## 2017-12-13 MED ORDER — RIVAROXABAN 15 MG PO TABS: 15.0000 mg | ORAL_TABLET | Freq: Every day | ORAL | 2 refills | Status: DC

## 2017-12-13 NOTE — Telephone Encounter (Signed)
Copied from [place]. Topic: Quick Communication - Rx Refill/Question >> Dec 13, 2017 10:29 AM Arletha Grippe wrote: Has the patient contacted their pharmacy? No. [facility]   (Agent: If no, request that the patient contact the pharmacy for the refill.)   Preferred Pharmacy (with phone number or street name): [facility] - pt needs refills on  amLODipine (NORVASC) 10 MG tablet losartan-hydrochlorothiazide (HYZAAR) 100-25 MG tablet lovastatin (MEVACOR) 40 MG tablet Rivaroxaban (XARELTO) 15 MG TABS tablet Pt is asking for 90 day supply and 3 refills.    Agent: Please be advised that RX refills may take up to 3 business days. We ask that you follow-up with your pharmacy.

## 2017-12-28 ENCOUNTER — Ambulatory Visit (INDEPENDENT_AMBULATORY_CARE_PROVIDER_SITE_OTHER): Payer: Medicare Other | Admitting: Family Medicine

## 2017-12-28 DIAGNOSIS — H6121 Impacted cerumen, right ear: Secondary | ICD-10-CM | POA: Diagnosis not present

## 2017-12-28 DIAGNOSIS — H6123 Impacted cerumen, bilateral: Secondary | ICD-10-CM | POA: Diagnosis not present

## 2017-12-28 NOTE — Patient Instructions (Signed)
Earwax Buildup, Adult The ears produce a substance called earwax that helps keep bacteria out of the ear and protects the skin in the ear canal. Occasionally, earwax can build up in the ear and cause discomfort or hearing loss. What increases the risk? This condition is more likely to develop in people who:  Are male.  Are elderly.  Naturally produce more earwax.  Clean their ears often with cotton swabs.  Use earplugs often.  Use in-ear headphones often.  Wear hearing aids.  Have narrow ear canals.  Have earwax that is overly thick or sticky.  Have eczema.  Are dehydrated.  Have excess hair in the ear canal.  What are the signs or symptoms? Symptoms of this condition include:  Reduced or muffled hearing.  A feeling of fullness in the ear or feeling that the ear is plugged.  Fluid coming from the ear.  Ear pain.  Ear itch.  Ringing in the ear.  Coughing.  An obvious piece of earwax that can be seen inside the ear canal.  How is this diagnosed? This condition may be diagnosed based on:  Your symptoms.  Your medical history.  An ear exam. During the exam, your health care provider will look into your ear with an instrument called an otoscope.  You may have tests, including a hearing test. How is this treated? This condition may be treated by:  Using ear drops to soften the earwax.  Having the earwax removed by a health care provider. The health care provider may: ? Flush the ear with water. ? Use an instrument that has a loop on the end (curette). ? Use a suction device.  Surgery to remove the wax buildup. This may be done in severe cases.  Follow these instructions at home:  Take over-the-counter and prescription medicines only as told by your health care provider.  Do not put any objects, including cotton swabs, into your ear. You can clean the opening of your ear canal with a washcloth or facial tissue.  Follow instructions from your health  care provider about cleaning your ears. Do not over-clean your ears.  Drink enough fluid to keep your urine clear or pale yellow. This will help to thin the earwax.  Keep all follow-up visits as told by your health care provider. If earwax builds up in your ears often or if you use hearing aids, consider seeing your health care provider for routine, preventive ear cleanings. Ask your health care provider how often you should schedule your cleanings.  If you have hearing aids, clean them according to instructions from the manufacturer and your health care provider. Contact a health care provider if:  You have ear pain.  You develop a fever.  You have blood, pus, or other fluid coming from your ear.  You have hearing loss.  You have ringing in your ears that does not go away.  Your symptoms do not improve with treatment.  You feel like the room is spinning (vertigo). Summary  Earwax can build up in the ear and cause discomfort or hearing loss.  The most common symptoms of this condition include reduced or muffled hearing and a feeling of fullness in the ear or feeling that the ear is plugged.  This condition may be diagnosed based on your symptoms, your medical history, and an ear exam.  This condition may be treated by using ear drops to soften the earwax or by having the earwax removed by a health care provider.  Do   not put any objects, including cotton swabs, into your ear. You can clean the opening of your ear canal with a washcloth or facial tissue. This information is not intended to replace advice given to you by your health care provider. Make sure you discuss any questions you have with your health care provider. Document Released: 01/05/2005 Document Revised: 02/08/2017 Document Reviewed: 02/08/2017 Elsevier Interactive Patient Education  2018 Elsevier Inc.  

## 2017-12-31 NOTE — Progress Notes (Signed)
Seen in nurse visit during wife's annual wellness visit. Noted that his Right>Left hearing had reduced and felt full. Cerumin bilaterally present and occluding canal on Rt. Removed by lavage by CMA.

## 2018-01-11 NOTE — Progress Notes (Signed)
HPI: FU atrial fibrillation and diastolic congestive heart failure. Echocardiogram December 2016 showed vigorous LV function. There was mild left atrial enlargement and mild mitral regurgitation. Moderate tricuspid regurgitation with moderately elevated pulmonary pressures. Patient scheduled for TEE guided cardioversion April 2017. This showed normal LV function, mild mitral regurgitation, left atrial appendage thrombus. Cardioversion canceled. Abdominal ultrasound April 2017 showed no aneurysm. There was greater than 50% bilateral common iliac stenosis and greater than 50% right external iliac stenosis. TEE repeated June 2017 and showed normal LV systolic function, mild mitral regurgitation, mild to moderate left atrial enlargement but no thrombus, mild to moderate tricuspid regurgitation. Patient subsequently had successful cardioversion. Seen by Dr Fletcher Anon for claudication and medical therapy recommended. Since last seen,  patient continues to have bilateral lower extremity claudication after ambulating 1/2 mile.  He must stop about three quarters of a mile and then continues.  There is no dyspnea on exertion, orthopnea, PND, chest pain or syncope.  Current Outpatient Medications  Medication Sig Dispense Refill  . amLODipine (NORVASC) 10 MG tablet Take 1 tablet (10 mg total) by mouth daily. 90 tablet 2  . losartan-hydrochlorothiazide (HYZAAR) 100-25 MG tablet Take 1 tablet by mouth daily. 90 tablet 2  . lovastatin (MEVACOR) 40 MG tablet Take 1 tablet (40 mg total) by mouth daily. 90 tablet 2  . Multiple Vitamin (MULTIVITAMIN WITH MINERALS) TABS tablet Take 1 tablet by mouth daily. ABC Plus Senior    . Rivaroxaban (XARELTO) 15 MG TABS tablet Take 1 tablet (15 mg total) by mouth daily with supper. 90 tablet 2   No current facility-administered medications for this visit.      Past Medical History:  Diagnosis Date  . Adenomatous colon polyp 04/1985   no further colonoscopy, 2008-last  colonoscopy, no polyps    . Atrial fibrillation ([place])   . Diverticulosis   . History of skin cancer    dermatology every 6 months Dr. [name]  . Hyperlipidemia   . Hypertension   . Macular degeneration    bilateral  . Nephrolithiasis   . PAT (paroxysmal atrial tachycardia) (HCC)    many years ago, worse with smoking    Past Surgical History:  Procedure Laterality Date  . CARDIOVERSION N/A 05/23/2016   Procedure: CARDIOVERSION;  Surgeon: [name], MD;  Location: [facility];  Service: Cardiovascular;  Laterality: N/A;  . CATARACT EXTRACTION     bilateral  . INGUINAL HERNIA REPAIR    . TEE WITHOUT CARDIOVERSION N/A 03/22/2016   Procedure: TRANSESOPHAGEAL ECHOCARDIOGRAM (TEE);  Surgeon: [name], MD;  Location: [facility];  Service: Cardiovascular;  Laterality: N/A;  . TEE WITHOUT CARDIOVERSION N/A 05/23/2016   Procedure: TRANSESOPHAGEAL ECHOCARDIOGRAM (TEE);  Surgeon: [name], MD;  Location: [facility] ENDOSCOPY;  Service: Cardiovascular;  Laterality: N/A;  . TENDON REPAIR  12/11   right leg    Social History   Socioeconomic History  . Marital status: Married    Spouse name: Not on file  . Number of children: 5  . Years of education: Not on file  . Highest education level: Not on file  Social Needs  . Financial resource strain: Not on file  . Food insecurity - worry: Not on file  . Food insecurity - inability: Not on file  . Transportation needs - medical: Not on file  . Transportation needs - non-medical: Not on file  Occupational History  . Occupation: RETIRED    Employer: RETIRED  Tobacco Use  . Smoking status:  Former Smoker    Packs/day: 1.00    Years: 60.00    Pack years: 60.00    Types: Cigarettes    Last attempt to quit: 12/13/2003    Years since quitting: 14.1  . Smokeless tobacco: Never Used  Substance and Sexual Activity  . Alcohol use: Yes    Alcohol/week: 0.0 oz    Comment: 2 glass of wine a day  . Drug use: No  . Sexual activity:  Not on file  Other Topics Concern  . Not on file  Social History Narrative   Married (64 years in 08/2015-goes to outside practice). 5 children. 8 grandchildren (lost 1 grandchild #9 to [place] 18,  Lost #9 to seizures)      Retired at [employer], high Cabin crew      Hobbies: time with family, go to beach (51 years in a row), travel    Family History  Problem Relation Age of Onset  . Hypertension Mother   . Cancer Father        lung    ROS: no fevers or chills, productive cough, hemoptysis, dysphasia, odynophagia, melena, hematochezia, dysuria, hematuria, rash, seizure activity, orthopnea, PND. Remaining systems are negative.  Physical Exam: Well-developed well-nourished in no acute distress.  Skin is warm and dry.  HEENT is normal.  Neck is supple.  Chest is clear to auscultation with normal expansion.  Cardiovascular exam is regular rate and rhythm.  Abdominal exam nontender or distended. No masses palpated. Extremities show  trace edema. neuro grossly intact  ECG- NSR, first degree AV block, LAFB, RBBB, LVH; personally reviewed  A/P  1 paroxysmal atrial fibrillation-patient in sinus rhythm today.  Continue Xarelto.  Check hemoglobin and renal function.  2 chronic diastolic congestive heart failure-patient had significant exacerbation of symptoms when he was in atrial fibrillation.  He remains in sinus rhythm.  Continue Lasix as needed.  We discussed the importance of fluid restriction and low-sodium diet.  3 hypertension-blood pressure is controlled.  Continue present medications.  4 hyperlipidemia-continue statin. Check lipids and liver.  5 peripheral vascular disease-continue statin.  No aspirin given need for Xarelto.  Medical therapy per Dr. Fletcher Anon. He is scheduled for FU in June.  Kirk Ruths, MD

## 2018-01-12 LAB — PSA: PSA: 10.2

## 2018-01-18 ENCOUNTER — Telehealth: Payer: Self-pay | Admitting: Cardiology

## 2018-01-18 NOTE — Telephone Encounter (Signed)
Will forward message to Dr. Lafayette Dragon nurse.

## 2018-01-18 NOTE — Telephone Encounter (Signed)
Patient calling,states that he will fast , because [name] usually has him fast for labs

## 2018-01-19 ENCOUNTER — Ambulatory Visit (INDEPENDENT_AMBULATORY_CARE_PROVIDER_SITE_OTHER): Payer: Medicare Other | Admitting: Cardiology

## 2018-01-19 ENCOUNTER — Encounter: Payer: Self-pay | Admitting: Cardiology

## 2018-01-19 VITALS — BP 142/62 | HR 72 | Ht 65.25 in | Wt 153.0 lb

## 2018-01-19 DIAGNOSIS — I739 Peripheral vascular disease, unspecified: Secondary | ICD-10-CM

## 2018-01-19 DIAGNOSIS — I38 Endocarditis, valve unspecified: Secondary | ICD-10-CM | POA: Diagnosis not present

## 2018-01-19 DIAGNOSIS — E7800 Pure hypercholesterolemia, unspecified: Secondary | ICD-10-CM

## 2018-01-19 DIAGNOSIS — I480 Paroxysmal atrial fibrillation: Secondary | ICD-10-CM

## 2018-01-19 DIAGNOSIS — I5032 Chronic diastolic (congestive) heart failure: Secondary | ICD-10-CM | POA: Diagnosis not present

## 2018-01-19 NOTE — Patient Instructions (Signed)

## 2018-01-20 LAB — COMPREHENSIVE METABOLIC PANEL
A/G RATIO: 1.3 (ref 1.2–2.2)
ALBUMIN: 4.2 g/dL (ref 3.2–4.6)
ALT: 13 IU/L (ref 0–44)
AST: 12 IU/L (ref 0–40)
Alkaline Phosphatase: 56 IU/L (ref 39–117)
BILIRUBIN TOTAL: 0.5 mg/dL (ref 0.0–1.2)
BUN / CREAT RATIO: 19 (ref 10–24)
BUN: 31 mg/dL (ref 10–36)
CALCIUM: 9.6 mg/dL (ref 8.6–10.2)
CHLORIDE: 99 mmol/L (ref 96–106)
CO2: 25 mmol/L (ref 20–29)
Creatinine, Ser: 1.6 mg/dL — ABNORMAL HIGH (ref 0.76–1.27)
GFR calc Af Amer: 43 mL/min/{1.73_m2} — ABNORMAL LOW (ref >59)
GFR, EST NON AFRICAN AMERICAN: 37 mL/min/{1.73_m2} — AB (ref >59)
GLOBULIN, TOTAL: 3.2 g/dL (ref 1.5–4.5)
Glucose: 107 mg/dL — ABNORMAL HIGH (ref 65–99)
POTASSIUM: 4.6 mmol/L (ref 3.5–5.2)
Sodium: 139 mmol/L (ref 134–144)
Total Protein: 7.4 g/dL (ref 6.0–8.5)

## 2018-01-20 LAB — CBC
HEMATOCRIT: 41.6 % (ref 37.5–51.0)
HEMOGLOBIN: 14.4 g/dL (ref 13.0–17.7)
MCH: 32.1 pg (ref 26.6–33.0)
MCHC: 34.6 g/dL (ref 31.5–35.7)
MCV: 93 fL (ref 79–97)
PLATELETS: 319 10*3/uL (ref 150–379)
RBC: 4.48 x10E6/uL (ref 4.14–5.80)
RDW: 13.2 % (ref 12.3–15.4)
WBC: 8.9 10*3/uL (ref 3.4–10.8)

## 2018-01-20 LAB — LIPID PANEL
CHOL/HDL RATIO: 2.7 ratio (ref 0.0–5.0)
Cholesterol, Total: 167 mg/dL (ref 100–199)
HDL: 63 mg/dL (ref >39)
LDL Calculated: 84 mg/dL (ref 0–99)
Triglycerides: 102 mg/dL (ref 0–149)
VLDL Cholesterol Cal: 20 mg/dL (ref 5–40)

## 2018-01-22 ENCOUNTER — Encounter: Payer: Self-pay | Admitting: *Deleted

## 2018-02-08 ENCOUNTER — Encounter: Payer: Self-pay | Admitting: Family Medicine

## 2018-06-12 ENCOUNTER — Encounter: Payer: Self-pay | Admitting: Cardiovascular Disease

## 2018-06-12 ENCOUNTER — Ambulatory Visit (INDEPENDENT_AMBULATORY_CARE_PROVIDER_SITE_OTHER): Payer: Medicare Other | Admitting: Cardiovascular Disease

## 2018-06-12 VITALS — BP 172/68 | HR 67 | Ht 65.75 in | Wt 157.6 lb

## 2018-06-12 DIAGNOSIS — I739 Peripheral vascular disease, unspecified: Secondary | ICD-10-CM | POA: Diagnosis not present

## 2018-06-12 DIAGNOSIS — I5032 Chronic diastolic (congestive) heart failure: Secondary | ICD-10-CM

## 2018-06-12 DIAGNOSIS — E785 Hyperlipidemia, unspecified: Secondary | ICD-10-CM

## 2018-06-12 DIAGNOSIS — I480 Paroxysmal atrial fibrillation: Secondary | ICD-10-CM

## 2018-06-12 NOTE — Progress Notes (Signed)
Cardiology Office Note   Date:  06/12/2018   ID:  ASIF MUCHOW, DOB 1923-06-21, MRN [id number]  PCP:  [name], MD  Cardiologist:  [name]  No chief complaint on file.     History of Present Illness: Andre [name] is a 84 y.o. male who Is here today for a follow-up visit regarding peripheral arterial disease. He has known history of persistent atrial fibrillation status post successful cardioversion in June 2017. He has other medical problems include chronic diastolic heart failure, chronic kidney disease and previous tobacco use.  He is followed  for bilateral leg claudication.  Aortoiliac duplex in 2017 showed  significant bilateral common iliac artery disease slightly worse on the right side. He reports worsening bilateral leg claudication slightly worse on the right side.  In spite of his age, he is very active and walks daily for exercise.  He feels that his leg claudication is gradually getting worse and he has to take frequent stops and rest before he can resume.  No rest pain or lower extremity ulceration.   Past Medical History:  Diagnosis Date  . Adenomatous colon polyp 04/1985   no further colonoscopy, 2008-last colonoscopy, no polyps    . Atrial fibrillation ([place])   . Diverticulosis   . History of skin cancer    dermatology every 6 months Dr. [name]  . Hyperlipidemia   . Hypertension   . Macular degeneration    bilateral  . Nephrolithiasis   . PAT (paroxysmal atrial tachycardia) (HCC)    many years ago, worse with smoking    Past Surgical History:  Procedure Laterality Date  . CARDIOVERSION N/A 05/23/2016   Procedure: CARDIOVERSION;  Surgeon: [name], MD;  Location: [facility];  Service: Cardiovascular;  Laterality: N/A;  . CATARACT EXTRACTION     bilateral  . INGUINAL HERNIA REPAIR    . TEE WITHOUT CARDIOVERSION N/A 03/22/2016   Procedure: TRANSESOPHAGEAL ECHOCARDIOGRAM (TEE);  Surgeon: [name], MD;  Location:  [facility];  Service: Cardiovascular;  Laterality: N/A;  . TEE WITHOUT CARDIOVERSION N/A 05/23/2016   Procedure: TRANSESOPHAGEAL ECHOCARDIOGRAM (TEE);  Surgeon: [name], MD;  Location: [facility] ENDOSCOPY;  Service: Cardiovascular;  Laterality: N/A;  . TENDON REPAIR  12/11   right leg     Current Outpatient Medications  Medication Sig Dispense Refill  . amLODipine (NORVASC) 10 MG tablet Take 1 tablet (10 mg total) by mouth daily. 90 tablet 2  . losartan-hydrochlorothiazide (HYZAAR) 100-25 MG tablet Take 1 tablet by mouth daily. 90 tablet 2  . lovastatin (MEVACOR) 40 MG tablet Take 1 tablet (40 mg total) by mouth daily. 90 tablet 2  . Multiple Vitamin (MULTIVITAMIN WITH MINERALS) TABS tablet Take 1 tablet by mouth daily. ABC Plus Senior    . Rivaroxaban (XARELTO) 15 MG TABS tablet Take 1 tablet (15 mg total) by mouth daily with supper. 90 tablet 2   No current facility-administered medications for this visit.     Allergies:   Patient has no known allergies.    Social History:  The patient  reports that he quit smoking about 14 years ago. His smoking use included cigarettes. He has a 60.00 pack-year smoking history. He has never used smokeless tobacco. He reports that he drinks alcohol. He reports that he does not use drugs.   Family History:  The patient's family history includes Cancer in his father; Hypertension in his mother.    ROS:  Please see the history of present illness.  Otherwise, review of systems are positive for none.   All other systems are reviewed and negative.    PHYSICAL EXAM: VS:  BP (!) 172/68   Pulse 67   Ht 5' 5.75" (1.67 m)   Wt 157 lb 9.6 oz (71.5 kg)   SpO2 94%   BMI 25.63 kg/m  , BMI Body mass index is 25.63 kg/m. GEN: Well nourished, well developed, in no acute distress  HEENT: normal  Neck: no JVD, or masses. Bilateral carotid bruits louder on the right side Cardiac: RRR; no  rubs, or gallops,no edema . There is 2/6 systolic ejection murmur in  the aortic area.  Respiratory:  clear to auscultation bilaterally, normal work of breathing GI: soft, nontender, nondistended, + BS MS: no deformity or atrophy  Skin: warm and dry, no rash Neuro:  Strength and sensation are intact Psych: euthymic mood, full affect Vascular: Femoral pulses +1 on the right side and +1 on the left side. Distal pulses are +1.  EKG:  EKG is not ordered today.    Recent Labs: 01/19/2018: ALT 13; BUN 31; Creatinine, Ser 1.60; Hemoglobin 14.4; Platelets 319; Potassium 4.6; Sodium 139    Lipid Panel    Component Value Date/Time   CHOL 167 01/19/2018 1010   TRIG 102 01/19/2018 1010   TRIG 166 (H) 11/29/2006 1220   HDL 63 01/19/2018 1010   CHOLHDL 2.7 01/19/2018 1010   CHOLHDL 3 02/17/2015 0902   VLDL 29.0 02/17/2015 0902   LDLCALC 84 01/19/2018 1010      Wt Readings from Last 3 Encounters:  06/12/18 157 lb 9.6 oz (71.5 kg)  01/19/18 153 lb (69.4 kg)  08/28/17 151 lb 12.8 oz (68.9 kg)         ASSESSMENT AND PLAN:  1.  Peripheral arterial disease: He reports progression of claudication which seems to be severe and lifestyle limiting at the present time.  In spite of his advanced age, he is very active and tries to walk regularly for exercise.  He has not been able to do as much as he is used to.  He reports that he played tennis until he was 84 years old.   I am going to obtain a follow-up aortoiliac duplex and ABI.  I discussed the option of proceeding with angiography with CO2 with possible endovascular intervention.  The patient wants to think about this.  I discussed the procedure in details as well as risks and benefits especially the risk of contrast-induced nephropathy.  I will see him again after his vascular testing.    2. Atrial fibrillation: Currently on anticoagulation with Xarelto.  3. Chronic diastolic heart failure: Appears to be euvolemic.  4.  Hyperlipidemia: Currently on lovastatin   Disposition:   FU with me in 1  months  Signed,  [name], MD  06/12/2018 10:37 AM    [place]

## 2018-06-12 NOTE — Patient Instructions (Signed)
Medication Instructions:  Your physician recommends that you continue on your current medications as directed. Please refer to the Current Medication list given to you today.  Testing/Procedures: Your physician has requested that you have an aorto-iliac duplex. During this test, an ultrasound is used to evaluate the aorta and iliac arteries. Do not eat after midnight the day before and avoid carbonated beverages  Your physician has requested that you have an ankle brachial index (ABI). During this test an ultrasound and blood pressure cuff are used to evaluate the arteries that supply the arms and legs with blood. Allow thirty minutes for this exam. There are no restrictions or special instructions.  Follow-Up: 1 month with Dr. Fletcher Anon  Any Other Special Instructions Will Be Listed Below (If Applicable).     If you need a refill on your cardiac medications before your next appointment, please call your pharmacy.

## 2018-06-21 ENCOUNTER — Encounter (HOSPITAL_COMMUNITY): Payer: Medicare Other

## 2018-06-27 ENCOUNTER — Ambulatory Visit (HOSPITAL_COMMUNITY)
Admission: RE | Admit: 2018-06-27 | Discharge: 2018-06-27 | Disposition: A | Discharge status: Home / Self Care | Payer: Medicare Other | Source: Ambulatory Visit | Attending: Cardiovascular Disease | Admitting: Cardiovascular Disease

## 2018-06-27 DIAGNOSIS — I739 Peripheral vascular disease, unspecified: Secondary | ICD-10-CM | POA: Insufficient documentation

## 2018-07-11 NOTE — Progress Notes (Signed)
HPI: FU atrial fibrillation and diastolic congestive heart failure. Echocardiogram December 2016 showed vigorous LV function. There was mild left atrial enlargement and mild mitral regurgitation. Moderate tricuspid regurgitation with moderately elevated pulmonary pressures. Patient scheduled for TEE guided cardioversion April 2017. This showed normal LV function, mild mitral regurgitation, left atrial appendage thrombus. Cardioversion canceled. TEE repeated June 2017 and showed normal LV systolic function, mild mitral regurgitation, mild to moderate left atrial enlargement but no thrombus, mild to moderate tricuspid regurgitation. Patient subsequently had successful cardioversion. ABIs July 2019 at rest showed normal pressures on the right and moderate left lower extremity arterial disease.  Follow-up with Dr. Fletcher Anon for peripheral vascular disease July 2019 and angiogram recommended.  Patient wanted to consider. Since last seen,patient denies dyspnea, chest pain, palpitations or syncope.  He has chronic edema in his right lower extremity.  He continues to have significant claudication.  Current Outpatient Medications  Medication Sig Dispense Refill  . amLODipine (NORVASC) 10 MG tablet Take 1 tablet (10 mg total) by mouth daily. 90 tablet 2  . losartan-hydrochlorothiazide (HYZAAR) 100-25 MG tablet Take 1 tablet by mouth daily. 90 tablet 2  . lovastatin (MEVACOR) 40 MG tablet Take 1 tablet (40 mg total) by mouth daily. 90 tablet 2  . Multiple Vitamin (MULTIVITAMIN WITH MINERALS) TABS tablet Take 1 tablet by mouth daily. ABC Plus Senior    . Rivaroxaban (XARELTO) 15 MG TABS tablet Take 1 tablet (15 mg total) by mouth daily with supper. 90 tablet 2   No current facility-administered medications for this visit.      Past Medical History:  Diagnosis Date  . Adenomatous colon polyp 04/1985   no further colonoscopy, 2008-last colonoscopy, no polyps    . Atrial fibrillation ([place])   .  Diverticulosis   . History of skin cancer    dermatology every 6 months Dr. [name]  . Hyperlipidemia   . Hypertension   . Macular degeneration    bilateral  . Nephrolithiasis   . PAT (paroxysmal atrial tachycardia) (HCC)    many years ago, worse with smoking    Past Surgical History:  Procedure Laterality Date  . CARDIOVERSION N/A 05/23/2016   Procedure: CARDIOVERSION;  Surgeon: [name], MD;  Location: [facility];  Service: Cardiovascular;  Laterality: N/A;  . CATARACT EXTRACTION     bilateral  . INGUINAL HERNIA REPAIR    . TEE WITHOUT CARDIOVERSION N/A 03/22/2016   Procedure: TRANSESOPHAGEAL ECHOCARDIOGRAM (TEE);  Surgeon: [name], MD;  Location: [place];  Service: Cardiovascular;  Laterality: N/A;  . TEE WITHOUT CARDIOVERSION N/A 05/23/2016   Procedure: TRANSESOPHAGEAL ECHOCARDIOGRAM (TEE);  Surgeon: [name], MD;  Location: [facility] ENDOSCOPY;  Service: Cardiovascular;  Laterality: N/A;  . TENDON REPAIR  12/11   right leg    Social History   Socioeconomic History  . Marital status: Married    Spouse name: Not on file  . Number of children: 5  . Years of education: Not on file  . Highest education level: Not on file  Occupational History  . Occupation: RETIRED    Employer: RETIRED  Social Needs  . Financial resource strain: Not on file  . Food insecurity:    Worry: Not on file    Inability: Not on file  . Transportation needs:    Medical: Not on file    Non-medical: Not on file  Tobacco Use  . Smoking status: Former Smoker    Packs/day: 1.00    Years: 60.00  Pack years: 60.00    Types: Cigarettes    Last attempt to quit: 12/13/2003    Years since quitting: 14.6  . Smokeless tobacco: Never Used  Substance and Sexual Activity  . Alcohol use: Yes    Alcohol/week: 0.0 oz    Comment: 2 glass of wine a day  . Drug use: No  . Sexual activity: Not on file  Lifestyle  . Physical activity:    Days per week: Not on file    Minutes per  session: Not on file  . Stress: Not on file  Relationships  . Social connections:    Talks on phone: Not on file    Gets together: Not on file    Attends religious service: Not on file    Active member of club or organization: Not on file    Attends meetings of clubs or organizations: Not on file    Relationship status: Not on file  . Intimate partner violence:    Fear of current or ex partner: Not on file    Emotionally abused: Not on file    Physically abused: Not on file    Forced sexual activity: Not on file  Other Topics Concern  . Not on file  Social History Narrative   Married (64 years in 08/2015-goes to outside practice). 5 children. 8 grandchildren (lost 1 grandchild #9 to [place] 18,  Lost #9 to seizures)      Retired at [employer], high Cabin crew      Hobbies: time with family, go to beach (51 years in a row), travel    Family History  Problem Relation Age of Onset  . Hypertension Mother   . Cancer Father        lung    ROS: no fevers or chills, productive cough, hemoptysis, dysphasia, odynophagia, melena, hematochezia, dysuria, hematuria, rash, seizure activity, orthopnea, PND. Remaining systems are negative.  Physical Exam: Well-developed well-nourished in no acute distress.  Skin is warm and dry.  HEENT is normal.  Neck is supple.  Chest is clear to auscultation with normal expansion.  Cardiovascular exam is regular rate and rhythm.  Abdominal exam nontender or distended. No masses palpated. Extremities show 1+ edema RLE. neuro grossly intact  ECG-sinus rhythm with PACs.  Left anterior fascicular block.  Right bundle branch block.  Left ventricular hypertrophy.  Personally reviewed  A/P  1 paroxysmal atrial fibrillation-patient remains in sinus rhythm on examination today.  Continue Xarelto.  Check hemoglobin and renal function.  2 hypertension-blood pressure is controlled.  Continue present medications.  3 hyperlipidemia-continue statin.  4  chronic diastolic congestive heart failure-symptoms are much improved in sinus rhythm.  Continue low-sodium diet and fluid restriction.  5 peripheral vascular disease-followed by Dr. Fletcher Anon.  Kirk Ruths, MD

## 2018-07-17 ENCOUNTER — Encounter: Payer: Self-pay | Admitting: Cardiology

## 2018-07-17 ENCOUNTER — Ambulatory Visit (INDEPENDENT_AMBULATORY_CARE_PROVIDER_SITE_OTHER): Payer: Medicare Other | Admitting: Cardiology

## 2018-07-17 VITALS — BP 142/60 | HR 68 | Ht 65.0 in | Wt 157.0 lb

## 2018-07-17 DIAGNOSIS — I10 Essential (primary) hypertension: Secondary | ICD-10-CM

## 2018-07-17 DIAGNOSIS — E7800 Pure hypercholesterolemia, unspecified: Secondary | ICD-10-CM

## 2018-07-17 DIAGNOSIS — I480 Paroxysmal atrial fibrillation: Secondary | ICD-10-CM

## 2018-07-17 NOTE — Patient Instructions (Signed)

## 2018-07-18 ENCOUNTER — Encounter: Payer: Self-pay | Admitting: *Deleted

## 2018-07-18 LAB — BASIC METABOLIC PANEL
BUN/Creatinine Ratio: 20 (ref 10–24)
BUN: 28 mg/dL (ref 10–36)
CALCIUM: 9.2 mg/dL (ref 8.6–10.2)
CHLORIDE: 101 mmol/L (ref 96–106)
CO2: 21 mmol/L (ref 20–29)
Creatinine, Ser: 1.37 mg/dL — ABNORMAL HIGH (ref 0.76–1.27)
GFR calc Af Amer: 51 mL/min/{1.73_m2} — ABNORMAL LOW (ref >59)
GFR, EST NON AFRICAN AMERICAN: 44 mL/min/{1.73_m2} — AB (ref >59)
Glucose: 101 mg/dL — ABNORMAL HIGH (ref 65–99)
Potassium: 4.2 mmol/L (ref 3.5–5.2)
Sodium: 137 mmol/L (ref 134–144)

## 2018-07-18 LAB — CBC
HEMATOCRIT: 39.4 % (ref 37.5–51.0)
Hemoglobin: 13.7 g/dL (ref 13.0–17.7)
MCH: 32.6 pg (ref 26.6–33.0)
MCHC: 34.8 g/dL (ref 31.5–35.7)
MCV: 94 fL (ref 79–97)
PLATELETS: 306 10*3/uL (ref 150–450)
RBC: 4.2 x10E6/uL (ref 4.14–5.80)
RDW: 14.6 % (ref 12.3–15.4)
WBC: 9.8 10*3/uL (ref 3.4–10.8)

## 2018-07-31 ENCOUNTER — Ambulatory Visit (INDEPENDENT_AMBULATORY_CARE_PROVIDER_SITE_OTHER): Payer: Medicare Other | Admitting: Cardiovascular Disease

## 2018-07-31 ENCOUNTER — Encounter: Payer: Self-pay | Admitting: Cardiovascular Disease

## 2018-07-31 VITALS — BP 144/66 | HR 66 | Ht 66.0 in | Wt 158.4 lb

## 2018-07-31 DIAGNOSIS — I4819 Other persistent atrial fibrillation: Secondary | ICD-10-CM

## 2018-07-31 DIAGNOSIS — I5032 Chronic diastolic (congestive) heart failure: Secondary | ICD-10-CM | POA: Diagnosis not present

## 2018-07-31 DIAGNOSIS — I481 Persistent atrial fibrillation: Secondary | ICD-10-CM

## 2018-07-31 DIAGNOSIS — E7800 Pure hypercholesterolemia, unspecified: Secondary | ICD-10-CM

## 2018-07-31 DIAGNOSIS — I739 Peripheral vascular disease, unspecified: Secondary | ICD-10-CM

## 2018-07-31 NOTE — Patient Instructions (Signed)

## 2018-07-31 NOTE — Progress Notes (Signed)
Cardiology Office Note   Date:  07/31/2018   ID:  Andre [name], DOB 01/09/1923, MRN [id number]  PCP:  [name], MD  Cardiologist:  [name]  No chief complaint on file.     History of Present Illness: Andre [name] is a 84 y.o. male who Is here today for a follow-up visit regarding peripheral arterial disease. He has known history of persistent atrial fibrillation status post successful cardioversion in June 2017. He has other medical problems include chronic diastolic heart failure, chronic kidney disease and previous tobacco use.  He is followed  for bilateral leg claudication.  Aortoiliac duplex in 2017 showed  significant bilateral common iliac artery disease slightly worse on the right side. He was seen recently for worsening leg claudication.  I repeated his vascular studies which showed stable mild to moderate decrease ABI with bilateral iliac disease that does not seem to be different from before. He reports that he cut down his walking to half a mile daily with improvement in symptoms.  Past Medical History:  Diagnosis Date  . Adenomatous colon polyp 04/1985   no further colonoscopy, 2008-last colonoscopy, no polyps    . Atrial fibrillation ([place])   . Diverticulosis   . History of skin cancer    dermatology every 6 months Dr. [name]  . Hyperlipidemia   . Hypertension   . Macular degeneration    bilateral  . Nephrolithiasis   . PAT (paroxysmal atrial tachycardia) (HCC)    many years ago, worse with smoking    Past Surgical History:  Procedure Laterality Date  . CARDIOVERSION N/A 05/23/2016   Procedure: CARDIOVERSION;  Surgeon: [name], MD;  Location: [facility];  Service: Cardiovascular;  Laterality: N/A;  . CATARACT EXTRACTION     bilateral  . INGUINAL HERNIA REPAIR    . TEE WITHOUT CARDIOVERSION N/A 03/22/2016   Procedure: TRANSESOPHAGEAL ECHOCARDIOGRAM (TEE);  Surgeon: [name], MD;  Location: [employer];  Service:  Cardiovascular;  Laterality: N/A;  . TEE WITHOUT CARDIOVERSION N/A 05/23/2016   Procedure: TRANSESOPHAGEAL ECHOCARDIOGRAM (TEE);  Surgeon: [name], MD;  Location: [facility] ENDOSCOPY;  Service: Cardiovascular;  Laterality: N/A;  . TENDON REPAIR  12/11   right leg     Current Outpatient Medications  Medication Sig Dispense Refill  . amLODipine (NORVASC) 10 MG tablet Take 1 tablet (10 mg total) by mouth daily. 90 tablet 2  . losartan-hydrochlorothiazide (HYZAAR) 100-25 MG tablet Take 1 tablet by mouth daily. 90 tablet 2  . lovastatin (MEVACOR) 40 MG tablet Take 1 tablet (40 mg total) by mouth daily. 90 tablet 2  . Multiple Vitamin (MULTIVITAMIN WITH MINERALS) TABS tablet Take 1 tablet by mouth daily. ABC Plus Senior    . Rivaroxaban (XARELTO) 15 MG TABS tablet Take 1 tablet (15 mg total) by mouth daily with supper. 90 tablet 2   No current facility-administered medications for this visit.     Allergies:   Patient has no known allergies.    Social History:  The patient  reports that he quit smoking about 14 years ago. His smoking use included cigarettes. He has a 60.00 pack-year smoking history. He has never used smokeless tobacco. He reports that he drinks alcohol. He reports that he does not use drugs.   Family History:  The patient's family history includes Cancer in his father; Hypertension in his mother.    ROS:  Please see the history of present illness.   Otherwise, review of systems are positive  for none.   All other systems are reviewed and negative.    PHYSICAL EXAM: VS:  BP (!) 144/66   Pulse 66   Ht 5\' 6"  (1.676 m)   Wt 158 lb 6.4 oz (71.8 kg)   BMI 25.57 kg/m  , BMI Body mass index is 25.57 kg/m. GEN: Well nourished, well developed, in no acute distress  HEENT: normal  Neck: no JVD, or masses. Bilateral carotid bruits louder on the right side Cardiac: RRR; no  rubs, or gallops,no edema . There is 2/6 systolic ejection murmur in the aortic area.  Respiratory:  clear  to auscultation bilaterally, normal work of breathing GI: soft, nontender, nondistended, + BS MS: no deformity or atrophy  Skin: warm and dry, no rash Neuro:  Strength and sensation are intact Psych: euthymic mood, full affect Vascular: Femoral pulses +1 on the right side and +1 on the left side. Distal pulses are +1.  EKG:  EKG is not ordered today.    Recent Labs: 01/19/2018: ALT 13 07/17/2018: BUN 28; Creatinine, Ser 1.37; Hemoglobin 13.7; Platelets 306; Potassium 4.2; Sodium 137    Lipid Panel    Component Value Date/Time   CHOL 167 01/19/2018 1010   TRIG 102 01/19/2018 1010   TRIG 166 (H) 11/29/2006 1220   HDL 63 01/19/2018 1010   CHOLHDL 2.7 01/19/2018 1010   CHOLHDL 3 02/17/2015 0902   VLDL 29.0 02/17/2015 0902   LDLCALC 84 01/19/2018 1010      Wt Readings from Last 3 Encounters:  07/31/18 158 lb 6.4 oz (71.8 kg)  07/17/18 157 lb (71.2 kg)  06/12/18 157 lb 9.6 oz (71.5 kg)         ASSESSMENT AND PLAN:  1.  Peripheral arterial disease: Stable bilateral leg claudication due to known aortoiliac disease with stable vascular studies. He reports progression of claudication which seems to be severe and lifestyle limiting at the present time.  In spite of his I again discussed management options including endovascular intervention versus continued medical therapy and his main concern seems to be just knowing that this is not a life-threatening duration.  2. Atrial fibrillation: Currently on anticoagulation with Xarelto.  3. Chronic diastolic heart failure: Appears to be euvolemic.  4.  Hyperlipidemia: Currently on lovastatin   Disposition:   FU with me in 6 months  Signed,  [name], MD  07/31/2018 10:05 AM    [place]

## 2018-08-06 ENCOUNTER — Other Ambulatory Visit: Payer: Self-pay | Admitting: Cardiovascular Disease

## 2018-08-06 DIAGNOSIS — Z95828 Presence of other vascular implants and grafts: Secondary | ICD-10-CM

## 2018-08-29 LAB — PSA: PSA: 7.5

## 2018-09-03 ENCOUNTER — Ambulatory Visit (INDEPENDENT_AMBULATORY_CARE_PROVIDER_SITE_OTHER): Payer: Medicare Other | Admitting: Family Medicine

## 2018-09-03 ENCOUNTER — Encounter: Payer: Self-pay | Admitting: Family Medicine

## 2018-09-03 VITALS — BP 140/62 | HR 71 | Temp 98.4°F | Ht 66.0 in | Wt 156.8 lb

## 2018-09-03 DIAGNOSIS — N183 Chronic kidney disease, stage 3 unspecified: Secondary | ICD-10-CM

## 2018-09-03 DIAGNOSIS — I5032 Chronic diastolic (congestive) heart failure: Secondary | ICD-10-CM

## 2018-09-03 DIAGNOSIS — I480 Paroxysmal atrial fibrillation: Secondary | ICD-10-CM

## 2018-09-03 DIAGNOSIS — Z23 Encounter for immunization: Secondary | ICD-10-CM

## 2018-09-03 DIAGNOSIS — I739 Peripheral vascular disease, unspecified: Secondary | ICD-10-CM

## 2018-09-03 DIAGNOSIS — C61 Malignant neoplasm of prostate: Secondary | ICD-10-CM

## 2018-09-03 DIAGNOSIS — E7800 Pure hypercholesterolemia, unspecified: Secondary | ICD-10-CM

## 2018-09-03 DIAGNOSIS — I10 Essential (primary) hypertension: Secondary | ICD-10-CM

## 2018-09-03 MED ORDER — LOSARTAN POTASSIUM-HCTZ 100-25 MG PO TABS: 1.0000 | ORAL_TABLET | Freq: Every day | ORAL | 3 refills | Status: DC

## 2018-09-03 MED ORDER — AMLODIPINE BESYLATE 10 MG PO TABS: 10.0000 mg | ORAL_TABLET | Freq: Every day | ORAL | 3 refills | Status: DC

## 2018-09-03 MED ORDER — ATORVASTATIN CALCIUM 10 MG PO TABS: 10.0000 mg | ORAL_TABLET | Freq: Every day | ORAL | 3 refills | Status: DC

## 2018-09-03 MED ORDER — RIVAROXABAN 15 MG PO TABS: 15.0000 mg | ORAL_TABLET | Freq: Every day | ORAL | 3 refills | Status: DC

## 2018-09-03 NOTE — Progress Notes (Signed)
Subjective:  Andre [name] is a 84 y.o. year old very pleasant male patient who presents for/with See problem oriented charting ROS- leg pain with walking. No chest pain or shortness of breath. No headache or blurry vision.    Past Medical History-  Patient Active Problem List   Diagnosis Date Noted  . Peripheral vascular disease ([place]) 04/21/2016    Priority: High  . Atrial fibrillation ([place]) 03/17/2016    Priority: High  . Diastolic CHF, chronic ([place]) 01/05/2016    Priority: High  . CKD (chronic kidney disease), stage III ([place]) 02/17/2015    Priority: Medium  . Malignant neoplasm of prostate ([place]) 01/05/2010    Priority: Medium  . Hyperlipidemia 12/04/2007    Priority: Medium  . Essential hypertension 12/04/2007    Priority: Medium  . Macular degeneration 02/17/2015    Priority: Low  . Former smoker 02/17/2015    Priority: Low  . History of skin cancer     Priority: Low  . Angiodysplasia of intestine with hemorrhage 05/15/2007    Priority: Low    Medications- reviewed and updated Current Outpatient Medications  Medication Sig Dispense Refill  . amLODipine (NORVASC) 10 MG tablet Take 1 tablet (10 mg total) by mouth daily. 90 tablet 2  . losartan-hydrochlorothiazide (HYZAAR) 100-25 MG tablet Take 1 tablet by mouth daily. 90 tablet 2  . lovastatin (MEVACOR) 40 MG tablet Take 1 tablet (40 mg total) by mouth daily. 90 tablet 2  . Multiple Vitamin (MULTIVITAMIN WITH MINERALS) TABS tablet Take 1 tablet by mouth daily. ABC Plus Senior    . Rivaroxaban (XARELTO) 15 MG TABS tablet Take 1 tablet (15 mg total) by mouth daily with supper. 90 tablet 2   No current facility-administered medications for this visit.     Objective: BP 140/62 (BP Location: Left Arm, Patient Position: Sitting, Cuff Size: Large)   Pulse 71   Temp 98.4 F (36.9 C) (Oral)   Ht 5\' 6"  (1.676 m)   Wt 156 lb 12.8 oz (71.1 kg)   SpO2 94%   BMI 25.31 kg/m  Gen: NAD, resting comfortably Oropharynx  normal, TM with some cerumen bilaterally CV: RRR no murmurs rubs or gallops Lungs: CTAB no crackles, wheeze, rhonchi Abdomen: soft/nontender/nondistended/normal bowel sounds.   Ext: no edema on left, 1+ on right (chronic stable) Skin: warm, dry  Assessment/Plan:  Other notes: 1. wife hip fracture last year and then had another fall this year and fractured elbow 2. Walking a mile a day but legs still hurt- Dr. Fletcher Anon is aware- now down to half mile after talking with Dr. Fletcher Anon 3. Issues with eyes - macular degeneration- hasnt seen in a year- he is a little discouraged since no cure- will follow if worsening 4. Dermatology- Seeing Dr. Ronnald Ramp every 6 months- 2 upcoming Mohs- skin surgery center 5. Passed age based screening for colonoscopy  Diastolic CHF, chronic ([place]) 2 issues contribute to fluid overload- diastolic dysfunction and moderate tricuspid regurgitation. Patient has done really well lately- has not taken lasix since at least June. Weight is stable and no increased edema. Seems watching sodium and fluid restriction are adequate at present.   Atrial fibrillation (HCC) Paroxysmal. He remains on xarelto. As far as he knows has not been in atrial fibrillation in some time. Sounds to be in sinus rhythm today (though obviously cant prove without EKG)  Peripheral vascular disease (HCC) Continues to follow with Dr. Fletcher Anon. On xarelto- and vascular and cardiology have not suggested aspirin. He will continue  his statin. He is down from walking mile a day to half mile a day- this discourages him- but states with claudication does not feel he can do more- encouraged him to at least keep up current exercise level  Malignant neoplasm of prostate Still seeing Dr. Alinda Money q6 montsh for active surveillance. No intervention planned. No biopsy in recent years- watching PSA only   Hyperlipidemia Patient's LDL is at 84 and with PAD we opted to increase from lovastatin 40mg  to atorvastatin 10mg - can  recheck lipids next visit if hasnt had done with cardiology  Essential hypertension Mild poor control today. Patient with macular degeneration though and becoming less confident with footing. He is also caring for his wife who is less stable on feet and noted recent falls.  I am hesitant to increase meds at his age. Will tolerate perhaps <145/90. Continue losartan-hctz 100-25 mg and amlodipine 10mg  (not ideal with edema/CHF history but will maintain for now due to borderline BP control.   CKD (chronic kidney disease), stage III GFR in 40s more recently (thus reduced xarelto dose). He still wants to be seen just a year by me- if GFR dips into 30s would absolutely want q6 month checks here- patient fortunately gets labs pretty regularly with cardiology when not seeing me  Future Appointments  Date Time Provider [place]  09/04/2019 10:40 AM [name], MD [facility]   Lab/Order associations: Need for prophylactic vaccination and inoculation against influenza - Plan: Flu vaccine HIGH DOSE PF  Diastolic CHF, chronic (HCC)  Paroxysmal atrial fibrillation ([place])  Peripheral vascular disease ([place])  Malignant neoplasm of prostate ([place])  Pure hypercholesterolemia  Essential hypertension  CKD (chronic kidney disease), stage III ([place])  Meds ordered this encounter  Medications  . Rivaroxaban (XARELTO) 15 MG TABS tablet    Sig: Take 1 tablet (15 mg total) by mouth daily with supper.    Dispense:  90 tablet    Refill:  3  . losartan-hydrochlorothiazide (HYZAAR) 100-25 MG tablet    Sig: Take 1 tablet by mouth daily.    Dispense:  90 tablet    Refill:  3  . amLODipine (NORVASC) 10 MG tablet    Sig: Take 1 tablet (10 mg total) by mouth daily.    Dispense:  90 tablet    Refill:  3  . atorvastatin (LIPITOR) 10 MG tablet    Sig: Take 1 tablet (10 mg total) by mouth daily.    Dispense:  90 tablet    Refill:  3    Return precautions advised.  Garret Reddish, MD

## 2018-09-03 NOTE — Patient Instructions (Addendum)
Stop lovastatin once you run out  Then start atorvastatin 10mg  (stronger cholesterol medicine to try to get bad cholesterol under 70).   See you in a year- happy to see you sooner if needed

## 2018-09-04 NOTE — Assessment & Plan Note (Signed)
Paroxysmal. He remains on xarelto. As far as he knows has not been in atrial fibrillation in some time. Sounds to be in sinus rhythm today (though obviously cant prove without EKG)

## 2018-09-04 NOTE — Assessment & Plan Note (Addendum)
Mild poor control today. Patient with macular degeneration though and becoming less confident with footing. He is also caring for his wife who is less stable on feet and noted recent falls.  I am hesitant to increase meds at his age. Will tolerate perhaps <145/90. Continue losartan-hctz 100-25 mg and amlodipine 10mg  (not ideal with edema/CHF history but will maintain for now due to borderline BP control.

## 2018-09-04 NOTE — Assessment & Plan Note (Signed)
Continues to follow with Dr. Fletcher Anon. On xarelto- and vascular and cardiology have not suggested aspirin. He will continue his statin. He is down from walking mile a day to half mile a day- this discourages him- but states with claudication does not feel he can do more- encouraged him to at least keep up current exercise level

## 2018-09-04 NOTE — Assessment & Plan Note (Signed)
2 issues contribute to fluid overload- diastolic dysfunction and moderate tricuspid regurgitation. Patient has done really well lately- has not taken lasix since at least June. Weight is stable and no increased edema. Seems watching sodium and fluid restriction are adequate at present.

## 2018-09-04 NOTE — Assessment & Plan Note (Signed)
Patient's LDL is at 84 and with PAD we opted to increase from lovastatin 40mg  to atorvastatin 10mg - can recheck lipids next visit if hasnt had done with cardiology

## 2018-09-04 NOTE — Assessment & Plan Note (Signed)
GFR in 40s more recently (thus reduced xarelto dose). He still wants to be seen just a year by me- if GFR dips into 30s would absolutely want q6 month checks here- patient fortunately gets labs pretty regularly with cardiology when not seeing me

## 2018-09-04 NOTE — Assessment & Plan Note (Signed)
Still seeing Dr. Sherie Don montsh for active surveillance. No intervention planned. No biopsy in recent years- watching PSA only

## 2018-09-11 ENCOUNTER — Encounter: Payer: Self-pay | Admitting: Family Medicine

## 2018-12-11 ENCOUNTER — Encounter: Payer: Self-pay | Admitting: Family Medicine

## 2018-12-11 ENCOUNTER — Ambulatory Visit (INDEPENDENT_AMBULATORY_CARE_PROVIDER_SITE_OTHER): Payer: Medicare Other | Admitting: Family Medicine

## 2018-12-11 VITALS — BP 150/68 | HR 76 | Temp 97.7°F | Ht 66.0 in | Wt 158.0 lb

## 2018-12-11 DIAGNOSIS — I10 Essential (primary) hypertension: Secondary | ICD-10-CM

## 2018-12-11 DIAGNOSIS — R109 Unspecified abdominal pain: Secondary | ICD-10-CM

## 2018-12-11 LAB — POC URINALSYSI DIPSTICK (AUTOMATED)
BILIRUBIN UA: NEGATIVE
Blood, UA: NEGATIVE
Glucose, UA: NEGATIVE
KETONES UA: NEGATIVE
LEUKOCYTES UA: NEGATIVE
Nitrite, UA: NEGATIVE
PROTEIN UA: POSITIVE — AB
Spec Grav, UA: 1.01 (ref 1.010–1.025)
Urobilinogen, UA: 0.2 E.U./dL
pH, UA: 7 (ref 5.0–8.0)

## 2018-12-11 NOTE — Progress Notes (Signed)
Subjective:  Andre [name] is a 84 y.o. year old very pleasant male patient who presents for/with See problem oriented charting ROS-No saddle anesthesia, bladder incontinence, fecal incontinence, weakness in extremity, numbness or tingling in extremity. History negative for trauma,  fever, chills, unintentional weight loss, recent bacterial infection, recent IV drug use, HIV, pain worse at night (as long as doesn't lay on right side) or while supine.    Past Medical History-  Patient Active Problem List   Diagnosis Date Noted  . Peripheral vascular disease ([place]) 04/21/2016    Priority: High  . Atrial fibrillation ([place]) 03/17/2016    Priority: High  . Diastolic CHF, chronic ([place]) 01/05/2016    Priority: High  . CKD (chronic kidney disease), stage III ([place]) 02/17/2015    Priority: Medium  . Malignant neoplasm of prostate ([place]) 01/05/2010    Priority: Medium  . Hyperlipidemia 12/04/2007    Priority: Medium  . Essential hypertension 12/04/2007    Priority: Medium  . Macular degeneration 02/17/2015    Priority: Low  . Former smoker 02/17/2015    Priority: Low  . History of skin cancer     Priority: Low  . Angiodysplasia of intestine with hemorrhage 05/15/2007    Priority: Low    Medications- reviewed and updated Current Outpatient Medications  Medication Sig Dispense Refill  . amLODipine (NORVASC) 10 MG tablet Take 1 tablet (10 mg total) by mouth daily. 90 tablet 3  . atorvastatin (LIPITOR) 10 MG tablet Take 1 tablet (10 mg total) by mouth daily. 90 tablet 3  . losartan-hydrochlorothiazide (HYZAAR) 100-25 MG tablet Take 1 tablet by mouth daily. 90 tablet 3  . Multiple Vitamin (MULTIVITAMIN WITH MINERALS) TABS tablet Take 1 tablet by mouth daily. ABC Plus Senior    . Rivaroxaban (XARELTO) 15 MG TABS tablet Take 1 tablet (15 mg total) by mouth daily with supper. 90 tablet 3   Objective: BP (!) 150/68 (BP Location: Left Arm, Cuff Size: Large)   Pulse 76   Temp 97.7 F (36.5  C) (Oral)   Ht 5\' 6"  (1.676 m)   Wt 158 lb (71.7 kg)   SpO2 96%   BMI 25.50 kg/m  Gen: NAD, resting comfortably CV: RRR  Lungs: nonlabored, normal respiratory rate Abdomen: soft/nondistended Skin: warm, dry Back - Normal skin, Spine with normal alignment and no deformity.  No tenderness to vertebral process palpation.  Paraspinous muscles are tender and with spasm in right mid to low back.    Neuro- no saddle anesthesia, 5/5 strength lower extremities, 2 normal gait   Results for orders placed or performed in visit on 12/11/18 (from the past 24 hour(s))  POCT Urinalysis Dipstick (Automated)     Status: Abnormal   Collection Time: 12/11/18  9:47 AM  Result Value Ref Range   Color, UA Yellow    Clarity, UA Clear    Glucose, UA Negative Negative   Bilirubin, UA Negative    Ketones, UA Negative    Spec Grav, UA 1.010 1.010 - 1.025   Blood, UA Negative    pH, UA 7.0 5.0 - 8.0   Protein, UA Positive (A) Negative   Urobilinogen, UA 0.2 0.2 or 1.0 E.U./dL   Nitrite, UA Negative    Leukocytes, UA Negative Negative    Assessment/Plan:   Right mid to low pain - Plan: POCT Urinalysis Dipstick (Automated)  S: Pain started 3 weeks ago. Couldn't sleep for first 2 days. Took tylenol scheduled for first 2-3 days and could finally rest.  Pain is improving some. Pain in right mid to low back to right of spine- to the left of right CVA area. No midline pain though last week or two did note some midline pain. Does ok if sleep on left side, gets worse pain if sleeps on right side.   Tried heating pad and worked while on the pad but as soon as he stopped that it stopped working. Pain worse in the AM when gets up from kneeling for his prayers. Pain down to mild ache today.    A/P: 84 year old male with a right mid to low back pain-does have significant spasm in the muscles.  His pain overall is improving so we discussed continuing to monitor this versus options of physical therapy or massage.  He  opts for continued monitoring given only mild ache at this time.  He agrees to come back if not improving within 3 to 4 weeks.  His urinalysis was reassuring in regards to possible infectious cause- this was done by nursing staff but on exam there is no reason to suspect urological cause.Last PSA was actually trending down just a few months ago in reference to history prostate cancer-doubt current issues related to metastasis.  Will get x-ray of follow-up if pain persists.  We discussed he does likely have some underlying arthritis in the spine and a flareup of that could have produced associated muscle spasms.  Essential hypertension S: Poorly controlled on amlodipine 10 mg, losartan hydrochlorothiazide 100-25 mg BP Readings from Last 3 Encounters:  12/11/18 (!) 160/72--> 150/68  09/03/18 140/62  07/31/18 (!) 144/66  A/P: We discussed blood pressure goal of <145/90 if possible- balancing comorbidities and potential for orthostasic hypotension at falls given advanced age. Continue current meds: He has a follow-up with cardiology in approximately a month and he does not want to make any changes until that evaluation.  Could consider adding beta-blocker  Patient also thinks blood pressure may be up due to less walking-related to back pain as well as some holiday stress so hopefully those issues will be behind him by time of repeat pressure evaluation  Future Appointments  Date Time Provider [place]  01/21/2019 10:40 AM Lelon Perla, MD [facility]  01/29/2019 10:20 AM Wellington Hampshire, MD [facility]  09/04/2019 10:40 AM [name], MD [facility]   Lab/Order associations: Right flank pain - Plan: POCT Urinalysis Dipstick (Automated)  Essential hypertension   Return precautions advised.  Garret Reddish, MD

## 2018-12-11 NOTE — Patient Instructions (Addendum)
Blood pressure is slightly high today at 150. Since you are seeing [name] soon- lets do a recheck at that visit- if remains above 140-145 at that time then may need to add additional medicine. Try to remain active and watch the salt in the diet to help control this.   Since your pain is improving in the back- you opted to continue to monitor. If pain persists in about a month lets check back in.   We also discussed possible massage or PT as options which you declined for now.

## 2018-12-11 NOTE — Assessment & Plan Note (Signed)
S: Poorly controlled on amlodipine 10 mg, losartan hydrochlorothiazide 100-25 mg BP Readings from Last 3 Encounters:  12/11/18 (!) 160/72--> 150/68  09/03/18 140/62  07/31/18 (!) 144/66  A/P: We discussed blood pressure goal of <145/90 if possible- balancing comorbidities and potential for orthostasic hypotension at falls given advanced age. Continue current meds: He has a follow-up with cardiology in approximately a month and he does not want to make any changes until that evaluation.  Could consider adding beta-blocker  Patient also thinks blood pressure may be up due to less walking-related to back pain as well as some holiday stress so hopefully those issues will be behind him by time of repeat pressure evaluation

## 2018-12-17 ENCOUNTER — Telehealth: Payer: Self-pay | Admitting: Family Medicine

## 2018-12-17 NOTE — Telephone Encounter (Signed)
See note

## 2018-12-17 NOTE — Telephone Encounter (Signed)
Copied from [place] [id number]. Topic: Quick Communication - Rx Refill/Question >> Dec 17, 2018  1:08 PM Reyne Dumas L wrote: Medication: Rivaroxaban (XARELTO) 15 MG TABS tablet  Pt states he needs to speak with a nurse to discuss Rivaroxaban (XARELTO) 15 MG TABS tablet.  States that it is too expensive and he would like to speak about what alternatives are.  Has the patient contacted their pharmacy? yes (Agent: If no, request that the patient contact the pharmacy for the refill.) (Agent: If yes, when and what did the pharmacy advise?)  Preferred Pharmacy (with phone number or street name): [facility] [place] [place] to [facility] [facility] [phone] (Phone) [phone] (Fax)  Agent: Please be advised that RX refills may take up to 3 business days. We ask that you follow-up with your pharmacy.

## 2018-12-18 NOTE — Telephone Encounter (Signed)
We could try to transition him to coumadin and have him come in for regular INR checks. Could set him up with a visit with Jenny Reichmann to transition to this.

## 2018-12-19 NOTE — Telephone Encounter (Signed)
See note  Copied from [place] [phone]. Topic: Quick Communication - Rx Refill/Question >> Dec 17, 2018  1:08 PM Reyne Dumas L wrote: Medication: Rivaroxaban (XARELTO) 15 MG TABS tablet  Pt states he needs to speak with a nurse to discuss Rivaroxaban (XARELTO) 15 MG TABS tablet.  States that it is too expensive and he would like to speak about what alternatives are.  Has the patient contacted their pharmacy? yes (Agent: If no, request that the patient contact the pharmacy for the refill.) (Agent: If yes, when and what did the pharmacy advise?)  Preferred Pharmacy (with phone number or street name): [facility] [place], [place] to [facility] [phone] (Phone) [phone] (Fax)  Agent: Please be advised that RX refills may take up to 3 business days. We ask that you follow-up with your pharmacy. >> Dec 19, 2018 10:38 AM Yvette Rack wrote: Pt stated he has yet to hear back from a nurse. Pt requests that a nurse return his call. Cb# [phone]

## 2018-12-19 NOTE — Telephone Encounter (Signed)
Aspirin will not reduce his risk of stroke. Coumadin will reduce his risk of stroke but will require more regular visits for INR checks

## 2018-12-19 NOTE — Telephone Encounter (Signed)
Would he like to sit down to discuss my comments below?

## 2018-12-19 NOTE — Telephone Encounter (Signed)
Called and left a voicemail message asking patient to return my call to discuss his options

## 2018-12-19 NOTE — Telephone Encounter (Signed)
Called and spoke to patient who is requesting to go back on Aspirin. His insurance is charging a deductible this year and they have raised the price of Xarelto. Please advise

## 2018-12-20 NOTE — Telephone Encounter (Signed)
Called and spoke with patient who states he is just going to stay with Xarelto at this time. He will just pay the cost for it.

## 2018-12-28 NOTE — Telephone Encounter (Signed)
Patient calling back regarding this medication. Patient inquired if it would be possible to contact pharmacy on file and request "4-5 tablets" as he states he has been out for 2 days and is waiting for his refill to come through mail order. Patient wanted to note if this is requested before 11am 1/17, they will deliver tablets to him. Please advise.    [place], [place] [phone] (Phone) [phone] (Fax)

## 2018-12-28 NOTE — Telephone Encounter (Signed)
Offered samples to the patient her in the office, but he states he is unable to drive.  He is going to call his daughter and then call us back and let us know if she can come and pick up the samples.  CRM placed.

## 2018-12-28 NOTE — Telephone Encounter (Signed)
Noted.  Samples placed at front desk for pick up.  Medication Samples have been provided to the patient.  Drug name: Xarelto       Strength: 15 mg        Qty: 14 tablets (2 bottles)  LOT: [id number]  Exp.Date: 12-20  Dosing instructions: Take 1 tablet by mouth daily with supper.  The patient has been instructed regarding the correct time, dose, and frequency of taking this medication, including desired effects and most common side effects.   Marcine Gadway m Kenyotta Dorfman 10:45 AM 12/28/2018

## 2018-12-28 NOTE — Telephone Encounter (Signed)
Patient called back stating his daughter is able to pick up samples from office today. Patients daughters name is Etheleen Nicks.

## 2018-12-28 NOTE — Telephone Encounter (Signed)
See request °

## 2018-12-28 NOTE — Telephone Encounter (Signed)
See note. This appears to already have been taken care of by [employer].

## 2019-01-11 NOTE — Progress Notes (Signed)
HPI: FU atrial fibrillation and diastolic congestive heart failure. Echocardiogram December 2016 showed vigorous LV function. There was mild left atrial enlargement and mild mitral regurgitation. Moderate tricuspid regurgitation with moderately elevated pulmonary pressures. Patient scheduled for TEE guided cardioversion April 2017. This showed normal LV function, mild mitral regurgitation, left atrial appendage thrombus. Cardioversion canceled. TEE repeated June 2017 and showed normal LV systolic function, mild mitral regurgitation, mild to moderate left atrial enlargement but no thrombus, mild to moderate tricuspid regurgitation. Patient subsequently had successful cardioversion. ABIs July 2019 at rest showed normal pressures on the right and moderate left lower extremity arterial disease.  Follow-up with Dr. Fletcher Anon for peripheral vascular disease July 2019 and angiogram recommended.  Patient wanted to consider. Since last seen,patient denies dyspnea, chest pain, palpitations, syncope or bleeding.  He continues to have bilateral lower extremity claudication but can walk 1/2 mile.  Current Outpatient Medications  Medication Sig Dispense Refill  . amLODipine (NORVASC) 10 MG tablet Take 1 tablet (10 mg total) by mouth daily. 90 tablet 3  . atorvastatin (LIPITOR) 10 MG tablet Take 1 tablet (10 mg total) by mouth daily. 90 tablet 3  . losartan-hydrochlorothiazide (HYZAAR) 100-25 MG tablet Take 1 tablet by mouth daily. 90 tablet 3  . Multiple Vitamin (MULTIVITAMIN WITH MINERALS) TABS tablet Take 1 tablet by mouth daily. ABC Plus Senior    . Rivaroxaban (XARELTO) 15 MG TABS tablet Take 1 tablet (15 mg total) by mouth daily with supper. 90 tablet 3   No current facility-administered medications for this visit.      Past Medical History:  Diagnosis Date  . Adenomatous colon polyp 04/1985   no further colonoscopy, 2008-last colonoscopy, no polyps    . Atrial fibrillation ([place])   . Diverticulosis     . History of skin cancer    dermatology every 6 months Dr. [name]  . Hyperlipidemia   . Hypertension   . Macular degeneration    bilateral  . Nephrolithiasis   . PAF (paroxysmal atrial fibrillation) ([place])   . PAT (paroxysmal atrial tachycardia) ([facility])    many years ago, worse with smoking    Past Surgical History:  Procedure Laterality Date  . CARDIOVERSION N/A 05/23/2016   Procedure: CARDIOVERSION;  Surgeon: [name], MD;  Location: [facility];  Service: Cardiovascular;  Laterality: N/A;  . CATARACT EXTRACTION     bilateral  . INGUINAL HERNIA REPAIR    . TEE WITHOUT CARDIOVERSION N/A 03/22/2016   Procedure: TRANSESOPHAGEAL ECHOCARDIOGRAM (TEE);  Surgeon: [name], MD;  Location: [facility];  Service: Cardiovascular;  Laterality: N/A;  . TEE WITHOUT CARDIOVERSION N/A 05/23/2016   Procedure: TRANSESOPHAGEAL ECHOCARDIOGRAM (TEE);  Surgeon: [name], MD;  Location: [facility];  Service: Cardiovascular;  Laterality: N/A;  . TENDON REPAIR  12/11   right leg    Social History   Socioeconomic History  . Marital status: Married    Spouse name: Not on file  . Number of children: 5  . Years of education: Not on file  . Highest education level: Not on file  Occupational History  . Occupation: RETIRED    Employer: RETIRED  Social Needs  . Financial resource strain: Not on file  . Food insecurity:    Worry: Not on file    Inability: Not on file  . Transportation needs:    Medical: Not on file    Non-medical: Not on file  Tobacco Use  . Smoking status: Former Smoker  Packs/day: 1.00    Years: 60.00    Pack years: 60.00    Types: Cigarettes    Last attempt to quit: 12/13/2003    Years since quitting: 15.1  . Smokeless tobacco: Never Used  Substance and Sexual Activity  . Alcohol use: Yes    Alcohol/week: 0.0 standard drinks    Comment: 2 glass of wine a day  . Drug use: No  . Sexual activity: Not on file  Lifestyle  . Physical activity:     Days per week: Not on file    Minutes per session: Not on file  . Stress: Not on file  Relationships  . Social connections:    Talks on phone: Not on file    Gets together: Not on file    Attends religious service: Not on file    Active member of club or organization: Not on file    Attends meetings of clubs or organizations: Not on file    Relationship status: Not on file  . Intimate partner violence:    Fear of current or ex partner: Not on file    Emotionally abused: Not on file    Physically abused: Not on file    Forced sexual activity: Not on file  Other Topics Concern  . Not on file  Social History Narrative   Married (64 years in 08/2015-goes to outside practice). 5 children. 8 grandchildren (lost 1 grandchild #9 to [place] 18,  Lost #9 to seizures)      Retired at [employer], high Cabin crew      Hobbies: time with family, go to beach (51 years in a row), travel    Family History  Problem Relation Age of Onset  . Hypertension Mother   . Cancer Father        lung    ROS: no fevers or chills, productive cough, hemoptysis, dysphasia, odynophagia, melena, hematochezia, dysuria, hematuria, rash, seizure activity, orthopnea, PND, pedal edema. Remaining systems are negative.  Physical Exam: Well-developed well-nourished in no acute distress.  Skin is warm and dry.  HEENT is normal.  Neck is supple.  Chest is clear to auscultation with normal expansion.  Cardiovascular exam is regular rate and rhythm.  Abdominal exam nontender or distended. No masses palpated. Extremities show no edema. neuro grossly intact  A/P  1 paroxysmal atrial fibrillation-patient remains in sinus rhythm.  Continue Xarelto.  2 hypertension-patient's blood pressure is controlled today.  Continue present medications and follow.  3 hyperlipidemia-continue statin.  4 chronic diastolic congestive heart failure-patient remains euvolemic on examination.  Previous heart failure felt  predominantly related to atrial fibrillation.  Continue fluid restriction and low-sodium diet.  5 peripheral vascular disease-followed by Dr. Fletcher Anon.  Continue statin.  No aspirin given need for anticoagulation.  Kirk Ruths, MD

## 2019-01-21 ENCOUNTER — Encounter (INDEPENDENT_AMBULATORY_CARE_PROVIDER_SITE_OTHER): Payer: Self-pay

## 2019-01-21 ENCOUNTER — Encounter: Payer: Self-pay | Admitting: Cardiology

## 2019-01-21 ENCOUNTER — Ambulatory Visit (INDEPENDENT_AMBULATORY_CARE_PROVIDER_SITE_OTHER): Payer: Medicare Other | Admitting: Cardiology

## 2019-01-21 VITALS — BP 124/55 | HR 82 | Ht 66.0 in | Wt 155.8 lb

## 2019-01-21 DIAGNOSIS — I5032 Chronic diastolic (congestive) heart failure: Secondary | ICD-10-CM | POA: Diagnosis not present

## 2019-01-21 DIAGNOSIS — E7800 Pure hypercholesterolemia, unspecified: Secondary | ICD-10-CM | POA: Diagnosis not present

## 2019-01-21 DIAGNOSIS — I38 Endocarditis, valve unspecified: Secondary | ICD-10-CM

## 2019-01-21 DIAGNOSIS — I480 Paroxysmal atrial fibrillation: Secondary | ICD-10-CM

## 2019-01-21 DIAGNOSIS — I10 Essential (primary) hypertension: Secondary | ICD-10-CM

## 2019-01-21 NOTE — Patient Instructions (Signed)
Medication Instructions:  NO CHANGE If you need a refill on your cardiac medications before your next appointment, please call your pharmacy.   Lab work: If you have labs (blood work) drawn today and your tests are completely normal, you will receive your results only by: . MyChart Message (if you have MyChart) OR . A paper copy in the mail If you have any lab test that is abnormal or we need to change your treatment, we will call you to review the results.  Follow-Up: At [facility], you and your health needs are our priority.  As part of our continuing mission to provide you with exceptional heart care, we have created designated Provider Care Teams.  These Care Teams include your primary Cardiologist (physician) and Advanced Practice Providers (APPs -  Physician Assistants and Nurse Practitioners) who all work together to provide you with the care you need, when you need it. You will need a follow up appointment in 6 months.  Please call our office 2 months in advance to schedule this appointment.  You may see BRIAN CRENSHAW MD or one of the following Advanced Practice Providers on your designated Care Team:   Luke Kilroy, PA-C Krista Kroeger, PA-C . Callie [place], PA-C  CALL IN June TO SCHEDULE APPOINTMENT IN AUGUST   

## 2019-01-29 ENCOUNTER — Encounter: Payer: Self-pay | Admitting: Cardiovascular Disease

## 2019-01-29 ENCOUNTER — Ambulatory Visit (INDEPENDENT_AMBULATORY_CARE_PROVIDER_SITE_OTHER): Payer: Medicare Other | Admitting: Cardiovascular Disease

## 2019-01-29 VITALS — BP 168/70 | HR 68 | Ht 65.0 in | Wt 155.6 lb

## 2019-01-29 DIAGNOSIS — I480 Paroxysmal atrial fibrillation: Secondary | ICD-10-CM | POA: Diagnosis not present

## 2019-01-29 DIAGNOSIS — I5032 Chronic diastolic (congestive) heart failure: Secondary | ICD-10-CM

## 2019-01-29 DIAGNOSIS — I739 Peripheral vascular disease, unspecified: Secondary | ICD-10-CM | POA: Diagnosis not present

## 2019-01-29 DIAGNOSIS — E785 Hyperlipidemia, unspecified: Secondary | ICD-10-CM | POA: Diagnosis not present

## 2019-01-29 NOTE — Progress Notes (Signed)
Cardiology Office Note   Date:  01/29/2019   ID:  Andre [name], DOB 1923/01/30, MRN [id number]  PCP:  [name], MD  Cardiologist:  [name]  No chief complaint on file.     History of Present Illness: Andre [name] is a 85 y.o. male who Is here today for a follow-up visit regarding peripheral arterial disease. He has known history of persistent atrial fibrillation status post successful cardioversion in June 2017. He has other medical problems include chronic diastolic heart failure, chronic kidney disease and previous tobacco use.  He is followed  for bilateral leg claudication due to known iliac disease.  He has been treated medically with stable claudication. He walks half a mile every day for exercise.  Past Medical History:  Diagnosis Date  . Adenomatous colon polyp 04/1985   no further colonoscopy, 2008-last colonoscopy, no polyps    . Atrial fibrillation ([place])   . Diverticulosis   . History of skin cancer    dermatology every 6 months Dr. [name]  . Hyperlipidemia   . Hypertension   . Macular degeneration    bilateral  . Nephrolithiasis   . PAF (paroxysmal atrial fibrillation) ([place])   . PAT (paroxysmal atrial tachycardia) ([facility])    many years ago, worse with smoking    Past Surgical History:  Procedure Laterality Date  . CARDIOVERSION N/A 05/23/2016   Procedure: CARDIOVERSION;  Surgeon: [name], MD;  [facility];  Service: Cardiovascular;  Laterality: N/A;  . CATARACT EXTRACTION     bilateral  . INGUINAL HERNIA REPAIR    . TEE WITHOUT CARDIOVERSION N/A 03/22/2016   Procedure: TRANSESOPHAGEAL ECHOCARDIOGRAM (TEE);  Surgeon: [name], MD;  Location: [place];  Service: Cardiovascular;  Laterality: N/A;  . TEE WITHOUT CARDIOVERSION N/A 05/23/2016   Procedure: TRANSESOPHAGEAL ECHOCARDIOGRAM (TEE);  Surgeon: [name], MD;  Location: [facility] END[facility]COPY;  Service: Cardiovascular;  Laterality: N/A;  . TENDON REPAIR   12/11   right leg     Current Outpatient Medications  Medication Sig Dispense Refill  . amLODipine (NORVASC) 10 MG tablet Take 1 tablet (10 mg total) by mouth daily. 90 tablet 3  . atorvastatin (LIPITOR) 10 MG tablet Take 1 tablet (10 mg total) by mouth daily. 90 tablet 3  . losartan-hydrochlorothiazide (HYZAAR) 100-25 MG tablet Take 1 tablet by mouth daily. 90 tablet 3  . Multiple Vitamin (MULTIVITAMIN WITH MINERALS) TABS tablet Take 1 tablet by mouth daily. ABC Plus Senior    . Rivaroxaban (XARELTO) 15 MG TABS tablet Take 1 tablet (15 mg total) by mouth daily with supper. 90 tablet 3   No current facility-administered medications for this visit.     Allergies:   Patient has no known allergies.    Social History:  The patient  reports that he quit smoking about 15 years ago. His smoking use included cigarettes. He has a 60.00 pack-year smoking history. He has never used smokeless tobacco. He reports current alcohol use. He reports that he does not use drugs.   Family History:  The patient's family history includes Cancer in his father; Hypertension in his mother.    ROS:  Please see the history of present illness.   Otherwise, review of systems are positive for none.   All other systems are reviewed and negative.    PHYSICAL EXAM: VS:  BP (!) 168/70   Pulse 68   Ht 5\' 5"  (1.651 m)   Wt 155 lb 9.6 oz (70.6 kg)  BMI 25.89 kg/m  , BMI Body mass index is 25.89 kg/m. GEN: Well nourished, well developed, in no acute distress  HEENT: normal  Neck: no JVD, or masses. Bilateral carotid bruits louder on the right side Cardiac: RRR; no  rubs, or gallops,no edema . There is 2/6 systolic ejection murmur in the aortic area.  Respiratory:  clear to auscultation bilaterally, normal work of breathing GI: soft, nontender, nondistended, + BS MS: no deformity or atrophy  Skin: warm and dry, no rash Neuro:  Strength and sensation are intact Psych: euthymic mood, full affect Vascular:  Femoral pulses +1 on the right side and +1 on the left side. Distal pulses are +1.  EKG:  EKG is not ordered today.    Recent Labs: 07/17/2018: BUN 28; Creatinine, Ser 1.37; Hemoglobin 13.7; Platelets 306; Potassium 4.2; Sodium 137    Lipid Panel    Component Value Date/Time   CHOL 167 01/19/2018 1010   TRIG 102 01/19/2018 1010   TRIG 166 (H) 11/29/2006 1220   HDL 63 01/19/2018 1010   CHOLHDL 2.7 01/19/2018 1010   CHOLHDL 3 02/17/2015 0902   VLDL 29.0 02/17/2015 0902   LDLCALC 84 01/19/2018 1010      Wt Readings from Last 3 Encounters:  01/29/19 155 lb 9.6 oz (70.6 kg)  01/21/19 155 lb 12.8 oz (70.7 kg)  12/11/18 158 lb (71.7 kg)         ASSESSMENT AND PLAN:  1.  Peripheral arterial disease: Stable bilateral leg claudication due to known aortoiliac disease. Continue medical therapy for now and reserve angiography for worsening symptoms.  2. Atrial fibrillation: Currently on anticoagulation with Xarelto.  3. Chronic diastolic heart failure: Appears to be euvolemic.  4.  Hyperlipidemia: Currently on lovastatin   Disposition:   FU with me in 9 months  Signed,  [name], MD  01/29/2019 10:39 AM    [place]

## 2019-01-29 NOTE — Patient Instructions (Signed)
Medication Instructions:  No changes If you need a refill on your cardiac medications before your next appointment, please call your pharmacy.   Lab work: None ordered  Testing/Procedures: None ordered  Follow-Up: At [facility], you and your health needs are our priority.  As part of our continuing mission to provide you with exceptional heart care, we have created designated Provider Care Teams.  These Care Teams include your primary Cardiologist (physician) and Advanced Practice Providers (APPs -  Physician Assistants and Nurse Practitioners) who all work together to provide you with the care you need, when you need it. You will need a follow up appointment in 9 months.  Please call our office 2 months in advance to schedule this appointment.  You may see Dr. Fletcher Anon or one of the following Advanced Practice Providers on your designated Care Team:   Kerin Ransom, PA-C Roby Lofts, [place] . Sande Rives, PA-C

## 2019-02-20 ENCOUNTER — Telehealth: Payer: Self-pay | Admitting: Cardiology

## 2019-02-20 NOTE — Telephone Encounter (Signed)
Returned call to patient he stated he feels like heart out of rhythm.Stated for the past 1 week he is sob when he walks.Stated Dr.Crenshaw told him to call next time he felt heart out of rhythm.Spoke to DOD Dr.Harding he advised to go to [facility] ED.Patient stated he has macular degeneration and he cannot drive.He takes care of his wife.He does not want to go to ED.Stated he wants to see Dr.Crenshaw.Advised Dr.Crenshaw out of office today.Advised to I will send message to Dr.Crenshaw's RN.

## 2019-02-20 NOTE — Telephone Encounter (Signed)
Spoke with pt, he feels fine, just a little SOB with exertion and he feels a skip in his rhythm. He can not feel his heart racing. He does not want to go to the ER. He has not missed any of his medicines. Follow up scheduled with APP Friday this week. Patient will call back if his statis changes.

## 2019-02-20 NOTE — Telephone Encounter (Signed)
Saw [name] in Feb, and all was fine. Over the past couple days he noticed his legs hurt more, and he was SOB after his walks.   Pt c/o Shortness Of Breath: STAT if SOB developed within the last 24 hours or pt is noticeably SOB on the phone  1. Are you currently SOB (can you hear that pt is SOB on the phone)? No   2. How long have you been experiencing SOB? Within the past week  3. Are you SOB when sitting or when up moving around? After doing his morning walk.  4. Are you currently experiencing any other symptoms?  His legs hurt as well  No diagnosed Afib, but worried his heart is out of rhythm.  Was hoping to get in to be seen sooner

## 2019-02-22 ENCOUNTER — Other Ambulatory Visit: Payer: Self-pay

## 2019-02-22 ENCOUNTER — Ambulatory Visit (INDEPENDENT_AMBULATORY_CARE_PROVIDER_SITE_OTHER): Payer: Medicare Other | Admitting: Physician Assistant

## 2019-02-22 ENCOUNTER — Encounter: Payer: Self-pay | Admitting: Physician Assistant

## 2019-02-22 VITALS — BP 142/66 | HR 43 | Ht 65.0 in | Wt 160.2 lb

## 2019-02-22 DIAGNOSIS — R011 Cardiac murmur, unspecified: Secondary | ICD-10-CM

## 2019-02-22 DIAGNOSIS — M79604 Pain in right leg: Secondary | ICD-10-CM

## 2019-02-22 DIAGNOSIS — I739 Peripheral vascular disease, unspecified: Secondary | ICD-10-CM | POA: Diagnosis not present

## 2019-02-22 DIAGNOSIS — I480 Paroxysmal atrial fibrillation: Secondary | ICD-10-CM

## 2019-02-22 DIAGNOSIS — R001 Bradycardia, unspecified: Secondary | ICD-10-CM

## 2019-02-22 DIAGNOSIS — E785 Hyperlipidemia, unspecified: Secondary | ICD-10-CM

## 2019-02-22 DIAGNOSIS — I10 Essential (primary) hypertension: Secondary | ICD-10-CM

## 2019-02-22 DIAGNOSIS — I5032 Chronic diastolic (congestive) heart failure: Secondary | ICD-10-CM

## 2019-02-22 DIAGNOSIS — M79605 Pain in left leg: Secondary | ICD-10-CM

## 2019-02-22 NOTE — Progress Notes (Signed)
Cardiology Office Note    Date:  02/24/2019   ID:  Andre [name], DOB Feb 15, 1923, MRN [id number]  PCP:  [name], MD  Cardiologist:  [name]  Chief Complaint  Patient presents with  . Follow-up    seen for [name].     History of Present Illness:  Andre [name] is a 85 y.o. male with PMH of PAF, diastolic heart failure, HTN, HLD, PAD, and history of macular degeneration.  Echocardiogram in December 2016 showed vigorous LV function, mild LAE and mild MR.  Patient underwent TEE DCCV in April 2017, however the DCCV portion was canceled due to finding of left atrial appendage thrombus on the TEE.  TEE was repeated in June 2017 which showed normal LV function, mild MR, mild to moderate LAE but no thrombus, he subsequently underwent successful cardioversion.  ABI in July 2019 showed a normal pressure on the right and moderate left lower extremity disease.  Patient has been followed by Dr. Fletcher Anon for management of peripheral arterial disease.  This is currently managed medically.  Based on the recent phone note, he has been complaining of worsening leg pain and shortness of breath after ambulation.  Patient presents today with multiple complaints.  He continued to complain of worsening leg pain especially with ambulation.  He also complains of shortness of breath and fatigue with exertion as well.  On physical exam he is bradycardic.  EKG showed 2-1 AV block with heart rate in the 40s.  I discussed the case with Dr. Percival Spanish and also over the phone with Dr. Lovena Le.  We will set up a urgent referral to EP service.  He is not on any AV nodal blocking agent.  He denies any dizziness, blurred vision or feeling of passing out.  Past Medical History:  Diagnosis Date  . Adenomatous colon polyp 04/1985   no further colonoscopy, 2008-last colonoscopy, no polyps    . Atrial fibrillation ([place])   . Diverticulosis   . History of skin cancer    dermatology every 6 months Dr. [name]  . Hyperlipidemia   . Hypertension   . Macular degeneration    bilateral  . Nephrolithiasis   . PAF (paroxysmal atrial fibrillation) ([place])   . PAT (paroxysmal atrial tachycardia) ([facility])    many years ago, worse with smoking    Past Surgical History:  Procedure Laterality Date  . CARDIOVERSION N/A 05/23/2016   Procedure: CARDIOVERSION;  Surgeon: [name], MD;  [facility];  Service: Cardiovascular;  Laterality: N/A;  . CATARACT EXTRACTION     bilateral  . INGUINAL HERNIA REPAIR    . TEE WITHOUT CARDIOVERSION N/A 03/22/2016   Procedure: TRANSESOPHAGEAL ECHOCARDIOGRAM (TEE);  Surgeon: [name], MD;  Location: [facility];  Service: Cardiovascular;  Laterality: N/A;  . TEE WITHOUT CARDIOVERSION N/A 05/23/2016   Procedure: TRANSESOPHAGEAL ECHOCARDIOGRAM (TEE);  Surgeon: [name], MD;  Location: [facility] ENDOSCOPY;  Service: Cardiovascular;  Laterality: N/A;  . TENDON REPAIR  12/11   right leg    Current Medications: No facility-administered medications prior to visit.    Outpatient Medications Prior to Visit  Medication Sig Dispense Refill  . amLODipine (NORVASC) 10 MG tablet Take 1 tablet (10 mg total) by mouth daily. 90 tablet 3  . atorvastatin (LIPITOR) 10 MG tablet Take 1 tablet (10 mg total) by mouth daily. 90 tablet 3  . losartan-hydrochlorothiazide (HYZAAR) 100-25 MG tablet Take 1 tablet by mouth daily. 90 tablet 3  . Multiple  Vitamin (MULTIVITAMIN WITH MINERALS) TABS tablet Take 1 tablet by mouth daily. ABC Plus Senior    . Rivaroxaban (XARELTO) 15 MG TABS tablet Take 1 tablet (15 mg total) by mouth daily with supper. 90 tablet 3     Allergies:   Patient has no known allergies.   Social History   Socioeconomic History  . Marital status: Married    Spouse name: Not on file  . Number of children: 5  . Years of education: Not on file  . Highest education level: Not on file  Occupational History  . Occupation: RETIRED    Employer: RETIRED   Social Needs  . Financial resource strain: Not on file  . Food insecurity:    Worry: Not on file    Inability: Not on file  . Transportation needs:    Medical: Not on file    Non-medical: Not on file  Tobacco Use  . Smoking status: Former Smoker    Packs/day: 1.00    Years: 60.00    Pack years: 60.00    Types: Cigarettes    Last attempt to quit: 12/13/2003    Years since quitting: 15.2  . Smokeless tobacco: Never Used  Substance and Sexual Activity  . Alcohol use: Yes    Alcohol/week: 0.0 standard drinks    Comment: 2 glass of wine a day  . Drug use: No  . Sexual activity: Not on file  Lifestyle  . Physical activity:    Days per week: Not on file    Minutes per session: Not on file  . Stress: Not on file  Relationships  . Social connections:    Talks on phone: Not on file    Gets together: Not on file    Attends religious service: Not on file    Active member of club or organization: Not on file    Attends meetings of clubs or organizations: Not on file    Relationship status: Not on file  Other Topics Concern  . Not on file  Social History Narrative   Married (64 years in 08/2015-goes to outside practice). 5 children. 8 grandchildren (lost 1 grandchild #9 to [place] 18,  Lost #9 to seizures)      Retired at [employer], high Cabin crew      Hobbies: time with family, go to beach (44 years in a row), travel     Family History:  The patient's family history includes Cancer in his father; Hypertension in his mother.   ROS:   Please see the history of present illness.    ROS All other systems reviewed and are negative.   PHYSICAL EXAM:   VS:  BP (!) 142/66 (BP Location: Left Arm)   Pulse (!) 43   Ht 5\' 5"  (1.651 m)   Wt 160 lb 3.2 oz (72.7 kg)   SpO2 93%   BMI 26.66 kg/m    GEN: Well nourished, well developed, in no acute distress  HEENT: normal  Neck: no JVD, carotid bruits, or masses Cardiac: RRR; no murmurs, rubs, or gallops,no edema  Respiratory:   clear to auscultation bilaterally, normal work of breathing GI: soft, nontender, nondistended, + BS MS: no deformity or atrophy  Skin: warm and dry, no rash Neuro:  Alert and Oriented x 3, Strength and sensation are intact Psych: euthymic mood, full affect  Wt Readings from Last 3 Encounters:  02/24/19 160 lb 15 oz (73 kg)  02/22/19 160 lb 3.2 oz (72.7 kg)  01/29/19 155 lb 9.6  oz (70.6 kg)      Studies/Labs Reviewed:   EKG:  EKG is ordered today.  The ekg ordered today demonstrates 2-1 AV block, heart rate of 43  Recent Labs: 02/23/2019: B Natriuretic Peptide 1,628.2; Hemoglobin 12.6; Magnesium 2.4; Platelets 256 02/24/2019: BUN 64; Creatinine, Ser 2.32; Potassium 3.8; Sodium 131   Lipid Panel    Component Value Date/Time   CHOL 167 01/19/2018 1010   TRIG 102 01/19/2018 1010   TRIG 166 (H) 11/29/2006 1220   HDL 63 01/19/2018 1010   CHOLHDL 2.7 01/19/2018 1010   CHOLHDL 3 02/17/2015 0902   VLDL 29.0 02/17/2015 0902   LDLCALC 84 01/19/2018 1010    Additional studies/ records that were reviewed today include:   TEE 05/23/2016 LV EF: 55% -   60%  ------------------------------------------------------------------- Indications:      Atrial fibrillation - 427.31.  ------------------------------------------------------------------- History:   PMH:   Atrial fibrillation.  ------------------------------------------------------------------- Study Conclusions  - Left ventricle: The cavity size was normal. Wall thickness was   normal. Systolic function was normal. The estimated ejection   fraction was in the range of 55% to 60%. Wall motion was normal;   there were no regional wall motion abnormalities. No evidence of   thrombus. - Mitral valve: There was mild regurgitation directed centrally. - Left atrium: The atrium was mildly to moderately dilated. No   evidence of thrombus in the atrial cavity or appendage. There was   mildintermittent spontaneous echo contrast  (&quot;smoke&quot;) in the   appendage. Emptying velocity was reduced. - Tricuspid valve: There was mild-moderate regurgitation directed   centrally.  Impressions:  - Successful cardioversion. No cardiac source of emboli was   indentified.  ASSESSMENT:    1. Bradycardia   2. Chronic diastolic heart failure ([place])   3. Murmur, heart   4. PAD (peripheral artery disease) ([place])   5. Leg pain, bilateral   6. PAF (paroxysmal atrial fibrillation) ([place])   7. Essential hypertension   8. Hyperlipidemia LDL goal <70      PLAN:  In order of problems listed above:  1. Bradycardia: EKG today demonstrated 2-1 heart block.  He does not have any dizziness, blurred vision or feeling passing out.  I reviewed it with DOD Dr. Percival Spanish and discussed the case with Dr. Lovena Le over the phone.  We will make a urgent referral to EP  2. Leg pain: Repeat lower extremity angiography  3. Heart murmur: Obtain echocardiogram  4. Chronic diastolic heart failure: Euvolemic on physical exam  5. PAF: Not on any AV nodal blocking agent.  6. Hypertension: Blood pressure elevated today, however given bradycardia, I recommend keep his blood pressure borderline high.  7. Hyperlipidemia: On Lipitor    Medication Adjustments/Labs and Tests Ordered: Current medicines are reviewed at length with the patient today.  Concerns regarding medicines are outlined above.  Medication changes, Labs and Tests ordered today are listed in the Patient Instructions below. Patient Instructions  Medication Instructions:  Your physician recommends that you continue on your current medications as directed. Please refer to the Current Medication list given to you today.  If you need a refill on your cardiac medications before your next appointment, please call your pharmacy.   Lab work: CMET TSH CBC  If you have labs (blood work) drawn today and your tests are completely normal, you will receive your results only by: Marland Kitchen  MyChart Message (if you have MyChart) OR . A paper copy in the mail If you have any lab  test that is abnormal or we need to change your treatment, we will call you to review the results.  Testing/Procedures: Your physician has requested that you have a lower or upper extremity arterial duplex. This test is an ultrasound of the arteries in the legs or arms. It looks at arterial blood flow in the legs and arms. Allow one hour for Lower and Upper Arterial scans. There are no restrictions or special instructions  Your physician has requested that you have an echocardiogram. Echocardiography is a painless test that uses sound waves to create images of your heart. It provides your doctor with information about the size and shape of your heart and how well your heart's chambers and valves are working. This procedure takes approximately one hour. There are no restrictions for this procedure.  Your physician has requested that you have an echocardiogram. Echocardiography is a painless test that uses sound waves to create images of your heart. It provides your doctor with information about the size and shape of your heart and how well your heart's chambers and valves are working. This procedure takes approximately one hour. There are no restrictions for this procedure.  Your physician has requested that you have an ankle brachial index (ABI). During this test an ultrasound and blood pressure cuff are used to evaluate the arteries that supply the arms and legs with blood. Allow thirty minutes for this exam. There are no restrictions or special instructions.  Follow-Up: . Your physician recommends that you schedule a follow-up appointment with Dr. Lovena Le at our [facility] office    Any Other Special Instructions Will Be Listed Below (If Applicable).       Hilbert Corrigan, [place]  02/24/2019 11:35 PM    [facility] [address], [place], [place]  [address] Phone: [phone];  Fax: [phone]

## 2019-02-22 NOTE — Patient Instructions (Addendum)
Medication Instructions:  Your physician recommends that you continue on your current medications as directed. Please refer to the Current Medication list given to you today.  If you need a refill on your cardiac medications before your next appointment, please call your pharmacy.   Lab work: CMET TSH CBC  If you have labs (blood work) drawn today and your tests are completely normal, you will receive your results only by: Marland Kitchen MyChart Message (if you have MyChart) OR . A paper copy in the mail If you have any lab test that is abnormal or we need to change your treatment, we will call you to review the results.  Testing/Procedures: Your physician has requested that you have a lower or upper extremity arterial duplex. This test is an ultrasound of the arteries in the legs or arms. It looks at arterial blood flow in the legs and arms. Allow one hour for Lower and Upper Arterial scans. There are no restrictions or special instructions  Your physician has requested that you have an echocardiogram. Echocardiography is a painless test that uses sound waves to create images of your heart. It provides your doctor with information about the size and shape of your heart and how well your heart's chambers and valves are working. This procedure takes approximately one hour. There are no restrictions for this procedure.  Your physician has requested that you have an echocardiogram. Echocardiography is a painless test that uses sound waves to create images of your heart. It provides your doctor with information about the size and shape of your heart and how well your heart's chambers and valves are working. This procedure takes approximately one hour. There are no restrictions for this procedure.  Your physician has requested that you have an ankle brachial index (ABI). During this test an ultrasound and blood pressure cuff are used to evaluate the arteries that supply the arms and legs with blood. Allow thirty  minutes for this exam. There are no restrictions or special instructions.  Follow-Up: . Your physician recommends that you schedule a follow-up appointment with Dr. Lovena Le at our [facility] office    Any Other Special Instructions Will Be Listed Below (If Applicable).

## 2019-02-23 ENCOUNTER — Inpatient Hospital Stay (HOSPITAL_COMMUNITY)
Admission: EM | Admit: 2019-02-23 | Discharge: 2019-03-12 | DRG: 242 | Disposition: A | Discharge status: Skilled Nursing Facility | Payer: Medicare Other | Attending: Cardiology | Admitting: Cardiology

## 2019-02-23 ENCOUNTER — Other Ambulatory Visit: Payer: Self-pay

## 2019-02-23 ENCOUNTER — Encounter (HOSPITAL_COMMUNITY): Payer: Self-pay | Admitting: Emergency Medicine

## 2019-02-23 ENCOUNTER — Emergency Department (HOSPITAL_COMMUNITY): Payer: Medicare Other

## 2019-02-23 ENCOUNTER — Other Ambulatory Visit (HOSPITAL_COMMUNITY): Payer: Medicare Other

## 2019-02-23 DIAGNOSIS — I739 Peripheral vascular disease, unspecified: Secondary | ICD-10-CM | POA: Diagnosis present

## 2019-02-23 DIAGNOSIS — N189 Chronic kidney disease, unspecified: Secondary | ICD-10-CM

## 2019-02-23 DIAGNOSIS — J449 Chronic obstructive pulmonary disease, unspecified: Secondary | ICD-10-CM | POA: Diagnosis present

## 2019-02-23 DIAGNOSIS — I483 Typical atrial flutter: Secondary | ICD-10-CM | POA: Diagnosis not present

## 2019-02-23 DIAGNOSIS — Z85828 Personal history of other malignant neoplasm of skin: Secondary | ICD-10-CM | POA: Diagnosis not present

## 2019-02-23 DIAGNOSIS — R001 Bradycardia, unspecified: Secondary | ICD-10-CM

## 2019-02-23 DIAGNOSIS — Z87442 Personal history of urinary calculi: Secondary | ICD-10-CM | POA: Diagnosis not present

## 2019-02-23 DIAGNOSIS — Z87891 Personal history of nicotine dependence: Secondary | ICD-10-CM | POA: Diagnosis not present

## 2019-02-23 DIAGNOSIS — I509 Heart failure, unspecified: Secondary | ICD-10-CM

## 2019-02-23 DIAGNOSIS — I452 Bifascicular block: Secondary | ICD-10-CM | POA: Diagnosis present

## 2019-02-23 DIAGNOSIS — I130 Hypertensive heart and chronic kidney disease with heart failure and stage 1 through stage 4 chronic kidney disease, or unspecified chronic kidney disease: Secondary | ICD-10-CM | POA: Diagnosis present

## 2019-02-23 DIAGNOSIS — Z79899 Other long term (current) drug therapy: Secondary | ICD-10-CM | POA: Diagnosis not present

## 2019-02-23 DIAGNOSIS — I5043 Acute on chronic combined systolic (congestive) and diastolic (congestive) heart failure: Secondary | ICD-10-CM | POA: Diagnosis not present

## 2019-02-23 DIAGNOSIS — I4819 Other persistent atrial fibrillation: Secondary | ICD-10-CM | POA: Diagnosis present

## 2019-02-23 DIAGNOSIS — Z86010 Personal history of colonic polyps: Secondary | ICD-10-CM

## 2019-02-23 DIAGNOSIS — I5033 Acute on chronic diastolic (congestive) heart failure: Secondary | ICD-10-CM | POA: Diagnosis present

## 2019-02-23 DIAGNOSIS — N182 Chronic kidney disease, stage 2 (mild): Secondary | ICD-10-CM | POA: Diagnosis not present

## 2019-02-23 DIAGNOSIS — H3530 Unspecified macular degeneration: Secondary | ICD-10-CM | POA: Diagnosis present

## 2019-02-23 DIAGNOSIS — Z9981 Dependence on supplemental oxygen: Secondary | ICD-10-CM

## 2019-02-23 DIAGNOSIS — N179 Acute kidney failure, unspecified: Secondary | ICD-10-CM | POA: Diagnosis present

## 2019-02-23 DIAGNOSIS — Z7901 Long term (current) use of anticoagulants: Secondary | ICD-10-CM

## 2019-02-23 DIAGNOSIS — E785 Hyperlipidemia, unspecified: Secondary | ICD-10-CM | POA: Diagnosis present

## 2019-02-23 DIAGNOSIS — I361 Nonrheumatic tricuspid (valve) insufficiency: Secondary | ICD-10-CM | POA: Diagnosis not present

## 2019-02-23 DIAGNOSIS — I4891 Unspecified atrial fibrillation: Secondary | ICD-10-CM | POA: Diagnosis present

## 2019-02-23 DIAGNOSIS — R0602 Shortness of breath: Secondary | ICD-10-CM | POA: Diagnosis not present

## 2019-02-23 DIAGNOSIS — Z8249 Family history of ischemic heart disease and other diseases of the circulatory system: Secondary | ICD-10-CM

## 2019-02-23 DIAGNOSIS — R042 Hemoptysis: Secondary | ICD-10-CM | POA: Diagnosis not present

## 2019-02-23 DIAGNOSIS — I442 Atrioventricular block, complete: Secondary | ICD-10-CM | POA: Diagnosis present

## 2019-02-23 DIAGNOSIS — E876 Hypokalemia: Secondary | ICD-10-CM | POA: Diagnosis not present

## 2019-02-23 DIAGNOSIS — N183 Chronic kidney disease, stage 3 (moderate): Secondary | ICD-10-CM | POA: Diagnosis present

## 2019-02-23 DIAGNOSIS — I4892 Unspecified atrial flutter: Secondary | ICD-10-CM | POA: Diagnosis present

## 2019-02-23 DIAGNOSIS — I480 Paroxysmal atrial fibrillation: Secondary | ICD-10-CM | POA: Diagnosis not present

## 2019-02-23 DIAGNOSIS — R0902 Hypoxemia: Secondary | ICD-10-CM | POA: Diagnosis present

## 2019-02-23 DIAGNOSIS — Z950 Presence of cardiac pacemaker: Secondary | ICD-10-CM

## 2019-02-23 DIAGNOSIS — I10 Essential (primary) hypertension: Secondary | ICD-10-CM | POA: Diagnosis present

## 2019-02-23 LAB — CBC WITH DIFFERENTIAL/PLATELET
Abs Immature Granulocytes: 0.08 10*3/uL — ABNORMAL HIGH (ref 0.00–0.07)
BASOS ABS: 0.1 10*3/uL (ref 0.0–0.1)
Basophils Relative: 0 %
EOS PCT: 0 %
Eosinophils Absolute: 0.1 10*3/uL (ref 0.0–0.5)
HEMATOCRIT: 36.6 % — AB (ref 39.0–52.0)
HEMOGLOBIN: 12.6 g/dL — AB (ref 13.0–17.0)
Immature Granulocytes: 1 %
LYMPHS ABS: 1.3 10*3/uL (ref 0.7–4.0)
LYMPHS PCT: 9 %
MCH: 32.3 pg (ref 26.0–34.0)
MCHC: 34.4 g/dL (ref 30.0–36.0)
MCV: 93.8 fL (ref 80.0–100.0)
MONO ABS: 0.9 10*3/uL (ref 0.1–1.0)
Monocytes Relative: 6 %
Neutro Abs: 12.3 10*3/uL — ABNORMAL HIGH (ref 1.7–7.7)
Neutrophils Relative %: 84 %
Platelets: 256 10*3/uL (ref 150–400)
RBC: 3.9 MIL/uL — ABNORMAL LOW (ref 4.22–5.81)
RDW: 13.1 % (ref 11.5–15.5)
WBC: 14.7 10*3/uL — ABNORMAL HIGH (ref 4.0–10.5)
nRBC: 0 % (ref 0.0–0.2)

## 2019-02-23 LAB — BASIC METABOLIC PANEL
Anion gap: 11 (ref 5–15)
BUN: 61 mg/dL — ABNORMAL HIGH (ref 8–23)
CHLORIDE: 100 mmol/L (ref 98–111)
CO2: 21 mmol/L — AB (ref 22–32)
CREATININE: 2.29 mg/dL — AB (ref 0.61–1.24)
Calcium: 8.8 mg/dL — ABNORMAL LOW (ref 8.9–10.3)
GFR calc non Af Amer: 24 mL/min — ABNORMAL LOW (ref >60)
GFR, EST AFRICAN AMERICAN: 27 mL/min — AB (ref >60)
Glucose, Bld: 129 mg/dL — ABNORMAL HIGH (ref 70–99)
Potassium: 4.2 mmol/L (ref 3.5–5.1)
Sodium: 132 mmol/L — ABNORMAL LOW (ref 135–145)

## 2019-02-23 LAB — BRAIN NATRIURETIC PEPTIDE: B Natriuretic Peptide: 1628.2 pg/mL — ABNORMAL HIGH (ref 0.0–100.0)

## 2019-02-23 LAB — MAGNESIUM: Magnesium: 2.4 mg/dL (ref 1.7–2.4)

## 2019-02-23 LAB — PROTIME-INR
INR: 1.8 — ABNORMAL HIGH (ref 0.8–1.2)
PROTHROMBIN TIME: 20.4 s — AB (ref 11.4–15.2)

## 2019-02-23 MED ORDER — CHLORHEXIDINE GLUCONATE 4 % EX LIQD
60.0000 mL | Freq: Once | CUTANEOUS | Status: AC
  Administered 2019-02-23: 4 via TOPICAL

## 2019-02-23 MED ORDER — CHLORHEXIDINE GLUCONATE 4 % EX LIQD
60.0000 mL | Freq: Once | CUTANEOUS | Status: AC
  Administered 2019-02-24: 4 via TOPICAL
  Filled 2019-02-23: qty 15

## 2019-02-23 MED ORDER — ONDANSETRON HCL 4 MG/2ML IJ SOLN: 4.0000 mg | Freq: Four times a day (QID) | INTRAMUSCULAR | Status: DC | PRN

## 2019-02-23 MED ORDER — SODIUM CHLORIDE 0.9 % IV SOLN
INTRAVENOUS | Status: DC
  Administered 2019-02-24: 08:00:00 via INTRAVENOUS

## 2019-02-23 MED ORDER — ACETAMINOPHEN 325 MG PO TABS: 650.0000 mg | ORAL_TABLET | ORAL | Status: DC | PRN

## 2019-02-23 MED ORDER — ATORVASTATIN CALCIUM 10 MG PO TABS
10.0000 mg | ORAL_TABLET | Freq: Every day | ORAL | Status: DC
  Administered 2019-02-25 – 2019-03-12 (×15): 10 mg via ORAL
  Filled 2019-02-23 (×16): qty 1

## 2019-02-23 MED ORDER — SODIUM CHLORIDE 0.9% FLUSH
3.0000 mL | Freq: Two times a day (BID) | INTRAVENOUS | Status: DC
  Administered 2019-02-23 – 2019-03-11 (×21): 3 mL via INTRAVENOUS

## 2019-02-23 MED ORDER — SODIUM CHLORIDE 0.9% FLUSH: 3.0000 mL | INTRAVENOUS | Status: DC | PRN

## 2019-02-23 MED ORDER — CEFAZOLIN SODIUM-DEXTROSE 2-4 GM/100ML-% IV SOLN
2.0000 g | INTRAVENOUS | Status: AC
  Administered 2019-02-24: 2 g via INTRAVENOUS
  Filled 2019-02-23: qty 100

## 2019-02-23 MED ORDER — SODIUM CHLORIDE 0.9 % IV SOLN: INTRAVENOUS | Status: DC

## 2019-02-23 MED ORDER — SODIUM CHLORIDE 0.9 % IV SOLN
250.0000 mL | INTRAVENOUS | Status: DC | PRN
  Administered 2019-03-04: 250 mL via INTRAVENOUS

## 2019-02-23 MED ORDER — FUROSEMIDE 10 MG/ML IJ SOLN
40.0000 mg | Freq: Two times a day (BID) | INTRAMUSCULAR | Status: DC
  Administered 2019-02-23 – 2019-02-24 (×3): 40 mg via INTRAVENOUS
  Filled 2019-02-23 (×3): qty 4

## 2019-02-23 MED ORDER — SODIUM CHLORIDE 0.9 % IV SOLN
80.0000 mg | INTRAVENOUS | Status: AC
  Administered 2019-02-24: 80 mg
  Filled 2019-02-23: qty 2

## 2019-02-23 MED ORDER — AMLODIPINE BESYLATE 10 MG PO TABS
10.0000 mg | ORAL_TABLET | Freq: Every day | ORAL | Status: DC
  Administered 2019-02-25 – 2019-03-12 (×15): 10 mg via ORAL
  Filled 2019-02-23 (×16): qty 1

## 2019-02-23 NOTE — ED Notes (Signed)
ED TO INPATIENT HANDOFF REPORT  ED Nurse Name and Phone #: Velna Hatchet [phone]  S Name/Age/Gender Andre [name] 85 y.o. male Room/Bed: 029C/029C  Code Status   Code Status: Not on file  Home/SNF/Other Home Patient oriented to: aox4 Is this baseline? Yes   Triage Complete: Triage complete  Chief Complaint sob  Triage Note Pt to ER for evaluation of shortness of breath onset yesterday, seen by PCP and discharged home told to return if worsened. Reports did not sleep well. On fire arrival to home O2 sats 86%, was placed on 2L with improvement of oxygen sats and symptoms. Found to be in 3rd degree AV block in the 30-40's. Pt denies chest pain on arrival and vitals are stable. Pt is a/ox4.    Allergies No Known Allergies  Level of Care/Admitting Diagnosis ED Disposition    ED Disposition Condition [facility] Area: [place] [100100]  Level of Care: Progressive [102]  Diagnosis: CHB (complete heart block) [facility]) [160109]  Admitting Physician: Gardiner Barefoot  Attending Physician: Deboraha Sprang [phone]  Estimated length of stay: past midnight tomorrow  Certification:: I certify this patient will need inpatient services for at least 2 midnights  PT Class (Do Not Modify): Inpatient [101]  PT Acc Code (Do Not Modify): Private [1]       B Medical/Surgery History Past Medical History:  Diagnosis Date  . Adenomatous colon polyp 04/1985   no further colonoscopy, 2008-last colonoscopy, no polyps    . Atrial fibrillation ([place])   . Diverticulosis   . History of skin cancer    dermatology every 6 months Dr. [name]  . Hyperlipidemia   . Hypertension   . Macular degeneration    bilateral  . Nephrolithiasis   . PAF (paroxysmal atrial fibrillation) ([place])   . PAT (paroxysmal atrial tachycardia) ([facility])    many years ago, worse with smoking   Past Surgical History:  Procedure Laterality Date  . CARDIOVERSION N/A 05/23/2016   Procedure: CARDIOVERSION;  Surgeon: [name], MD;  [facility];  Service: Cardiovascular;  Laterality: N/A;  . CATARACT EXTRACTION     bilateral  . INGUINAL HERNIA REPAIR    . TEE WITHOUT CARDIOVERSION N/A 03/22/2016   Procedure: TRANSESOPHAGEAL ECHOCARDIOGRAM (TEE);  Surgeon: [name], MD;  Location: [place];  Service: Cardiovascular;  Laterality: N/A;  . TEE WITHOUT CARDIOVERSION N/A 05/23/2016   Procedure: TRANSESOPHAGEAL ECHOCARDIOGRAM (TEE);  Surgeon: [name], MD;  Location: [place];  Service: Cardiovascular;  Laterality: N/A;  . TENDON REPAIR  12/11   right leg     A IV Location/Drains/Wounds Patient Lines/Drains/Airways Status   Active Line/Drains/Airways    Name:   Placement date:   Placement time:   Site:   Days:   Peripheral IV 02/23/19 Left Antecubital   02/23/19    0835    Antecubital   less than 1          Intake/Output Last 24 hours No intake or output data in the 24 hours ending 02/23/19 1127  Labs/Imaging Results for orders placed or performed during the hospital encounter of 02/23/19 (from the past 48 hour(s))  Basic metabolic panel     Status: Abnormal   Collection Time: 02/23/19  8:32 AM  Result Value Ref Range   Sodium 132 (L) 135 - 145 mmol/L   Potassium 4.2 3.5 - 5.1 mmol/L   Chloride 100 98 - 111 mmol/L   CO2 21 (L) 22 -  32 mmol/L   Glucose, Bld 129 (H) 70 - 99 mg/dL   BUN 61 (H) 8 - 23 mg/dL   Creatinine, Ser 2.29 (H) 0.61 - 1.24 mg/dL   Calcium 8.8 (L) 8.9 - 10.3 mg/dL   GFR calc non Af Amer 24 (L) >60 mL/min   GFR calc Af Amer 27 (L) >60 mL/min   Anion gap 11 5 - 15    Comment: Performed at [facility] [address] [place], [place] [address]  CBC with Differential     Status: Abnormal   Collection Time: 02/23/19  8:32 AM  Result Value Ref Range   WBC 14.7 (H) 4.0 - 10.5 K/uL   RBC 3.90 (L) 4.22 - 5.81 MIL/uL   Hemoglobin 12.6 (L) 13.0 - 17.0 g/dL   HCT 36.6 (L) 39.0 - 52.0 %   MCV 93.8 80.0  - 100.0 fL   MCH 32.3 26.0 - 34.0 pg   MCHC 34.4 30.0 - 36.0 g/dL   RDW 13.1 11.5 - 15.5 %   Platelets 256 150 - 400 K/uL   nRBC 0.0 0.0 - 0.2 %   Neutrophils Relative % 84 %   Neutro Abs 12.3 (H) 1.7 - 7.7 K/uL   Lymphocytes Relative 9 %   Lymphs Abs 1.3 0.7 - 4.0 K/uL   Monocytes Relative 6 %   Monocytes Absolute 0.9 0.1 - 1.0 K/uL   Eosinophils Relative 0 %   Eosinophils Absolute 0.1 0.0 - 0.5 K/uL   Basophils Relative 0 %   Basophils Absolute 0.1 0.0 - 0.1 K/uL   Immature Granulocytes 1 %   Abs Immature Granulocytes 0.08 (H) 0.00 - 0.07 K/uL    Comment: Performed at [facility] [address], [place], [place] [address]  Magnesium     Status: None   Collection Time: 02/23/19  8:32 AM  Result Value Ref Range   Magnesium 2.4 1.7 - 2.4 mg/dL    Comment: Performed at [facility] [address], [place], [place] [address]  Protime-INR     Status: Abnormal   Collection Time: 02/23/19  8:32 AM  Result Value Ref Range   Prothrombin Time 20.4 (H) 11.4 - 15.2 seconds   INR 1.8 (H) 0.8 - 1.2    Comment: (NOTE) INR goal varies based on device and disease states. Performed at [facility], [place] [address] [place], [place] [address]   Brain natriuretic peptide     Status: Abnormal   Collection Time: 02/23/19  8:33 AM  Result Value Ref Range   B Natriuretic Peptide 1,628.2 (H) 0.0 - 100.0 pg/mL    Comment: Performed at [facility] [address], [place], [place] [address]   Dg Chest Port 1 View  Result Date: 02/23/2019 CLINICAL DATA:  Shortness of breath EXAM: PORTABLE CHEST 1 VIEW COMPARISON:  12/08/2010 FINDINGS: Mild subpleural reticulation/fibrosis in the lungs bilaterally. Pulmonary vascular congestion/possible mild interstitial edema. Suspected small bilateral pleural effusions. No pneumothorax. The heart is normal in size. IMPRESSION: Suspected mild interstitial edema with small bilateral pleural effusions. Possible underlying chronic interstitial  lung disease. Electronically Signed   By: Julian Hy M.D.   On: 02/23/2019 08:54    Pending Labs [employer] (From admission, onward)    Start     Ordered   Signed and Corporate treasurer  Daily,   R     Signed and Held  Vitals/Pain Today's Vitals   02/23/19 0915 02/23/19 0930 02/23/19 0945 02/23/19 1000  BP: (!) 161/49 (!) 151/92 (!) 160/44   Pulse: (!) 36 (!) 36 (!) 35   Resp: 14 20 18    SpO2: 95% 95% 96%   PainSc:    0-No pain    Isolation Precautions No active isolations  Medications Medications  furosemide (LASIX) injection 40 mg (40 mg Intravenous Given 02/23/19 1118)    Mobility walks with device High fall risk   Focused Assessments Cardiac Assessment Handoff:  Cardiac Rhythm: Heart block No results found for: CKTOTAL, CKMB, CKMBINDEX, TROPONINI No results found for: DDIMER Does the Patient currently have chest pain? No      R Recommendations: See Admitting Provider Note  Report given to:   Additional Notes:

## 2019-02-23 NOTE — H&P (Addendum)
History & Physical    Patient ID: Andre [name] MRN: [id number], DOB/AGE: 1923/01/12   Admit date: 02/23/2019  Primary Care Provider: [name], MD Primary Cardiologist: Kirk Ruths, MD  Primary Electrophysiologist: New - Dr. Caryl Comes  Chief Complaint: Complete Heart Block  Patient Profile    Andre [name] is a 85 y.o. male with past medical history of paroxysmal atrial fibrillation (s/p DCCV in 2017,on Xarelto), chronic diastolic CHF (EF 73-22% by TEE in 05/2016), HTN, HLD, and PAD (known aortoiliac disease with medical therapy recommended) who presented to [facility] ED this morning for evaluation of worsening dyspnea. Initial EKG showing CHB.   History of Present Illness    Andre [name] was examined by Almyra Deforest yesterday and reported worsening leg pain along with dyspnea and fatigue. Was bradycardiac on examination and EKG showed 2-1 AV Block and this was reviewed with Dr. Lovena Le by phone who recommended urgent EP referral. EKG from yesterday has not yet been scanned into the system.   Presented to the ED this morning for worsening symptoms and EKG is consistent with CHB. Labs show WBC 14.7, Hgb 12.6, platelets 256, Na+ 132, K+ 4.2, and creatinine 2.29 (baseline 1.3 - 1.4). BNP 1628. Mg 2.4. CXR shows mild interstitial edema with small bilateral pleural effusions and possible underlying lung disease.   In talking with the patient and his daughter today he reports progressive dyspnea and fatigue over the past week. Says he typically walks 0.5 miles per day without any symptoms but noticed worsening dyspnea for the past week. Overnight and this morning, his dyspnea acutely worsened which prompted him to call his daughter. Denies any recent chest pain, palpitations, orthopnea, or PND. Does experience intermittent lower extremity edema at home. Does report mild dizziness but no recent syncopal episodes.  He is on Amlodipine, Atorvastatin, Losartan-HCTZ, and Xarelto  as an outpatient. No AV nodal blocking agents.   Past Medical History:  Diagnosis Date  . Adenomatous colon polyp 04/1985   no further colonoscopy, 2008-last colonoscopy, no polyps    . Atrial fibrillation ([place])   . Diverticulosis   . History of skin cancer    dermatology every 6 months Dr. [name]  . Hyperlipidemia   . Hypertension   . Macular degeneration    bilateral  . Nephrolithiasis   . PAF (paroxysmal atrial fibrillation) ([place])   . PAT (paroxysmal atrial tachycardia) ([facility])    many years ago, worse with smoking    Past Surgical History:  Procedure Laterality Date  . CARDIOVERSION N/A 05/23/2016   Procedure: CARDIOVERSION;  Surgeon: Sanda , MD;  [facility];  Service: Cardiovascular;  Laterality: N/A;  . CATARACT EXTRACTION     bilateral  . INGUINAL HERNIA REPAIR    . TEE WITHOUT CARDIOVERSION N/A 03/22/2016   Procedure: TRANSESOPHAGEAL ECHOCARDIOGRAM (TEE);  Surgeon: [name], MD;  Location: [place];  Service: Cardiovascular;  Laterality: N/A;  . TEE WITHOUT CARDIOVERSION N/A 05/23/2016   Procedure: TRANSESOPHAGEAL ECHOCARDIOGRAM (TEE);  Surgeon: Sanda , MD;  Location: [facility] ENDOSCOPY;  Service: Cardiovascular;  Laterality: N/A;  . TENDON REPAIR  12/11   right leg     Medications Prior to Admission: Prior to Admission medications   Medication Sig Start Date End Date Taking? Authorizing Provider  amLODipine (NORVASC) 10 MG tablet Take 1 tablet (10 mg total) by mouth daily. 09/03/18  Yes [name], MD  atorvastatin (LIPITOR) 10 MG tablet Take 1 tablet (10 mg total) by mouth  daily. 09/03/18  Yes [name], MD  losartan-hydrochlorothiazide (HYZAAR) 100-25 MG tablet Take 1 tablet by mouth daily. 09/03/18  Yes [name], MD  Multiple Vitamin (MULTIVITAMIN WITH MINERALS) TABS tablet Take 1 tablet by mouth daily. ABC Plus Senior   Yes [provider]  Rivaroxaban (XARELTO) 15 MG TABS tablet Take 1 tablet (15 mg  total) by mouth daily with supper. 09/03/18  Yes [name], MD     Allergies:   No Known Allergies  Social History:   Social History   Socioeconomic History  . Marital status: Married    Spouse name: Not on file  . Number of children: 5  . Years of education: Not on file  . Highest education level: Not on file  Occupational History  . Occupation: RETIRED    Employer: RETIRED  Social Needs  . Financial resource strain: Not on file  . Food insecurity:    Worry: Not on file    Inability: Not on file  . Transportation needs:    Medical: Not on file    Non-medical: Not on file  Tobacco Use  . Smoking status: Former Smoker    Packs/day: 1.00    Years: 60.00    Pack years: 60.00    Types: Cigarettes    Last attempt to quit: 12/13/2003    Years since quitting: 15.2  . Smokeless tobacco: Never Used  Substance and Sexual Activity  . Alcohol use: Yes    Alcohol/week: 0.0 standard drinks    Comment: 2 glass of wine a day  . Drug use: No  . Sexual activity: Not on file  Lifestyle  . Physical activity:    Days per week: Not on file    Minutes per session: Not on file  . Stress: Not on file  Relationships  . Social connections:    Talks on phone: Not on file    Gets together: Not on file    Attends religious service: Not on file    Active member of club or organization: Not on file    Attends meetings of clubs or organizations: Not on file    Relationship status: Not on file  . Intimate partner violence:    Fear of current or ex partner: Not on file    Emotionally abused: Not on file    Physically abused: Not on file    Forced sexual activity: Not on file  Other Topics Concern  . Not on file  Social History Narrative   Married (64 years in 08/2015-goes to outside practice). 5 children. 8 grandchildren (lost 1 grandchild #9 to [place] 18,  Lost #9 to seizures)      Retired at [employer], high Cabin crew      Hobbies: time with family, go to beach (51 years  in a row), travel      Family History:   The patient's family history includes Cancer in his father; Hypertension in his mother.     Review of Systems    General:  No chills, fever, night sweats or weight changes.  Cardiovascular:  No chest pain, edema, palpitations, paroxysmal nocturnal dyspnea. Positive for dyspnea on exertion and fatigue.  Dermatological: No rash, lesions/masses Respiratory: No cough, dyspnea Urologic: No hematuria, dysuria Abdominal:   No nausea, vomiting, diarrhea, bright red blood per rectum, melena, or hematemesis Neurologic:  No visual changes, wkns, changes in mental status. All other systems reviewed and are otherwise negative except as noted above.  Physical Exam  Vitals:   02/23/19 0900 02/23/19 0915 02/23/19 0930 02/23/19 0945  BP: (!) 174/43 (!) 161/49 (!) 151/92 (!) 160/44  Pulse: (!) 36 (!) 36 (!) 36 (!) 35  Resp: 19 14 20 18   SpO2: 95% 95% 95% 96%   No intake or output data in the 24 hours ending 02/23/19 1102 There were no vitals filed for this visit. There is no height or weight on file to calculate BMI.   General: Well developed, elderly Caucasian male appearing in no acute distress. Head: Normocephalic, atraumatic, sclera non-icteric, no xanthomas, nares are without discharge. Dentition:  Neck: No carotid bruits. JVD not elevated.  Lungs: Respirations regular and unlabored, decreased along bases bilaterally Heart: Regular rhythm, bradycardiac rate. No S3 or S4.  No murmur, no rubs, or gallops appreciated. Abdomen: Soft, non-tender, non-distended with normoactive bowel sounds. No hepatomegaly. No rebound/guarding. No obvious abdominal masses. Msk:  Strength and tone appear normal for age. No joint deformities or effusions. Extremities: No clubbing or cyanosis. No edema.  Distal pedal pulses are 2+ bilaterally. Neuro: Alert and oriented X 3. Moves all extremities spontaneously. No focal deficits noted. Psych:  Responds to questions  appropriately with a normal affect. Skin: No rashes or lesions noted  Labs and Radiology Studies    EKG:  The ECG that was done was personally reviewed and demonstrates CHB with known RBBB and LAFB.   Relevant CV Studies:  TEE: 05/2016 Study Conclusions  - Left ventricle: The cavity size was normal. Wall thickness was   normal. Systolic function was normal. The estimated ejection   fraction was in the range of 55% to 60%. Wall motion was normal;   there were no regional wall motion abnormalities. No evidence of   thrombus. - Mitral valve: There was mild regurgitation directed centrally. - Left atrium: The atrium was mildly to moderately dilated. No   evidence of thrombus in the atrial cavity or appendage. There was   mildintermittent spontaneous echo contrast (&quot;smoke&quot;) in the   appendage. Emptying velocity was reduced. - Tricuspid valve: There was mild-moderate regurgitation directed   centrally.  Impressions:  - Successful cardioversion. No cardiac source of emboli was   indentified. Laboratory Data:  Chemistry Recent Labs  Lab 02/23/19 0832  NA 132*  K 4.2  CL 100  CO2 21*  GLUCOSE 129*  BUN 61*  CREATININE 2.29*  CALCIUM 8.8*  GFRNONAA 24*  GFRAA 27*  ANIONGAP 11    No results for input(s): PROT, ALBUMIN, AST, ALT, ALKPHOS, BILITOT in the last 168 hours. Hematology Recent Labs  Lab 02/23/19 0832  WBC 14.7*  RBC 3.90*  HGB 12.6*  HCT 36.6*  MCV 93.8  MCH 32.3  MCHC 34.4  RDW 13.1  PLT 256   Cardiac EnzymesNo results for input(s): TROPONINI in the last 168 hours. No results for input(s): TROPIPOC in the last 168 hours.  BNP Recent Labs  Lab 02/23/19 0833  BNP 1,628.2*    DDimer No results for input(s): DDIMER in the last 168 hours.  Radiology/Studies:  Dg Chest Port 1 View  Result Date: 02/23/2019 CLINICAL DATA:  Shortness of breath EXAM: PORTABLE CHEST 1 VIEW COMPARISON:  12/08/2010 FINDINGS: Mild subpleural  reticulation/fibrosis in the lungs bilaterally. Pulmonary vascular congestion/possible mild interstitial edema. Suspected small bilateral pleural effusions. No pneumothorax. The heart is normal in size. IMPRESSION: Suspected mild interstitial edema with small bilateral pleural effusions. Possible underlying chronic interstitial lung disease. Electronically Signed   By: Julian Hy M.D.   On: 02/23/2019  08:54    Assessment and Plan:   1. Complete Heart Block - presents with worsening dyspnea on exertion and fatigue for the past week, found to be in CHB. Denies any recent syncope. Not on any AV nodal blocking agents prior to admission.  - reviewed with Dr. Caryl Comes and anticipate PPM placement tomorrow once Xarelto has been held and CHF has improved. NPO after midnight.   2. Acute on Chronic Diastolic CHF Exacerbation - has experienced worsening orthopnea. Unaware of his baseline weight. BNP elevated to 1628 and CXR shows mild interstitial edema with small bilateral pleural effusions and possible underlying lung disease. Had been scheduled for an echocardiogram on Wednesday and will plan to obtain this admission.  - was on Losartan-HCTZ prior to admission. Will hold for now and start IV Lasix 40mg  BID. Follow I&O's along with daily weights. Repeat BMET in AM.   3. Paroxysmal Atrial Fibrillation - s/p DCCV in 2017 with no known recurrence since. He is on Xarelto prior to admission for anticoagulation. Will hold for PPM placement and start SCD's per Dr. Caryl Comes.   4. HLD - continue PTA Atorvastatin 10mg  daily.   5. Acute on Chronic Stage 3 CKD - baseline creatinine 1.3 - 1.4, elevated to 2.29 on admission. Hold PTA ARB. Repeat BMET in AM.    Severity of Illness: The appropriate patient status for this patient is INPATIENT. Inpatient status is judged to be reasonable and necessary in order to provide the required intensity of service to ensure the patient's safety. The patient's presenting  symptoms, physical exam findings, and initial radiographic and laboratory data in the context of their chronic comorbidities is felt to place them at high risk for further clinical deterioration. Furthermore, it is not anticipated that the patient will be medically stable for discharge from the hospital within 2 midnights of admission. The following factors support the patient status of inpatient.   " The patient's presenting symptoms include shortness of breath and fatigue. " The worrisome physical exam findings include bradycardia and decreased lung sounds along bases. " The initial radiographic and laboratory data are worrisome because of CHB by EKG and acute CHF exacerbation by labs and CXR. " The chronic co-morbidities include atrial fibrillation, HTN, and Stage 3 CKD.   * I certify that at the point of admission it is my clinical judgment that the patient will require inpatient hospital care spanning beyond 2 midnights from the point of admission due to high intensity of service, high risk for further deterioration and high frequency of surveillance required.*    For questions or updates, please contact [place] Please consult [email or web address] for contact info under Cardiology/STEMI.   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   Exam notable for blood pressure 165.  JVP 7-8.  Bilateral crackles.  Slow heart rate with a 2/6 murmur.  Abdomen soft no edema.  Positional presents with progressive conduction system disease now with complete heart block and secondary heart failure.  Will need pacing.  We will plan diuresis today and proceeding with device implantation tomorrow.  Reviewed with patient and daughter benefits and risks  We will look at echo just  to assess LV function to guide the number of leads.  The benefits and risks were reviewed including but not limited to death,  perforation, infection, lead dislodgement  and device malfunction.  The patient understands agrees and is willing to proceed.

## 2019-02-23 NOTE — ED Notes (Signed)
Cardiology at bedside.

## 2019-02-23 NOTE — ED Notes (Signed)
Dr. Jens Som at the bedside. Pt okay to receive PO.

## 2019-02-23 NOTE — ED Triage Notes (Signed)
Pt to ER for evaluation of shortness of breath onset yesterday, seen by PCP and discharged home told to return if worsened. Reports did not sleep well. On fire arrival to home O2 sats 86%, was placed on 2L with improvement of oxygen sats and symptoms. Found to be in 3rd degree AV block in the 30-40's. Pt denies chest pain on arrival and vitals are stable. Pt is a/ox4.

## 2019-02-23 NOTE — ED Provider Notes (Signed)
[facility] EMERGENCY DEPARTMENT Provider Note   CSN: [id number] Arrival date & time: 02/23/19  0825    History   Chief Complaint Chief Complaint  Patient presents with  . Bradycardia    HPI Andre [name] is a 85 y.o. male.  He is a history of paroxysmal A. fib and peripheral vascular disease.  He is on rivaroxaban.  It sounds like he has been in sinus for the last few years.  He is complaining of increased shortness of breath and some claudication symptoms of been going on for maybe few weeks but clearly worse since last night.  No chest pain.  No fevers cough nausea vomiting diarrhea or urinary symptoms.  EMS found him with sats at 86% on arrival and given oxygen which improved his symptoms.  Heart rates were in the 30s and 40s during transport and pacer pads were placed.     The history is provided by the patient.  Shortness of Breath  Severity:  Moderate Onset quality:  Gradual Timing:  Intermittent Progression:  Worsening Chronicity:  New Context: activity   Relieved by:  Oxygen Worsened by:  Activity Ineffective treatments:  None tried Associated symptoms: PND   Associated symptoms: no abdominal pain, no chest pain, no cough, no diaphoresis, no fever, no headaches, no neck pain, no rash, no sore throat, no syncope and no vomiting   Risk factors: hx of cancer     Past Medical History:  Diagnosis Date  . Adenomatous colon polyp 04/1985   no further colonoscopy, 2008-last colonoscopy, no polyps    . Atrial fibrillation ([place])   . Diverticulosis   . History of skin cancer    dermatology every 6 months Dr. [name]  . Hyperlipidemia   . Hypertension   . Macular degeneration    bilateral  . Nephrolithiasis   . PAF (paroxysmal atrial fibrillation) ([place])   . PAT (paroxysmal atrial tachycardia) ([facility])    many years ago, worse with smoking    Patient Active Problem List   Diagnosis Date Noted  . Peripheral vascular disease ([place]) 04/21/2016  .  Atrial fibrillation ([place]) 03/17/2016  . Diastolic CHF, chronic ([place]) 01/05/2016  . Macular degeneration 02/17/2015  . CKD (chronic kidney disease), stage III ([place]) 02/17/2015  . Former smoker 02/17/2015  . History of skin cancer   . Malignant neoplasm of prostate ([place]) 01/05/2010  . Hyperlipidemia 12/04/2007  . Essential hypertension 12/04/2007  . Angiodysplasia of intestine with hemorrhage 05/15/2007    Past Surgical History:  Procedure Laterality Date  . CARDIOVERSION N/A 05/23/2016   Procedure: CARDIOVERSION;  Surgeon: [name], MD;  Location: [facility];  Service: Cardiovascular;  Laterality: N/A;  . CATARACT EXTRACTION     bilateral  . INGUINAL HERNIA REPAIR    . TEE WITHOUT CARDIOVERSION N/A 03/22/2016   Procedure: TRANSESOPHAGEAL ECHOCARDIOGRAM (TEE);  Surgeon: [name], MD;  Location: [facility];  Service: Cardiovascular;  Laterality: N/A;  . TEE WITHOUT CARDIOVERSION N/A 05/23/2016   Procedure: TRANSESOPHAGEAL ECHOCARDIOGRAM (TEE);  Surgeon: [name], MD;  Location: [facility] ENDOSCOPY;  Service: Cardiovascular;  Laterality: N/A;  . TENDON REPAIR  12/11   right leg        Home Medications    Prior to Admission medications   Medication Sig Start Date End Date Taking? Authorizing Provider  amLODipine (NORVASC) 10 MG tablet Take 1 tablet (10 mg total) by mouth daily. 09/03/18   [name], MD  atorvastatin (LIPITOR) 10 MG tablet Take 1  tablet (10 mg total) by mouth daily. 09/03/18   [name], MD  losartan-hydrochlorothiazide (HYZAAR) 100-25 MG tablet Take 1 tablet by mouth daily. 09/03/18   [name], MD  Multiple Vitamin (MULTIVITAMIN WITH MINERALS) TABS tablet Take 1 tablet by mouth daily. [facility] Senior    [provider]  Rivaroxaban (XARELTO) 15 MG TABS tablet Take 1 tablet (15 mg total) by mouth daily with supper. 09/03/18   [name], MD    Family History Family History  Problem Relation Age of Onset  .  Hypertension Mother   . Cancer Father        lung    Social History Social History   Tobacco Use  . Smoking status: Former Smoker    Packs/day: 1.00    Years: 60.00    Pack years: 60.00    Types: Cigarettes    Last attempt to quit: 12/13/2003    Years since quitting: 15.2  . Smokeless tobacco: Never Used  Substance Use Topics  . Alcohol use: Yes    Alcohol/week: 0.0 standard drinks    Comment: 2 glass of wine a day  . Drug use: No     Allergies   Patient has no known allergies.   Review of Systems Review of Systems  Constitutional: Negative for diaphoresis and fever.  HENT: Negative for sore throat.   Eyes: Negative for visual disturbance.  Respiratory: Positive for shortness of breath. Negative for cough.   Cardiovascular: Positive for PND. Negative for chest pain and syncope.  Gastrointestinal: Negative for abdominal pain and vomiting.  Genitourinary: Negative for dysuria.  Musculoskeletal: Negative for neck pain.  Skin: Negative for rash.  Neurological: Negative for headaches.     Physical Exam Updated Vital Signs BP (!) 179/42 (BP Location: Right Arm)   Pulse (!) 40   Temp (!) 97.5 F (36.4 C) (Oral)   Resp 19   Ht 5\' 5"  (1.651 m)   Wt 72.7 kg   SpO2 (!) 89%   BMI 26.67 kg/m   Physical Exam Vitals signs and nursing note reviewed.  Constitutional:      Appearance: He is well-developed.  HENT:     Head: Normocephalic and atraumatic.  Eyes:     Conjunctiva/sclera: Conjunctivae normal.  Neck:     Musculoskeletal: Neck supple.  Cardiovascular:     Rate and Rhythm: Regular rhythm. Bradycardia present.     Heart sounds: No murmur.  Pulmonary:     Effort: Pulmonary effort is normal. No respiratory distress.     Breath sounds: Normal breath sounds.  Abdominal:     Palpations: Abdomen is soft.     Tenderness: There is no abdominal tenderness. There is no guarding or rebound.  Musculoskeletal: Normal range of motion.        General: No deformity or  signs of injury.     Right lower leg: Edema (trace bilateral) present.     Left lower leg: Edema present.  Skin:    General: Skin is warm and dry.     Capillary Refill: Capillary refill takes less than 2 seconds.  Neurological:     General: No focal deficit present.     Mental Status: He is alert and oriented to person, place, and time.     Motor: No weakness.      ED Treatments / Results  Labs (all labs ordered are listed, but only abnormal results are displayed) Labs Reviewed  BASIC METABOLIC PANEL - Abnormal; Notable for the  following components:      Result Value   Sodium 132 (*)    CO2 21 (*)    Glucose, Bld 129 (*)    BUN 61 (*)    Creatinine, Ser 2.29 (*)    Calcium 8.8 (*)    GFR calc non Af Amer 24 (*)    GFR calc Af Amer 27 (*)    All other components within normal limits  CBC WITH DIFFERENTIAL/PLATELET - Abnormal; Notable for the following components:   WBC 14.7 (*)    RBC 3.90 (*)    Hemoglobin 12.6 (*)    HCT 36.6 (*)    Neutro Abs 12.3 (*)    Abs Immature Granulocytes 0.08 (*)    All other components within normal limits  PROTIME-INR - Abnormal; Notable for the following components:   Prothrombin Time 20.4 (*)    INR 1.8 (*)    All other components within normal limits  BRAIN NATRIURETIC PEPTIDE - Abnormal; Notable for the following components:   B Natriuretic Peptide 1,628.2 (*)    All other components within normal limits  MAGNESIUM  BASIC METABOLIC PANEL    EKG EKG Interpretation  Date/Time:  Saturday February 23 2019 08:27:14 EDT Ventricular Rate:  40 PR Interval:    QRS Duration: 159 QT Interval:  534 QTC Calculation: 436 R Axis:   -72 Text Interpretation:  Complete AV block with wide QRS complex RBBB and LAFB slower rate from prior 6/17 Confirmed by Aletta Edouard [phone]) on 02/23/2019 8:34:56 AM   Radiology Dg Chest Port 1 View  Result Date: 02/23/2019 CLINICAL DATA:  Shortness of breath EXAM: PORTABLE CHEST 1 VIEW COMPARISON:   12/08/2010 FINDINGS: Mild subpleural reticulation/fibrosis in the lungs bilaterally. Pulmonary vascular congestion/possible mild interstitial edema. Suspected small bilateral pleural effusions. No pneumothorax. The heart is normal in size. IMPRESSION: Suspected mild interstitial edema with small bilateral pleural effusions. Possible underlying chronic interstitial lung disease. Electronically Signed   By: Julian Hy M.D.   On: 02/23/2019 08:54    Procedures .Critical Care Performed by: Hayden Rasmussen, MD Authorized by: Hayden Rasmussen, MD   Critical care provider statement:    Critical care time (minutes):  45   Critical care time was exclusive of:  Separately billable procedures and treating other patients   Critical care was necessary to treat or prevent imminent or life-threatening deterioration of the following conditions:  Cardiac failure   Critical care was time spent personally by me on the following activities:  Discussions with consultants, evaluation of patient's response to treatment, examination of patient, ordering and performing treatments and interventions, ordering and review of laboratory studies, ordering and review of radiographic studies, pulse oximetry, re-evaluation of patient's condition, obtaining history from patient or surrogate, review of old charts and development of treatment plan with patient or surrogate   I assumed direction of critical care for this patient from another provider in my specialty: no     (including critical care time)  Medications Ordered in ED Medications - No data to display   Initial Impression / Assessment and Plan / ED Course  I have reviewed the triage vital signs and the nursing notes.  Pertinent labs & imaging results that were available during my care of the patient were reviewed by me and considered in my medical decision making (see chart for details).  Clinical Course as of Feb 22 1718  Sat Feb 23, 2019  0839 Pacer  pads in place.  Patient alert in no  distress.  Heart rate in high 30s low 40s.  Call placed to cardiology for consult.  Patient's primary cardiologist is Dr. Lovena Le.   [MB]  [phone] Discussed with Dr. Margaretann Loveless from cardiology who will talk to EP.   [MB]  7078 Cardiology down seeing the patient.  Anticipated for admission from possible pacemaker.   [MB]  1023 Patient's lab work concerning for an elevated creatinine from his baseline and an elevated BNP.  These possibly are explained by low flow state with his bradycardia.  We will hold off on diuresing now until discussed with cardiology.   [MB]    Clinical Course User Index [MB] Hayden Rasmussen, MD       Final Clinical Impressions(s) / ED Diagnoses   Final diagnoses:  Complete heart block [facility])  Symptomatic bradycardia    ED Discharge Orders    None       Hayden Rasmussen, MD 02/23/19 1720

## 2019-02-23 NOTE — ED Notes (Signed)
ED Provider at bedside. 

## 2019-02-24 ENCOUNTER — Encounter (HOSPITAL_COMMUNITY)
Admission: EM | Disposition: A | Discharge status: Skilled Nursing Facility | Payer: Self-pay | Source: Home / Self Care | Attending: Internal Medicine

## 2019-02-24 ENCOUNTER — Inpatient Hospital Stay (HOSPITAL_COMMUNITY): Payer: Medicare Other

## 2019-02-24 ENCOUNTER — Other Ambulatory Visit (HOSPITAL_COMMUNITY): Payer: Medicare Other

## 2019-02-24 ENCOUNTER — Encounter: Payer: Self-pay | Admitting: Physician Assistant

## 2019-02-24 DIAGNOSIS — I361 Nonrheumatic tricuspid (valve) insufficiency: Secondary | ICD-10-CM

## 2019-02-24 DIAGNOSIS — I442 Atrioventricular block, complete: Secondary | ICD-10-CM

## 2019-02-24 HISTORY — PX: PACEMAKER IMPLANT: EP1218

## 2019-02-24 LAB — BASIC METABOLIC PANEL
Anion gap: 11 (ref 5–15)
BUN: 64 mg/dL — ABNORMAL HIGH (ref 8–23)
CO2: 19 mmol/L — ABNORMAL LOW (ref 22–32)
Calcium: 8.2 mg/dL — ABNORMAL LOW (ref 8.9–10.3)
Chloride: 101 mmol/L (ref 98–111)
Creatinine, Ser: 2.32 mg/dL — ABNORMAL HIGH (ref 0.61–1.24)
GFR calc Af Amer: 27 mL/min — ABNORMAL LOW (ref >60)
GFR calc non Af Amer: 23 mL/min — ABNORMAL LOW (ref >60)
GLUCOSE: 125 mg/dL — AB (ref 70–99)
Potassium: 3.8 mmol/L (ref 3.5–5.1)
Sodium: 131 mmol/L — ABNORMAL LOW (ref 135–145)

## 2019-02-24 LAB — ECHOCARDIOGRAM COMPLETE
Height: 65 in
Weight: 2574.97 oz

## 2019-02-24 LAB — SURGICAL PCR SCREEN
MRSA, PCR: NEGATIVE
Staphylococcus aureus: NEGATIVE

## 2019-02-24 SURGERY — PACEMAKER IMPLANT
Anesthesia: LOCAL

## 2019-02-24 MED ORDER — ACETAMINOPHEN 325 MG PO TABS: 325.0000 mg | ORAL_TABLET | ORAL | Status: DC | PRN

## 2019-02-24 MED ORDER — HEPARIN (PORCINE) IN NACL 1000-0.9 UT/500ML-% IV SOLN
INTRAVENOUS | Status: DC | PRN
  Administered 2019-02-24: 500 mL

## 2019-02-24 MED ORDER — ONDANSETRON HCL 4 MG/2ML IJ SOLN: 4.0000 mg | Freq: Four times a day (QID) | INTRAMUSCULAR | Status: DC | PRN

## 2019-02-24 MED ORDER — HEPARIN (PORCINE) IN NACL 1000-0.9 UT/500ML-% IV SOLN
INTRAVENOUS | Status: AC
  Filled 2019-02-24: qty 500

## 2019-02-24 MED ORDER — CEFAZOLIN SODIUM-DEXTROSE 2-4 GM/100ML-% IV SOLN
INTRAVENOUS | Status: AC
  Filled 2019-02-24: qty 100

## 2019-02-24 MED ORDER — CEFAZOLIN SODIUM-DEXTROSE 1-4 GM/50ML-% IV SOLN
1.0000 g | Freq: Four times a day (QID) | INTRAVENOUS | Status: AC
  Administered 2019-02-24 – 2019-02-25 (×3): 1 g via INTRAVENOUS
  Filled 2019-02-24 (×4): qty 50

## 2019-02-24 MED ORDER — SODIUM CHLORIDE 0.9 % IV SOLN
INTRAVENOUS | Status: AC
  Filled 2019-02-24: qty 2

## 2019-02-24 MED ORDER — ORAL CARE MOUTH RINSE
15.0000 mL | Freq: Two times a day (BID) | OROMUCOSAL | Status: DC
  Administered 2019-02-24 – 2019-03-12 (×16): 15 mL via OROMUCOSAL

## 2019-02-24 MED ORDER — SODIUM CHLORIDE 0.9 % IV SOLN: INTRAVENOUS | Status: AC

## 2019-02-24 MED ORDER — LIDOCAINE HCL 1 % IJ SOLN
INTRAMUSCULAR | Status: AC
  Filled 2019-02-24: qty 60

## 2019-02-24 MED ORDER — LIDOCAINE HCL (PF) 1 % IJ SOLN
INTRAMUSCULAR | Status: DC | PRN
  Administered 2019-02-24: 50 mL

## 2019-02-24 SURGICAL SUPPLY — 10 items
CABLE SURGICAL S-101-97-12 (CABLE) ×2 IMPLANT
ELECT DEFIB PAD ADLT CADENCE (PAD) ×1 IMPLANT
HEMOSTAT SURGICEL 2X4 FIBR (HEMOSTASIS) ×1 IMPLANT
KIT MICROPUNCTURE NIT STIFF (SHEATH) ×1 IMPLANT
LEAD CAPSURE NOVUS 5076-52CM (Lead) ×1 IMPLANT
LEAD CAPSURE NOVUS 5076-58CM (Lead) ×1 IMPLANT
PACEMAKER ASSURITY DR-RF (Pacemaker) ×1 IMPLANT
PAD PRO RADIOLUCENT 2001M-C (PAD) ×2 IMPLANT
SHEATH CLASSIC 7F (SHEATH) ×2 IMPLANT
TRAY PACEMAKER INSERTION (PACKS) ×2 IMPLANT

## 2019-02-24 NOTE — Progress Notes (Signed)
Progress Note  Patient Name: Andre [name] Date of Encounter: 02/24/2019  Primary Cardiologist: [facility]  Primary Electrophysiologist: sK   Patient Profile     85 y.o. male admitted 3/14 with weakness and progressive conduction disease 2:1 block on Friday and yesterday CHB Previously normal LV function 2017  Subjective   Still sob  Inpatient Medications    Scheduled Meds: . amLODipine  10 mg Oral Daily  . atorvastatin  10 mg Oral Daily  . furosemide  40 mg Intravenous BID  . gentamicin irrigation  80 mg Irrigation On Call  . sodium chloride flush  3 mL Intravenous Q12H   Continuous Infusions: . sodium chloride    . sodium chloride    . sodium chloride    .  ceFAZolin (ANCEF) IV     PRN Meds: sodium chloride, acetaminophen, ondansetron (ZOFRAN) IV, sodium chloride flush   Vital Signs    Vitals:   02/23/19 2021 02/24/19 0002 02/24/19 0424 02/24/19 0722  BP: (!) 166/49 (!) 137/46 (!) 138/48 (!) 132/50  Pulse: (!) 44 (!) 43 (!) 44 (!) 42  Resp: (!) 22 (!) 21 20 (!) 22  Temp: 97.8 F (36.6 C) (!) 97.2 F (36.2 C) 98.4 F (36.9 C)   TempSrc: Oral Oral Oral   SpO2: 90% 93% 90% (!) 88%  Weight:   76.4 kg   Height:        Intake/Output Summary (Last 24 hours) at 02/24/2019 0738 Last data filed at 02/24/2019 4098 Gross per 24 hour  Intake 690 ml  Output 600 ml  Net 90 ml   Filed Weights   02/23/19 1217 02/24/19 0424  Weight: 72.7 kg 76.4 kg    Telemetry    CHB- Personally Reviewed  ECG    na - Personally Reviewed  Physical Exam    GEN: No acute distress.   Neck: JVD lat8-10 Cardiac: RRR, no murmurs, rubs, or gallops.  Respiratory: Crackles bilaterally. GI: Soft, nontender, non-distended  MS: no* edema; No deformity. Neuro:  Nonfocal  Psych: Normal affect  Skin Warm and dry   Labs    Chemistry Recent Labs  Lab 02/23/19 0832 02/24/19 0416  NA 132* 131*  K 4.2 3.8  CL 100 101  CO2 21* 19*  GLUCOSE 129* 125*  BUN 61* 64*   CREATININE 2.29* 2.32*  CALCIUM 8.8* 8.2*  GFRNONAA 24* 23*  GFRAA 27* 27*  ANIONGAP 11 11     Hematology Recent Labs  Lab 02/23/19 0832  WBC 14.7*  RBC 3.90*  HGB 12.6*  HCT 36.6*  MCV 93.8  MCH 32.3  MCHC 34.4  RDW 13.1  PLT 256    Cardiac EnzymesNo results for input(s): TROPONINI in the last 168 hours. No results for input(s): TROPIPOC in the last 168 hours.   BNP Recent Labs  Lab 02/23/19 0833  BNP 1,628.2*     DDimer No results for input(s): DDIMER in the last 168 hours.   Radiology    Dg Chest Port 1 View  Result Date: 02/23/2019 CLINICAL DATA:  Shortness of breath EXAM: PORTABLE CHEST 1 VIEW COMPARISON:  12/08/2010 FINDINGS: Mild subpleural reticulation/fibrosis in the lungs bilaterally. Pulmonary vascular congestion/possible mild interstitial edema. Suspected small bilateral pleural effusions. No pneumothorax. The heart is normal in size. IMPRESSION: Suspected mild interstitial edema with small bilateral pleural effusions. Possible underlying chronic interstitial lung disease. Electronically Signed   By: Julian Hy M.D.   On: 02/23/2019 08:54       Assessment &  Plan    *CHB  CHF acute/chronic diastolic  Renal insufficiency Acute/chronic grade 3  HTN  For pacing this am  Still iin heart failure  Expect physiology to improve following restoration of AV synchrony and normal heart rate    Signed, Virl Axe, MD  02/24/2019, 7:38 AM

## 2019-02-24 NOTE — Progress Notes (Signed)
Orthopedic Tech Progress Note Patient Details:  Andre [name] [birth year] [id number] RN said patient has on arm sling Patient ID: [name], male   DOB: March 27, 1923, 85 y.o.   MRN: [id number]   Janit Pagan 02/24/2019, 10:51 AM

## 2019-02-24 NOTE — Progress Notes (Signed)
  Echocardiogram 2D Echocardiogram has been performed.  Andre [name] 02/24/2019, 2:34 PM

## 2019-02-25 ENCOUNTER — Inpatient Hospital Stay (HOSPITAL_COMMUNITY): Payer: Medicare Other

## 2019-02-25 ENCOUNTER — Encounter (HOSPITAL_COMMUNITY): Payer: Self-pay | Admitting: Internal Medicine

## 2019-02-25 LAB — BASIC METABOLIC PANEL
Anion gap: 13 (ref 5–15)
BUN: 68 mg/dL — ABNORMAL HIGH (ref 8–23)
CO2: 20 mmol/L — ABNORMAL LOW (ref 22–32)
Calcium: 8.3 mg/dL — ABNORMAL LOW (ref 8.9–10.3)
Chloride: 100 mmol/L (ref 98–111)
Creatinine, Ser: 2.23 mg/dL — ABNORMAL HIGH (ref 0.61–1.24)
GFR calc Af Amer: 28 mL/min — ABNORMAL LOW (ref >60)
GFR, EST NON AFRICAN AMERICAN: 24 mL/min — AB (ref >60)
Glucose, Bld: 118 mg/dL — ABNORMAL HIGH (ref 70–99)
Potassium: 3.3 mmol/L — ABNORMAL LOW (ref 3.5–5.1)
Sodium: 133 mmol/L — ABNORMAL LOW (ref 135–145)

## 2019-02-25 MED ORDER — POTASSIUM CHLORIDE CRYS ER 20 MEQ PO TBCR
40.0000 meq | EXTENDED_RELEASE_TABLET | Freq: Two times a day (BID) | ORAL | Status: AC
  Administered 2019-02-25 (×2): 40 meq via ORAL
  Filled 2019-02-25 (×2): qty 2

## 2019-02-25 MED ORDER — FUROSEMIDE 10 MG/ML IJ SOLN
80.0000 mg | Freq: Two times a day (BID) | INTRAMUSCULAR | Status: DC
  Administered 2019-02-25 – 2019-02-28 (×7): 80 mg via INTRAVENOUS
  Filled 2019-02-25 (×8): qty 8

## 2019-02-25 NOTE — Progress Notes (Addendum)
Progress Note  Patient Name: Andre [name] Date of Encounter: 02/25/2019  Primary Cardiologist: [facility]  Primary Electrophysiologist: sK   Patient Profile     85 y.o. male admitted 3/14 with weakness and progressive conduction disease 2:1 block on Friday and yesterday CHB; Hx of Afib  Previously normal LV function 2017  Subjective   Still sob but better   O2 HF 14>>6 L   Inpatient Medications    Scheduled Meds: . amLODipine  10 mg Oral Daily  . atorvastatin  10 mg Oral Daily  . furosemide  40 mg Intravenous BID  . mouth rinse  15 mL Mouth Rinse BID  . sodium chloride flush  3 mL Intravenous Q12H   Continuous Infusions: . sodium chloride     PRN Meds: sodium chloride, acetaminophen, ondansetron (ZOFRAN) IV, sodium chloride flush   Vital Signs    Vitals:   02/25/19 0600 02/25/19 0700 02/25/19 0726 02/25/19 0759  BP: (!) 159/58 (!) 166/54    Pulse: 73 73    Resp: (!) 22 19    Temp:   98.1 F (36.7 C)   TempSrc:   Oral   SpO2: 96% 95%  92%  Weight:      Height:        Intake/Output Summary (Last 24 hours) at 02/25/2019 0835 Last data filed at 02/25/2019 0700 Gross per 24 hour  Intake 1064.36 ml  Output 915 ml  Net 149.36 ml   Filed Weights   02/24/19 0424 02/24/19 1032 02/25/19 0500  Weight: 76.4 kg 73 kg 71.1 kg    Telemetry    Atrial flutter with CVR- Personally Reviewed  ECG    HR 80 atrial flutter with RBBB  Personally Reviewed  Physical Exam    Well developed and nourished in no acute distress HENT normal Neck supple with JVP-  flat 6-8 Crackles  Regular rate and rhythm, no murmurs or gallops Abd-soft with active BS No Clubbing cyanosis edema Skin-warm and dry A & Oriented  Grossly normal sensory and motor function      Labs    Chemistry Recent Labs  Lab 02/23/19 0832 02/24/19 0416 02/25/19 0456  NA 132* 131* 133*  K 4.2 3.8 3.3*  CL 100 101 100  CO2 21* 19* 20*  GLUCOSE 129* 125* 118*  BUN 61* 64* 68*  CREATININE  2.29* 2.32* 2.23*  CALCIUM 8.8* 8.2* 8.3*  GFRNONAA 24* 23* 24*  GFRAA 27* 27* 28*  ANIONGAP 11 11 13      Hematology Recent Labs  Lab 02/23/19 0832  WBC 14.7*  RBC 3.90*  HGB 12.6*  HCT 36.6*  MCV 93.8  MCH 32.3  MCHC 34.4  RDW 13.1  PLT 256    Cardiac EnzymesNo results for input(s): TROPONINI in the last 168 hours. No results for input(s): TROPIPOC in the last 168 hours.   BNP Recent Labs  Lab 02/23/19 0833  BNP 1,628.2*     DDimer No results for input(s): DDIMER in the last 168 hours.   Radiology    Dg Chest Port 1 View  Result Date: 02/25/2019 CLINICAL DATA:  Status post pacemaker placement EXAM: PORTABLE CHEST 1 VIEW COMPARISON:  02/23/2019 FINDINGS: Cardiac shadow remains enlarged. Pacing device is now seen. No pneumothorax is noted. Vascular congestion is noted increased from the prior exam with increasing interstitial edema. Small right pleural effusion is noted. No focal confluent infiltrate is seen. IMPRESSION: Increasing vascular congestion with interstitial edema. No pneumothorax following pacemaker placement. Electronically Signed  By: Inez Catalina M.D.   On: 02/25/2019 07:18   Dg Chest Port 1 View  Result Date: 02/23/2019 CLINICAL DATA:  Shortness of breath EXAM: PORTABLE CHEST 1 VIEW COMPARISON:  12/08/2010 FINDINGS: Mild subpleural reticulation/fibrosis in the lungs bilaterally. Pulmonary vascular congestion/possible mild interstitial edema. Suspected small bilateral pleural effusions. No pneumothorax. The heart is normal in size. IMPRESSION: Suspected mild interstitial edema with small bilateral pleural effusions. Possible underlying chronic interstitial lung disease. Electronically Signed   By: Julian Hy M.D.   On: 02/23/2019 08:54   Device interrogation Normal device function Atrial flutter onset 2000h with intrinsic conduction     Echo normal LV function   Assessment & Plan    *CHB intermittent   CHF acute/chronic diastolic  Renal  insufficiency Acute/chronic grade 3  HTN  Hypokalemia  Atrial flutter/fib  CVR    Still dyspneic,  CXR consistent with CHF Will aggressively diurese  Anticipate Cr will come down Replete K   Will hold off on anticoagulation for now given fresh device pocket  Perhaps in am  Signed, Virl Axe, MD  02/25/2019, 8:35 AM

## 2019-02-25 NOTE — Care Management Note (Signed)
Case Management Note  Patient Details  Name: Andre [name] MRN: [id number] Date of Birth: 25-Apr-1923  Subjective/Objective:    CHB          Action/Plan: Primary Care Provider: [name], MD; has private insurance with Medicare; CM following for progression of care.  Expected Discharge Date:    possibly 02/26/2019              Expected Discharge Plan:  Home/Self Care  Discharge planning Services  CM Consult   Status of Service:  In process, will continue to follow  Sherrilyn Rist [phone] 02/25/2019, 1:03 PM

## 2019-02-26 LAB — BASIC METABOLIC PANEL
Anion gap: 11 (ref 5–15)
BUN: 74 mg/dL — ABNORMAL HIGH (ref 8–23)
CO2: 22 mmol/L (ref 22–32)
Calcium: 8.5 mg/dL — ABNORMAL LOW (ref 8.9–10.3)
Chloride: 104 mmol/L (ref 98–111)
Creatinine, Ser: 2.01 mg/dL — ABNORMAL HIGH (ref 0.61–1.24)
GFR calc Af Amer: 32 mL/min — ABNORMAL LOW (ref >60)
GFR calc non Af Amer: 28 mL/min — ABNORMAL LOW (ref >60)
Glucose, Bld: 118 mg/dL — ABNORMAL HIGH (ref 70–99)
Potassium: 4 mmol/L (ref 3.5–5.1)
Sodium: 137 mmol/L (ref 135–145)

## 2019-02-26 LAB — CBC
HCT: 36.4 % — ABNORMAL LOW (ref 39.0–52.0)
Hemoglobin: 12.4 g/dL — ABNORMAL LOW (ref 13.0–17.0)
MCH: 31.7 pg (ref 26.0–34.0)
MCHC: 34.1 g/dL (ref 30.0–36.0)
MCV: 93.1 fL (ref 80.0–100.0)
Platelets: 265 10*3/uL (ref 150–400)
RBC: 3.91 MIL/uL — ABNORMAL LOW (ref 4.22–5.81)
RDW: 13.1 % (ref 11.5–15.5)
WBC: 11.7 10*3/uL — ABNORMAL HIGH (ref 4.0–10.5)
nRBC: 0 % (ref 0.0–0.2)

## 2019-02-26 NOTE — Care Management Important Message (Signed)
Important Message  Patient Details  Name: Andre [name] MRN: [id number] Date of Birth: 1923/10/27   Medicare Important Message Given:  Yes    Letoya Stallone P Lateefah Mallery 02/26/2019, 1:56 PM

## 2019-02-26 NOTE — Progress Notes (Signed)
Please see in addition to PT note:   SATURATION QUALIFICATIONS: (This note is used to comply with regulatory documentation for home oxygen)  Patient Saturations on Room Air at Rest = 88%, required 1LPM O2 to increase to >90%  Patient Saturations on Room Air while Ambulating =  85% on 1LPM O2, required 2LPM O2 to increase to >90%  Patient Saturations on 2 Liters of oxygen while Ambulating = >90%  Please briefly explain why patient needs home oxygen:desaturation with activity even on 1LPM [place] PT, DPT, CBIS  Supplemental Physical Therapist [place]    Pager [phone] Acute Rehab Office [phone]

## 2019-02-26 NOTE — Evaluation (Signed)
Physical Therapy Evaluation Patient Details Name: Andre Andre [name] Andre [name] MRN: [id number] DOB: [birth year] Today's Date: 02/26/2019   History of Present Illness  Pt is a 85 yo male who presented to ED after worsening dyspnea and LE pain, found to have complete heart block. He is now s/p ICD pacemaker placement. PMH: HTN, CKD-III, CHF, PVD, A-fib.  Clinical Impression  Pt received in chair, willing to participate in therapy. Able to state pacemaker precautions. Overall functional mobility with supervision to min guard for safety (see details below). Pt's main limitation currently is SpO2 desaturation with activity(details below). Pt left in bed with daughter in room, on 1L/min O2, SpO2 94%. RN notified. Pt will continue to benefit from skilled acute PT services in order to safely progress functional mobility to ensure for safe DC home.     Follow Up Recommendations No PT follow up    Equipment Recommendations  None recommended by PT    Recommendations for Other Services       Precautions / Restrictions Precautions Precautions: ICD/Pacemaker Restrictions Weight Bearing Restrictions: Yes LUE Weight Bearing: Non weight bearing      Mobility  Bed Mobility Overal bed mobility: Needs Assistance Bed Mobility: Sit to Supine       Sit to supine: Supervision;HOB elevated   General bed mobility comments: supervision for safety; able to assist with scooting up in bed by pushing through feet  Transfers Overall transfer level: Needs assistance Equipment used: None Transfers: Sit to/from Stand Sit to Stand: Min guard         General transfer comment: Min guard for safety; pt able to stand from chair and from low toilet without physical assistance. Min guard for safety. Maintains pacemaker precautions  Ambulation/Gait Ambulation/Gait assistance: Min guard Gait Distance (Feet): 150 Feet Assistive device: None Gait Pattern/deviations: Step-through pattern;Decreased stride length Gait  velocity: decreased   General Gait Details: Pt able to march in place with no UE support with no LOB, so ambulated without AD into hallway. Despite visual deficits pt able to navigate hallway obstacles with minimal cues. SpO2 drop to 85% on 1 L O2, increased to 2 L O2 for remainder of walk. Recovered to >90% with approximately 2 min seated rest break.  Stairs            Wheelchair Mobility    Modified Rankin (Stroke Patients Only)       Balance Overall balance assessment: Needs assistance Sitting-balance support: Feet supported;No upper extremity supported Sitting balance-Leahy Scale: Good     Standing balance support: No upper extremity supported;During functional activity Standing balance-Leahy Scale: Fair Standing balance comment: able to ambulate without UE support, no LOB                             Pertinent Vitals/Pain Pain Assessment: No/denies pain    Home Living Family/patient expects to be discharged to:: Private residence Living Arrangements: Spouse/significant other Available Help at Discharge: Family;Available 24 hours/day Type of Home: House Home Access: Stairs to enter Entrance Stairs-Rails: Chemical engineer of Steps: 5 Home Layout: Two level;Able to live on main level with bedroom/bathroom Home Equipment: None Additional Comments: Laundry in basement    Prior Function Level of Independence: Independent               Hand Dominance        Extremity/Trunk Assessment   Upper Extremity Assessment Upper Extremity Assessment: Overall WFL for tasks assessed    Lower Extremity  Assessment Lower Extremity Assessment: Overall WFL for tasks assessed    Cervical / Trunk Assessment Cervical / Trunk Assessment: Normal  Communication   Communication: No difficulties  Cognition Arousal/Alertness: Awake/alert Behavior During Therapy: WFL for tasks assessed/performed Overall Cognitive Status: Within Functional Limits  for tasks assessed                                        General Comments General comments (skin integrity, edema, etc.): Pt with SpO2 88% upon arrival on room air. Replaced supplemental O2 1 L/min before ambulation. Drop to 85% during ambulation, increased to 2 L/min for rest of walk. Recovered within 2 min seated rest break.    Exercises     Assessment/Plan    PT Assessment Patient needs continued PT services  PT Problem List Decreased mobility;Cardiopulmonary status limiting activity;Decreased activity tolerance       PT Treatment Interventions Functional mobility training;Patient/family education;Gait training;Therapeutic activities;Stair training;Therapeutic exercise    PT Goals (Current goals can be found in the Care Plan section)  Acute Rehab PT Goals Patient Stated Goal: go home and start walking program again PT Goal Formulation: With patient/family Time For Goal Achievement: 03/12/19 Potential to Achieve Goals: Good    Frequency Min 3X/week   Barriers to discharge        Co-evaluation               AM-PAC PT "6 Clicks" Mobility  Outcome Measure Help needed turning from your back to your side while in a flat bed without using bedrails?: None Help needed moving from lying on your back to sitting on the side of a flat bed without using bedrails?: None Help needed moving to and from a bed to a chair (including a wheelchair)?: None Help needed standing up from a chair using your arms (e.g., wheelchair or bedside chair)?: None Help needed to walk in hospital room?: None Help needed climbing 3-5 steps with a railing? : A Little 6 Click Score: 23    End of Session Equipment Utilized During Treatment: Gait belt Activity Tolerance: Patient tolerated treatment well Patient left: in bed;with call bell/phone within reach;with family/visitor present;with bed alarm set Nurse Communication: Mobility status;Other (comment)(O2 desaturation during  ambulation) PT Visit Diagnosis: Other abnormalities of gait and mobility (R26.89)    Time:  -      Charges:              Andre Andre [name], SPT   Andre Andre [name] 02/26/2019, 4:21 PM

## 2019-02-26 NOTE — Progress Notes (Addendum)
Progress Note  Patient Name: Andre [name] Date of Encounter: 02/26/2019  Primary Cardiologist: Kirk Ruths, MD   Subjective   Feeling better, less SOB, stronger  Inpatient Medications    Scheduled Meds: . amLODipine  10 mg Oral Daily  . atorvastatin  10 mg Oral Daily  . furosemide  80 mg Intravenous BID  . mouth rinse  15 mL Mouth Rinse BID  . sodium chloride flush  3 mL Intravenous Q12H   Continuous Infusions: . sodium chloride     PRN Meds: sodium chloride, acetaminophen, ondansetron (ZOFRAN) IV, sodium chloride flush   Vital Signs    Vitals:   02/25/19 2011 02/26/19 0059 02/26/19 0633 02/26/19 0801  BP: (!) 153/60 (!) 169/53 (!) 149/61 (!) 149/62  Pulse: 78 77    Resp: 18 18    Temp: 98.4 F (36.9 C) 97.8 F (36.6 C) 98.6 F (37 C)   TempSrc: Oral Oral Oral   SpO2: 95% 94% 96%   Weight:   70.5 kg   Height:        Intake/Output Summary (Last 24 hours) at 02/26/2019 1234 Last data filed at 02/26/2019 0802 Gross per 24 hour  Intake 281 ml  Output 900 ml  Net -619 ml   Last 3 Weights 02/26/2019 02/25/2019 02/25/2019  Weight (lbs) 155 lb 8 oz 157 lb 14.4 oz 156 lb 12 oz  Weight (kg) 70.534 kg 71.623 kg 71.1 kg      Telemetry    AFlutter rate controlled - Personally Reviewed intrinsic conduction   ECG    No new EKGs - Personally Reviewed  Physical Exam   GEN: No acute distress.   Neck: JVP 8 Cardiac: irreg-irreg, no murmurs, rubs, or gallops.  Respiratory: diminished at the bases,  Crackles  rales. GI: Soft, nontender, non-distended  MS: trace edema; age appropriate atrophy Neuro:  Nonfocal  Psych: Normal affect   Labs    Chemistry Recent Labs  Lab 02/24/19 0416 02/25/19 0456 02/26/19 0409  NA 131* 133* 137  K 3.8 3.3* 4.0  CL 101 100 104  CO2 19* 20* 22  GLUCOSE 125* 118* 118*  BUN 64* 68* 74*  CREATININE 2.32* 2.23* 2.01*  CALCIUM 8.2* 8.3* 8.5*  GFRNONAA 23* 24* 28*  GFRAA 27* 28* 32*  ANIONGAP 11 13 11       Hematology Recent Labs  Lab 02/23/19 0832  WBC 14.7*  RBC 3.90*  HGB 12.6*  HCT 36.6*  MCV 93.8  MCH 32.3  MCHC 34.4  RDW 13.1  PLT 256    Cardiac EnzymesNo results for input(s): TROPONINI in the last 168 hours. No results for input(s): TROPIPOC in the last 168 hours.   BNP Recent Labs  Lab 02/23/19 0833  BNP 1,628.2*     DDimer No results for input(s): DDIMER in the last 168 hours.   Radiology    Dg Chest Port 1 View Result Date: 02/25/2019 CLINICAL DATA:  Status post pacemaker placement EXAM: PORTABLE CHEST 1 VIEW COMPARISON:  02/23/2019 FINDINGS: Cardiac shadow remains enlarged. Pacing device is now seen. No pneumothorax is noted. Vascular congestion is noted increased from the prior exam with increasing interstitial edema. Small right pleural effusion is noted. No focal confluent infiltrate is seen. IMPRESSION: Increasing vascular congestion with interstitial edema. No pneumothorax following pacemaker placement. Electronically Signed   By: Inez Catalina M.D.   On: 02/25/2019 07:18    Cardiac Studies   02/24/2019: TTE IMPRESSIONs  1. The left ventricle has normal systolic function  with an ejection fraction of 60-65%. The cavity size was normal. Left ventricular diastolic Doppler parameters are indeterminate.  2. The right ventricle has normal systolic function. The cavity was normal. There is no increase in right ventricular wall thickness.  3. No pericardial effusion seen, though image quality is limited.   TEE: 05/2016 StudyConclusions - Left ventricle: The cavity size was normal. Wall thickness was normal. Systolic function was normal. The estimated ejection fraction was in the range of 55% to 60%. Wall motion was normal; there were no regional wall motion abnormalities. No evidence of thrombus. - Mitral valve: There was mild regurgitation directed centrally. - Left atrium: The atrium was mildly to moderately dilated. No evidence of thrombus in the  atrial cavity or appendage. There was mildintermittent spontaneous echo contrast (&quot;smoke&quot;) in the appendage. Emptying velocity was reduced. - Tricuspid valve: There was mild-moderate regurgitation directed centrally. Impressions: - Successful cardioversion. No cardiac source of emboli was indentified.  Patient Profile     85 y.o. male with past medical history of paroxysmal atrial fibrillation (s/p DCCV in 2017,on Xarelto), chronic diastolic CHF (EF 40-76% by TEE in 05/2016), HTN, HLD, and PAD (known aortoiliac disease with medical therapy recommended) who presented to [facility] ED for evaluation of worsening dyspnea. Initial EKG showing CHB.  Assessment & Plan    1. Complete Heart Block Is s/p PPM implant Routine follow up is in place POD 1 CXR without ptx  2. Acute on Chronic Diastolic CHF Exacerbation He is diuresing Yesterday finally fluid neg 683ml Cumulatively though only has become euvolemic  He is feeling less SOB Remains on n/c O2, down to 4L Continue diuresis  I will ask PT to see him and start mobilizing Will continue to try and wean O2 (not on oxygen at home)  3. Paroxysmal Atrial Fibrillation, flutter CHA2DS2Vasc is at least 4, on Xarelto (held for PPM) Pocket looks OK, perhaps tonight, I will discuss with Dr. Caryl Comes  Pt inquires about DCCV Will need a few doses of his Xarelto and TEE/DCCV vs xarelto for 3 weeks then [place] outpatient.   4. HLD on Atorvastatin 10mg  daily.   5. Acute on Chronic Stage 3 CKD PTA ARB held Slowly trending down during diuresis  6. Hemoptysis     I note bloody sputum in his suction tubing.     He reports that every morning for YEARS has this    He reports significant post nasal drainage/irritation and for the first couple hours of every day, he has a productive cough, almost always dark brown, with blood.  This self resolves by mid morning.    Will check CBC, f/u WBC, h/h     For questions or updates,  please contact [place] Please consult [email or web address] for contact info under        Signed, Baldwin [place], PA-C  02/26/2019, 12:34 PM    Continue diuresis IV x 24 hrs Ambulate Incentive spirometer Anticipate resumption of Rivaroxaban in am And if necessary will do DCCV on Friday for AV synchrony  Reviewed with pt and family

## 2019-02-26 NOTE — Progress Notes (Signed)
Pt weaned to room air. Pt SpO2 on room air sitting= 90%. Pt states he is not short of breath. PT is working with pt now.

## 2019-02-26 NOTE — Plan of Care (Signed)
  Problem: Education: Goal: Knowledge of cardiac device and self-care will improve Outcome: Progressing   Problem: Cardiac: Goal: Ability to achieve and maintain adequate cardiopulmonary perfusion will improve Outcome: Progressing   Problem: Education: Goal: Knowledge of General Education information will improve Description Including pain rating scale, medication(s)/side effects and non-pharmacologic comfort measures Outcome: Progressing   Problem: Activity: Goal: Risk for activity intolerance will decrease Outcome: Progressing   Problem: Safety: Goal: Ability to remain free from injury will improve Outcome: Progressing

## 2019-02-26 NOTE — Progress Notes (Signed)
Incentive spirometer given to pt per order. Pt education given. Pt verbalizes understanding and states he has no questions.

## 2019-02-27 ENCOUNTER — Institutional Professional Consult (permissible substitution): Payer: Medicare Other | Admitting: Internal Medicine

## 2019-02-27 ENCOUNTER — Other Ambulatory Visit (HOSPITAL_COMMUNITY): Payer: Medicare Other

## 2019-02-27 DIAGNOSIS — L8990 Pressure ulcer of unspecified site, unspecified stage: Secondary | ICD-10-CM | POA: Insufficient documentation

## 2019-02-27 LAB — BASIC METABOLIC PANEL
Anion gap: 9 (ref 5–15)
BUN: 72 mg/dL — ABNORMAL HIGH (ref 8–23)
CHLORIDE: 104 mmol/L (ref 98–111)
CO2: 25 mmol/L (ref 22–32)
Calcium: 8.4 mg/dL — ABNORMAL LOW (ref 8.9–10.3)
Creatinine, Ser: 1.81 mg/dL — ABNORMAL HIGH (ref 0.61–1.24)
GFR calc Af Amer: 37 mL/min — ABNORMAL LOW (ref >60)
GFR calc non Af Amer: 32 mL/min — ABNORMAL LOW (ref >60)
Glucose, Bld: 132 mg/dL — ABNORMAL HIGH (ref 70–99)
Potassium: 3.4 mmol/L — ABNORMAL LOW (ref 3.5–5.1)
Sodium: 138 mmol/L (ref 135–145)

## 2019-02-27 MED ORDER — RIVAROXABAN 15 MG PO TABS
15.0000 mg | ORAL_TABLET | Freq: Every day | ORAL | Status: DC
  Administered 2019-02-27 – 2019-03-11 (×13): 15 mg via ORAL
  Filled 2019-02-27 (×14): qty 1

## 2019-02-27 MED ORDER — CARVEDILOL 3.125 MG PO TABS
3.1250 mg | ORAL_TABLET | Freq: Two times a day (BID) | ORAL | Status: DC
  Administered 2019-02-28 – 2019-03-04 (×10): 3.125 mg via ORAL
  Filled 2019-02-27 (×11): qty 1

## 2019-02-27 MED ORDER — CARVEDILOL 3.125 MG PO TABS
3.1250 mg | ORAL_TABLET | Freq: Two times a day (BID) | ORAL | Status: DC
  Administered 2019-02-27 (×2): 3.125 mg via ORAL
  Filled 2019-02-27 (×2): qty 1

## 2019-02-27 MED ORDER — POTASSIUM CHLORIDE CRYS ER 20 MEQ PO TBCR
40.0000 meq | EXTENDED_RELEASE_TABLET | Freq: Once | ORAL | Status: AC
  Administered 2019-02-27: 40 meq via ORAL
  Filled 2019-02-27: qty 2

## 2019-02-27 NOTE — Progress Notes (Signed)
Progress Note  Patient Name: Andre [name] Date of Encounter: 02/27/2019  Primary Cardiologist: Kirk Ruths, MD   Patient Profile     85 y.o. male with past medical history of paroxysmal atrial fibrillation (s/p DCCV in 2017,on Xarelto), chronic diastolic CHF (EF 29-56% by TEE in 05/2016), HTN, HLD, and PAD (known aortoiliac disease with medical therapy recommended) who presented to [facility] ED for evaluation of worsening dyspnea. Initial EKG showing CHB. Underwent pacing   CHF diuresing with IV lasix  New onset atrial flutter/fib   Subjective   The patient denies chest pain, shortness of breath, nocturnal dyspnea, orthopnea or peripheral edema.  There have been no palpitations, lightheadedness or syncope.     Inpatient Medications    Scheduled Meds: . amLODipine  10 mg Oral Daily  . atorvastatin  10 mg Oral Daily  . furosemide  80 mg Intravenous BID  . mouth rinse  15 mL Mouth Rinse BID  . potassium chloride  40 mEq Oral Once  . sodium chloride flush  3 mL Intravenous Q12H   Continuous Infusions: . sodium chloride     PRN Meds: sodium chloride, acetaminophen, ondansetron (ZOFRAN) IV, sodium chloride flush   Vital Signs    Vitals:   02/26/19 1948 02/27/19 0101 02/27/19 0640 02/27/19 0803  BP: (!) 150/59 (!) 145/52 (!) 151/56 (!) 140/58  Pulse: 76 75 74 79  Resp: 16   20  Temp: 98.9 F (37.2 C) 97.9 F (36.6 C) 98 F (36.7 C) 98.2 F (36.8 C)  TempSrc: Oral Oral Oral Oral  SpO2: 92% 90% 93% 95%  Weight:   69.6 kg   Height:        Intake/Output Summary (Last 24 hours) at 02/27/2019 0846 Last data filed at 02/27/2019 0300 Gross per 24 hour  Intake 240 ml  Output 800 ml  Net -560 ml   Last 3 Weights 02/27/2019 02/26/2019 02/25/2019  Weight (lbs) 153 lb 6.4 oz 155 lb 8 oz 157 lb 14.4 oz  Weight (kg) 69.582 kg 70.534 kg 71.623 kg      Telemetry    Aflutter/fib  rate controlled  - Personally Reviewed intrinsic conduction   ECG    No new EKGs  - Personally Reviewed  Physical Exam   Well developed and nourished in no acute distress HENT normal Neck supple with JVP-  Flat 6-7 Clear crackles  Regular rate and rhythm, no murmurs or gallops Abd-soft with active BS No Clubbing cyanosis edema Skin-warm and dry A & Oriented  Grossly normal sensory and motor function     Labs    Chemistry Recent Labs  Lab 02/25/19 0456 02/26/19 0409 02/27/19 0327  NA 133* 137 138  K 3.3* 4.0 3.4*  CL 100 104 104  CO2 20* 22 25  GLUCOSE 118* 118* 132*  BUN 68* 74* 72*  CREATININE 2.23* 2.01* 1.81*  CALCIUM 8.3* 8.5* 8.4*  GFRNONAA 24* 28* 32*  GFRAA 28* 32* 37*  ANIONGAP 13 11 9      Hematology Recent Labs  Lab 02/23/19 0832 02/26/19 1305  WBC 14.7* 11.7*  RBC 3.90* 3.91*  HGB 12.6* 12.4*  HCT 36.6* 36.4*  MCV 93.8 93.1  MCH 32.3 31.7  MCHC 34.4 34.1  RDW 13.1 13.1  PLT 256 265    Cardiac EnzymesNo results for input(s): TROPONINI in the last 168 hours. No results for input(s): TROPIPOC in the last 168 hours.   BNP Recent Labs  Lab 02/23/19 0833  BNP 1,628.2*  DDimer No results for input(s): DDIMER in the last 168 hours.   Radiology    Dg Chest Port 1 View Result Date: 02/25/2019 CLINICAL DATA:  Status post pacemaker placement EXAM: PORTABLE CHEST 1 VIEW COMPARISON:  02/23/2019 FINDINGS: Cardiac shadow remains enlarged. Pacing device is now seen. No pneumothorax is noted. Vascular congestion is noted increased from the prior exam with increasing interstitial edema. Small right pleural effusion is noted. No focal confluent infiltrate is seen. IMPRESSION: Increasing vascular congestion with interstitial edema. No pneumothorax following pacemaker placement. Electronically Signed   By: Inez Catalina M.D.   On: 02/25/2019 07:18    Cardiac Studies   02/24/2019: TTE IMPRESSIONs  1. The left ventricle has normal systolic function with an ejection fraction of 60-65%. The cavity size was normal. Left ventricular  diastolic Doppler parameters are indeterminate.  2. The right ventricle has normal systolic function. The cavity was normal. There is no increase in right ventricular wall thickness.  3. No pericardial effusion seen, though image quality is limited.   TEE: 05/2016 StudyConclusions - Left ventricle: The cavity size was normal. Wall thickness was normal. Systolic function was normal. The estimated ejection fraction was in the range of 55% to 60%. Wall motion was normal; there were no regional wall motion abnormalities. No evidence of thrombus. - Mitral valve: There was mild regurgitation directed centrally. - Left atrium: The atrium was mildly to moderately dilated. No evidence of thrombus in the atrial cavity or appendage. There was mildintermittent spontaneous echo contrast (&quot;smoke&quot;) in the appendage. Emptying velocity was reduced. - Tricuspid valve: There was mild-moderate regurgitation directed centrally. Impressions: - Successful cardioversion. No cardiac source of emboli was indentified.    Assessment & Plan    Complete Heart Block   Acute on Chronic Diastolic CHF Exacerbation   Persistent Atrial Fibrillation, flutter  .  Acute on Chronic Stage 3 CKD   Hemoptysis    plan IV diuresis for 24 more hrs Cr improving continue diuresis   BUN elevated For TEE DCCV on Friday ( if necessary)  At his age, would like to defer if functionally stable Begin Rivaroxaban today 15 mg Replete K              Signed, Virl Axe, MD  02/27/2019, 8:46 AM

## 2019-02-27 NOTE — Progress Notes (Signed)
Physical Therapy Treatment Patient Details Name: Andre [name] MRN: [id number] DOB: 08-Sep-1923 Today's Date: 02/27/2019    History of Present Illness Pt is a 85 y.o. male admitted 02/23/19 with SOB. EKG showed complete heart block; s/p pacemaker implant 3/14. Pt with new onset afib; plan for DCCV 3/20. PMH includes PAF, HTN, PAD, CKD III, CHF, afib.   PT Comments    Pt progressing well with mobility. Ambulatory without device at supervision-level, only requiring assist for oxygen tank. Pt required 4L O2 [place] to maintain SpO2 >87% on RA (see saturations qualifications note); pt asymptomatic with drop in SpO2 requiring cues for standing rest breaks and deep breathing.    Follow Up Recommendations  No PT follow up;Supervision - Intermittent     Equipment Recommendations  None recommended by PT(home oxygen)    Recommendations for Other Services       Precautions / Restrictions Precautions Precautions: ICD/Pacemaker    Mobility  Bed Mobility               General bed mobility comments: Received sitting in recliner  Transfers Overall transfer level: Independent Equipment used: None Transfers: Sit to/from Stand              Ambulation/Gait Ambulation/Gait assistance: Scientist, forensic (Feet): 400 Feet Assistive device: None Gait Pattern/deviations: Step-through pattern;Decreased stride length Gait velocity: Decreased Gait velocity interpretation: 1.31 - 2.62 ft/sec, indicative of limited community ambulator General Gait Details: Slow, steady gait without DME, supervision for safety; 2x instability with head turns and direction changes, but pt able to self-correct without LOB. Attributes short step length to being cautious with visual deficits/macular degeneration. SpO2 down to 80% on RA, requiring 4L O2 to maintain SpO2 >87%. Pt asymptomatic; cued for standing rest breaks/deep breathing with drop in SpO2   Stairs             Wheelchair Mobility     Modified Rankin (Stroke Patients Only)       Balance Overall balance assessment: Needs assistance Sitting-balance support: Feet supported;No upper extremity supported Sitting balance-Leahy Scale: Good     Standing balance support: No upper extremity supported;During functional activity Standing balance-Leahy Scale: Good               High level balance activites: Side stepping;Backward walking;Direction changes;Turns;Sudden stops;Head turns High Level Balance Comments: Slight instability noted with head turn and direction change, but pt able to self-correct without LOB. Pt also with h/o macular degeneration            Cognition Arousal/Alertness: Awake/alert Behavior During Therapy: WFL for tasks assessed/performed Overall Cognitive Status: Within Functional Limits for tasks assessed                                        Exercises      General Comments General comments (skin integrity, edema, etc.): Daughter present and supportive      Pertinent Vitals/Pain Pain Assessment: No/denies pain    Home Living                      Prior Function            PT Goals (current goals can now be found in the care plan section) Acute Rehab PT Goals Patient Stated Goal: go home and start walking program again PT Goal Formulation: With patient/family Time For Goal Achievement: 03/12/19 Potential to Achieve  Goals: Good Progress towards PT goals: Progressing toward goals    Frequency    Min 3X/week      PT Plan Current plan remains appropriate    Co-evaluation              AM-PAC PT "6 Clicks" Mobility   Outcome Measure  Help needed turning from your back to your side while in a flat bed without using bedrails?: None Help needed moving from lying on your back to sitting on the side of a flat bed without using bedrails?: None Help needed moving to and from a bed to a chair (including a wheelchair)?: None Help needed standing  up from a chair using your arms (e.g., wheelchair or bedside chair)?: None Help needed to walk in hospital room?: None Help needed climbing 3-5 steps with a railing? : A Little 6 Click Score: 23    End of Session Equipment Utilized During Treatment: Gait belt Activity Tolerance: Patient tolerated treatment well Patient left: with call bell/phone within reach;with family/visitor present(seated at sink for washup with daughter present) Nurse Communication: Mobility status PT Visit Diagnosis: Other abnormalities of gait and mobility (R26.89)     Time: 1583-0940 PT Time Calculation (min) (ACUTE ONLY): 19 min  Charges:  $Gait Training: 8-22 mins                    Andre [name], PT, DPT Acute Rehabilitation Services  Pager [phone] Office [place] 02/27/2019, 12:13 PM

## 2019-02-27 NOTE — Discharge Instructions (Addendum)
Supplemental Discharge Instructions for  Pacemaker/Defibrillator Patients  Activity No heavy lifting or vigorous activity with your left/right arm for 6 to 8 weeks.  Do not raise your left/right arm above your head for one week.  Gradually raise your affected arm as drawn below.              02/28/2019                03/01/2019                03/02/2019               03/03/2019 __  NO DRIVING for 1 week    ; you may begin driving on  1/44/8185   .  WOUND CARE - Keep the wound area clean and dry.  Do not get this area wet for 24 hours. you may shower on 02/25/2019 evening    . - The tape/steri-strips on your wound will fall off; do not pull them off.  No bandage is needed on the site.  DO  NOT apply any creams, oils, or ointments to the wound area. - If you notice any drainage or discharge from the wound, any swelling or bruising at the site, or you develop a fever > 101? F after you are discharged home, call the office at once.  Special Instructions - You are still able to use cellular telephones; use the ear opposite the side where you have your pacemaker/defibrillator.  Avoid carrying your cellular phone near your device. - When traveling through airports, show security personnel your identification card to avoid being screened in the metal detectors.  Ask the security personnel to use the hand wand. - Avoid arc welding equipment, MRI testing (magnetic resonance imaging), TENS units (transcutaneous nerve stimulators).  Call the office for questions about other devices. - Avoid electrical appliances that are in poor condition or are not properly grounded. - Microwave ovens are safe to be near or to operate.     Information on my medicine - XARELTO (Rivaroxaban)  Why was Xarelto prescribed for you? Xarelto was prescribed for you to reduce the risk of a blood clot forming that can cause a stroke if you have a medical condition called atrial fibrillation (a type of irregular  heartbeat).  What do you need to know about xarelto ? Take your Xarelto ONCE DAILY at the same time every day with your evening meal. If you have difficulty swallowing the tablet whole, you may crush it and mix in applesauce just prior to taking your dose.  Take Xarelto exactly as prescribed by your doctor and DO NOT stop taking Xarelto without talking to the doctor who prescribed the medication.  Stopping without other stroke prevention medication to take the place of Xarelto may increase your risk of developing a clot that causes a stroke.  Refill your prescription before you run out.  After discharge, you should have regular check-up appointments with your healthcare provider that is prescribing your Xarelto.  In the future your dose may need to be changed if your kidney function or weight changes by a significant amount.  What do you do if you miss a dose? If you are taking Xarelto ONCE DAILY and you miss a dose, take it as soon as you remember on the same day then continue your regularly scheduled once daily regimen the next day. Do not take two doses of Xarelto at the same time or on the same day.  Important Safety Information A possible side effect of Xarelto is bleeding. You should call your healthcare provider right away if you experience any of the following: ? Bleeding from an injury or your nose that does not stop. ? Unusual colored urine (red or dark brown) or unusual colored stools (red or black). ? Unusual bruising for unknown reasons. ? A serious fall or if you hit your head (even if there is no bleeding).  Some medicines may interact with Xarelto and might increase your risk of bleeding while on Xarelto. To help avoid this, consult your healthcare provider or pharmacist prior to using any new prescription or non-prescription medications, including herbals, vitamins, non-steroidal anti-inflammatory drugs (NSAIDs) and supplements.  This website has more information on  Xarelto: [email or web address]

## 2019-02-27 NOTE — Progress Notes (Signed)
SATURATION QUALIFICATIONS: (This note is used to comply with regulatory documentation for home oxygen)  Patient Saturations on Room Air at Rest = 91%  Patient Saturations on Room Air while Ambulating = 80%  Patient Saturations on 4 Liters of oxygen while Ambulating = 88%  Please briefly explain why patient needs home oxygen: Pt requires supplemental oxygen to maintain SpO2 >/88% with ambulation.  Mabeline Caras, PT, DPT Acute Rehabilitation Services  Pager [phone] Office [phone]

## 2019-02-28 ENCOUNTER — Inpatient Hospital Stay (HOSPITAL_COMMUNITY): Payer: Medicare Other

## 2019-02-28 DIAGNOSIS — I442 Atrioventricular block, complete: Principal | ICD-10-CM

## 2019-02-28 DIAGNOSIS — I5033 Acute on chronic diastolic (congestive) heart failure: Secondary | ICD-10-CM

## 2019-02-28 LAB — BASIC METABOLIC PANEL
Anion gap: 11 (ref 5–15)
BUN: 65 mg/dL — ABNORMAL HIGH (ref 8–23)
CO2: 22 mmol/L (ref 22–32)
Calcium: 8.5 mg/dL — ABNORMAL LOW (ref 8.9–10.3)
Chloride: 104 mmol/L (ref 98–111)
Creatinine, Ser: 1.66 mg/dL — ABNORMAL HIGH (ref 0.61–1.24)
GFR, EST AFRICAN AMERICAN: 41 mL/min — AB (ref >60)
GFR, EST NON AFRICAN AMERICAN: 35 mL/min — AB (ref >60)
Glucose, Bld: 125 mg/dL — ABNORMAL HIGH (ref 70–99)
Potassium: 3.3 mmol/L — ABNORMAL LOW (ref 3.5–5.1)
Sodium: 137 mmol/L (ref 135–145)

## 2019-02-28 MED ORDER — SODIUM CHLORIDE 0.9% FLUSH: 3.0000 mL | INTRAVENOUS | Status: DC | PRN

## 2019-02-28 MED ORDER — HYDROCORTISONE 1 % EX CREA
1.0000 "application " | TOPICAL_CREAM | Freq: Three times a day (TID) | CUTANEOUS | Status: DC | PRN
  Filled 2019-02-28: qty 28

## 2019-02-28 MED ORDER — SODIUM CHLORIDE 0.9 % IV SOLN
INTRAVENOUS | Status: DC
  Administered 2019-03-01 (×2): via INTRAVENOUS

## 2019-02-28 MED ORDER — SODIUM CHLORIDE 0.9 % IV SOLN: 250.0000 mL | INTRAVENOUS | Status: DC

## 2019-02-28 MED ORDER — DIPHENHYDRAMINE HCL 25 MG PO CAPS
25.0000 mg | ORAL_CAPSULE | Freq: Every day | ORAL | Status: AC
  Administered 2019-02-28 – 2019-03-01 (×2): 25 mg via ORAL
  Filled 2019-02-28 (×2): qty 1

## 2019-02-28 MED ORDER — SODIUM CHLORIDE 0.9% FLUSH
3.0000 mL | Freq: Two times a day (BID) | INTRAVENOUS | Status: DC
  Administered 2019-02-28 – 2019-03-12 (×16): 3 mL via INTRAVENOUS

## 2019-02-28 MED ORDER — POTASSIUM CHLORIDE CRYS ER 20 MEQ PO TBCR
40.0000 meq | EXTENDED_RELEASE_TABLET | ORAL | Status: AC
  Administered 2019-02-28 (×2): 40 meq via ORAL
  Filled 2019-02-28 (×2): qty 2

## 2019-02-28 NOTE — H&P (View-Only) (Signed)
Progress Note  Patient Name: Andre [name] Date of Encounter: 02/28/2019  Primary Cardiologist: Kirk Ruths, MD   Subjective   Feeling better daily, less SOB Walked better but still with desaturation   HR well controlled on telemetry Personally reviewed    Inpatient Medications    Scheduled Meds: . amLODipine  10 mg Oral Daily  . atorvastatin  10 mg Oral Daily  . carvedilol  3.125 mg Oral BID WC  . furosemide  80 mg Intravenous BID  . mouth rinse  15 mL Mouth Rinse BID  . potassium chloride  40 mEq Oral Q3H  . rivaroxaban  15 mg Oral Q supper  . sodium chloride flush  3 mL Intravenous Q12H   Continuous Infusions: . sodium chloride     PRN Meds: sodium chloride, acetaminophen, ondansetron (ZOFRAN) IV, sodium chloride flush   Vital Signs    Vitals:   02/27/19 1439 02/27/19 2047 02/28/19 0514 02/28/19 0730  BP: (!) 146/59 (!) 134/53 (!) 157/76 (!) 153/58  Pulse: 69 69 77 78  Resp: (!) 2 18  20   Temp: (!) 97.5 F (36.4 C) 98.8 F (37.1 C) 98.2 F (36.8 C) 98 F (36.7 C)  TempSrc: Oral Oral Oral Oral  SpO2: 94% 91% 91% 93%  Weight:   68.9 kg   Height:        Intake/Output Summary (Last 24 hours) at 02/28/2019 1016 Last data filed at 02/28/2019 0851 Gross per 24 hour  Intake 840 ml  Output 1751 ml  Net -911 ml   Last 3 Weights 02/28/2019 02/27/2019 02/26/2019  Weight (lbs) 152 lb 153 lb 6.4 oz 155 lb 8 oz  Weight (kg) 68.947 kg 69.582 kg 70.534 kg      Telemetry    AFlutter rate controlled, more often pacing now - Personally Reviewed intrinsic conduction   ECG    No new EKGs - Personally Reviewed  Physical Exam   GEN: No acute distress.   Neck: JVP 8 Cardiac: irreg-irreg, no murmurs, rubs, or gallops.  Respiratory: diminished at the bases, no crackles GI: Soft, nontender, non-distended  MS: trace edema; age appropriate atrophy Neuro:  Nonfocal  Psych: Normal affect   Labs    Chemistry Recent Labs  Lab 02/26/19 0409 02/27/19 0327  02/28/19 0434  NA 137 138 137  K 4.0 3.4* 3.3*  CL 104 104 104  CO2 22 25 22   GLUCOSE 118* 132* 125*  BUN 74* 72* 65*  CREATININE 2.01* 1.81* 1.66*  CALCIUM 8.5* 8.4* 8.5*  GFRNONAA 28* 32* 35*  GFRAA 32* 37* 41*  ANIONGAP 11 9 11      Hematology Recent Labs  Lab 02/23/19 0832 02/26/19 1305  WBC 14.7* 11.7*  RBC 3.90* 3.91*  HGB 12.6* 12.4*  HCT 36.6* 36.4*  MCV 93.8 93.1  MCH 32.3 31.7  MCHC 34.4 34.1  RDW 13.1 13.1  PLT 256 265    Cardiac EnzymesNo results for input(s): TROPONINI in the last 168 hours. No results for input(s): TROPIPOC in the last 168 hours.   BNP Recent Labs  Lab 02/23/19 0833  BNP 1,628.2*     DDimer No results for input(s): DDIMER in the last 168 hours.   Radiology    Dg Chest Port 1 View Result Date: 02/25/2019 CLINICAL DATA:  Status post pacemaker placement EXAM: PORTABLE CHEST 1 VIEW COMPARISON:  02/23/2019 FINDINGS: Cardiac shadow remains enlarged. Pacing device is now seen. No pneumothorax is noted. Vascular congestion is noted increased from the prior exam with  increasing interstitial edema. Small right pleural effusion is noted. No focal confluent infiltrate is seen. IMPRESSION: Increasing vascular congestion with interstitial edema. No pneumothorax following pacemaker placement. Electronically Signed   By: Inez Catalina M.D.   On: 02/25/2019 07:18    Cardiac Studies   02/24/2019: TTE IMPRESSIONs  1. The left ventricle has normal systolic function with an ejection fraction of 60-65%. The cavity size was normal. Left ventricular diastolic Doppler parameters are indeterminate.  2. The right ventricle has normal systolic function. The cavity was normal. There is no increase in right ventricular wall thickness.  3. No pericardial effusion seen, though image quality is limited.   TEE: 05/2016 StudyConclusions - Left ventricle: The cavity size was normal. Wall thickness was normal. Systolic function was normal. The estimated ejection  fraction was in the range of 55% to 60%. Wall motion was normal; there were no regional wall motion abnormalities. No evidence of thrombus. - Mitral valve: There was mild regurgitation directed centrally. - Left atrium: The atrium was mildly to moderately dilated. No evidence of thrombus in the atrial cavity or appendage. There was mildintermittent spontaneous echo contrast (&quot;smoke&quot;) in the appendage. Emptying velocity was reduced. - Tricuspid valve: There was mild-moderate regurgitation directed centrally. Impressions: - Successful cardioversion. No cardiac source of emboli was indentified.  Patient Profile     85 y.o. male with past medical history of paroxysmal atrial fibrillation (s/p DCCV in 2017,on Xarelto), chronic diastolic CHF (EF 65-68% by TEE in 05/2016), HTN, HLD, and PAD (known aortoiliac disease with medical therapy recommended) who presented to [facility] ED for evaluation of worsening dyspnea. Initial EKG showing CHB.  Assessment & Plan    1. Complete Heart Block Is s/p PPM implant Routine follow up is in place POD 1 CXR without ptx  2. Acute on Chronic Diastolic CHF Exacerbation He is diuresing Yesterday finally fluid neg -1321 Cumulatively neg now -1995  His RA sats yesterday were 91%, though required 4L during ambulation to maintain >88% woth DOE Hopefully this will improve, I will d/w Dr. Caryl Comes, he is programmed DDD, wonder if rate response may be helpful Will get CXR today He is afebrile  3. Paroxysmal Atrial Fibrillation, flutter CHA2DS2Vasc is at least 4, on Xarelto (held for PPM) Pocket looks OK, xarelto started last night  On tomorrow's schedule for TEE/DCCV The patient reports his DOE feels like it did 3 years ago when he was out of rhythm.  Would like [place] to continue to consider TEE/DCCV (he has had before)  4. HLD on Atorvastatin 10mg  daily.   5. Acute on Chronic Stage 3 CKD PTA ARB held Continues downward trend  65/1.66 today  6. Hemoptysis     I note bloody sputum in his suction tubing.     He reported that every morning for YEARS has this    He reports significant post nasal drainage/irritation and for the first couple hours of every day, he has a productive cough, almost always dark brown, with blood.  This self resolves by mid morning.    H/H ok, WBC down 11.7     For questions or updates, please contact [place] Please consult [email or web address] for contact info under        Signed, Baldwin Jamaica, PA-C  02/28/2019, 10:16 AM    Seen and examined  Still in flutter with ongoing dyspnea despite very good rate control Some CHF and with diuresis his Cr continues to improve  For TEE DCCV in am  May need  help from hospitalist if ongoing O2 post cardioversion but CXR continues to show edema

## 2019-02-28 NOTE — Progress Notes (Addendum)
Progress Note  Patient Name: Andre [name] Date of Encounter: 02/28/2019  Primary Cardiologist: Kirk Ruths, MD   Subjective   Feeling better daily, less SOB Walked better but still with desaturation   HR well controlled on telemetry Personally reviewed    Inpatient Medications    Scheduled Meds: . amLODipine  10 mg Oral Daily  . atorvastatin  10 mg Oral Daily  . carvedilol  3.125 mg Oral BID WC  . furosemide  80 mg Intravenous BID  . mouth rinse  15 mL Mouth Rinse BID  . potassium chloride  40 mEq Oral Q3H  . rivaroxaban  15 mg Oral Q supper  . sodium chloride flush  3 mL Intravenous Q12H   Continuous Infusions: . sodium chloride     PRN Meds: sodium chloride, acetaminophen, ondansetron (ZOFRAN) IV, sodium chloride flush   Vital Signs    Vitals:   02/27/19 1439 02/27/19 2047 02/28/19 0514 02/28/19 0730  BP: (!) 146/59 (!) 134/53 (!) 157/76 (!) 153/58  Pulse: 69 69 77 78  Resp: (!) 2 18  20   Temp: (!) 97.5 F (36.4 C) 98.8 F (37.1 C) 98.2 F (36.8 C) 98 F (36.7 C)  TempSrc: Oral Oral Oral Oral  SpO2: 94% 91% 91% 93%  Weight:   68.9 kg   Height:        Intake/Output Summary (Last 24 hours) at 02/28/2019 1016 Last data filed at 02/28/2019 0851 Gross per 24 hour  Intake 840 ml  Output 1751 ml  Net -911 ml   Last 3 Weights 02/28/2019 02/27/2019 02/26/2019  Weight (lbs) 152 lb 153 lb 6.4 oz 155 lb 8 oz  Weight (kg) 68.947 kg 69.582 kg 70.534 kg      Telemetry    AFlutter rate controlled, more often pacing now - Personally Reviewed intrinsic conduction   ECG    No new EKGs - Personally Reviewed  Physical Exam   GEN: No acute distress.   Neck: JVP 8 Cardiac: irreg-irreg, no murmurs, rubs, or gallops.  Respiratory: diminished at the bases, no crackles GI: Soft, nontender, non-distended  MS: trace edema; age appropriate atrophy Neuro:  Nonfocal  Psych: Normal affect   Labs    Chemistry Recent Labs  Lab 02/26/19 0409 02/27/19 0327  02/28/19 0434  NA 137 138 137  K 4.0 3.4* 3.3*  CL 104 104 104  CO2 22 25 22   GLUCOSE 118* 132* 125*  BUN 74* 72* 65*  CREATININE 2.01* 1.81* 1.66*  CALCIUM 8.5* 8.4* 8.5*  GFRNONAA 28* 32* 35*  GFRAA 32* 37* 41*  ANIONGAP 11 9 11      Hematology Recent Labs  Lab 02/23/19 0832 02/26/19 1305  WBC 14.7* 11.7*  RBC 3.90* 3.91*  HGB 12.6* 12.4*  HCT 36.6* 36.4*  MCV 93.8 93.1  MCH 32.3 31.7  MCHC 34.4 34.1  RDW 13.1 13.1  PLT 256 265    Cardiac EnzymesNo results for input(s): TROPONINI in the last 168 hours. No results for input(s): TROPIPOC in the last 168 hours.   BNP Recent Labs  Lab 02/23/19 0833  BNP 1,628.2*     DDimer No results for input(s): DDIMER in the last 168 hours.   Radiology    Dg Chest Port 1 View Result Date: 02/25/2019 CLINICAL DATA:  Status post pacemaker placement EXAM: PORTABLE CHEST 1 VIEW COMPARISON:  02/23/2019 FINDINGS: Cardiac shadow remains enlarged. Pacing device is now seen. No pneumothorax is noted. Vascular congestion is noted increased from the prior exam with  increasing interstitial edema. Small right pleural effusion is noted. No focal confluent infiltrate is seen. IMPRESSION: Increasing vascular congestion with interstitial edema. No pneumothorax following pacemaker placement. Electronically Signed   By: Inez Catalina M.D.   On: 02/25/2019 07:18    Cardiac Studies   02/24/2019: TTE IMPRESSIONs  1. The left ventricle has normal systolic function with an ejection fraction of 60-65%. The cavity size was normal. Left ventricular diastolic Doppler parameters are indeterminate.  2. The right ventricle has normal systolic function. The cavity was normal. There is no increase in right ventricular wall thickness.  3. No pericardial effusion seen, though image quality is limited.   TEE: 05/2016 StudyConclusions - Left ventricle: The cavity size was normal. Wall thickness was normal. Systolic function was normal. The estimated ejection  fraction was in the range of 55% to 60%. Wall motion was normal; there were no regional wall motion abnormalities. No evidence of thrombus. - Mitral valve: There was mild regurgitation directed centrally. - Left atrium: The atrium was mildly to moderately dilated. No evidence of thrombus in the atrial cavity or appendage. There was mildintermittent spontaneous echo contrast (&quot;smoke&quot;) in the appendage. Emptying velocity was reduced. - Tricuspid valve: There was mild-moderate regurgitation directed centrally. Impressions: - Successful cardioversion. No cardiac source of emboli was indentified.  Patient Profile     85 y.o. male with past medical history of paroxysmal atrial fibrillation (s/p DCCV in 2017,on Xarelto), chronic diastolic CHF (EF 70-17% by TEE in 05/2016), HTN, HLD, and PAD (known aortoiliac disease with medical therapy recommended) who presented to [facility] ED for evaluation of worsening dyspnea. Initial EKG showing CHB.  Assessment & Plan    1. Complete Heart Block Is s/p PPM implant Routine follow up is in place POD 1 CXR without ptx  2. Acute on Chronic Diastolic CHF Exacerbation He is diuresing Yesterday finally fluid neg -1321 Cumulatively neg now -1995  His RA sats yesterday were 91%, though required 4L during ambulation to maintain >88% woth DOE Hopefully this will improve, I will d/w Dr. Caryl Comes, he is programmed DDD, wonder if rate response may be helpful Will get CXR today He is afebrile  3. Paroxysmal Atrial Fibrillation, flutter CHA2DS2Vasc is at least 4, on Xarelto (held for PPM) Pocket looks OK, xarelto started last night  On tomorrow's schedule for TEE/DCCV The patient reports his DOE feels like it did 3 years ago when he was out of rhythm.  Would like [place] to continue to consider TEE/DCCV (he has had before)  4. HLD on Atorvastatin 10mg  daily.   5. Acute on Chronic Stage 3 CKD PTA ARB held Continues downward trend  65/1.66 today  6. Hemoptysis     I note bloody sputum in his suction tubing.     He reported that every morning for YEARS has this    He reports significant post nasal drainage/irritation and for the first couple hours of every day, he has a productive cough, almost always dark brown, with blood.  This self resolves by mid morning.    H/H ok, WBC down 11.7     For questions or updates, please contact [place] Please consult [email or web address] for contact info under        Signed, Baldwin Jamaica, PA-C  02/28/2019, 10:16 AM    Seen and examined  Still in flutter with ongoing dyspnea despite very good rate control Some CHF and with diuresis his Cr continues to improve  For TEE DCCV in am  May need  help from hospitalist if ongoing O2 post cardioversion but CXR continues to show edema

## 2019-02-28 NOTE — Progress Notes (Addendum)
Physical Therapy Treatment Patient Details Name: Andre [name] MRN: [id number] DOB: 10/05/1923 Today's Date: 02/28/2019    History of Present Illness Pt is a 85 y.o. male admitted 02/23/19 with SOB. EKG showed complete heart block; s/p pacemaker implant 3/14. Pt with new onset afib; plan for DCCV 3/20. PMH includes PAF, HTN, PAD, CKD III, CHF, afib.   PT Comments    Pt progressing well with mobility. Indep with ambulation and mobility, only requiring assist to manage O2 tank and monitor saturations; SpO2 down to 81% on 4L O2 [place], quickly returning to >87% with seated rest and deep breathing. Pt and daughter educated on likely need for discharge home with oxygen. Reinforced incentive spirometer use. Pt encouraged to continue ambulating with assist from nursing staff. Has met short-term PT goals. D/c acute PT.    Follow Up Recommendations  No PT follow up;Supervision - Intermittent     Equipment Recommendations  None recommended by PT(home oxygen)    Recommendations for Other Services       Precautions / Restrictions Precautions Precautions: ICD/Pacemaker    Mobility  Bed Mobility Overal bed mobility: Independent             General bed mobility comments: Received sitting in recliner  Transfers Overall transfer level: Independent Equipment used: None Transfers: Sit to/from Stand              Ambulation/Gait Ambulation/Gait assistance: Supervision Gait Distance (Feet): 500 Feet Assistive device: None Gait Pattern/deviations: Step-through pattern;Decreased stride length Gait velocity: Decreased Gait velocity interpretation: 1.31 - 2.62 ft/sec, indicative of limited community ambulator General Gait Details: Pt moving independently, only requiring supervision to monitor SpO2 saturations and manage O2 tank; pt without dyspnea or fatigue. SpO2 down to 81% on 4L O2 [place], returning to >87% with seated rest   Stairs             Wheelchair Mobility     Modified Rankin (Stroke Patients Only)       Balance Overall balance assessment: Needs assistance Sitting-balance support: Feet supported;No upper extremity supported Sitting balance-Leahy Scale: Good     Standing balance support: No upper extremity supported;During functional activity Standing balance-Leahy Scale: Good                              Cognition Arousal/Alertness: Awake/alert Behavior During Therapy: WFL for tasks assessed/performed Overall Cognitive Status: Within Functional Limits for tasks assessed                                        Exercises      General Comments General comments (skin integrity, edema, etc.): Daughter present. Nursing student present and educ on O2 application and SpO2 monitoring      Pertinent Vitals/Pain Pain Assessment: No/denies pain    Home Living                      Prior Function            PT Goals (current goals can now be found in the care plan section) Progress towards PT goals: Goals met/education completed, patient discharged from PT    Frequency    Min 3X/week      PT Plan Current plan remains appropriate    Co-evaluation                AM-PAC PT "6 Clicks" Mobility   Outcome Measure  Help needed turning from your back to your side while in a flat bed without using bedrails?: None Help needed moving from lying on your back to sitting on the side of a flat bed without using bedrails?: None Help needed moving to and from a bed to a chair (including a wheelchair)?: None Help needed standing up from a chair using your arms (e.g., wheelchair or bedside chair)?: None Help needed to walk in hospital room?: None Help needed climbing 3-5 steps with a railing? : A Little 6 Click Score: 23    End of Session Equipment Utilized During Treatment: Oxygen Activity Tolerance: Patient tolerated treatment well Patient left: in chair;with call bell/phone within reach;with  nursing/sitter in room;with family/visitor present Nurse Communication: Mobility status PT Visit Diagnosis: Other abnormalities of gait and mobility (R26.89)     Time: 0819-0843 PT Time Calculation (min) (ACUTE ONLY): 24 min  Charges:  $Gait Training: 8-22 mins $Self Care/Home Management: 8-22                    Jaclyn Redman, PT, DPT Acute Rehabilitation Services  Pager [phone] Office [phone]  Jaclyn L Redman 02/28/2019, 9:22 AM   

## 2019-02-28 NOTE — TOC Initial Note (Signed)
Transition of Care [facility]) - Initial/Assessment Note    Patient Details  Name: Andre [name] MRN: [id number] Date of Birth: 1923/10/31  Transition of Care [facility]) CM/SW Contact:    Sherrilyn Rist Phone Number: [phone] 02/28/2019, 12:37 PM  Clinical Narrative:                 CM following for progression of care; patient lives at home with spouse; Primary Care Provider: [name], MD; has private insurance with Medicare; plans for TEE with possible [place]; remains on IV lasix.  Expected Discharge Plan: Home/Self Care Barriers to Discharge: No Barriers Identified   Patient Goals and CMS Choice Patient states their goals for this hospitalization and ongoing recovery are:: to stay at home CMS Medicare.gov Compare Post Acute Care list provided to:: Patient    Expected Discharge Plan and Services Expected Discharge Plan: Home/Self Care In-house Referral: NA Discharge Planning Services: CM Consult                     DME Arranged: N/A DME Agency: NA HH Arranged: NA HH Agency: NA  Prior Living Arrangements/Services   Lives with:: Spouse Patient language and need for interpreter reviewed:: No Do you feel safe going back to the place where you live?: Yes      Need for Family Participation in Patient Care: No (Comment) Care giver support system in place?: Yes (comment)   Criminal Activity/Legal Involvement Pertinent to Current Situation/Hospitalization: No - Comment as needed  Activities of Daily Living Home Assistive Devices/Equipment: None ADL Screening (condition at time of admission) Patient's cognitive ability adequate to safely complete daily activities?: Yes Is the patient deaf or have difficulty hearing?: Yes Does the patient have difficulty seeing, even when wearing glasses/contacts?: Yes Does the patient have difficulty concentrating, remembering, or making decisions?: No Patient able to express need for assistance with ADLs?: No Does  the patient have difficulty dressing or bathing?: No Independently performs ADLs?: Yes (appropriate for developmental age) Does the patient have difficulty walking or climbing stairs?: No Weakness of Legs: None Weakness of Arms/Hands: None  Permission Sought/Granted   Permission granted to share information with : Yes, Verbal Permission Granted  Share Information with NAME: Aileen spouse           Emotional Assessment Appearance:: Appears stated age Attitude/Demeanor/Rapport: Gracious Affect (typically observed): Accepting Orientation: : Oriented to Self, Oriented to  Time, Oriented to Place Alcohol / Substance Use: Not Applicable Psych Involvement: No (comment)  Admission diagnosis:  sob Patient Active Problem List   Diagnosis Date Noted  . Pressure injury of skin 02/27/2019  . CHB (complete heart block) ([place]) 02/23/2019  . Peripheral vascular disease ([place]) 04/21/2016  . Atrial fibrillation ([place]) 03/17/2016  . Diastolic CHF, chronic ([place]) 01/05/2016  . Macular degeneration 02/17/2015  . CKD (chronic kidney disease), stage III ([place]) 02/17/2015  . Former smoker 02/17/2015  . History of skin cancer   . Malignant neoplasm of prostate ([place]) 01/05/2010  . Hyperlipidemia 12/04/2007  . Essential hypertension 12/04/2007  . Angiodysplasia of intestine with hemorrhage 05/15/2007   PCP:  [name], MD Pharmacy:   [facility], [place] [address] Phone: [phone] Fax: [phone]  [facility], [place] to Registered [place] [address] Phone: [phone] Fax: [phone]     Social Determinants  of Health (SDOH) Interventions    Readmission Risk Interventions No flowsheet data found.  

## 2019-02-28 NOTE — Progress Notes (Deleted)
Patient came up to unit with foley post ICD removal, RN attempted to remove foley but pt refuse and states he doesn't wants to be bother.

## 2019-03-01 ENCOUNTER — Inpatient Hospital Stay (HOSPITAL_COMMUNITY): Payer: Medicare Other | Admitting: Certified Registered"

## 2019-03-01 ENCOUNTER — Encounter (HOSPITAL_COMMUNITY)
Admission: EM | Disposition: A | Discharge status: Skilled Nursing Facility | Payer: Self-pay | Source: Home / Self Care | Attending: Internal Medicine

## 2019-03-01 ENCOUNTER — Inpatient Hospital Stay (HOSPITAL_COMMUNITY): Payer: Medicare Other

## 2019-03-01 ENCOUNTER — Encounter (HOSPITAL_COMMUNITY): Payer: Self-pay | Admitting: Cardiology

## 2019-03-01 DIAGNOSIS — I4892 Unspecified atrial flutter: Secondary | ICD-10-CM

## 2019-03-01 DIAGNOSIS — I483 Typical atrial flutter: Secondary | ICD-10-CM

## 2019-03-01 DIAGNOSIS — I5043 Acute on chronic combined systolic (congestive) and diastolic (congestive) heart failure: Secondary | ICD-10-CM

## 2019-03-01 HISTORY — PX: TEE WITHOUT CARDIOVERSION: SHX5443

## 2019-03-01 HISTORY — PX: CARDIOVERSION: SHX1299

## 2019-03-01 LAB — BASIC METABOLIC PANEL
ANION GAP: 12 (ref 5–15)
BUN: 67 mg/dL — ABNORMAL HIGH (ref 8–23)
CO2: 21 mmol/L — ABNORMAL LOW (ref 22–32)
Calcium: 8.9 mg/dL (ref 8.9–10.3)
Chloride: 108 mmol/L (ref 98–111)
Creatinine, Ser: 1.78 mg/dL — ABNORMAL HIGH (ref 0.61–1.24)
GFR calc Af Amer: 37 mL/min — ABNORMAL LOW (ref >60)
GFR calc non Af Amer: 32 mL/min — ABNORMAL LOW (ref >60)
Glucose, Bld: 126 mg/dL — ABNORMAL HIGH (ref 70–99)
Potassium: 4.4 mmol/L (ref 3.5–5.1)
Sodium: 141 mmol/L (ref 135–145)

## 2019-03-01 SURGERY — ECHOCARDIOGRAM, TRANSESOPHAGEAL
Anesthesia: Monitor Anesthesia Care

## 2019-03-01 MED ORDER — FUROSEMIDE 40 MG PO TABS
40.0000 mg | ORAL_TABLET | Freq: Every day | ORAL | Status: DC
  Administered 2019-03-01: 40 mg via ORAL
  Filled 2019-03-01: qty 1

## 2019-03-01 MED ORDER — PROPOFOL 10 MG/ML IV BOLUS
INTRAVENOUS | Status: DC | PRN
  Administered 2019-03-01 (×2): 10 mg via INTRAVENOUS
  Administered 2019-03-01: 20 mg via INTRAVENOUS

## 2019-03-01 MED ORDER — SODIUM CHLORIDE 0.9 % IV SOLN
INTRAVENOUS | Status: DC | PRN
  Administered 2019-03-01: 09:00:00 via INTRAVENOUS

## 2019-03-01 MED ORDER — PROPOFOL 500 MG/50ML IV EMUL
INTRAVENOUS | Status: DC | PRN
  Administered 2019-03-01: 50 ug/kg/min via INTRAVENOUS

## 2019-03-01 NOTE — Plan of Care (Signed)
  Problem: Education: Goal: Knowledge of cardiac device and self-care will improve Outcome: Progressing   Problem: Cardiac: Goal: Ability to achieve and maintain adequate cardiopulmonary perfusion will improve Outcome: Progressing   Problem: Education: Goal: Knowledge of General Education information will improve Description Including pain rating scale, medication(s)/side effects and non-pharmacologic comfort measures Outcome: Progressing   Problem: Activity: Goal: Risk for activity intolerance will decrease Outcome: Progressing   Problem: Pain Managment: Goal: General experience of comfort will improve Outcome: Progressing   Problem: Skin Integrity: Goal: Risk for impaired skin integrity will decrease Outcome: Progressing

## 2019-03-01 NOTE — Progress Notes (Signed)
    Transesophageal Echocardiogram Note  NAHSIR VENEZIA [id number] July 22, 1923  Procedure: Transesophageal Echocardiogram Indications: atrial fibrillation  Procedure Details Consent: Obtained Time Out: Verified patient identification, verified procedure, site/side was marked, verified correct patient position, special equipment/implants available, Radiology Safety Procedures followed,  medications/allergies/relevent history reviewed, required imaging and test results available.  Performed  Medications:  Pt sedated by anesthesia with diprovan 75 mg IV.  Normal LV function; no LAA thrombus.  Pt subsequently had DCCV with 120J to sinus with ventricular pacing; no immediate complications; continue xarelto.   Complications: No apparent complications Patient did tolerate procedure well.  Kirk Ruths, MD

## 2019-03-01 NOTE — Progress Notes (Addendum)
Progress Note  Patient Name: Andre [name] Date of Encounter: 03/01/2019  Primary Cardiologist: Kirk Ruths, MD   Subjective   S/p TEE/DCCV, no complaints  Inpatient Medications    Scheduled Meds: . amLODipine  10 mg Oral Daily  . atorvastatin  10 mg Oral Daily  . carvedilol  3.125 mg Oral BID WC  . diphenhydrAMINE  25 mg Oral QHS  . furosemide  40 mg Oral Daily  . mouth rinse  15 mL Mouth Rinse BID  . rivaroxaban  15 mg Oral Q supper  . sodium chloride flush  3 mL Intravenous Q12H  . sodium chloride flush  3 mL Intravenous Q12H   Continuous Infusions: . sodium chloride    . sodium chloride     PRN Meds: sodium chloride, acetaminophen, hydrocortisone cream, ondansetron (ZOFRAN) IV, sodium chloride flush, sodium chloride flush   Vital Signs    Vitals:   03/01/19 0950 03/01/19 0955 03/01/19 1000 03/01/19 1026  BP: (!) 172/66  (!) 163/63 (!) 158/66  Pulse: 77 78 84 92  Resp: 14 (!) 22 (!) 22 20  Temp:    97.9 F (36.6 C)  TempSrc:    Oral  SpO2: 91% 92% 94% (!) 87%  Weight:      Height:        Intake/Output Summary (Last 24 hours) at 03/01/2019 1644 Last data filed at 03/01/2019 1529 Gross per 24 hour  Intake 998.54 ml  Output 1700 ml  Net -701.46 ml   Last 3 Weights 03/01/2019 03/01/2019 02/28/2019  Weight (lbs) 153 lb 153 lb 152 lb  Weight (kg) 69.4 kg 69.4 kg 68.947 kg      Telemetry    AFlutter rate controlled, more often pacing now - Personally Reviewed intrinsic conduction   ECG    No new EKGs - Personally Reviewed  Physical Exam   GEN: No acute distress.   Neck: JVP 8 Cardiac: RRR, no murmurs, rubs, or gallops.  Respiratory: diminished at the bases, no crackles GI: Soft, nontender, non-distended  MS: trace edema; age appropriate atrophy Neuro:  Nonfocal  Psych: Normal affect   Labs    Chemistry Recent Labs  Lab 02/27/19 0327 02/28/19 0434 03/01/19 0747  NA 138 137 141  K 3.4* 3.3* 4.4  CL 104 104 108  CO2 25 22 21*   GLUCOSE 132* 125* 126*  BUN 72* 65* 67*  CREATININE 1.81* 1.66* 1.78*  CALCIUM 8.4* 8.5* 8.9  GFRNONAA 32* 35* 32*  GFRAA 37* 41* 37*  ANIONGAP 9 11 12      Hematology Recent Labs  Lab 02/23/19 0832 02/26/19 1305  WBC 14.7* 11.7*  RBC 3.90* 3.91*  HGB 12.6* 12.4*  HCT 36.6* 36.4*  MCV 93.8 93.1  MCH 32.3 31.7  MCHC 34.4 34.1  RDW 13.1 13.1  PLT 256 265    Cardiac EnzymesNo results for input(s): TROPONINI in the last 168 hours. No results for input(s): TROPIPOC in the last 168 hours.   BNP Recent Labs  Lab 02/23/19 0833  BNP 1,628.2*     DDimer No results for input(s): DDIMER in the last 168 hours.   Radiology    Dg Chest Port 1 View Result Date: 02/25/2019 CLINICAL DATA:  Status post pacemaker placement EXAM: PORTABLE CHEST 1 VIEW COMPARISON:  02/23/2019 FINDINGS: Cardiac shadow remains enlarged. Pacing device is now seen. No pneumothorax is noted. Vascular congestion is noted increased from the prior exam with increasing interstitial edema. Small right pleural effusion is noted. No focal confluent  infiltrate is seen. IMPRESSION: Increasing vascular congestion with interstitial edema. No pneumothorax following pacemaker placement. Electronically Signed   By: Inez Catalina M.D.   On: 02/25/2019 07:18    Cardiac Studies   02/24/2019: TTE IMPRESSIONs  1. The left ventricle has normal systolic function with an ejection fraction of 60-65%. The cavity size was normal. Left ventricular diastolic Doppler parameters are indeterminate.  2. The right ventricle has normal systolic function. The cavity was normal. There is no increase in right ventricular wall thickness.  3. No pericardial effusion seen, though image quality is limited.   TEE: 05/2016 StudyConclusions - Left ventricle: The cavity size was normal. Wall thickness was normal. Systolic function was normal. The estimated ejection fraction was in the range of 55% to 60%. Wall motion was normal; there were no  regional wall motion abnormalities. No evidence of thrombus. - Mitral valve: There was mild regurgitation directed centrally. - Left atrium: The atrium was mildly to moderately dilated. No evidence of thrombus in the atrial cavity or appendage. There was mildintermittent spontaneous echo contrast (&quot;smoke&quot;) in the appendage. Emptying velocity was reduced. - Tricuspid valve: There was mild-moderate regurgitation directed centrally. Impressions: - Successful cardioversion. No cardiac source of emboli was indentified.  Patient Profile     85 y.o. male with past medical history of paroxysmal atrial fibrillation (s/p DCCV in 2017,on Xarelto), chronic diastolic CHF (EF 10-93% by TEE in 05/2016), HTN, HLD, and PAD (known aortoiliac disease with medical therapy recommended) who presented to [facility] ED for evaluation of worsening dyspnea. Initial EKG showing CHB.  Assessment & Plan    1. Complete Heart Block Is s/p PPM implant Routine follow up is in place POD 1 CXR without ptx  2. Acute on Chronic Diastolic CHF Exacerbation He is diuresing Yesterday finally fluid neg -502 Cumulatively neg now -3259ml Weights less clear, looks down 7lbs  CXR yesterday with continued CHF IV diuresis continues Still required O2 yesterday with ambulation Hopefully SR will aid in diuresis Maybe home tomorrw?   3. Paroxysmal Atrial Fibrillation, flutter CHA2DS2Vasc is at least 4, started on Xarelto (x2 doses in) Pocket looks OK, xarelto started last night  s/p TEE/DCCV in SR  4. HLD on Atorvastatin 10mg  daily.   5. Acute on Chronic Stage 3 CKD PTA ARB held, likle will keep off going forward Up a bit today 67/1.78 Change to PO lasix daily  6. Hemoptysis     I note bloody sputum in his suction tubing.     He reported that every morning for YEARS has this    He reports significant post nasal drainage/irritation and for the first couple hours of every day, he has a  productive cough, almost always dark brown, with blood.  This self resolves by mid morning.    H/H was ok, WBC down 11.7     For questions or updates, please contact [place] Please consult [email or web address] for contact info under        Signed, Virl Axe, MD  03/01/2019, 4:44 PM    Pt has restored normal rhythm--hopefully home in am   May need home O2 not set up yet  Check renal function in am  Changed lasix IV>>PO  Will do wound check prior to discharge (WC --Thx)

## 2019-03-01 NOTE — Progress Notes (Signed)
02 off. Pt up to bathroom and sat is 62% on RA.  02 Reapplied @ 2/LNC

## 2019-03-01 NOTE — Anesthesia Preprocedure Evaluation (Addendum)
Anesthesia Evaluation  Patient identified by MRN, date of birth, ID band Patient awake    Reviewed: Allergy & Precautions, NPO status , Patient's Chart, lab work & pertinent test results  Airway Mallampati: II  TM Distance: >3 FB Neck ROM: Full    Dental no notable dental hx. (+) Poor Dentition, Missing, Chipped, Dental Advisory Given   Pulmonary former smoker,    Pulmonary exam normal breath sounds clear to auscultation       Cardiovascular hypertension, Pt. on medications +CHF  Normal cardiovascular exam+ dysrhythmias Atrial Fibrillation  Rhythm:Regular Rate:Normal  02/24/2019 Echo  1. The left ventricle has normal systolic function with an ejection fraction of 60-65%. The cavity size was normal. Left ventricular diastolic Doppler parameters are indeterminate.   Neuro/Psych negative neurological ROS  negative psych ROS   GI/Hepatic negative GI ROS, Neg liver ROS,   Endo/Other  negative endocrine ROS  Renal/GU Renal Insufficiency and CRFRenal diseaseK+ 3.3 Cr 1.66     Musculoskeletal negative musculoskeletal ROS (+)   Abdominal   Peds  Hematology Hgb 12.4   Anesthesia Other Findings   Reproductive/Obstetrics                          Anesthesia Physical Anesthesia Plan  ASA: III  Anesthesia Plan: MAC   Post-op Pain Management:    Induction: Intravenous  PONV Risk Score and Plan: Treatment may vary due to age or medical condition  Airway Management Planned: Nasal Cannula and Natural Airway  Additional Equipment:   Intra-op Plan:   Post-operative Plan:   Informed Consent:   Plan Discussed with:   Anesthesia Plan Comments:         Anesthesia Quick Evaluation

## 2019-03-01 NOTE — Progress Notes (Signed)
  Echocardiogram Echocardiogram Transesophageal has been performed.  Andre [name] 03/01/2019, 9:55 AM

## 2019-03-01 NOTE — Interval H&P Note (Signed)
History and Physical Interval Note:  03/01/2019 7:58 AM  Andre [name]  has presented today for surgery, with the diagnosis of atrial flutter.  The various methods of treatment have been discussed with the patient and family. After consideration of risks, benefits and other options for treatment, the patient has consented to  Procedure(s): TRANSESOPHAGEAL ECHOCARDIOGRAM (TEE) (N/A) CARDIOVERSION (N/A) as a surgical intervention.  The patient's history has been reviewed, patient examined, no change in status, stable for surgery.  I have reviewed the patient's chart and labs.  Questions were answered to the patient's satisfaction.     Kirk Ruths

## 2019-03-01 NOTE — Anesthesia Postprocedure Evaluation (Signed)
Anesthesia Post Note  Patient: Andre [name]  Procedure(s) Performed: TRANSESOPHAGEAL ECHOCARDIOGRAM (TEE) (N/A ) CARDIOVERSION (N/A )     Patient location during evaluation: Endoscopy Anesthesia Type: MAC Level of consciousness: awake and alert Pain management: pain level controlled Vital Signs Assessment: post-procedure vital signs reviewed and stable Respiratory status: spontaneous breathing, nonlabored ventilation, respiratory function stable and patient connected to nasal cannula oxygen Cardiovascular status: blood pressure returned to baseline and stable Postop Assessment: no apparent nausea or vomiting Anesthetic complications: no    Last Vitals:  Vitals:   03/01/19 1000 03/01/19 1026  BP: (!) 163/63 (!) 158/66  Pulse: 84 92  Resp: (!) 22 20  Temp:  36.6 C  SpO2: 94% (!) 87%    Last Pain:  Vitals:   03/01/19 1026  TempSrc: Oral  PainSc:                  Barnet [place]

## 2019-03-01 NOTE — Progress Notes (Signed)
Off unit via wheelchair for TEE.

## 2019-03-01 NOTE — Transfer of Care (Signed)
Immediate Anesthesia Transfer of Care Note  Patient: Andre [name]  Procedure(s) Performed: TRANSESOPHAGEAL ECHOCARDIOGRAM (TEE) (N/A ) CARDIOVERSION (N/A )  Patient Location: Endoscopy Unit  Anesthesia Type:MAC  Level of Consciousness: awake, alert  and oriented  Airway & Oxygen Therapy: Patient Spontanous Breathing and Patient connected to nasal cannula oxygen  Post-op Assessment: Report given to RN  Post vital signs: Reviewed and stable  Last Vitals:  Vitals Value Taken Time  BP 134/45 03/01/2019  9:31 AM  Temp    Pulse 78 03/01/2019  9:32 AM  Resp 22 03/01/2019  9:32 AM  SpO2 91 % 03/01/2019  9:32 AM  Vitals shown include unvalidated device data.  Last Pain:  Vitals:   03/01/19 0812  TempSrc: Oral  PainSc: 0-No pain         Complications: No apparent anesthesia complications

## 2019-03-02 ENCOUNTER — Inpatient Hospital Stay (HOSPITAL_COMMUNITY): Payer: Medicare Other

## 2019-03-02 ENCOUNTER — Encounter (HOSPITAL_COMMUNITY): Payer: Self-pay | Admitting: Cardiology

## 2019-03-02 LAB — BASIC METABOLIC PANEL WITH GFR
Anion gap: 10 (ref 5–15)
BUN: 56 mg/dL — ABNORMAL HIGH (ref 8–23)
CO2: 23 mmol/L (ref 22–32)
Calcium: 8.5 mg/dL — ABNORMAL LOW (ref 8.9–10.3)
Chloride: 107 mmol/L (ref 98–111)
Creatinine, Ser: 1.58 mg/dL — ABNORMAL HIGH (ref 0.61–1.24)
GFR calc Af Amer: 43 mL/min — ABNORMAL LOW
GFR calc non Af Amer: 37 mL/min — ABNORMAL LOW
Glucose, Bld: 124 mg/dL — ABNORMAL HIGH (ref 70–99)
Potassium: 4 mmol/L (ref 3.5–5.1)
Sodium: 140 mmol/L (ref 135–145)

## 2019-03-02 MED ORDER — IPRATROPIUM-ALBUTEROL 0.5-2.5 (3) MG/3ML IN SOLN: 3.0000 mL | Freq: Four times a day (QID) | RESPIRATORY_TRACT | Status: DC

## 2019-03-02 MED ORDER — FUROSEMIDE 10 MG/ML IJ SOLN
40.0000 mg | Freq: Once | INTRAMUSCULAR | Status: AC
  Administered 2019-03-02: 40 mg via INTRAVENOUS
  Filled 2019-03-02: qty 4

## 2019-03-02 MED ORDER — RAMELTEON 8 MG PO TABS
8.0000 mg | ORAL_TABLET | Freq: Every day | ORAL | Status: DC
  Administered 2019-03-02 – 2019-03-11 (×9): 8 mg via ORAL
  Filled 2019-03-02 (×10): qty 1

## 2019-03-02 MED ORDER — IPRATROPIUM-ALBUTEROL 0.5-2.5 (3) MG/3ML IN SOLN
3.0000 mL | Freq: Once | RESPIRATORY_TRACT | Status: AC
  Administered 2019-03-02: 3 mL via RESPIRATORY_TRACT
  Filled 2019-03-02: qty 3

## 2019-03-02 NOTE — Progress Notes (Signed)
Progress Note  Patient Name: Andre [name] Date of Encounter: 03/02/2019  Primary Cardiologist: Kirk Ruths, MD   Subjective   TEE/cardioversion performed yesterday.  Patient developed pulmonary edema overnight.  Received a dose of IV Lasix.  Oxygen saturations dropped and required 6 L.  Is currently having saturations in the low 90s.  Inpatient Medications    Scheduled Meds: . amLODipine  10 mg Oral Daily  . atorvastatin  10 mg Oral Daily  . carvedilol  3.125 mg Oral BID WC  . mouth rinse  15 mL Mouth Rinse BID  . rivaroxaban  15 mg Oral Q supper  . sodium chloride flush  3 mL Intravenous Q12H  . sodium chloride flush  3 mL Intravenous Q12H   Continuous Infusions: . sodium chloride    . sodium chloride     PRN Meds: sodium chloride, acetaminophen, hydrocortisone cream, ondansetron (ZOFRAN) IV, sodium chloride flush, sodium chloride flush   Vital Signs    Vitals:   03/02/19 0500 03/02/19 0514 03/02/19 0900 03/02/19 0912  BP:  (!) 166/69  (!) 110/95  Pulse:  (!) 103    Resp:  18  18  Temp:  98 F (36.7 C)    TempSrc:  Oral    SpO2:  92% (!) 84% 94%  Weight: 68.5 kg     Height:        Intake/Output Summary (Last 24 hours) at 03/02/2019 0934 Last data filed at 03/02/2019 1610 Gross per 24 hour  Intake 1586 ml  Output 1400 ml  Net 186 ml   Last 3 Weights 03/02/2019 03/01/2019 03/01/2019  Weight (lbs) 151 lb 0.2 oz 151 lb 1.6 oz 153 lb  Weight (kg) 68.5 kg 68.539 kg 69.4 kg      Telemetry    Sinus tachycardia  ECG    Sinus tachycardia with ventricular pacing- Personally Reviewed  Physical Exam   GEN: Well nourished, well developed, in no acute distress  HEENT: normal  Neck: no JVD, carotid bruits, or masses Cardiac: RRR; no murmurs, rubs, or gallops,no edema  Respiratory: Crackles bilaterally at the bases GI: soft, nontender, nondistended, + BS MS: no deformity or atrophy  Skin: warm and dry, device site well healed Neuro:  Strength and  sensation are intact Psych: euthymic mood, full affect   Labs    Chemistry Recent Labs  Lab 02/28/19 0434 03/01/19 0747 03/02/19 0249  NA 137 141 140  K 3.3* 4.4 4.0  CL 104 108 107  CO2 22 21* 23  GLUCOSE 125* 126* 124*  BUN 65* 67* 56*  CREATININE 1.66* 1.78* 1.58*  CALCIUM 8.5* 8.9 8.5*  GFRNONAA 35* 32* 37*  GFRAA 41* 37* 43*  ANIONGAP 11 12 10      Hematology Recent Labs  Lab 02/26/19 1305  WBC 11.7*  RBC 3.91*  HGB 12.4*  HCT 36.4*  MCV 93.1  MCH 31.7  MCHC 34.1  RDW 13.1  PLT 265    Cardiac EnzymesNo results for input(s): TROPONINI in the last 168 hours. No results for input(s): TROPIPOC in the last 168 hours.   BNP No results for input(s): BNP, PROBNP in the last 168 hours.   DDimer No results for input(s): DDIMER in the last 168 hours.   Radiology    Dg Chest Port 1 View Result Date: 02/25/2019 CLINICAL DATA:  Status post pacemaker placement EXAM: PORTABLE CHEST 1 VIEW COMPARISON:  02/23/2019 FINDINGS: Cardiac shadow remains enlarged. Pacing device is now seen. No pneumothorax is noted. Vascular congestion  is noted increased from the prior exam with increasing interstitial edema. Small right pleural effusion is noted. No focal confluent infiltrate is seen. IMPRESSION: Increasing vascular congestion with interstitial edema. No pneumothorax following pacemaker placement. Electronically Signed   By: Inez Catalina M.D.   On: 02/25/2019 07:18    Cardiac Studies   02/24/2019: TTE IMPRESSIONs  1. The left ventricle has normal systolic function with an ejection fraction of 60-65%. The cavity size was normal. Left ventricular diastolic Doppler parameters are indeterminate.  2. The right ventricle has normal systolic function. The cavity was normal. There is no increase in right ventricular wall thickness.  3. No pericardial effusion seen, though image quality is limited.   TEE: 05/2016 StudyConclusions - Left ventricle: The cavity size was normal. Wall  thickness was normal. Systolic function was normal. The estimated ejection fraction was in the range of 55% to 60%. Wall motion was normal; there were no regional wall motion abnormalities. No evidence of thrombus. - Mitral valve: There was mild regurgitation directed centrally. - Left atrium: The atrium was mildly to moderately dilated. No evidence of thrombus in the atrial cavity or appendage. There was mildintermittent spontaneous echo contrast (&quot;smoke&quot;) in the appendage. Emptying velocity was reduced. - Tricuspid valve: There was mild-moderate regurgitation directed centrally. Impressions: - Successful cardioversion. No cardiac source of emboli was indentified.  Patient Profile     85 y.o. male with past medical history of paroxysmal atrial fibrillation (s/p DCCV in 2017,on Xarelto), chronic diastolic CHF (EF 12-24% by TEE in 05/2016), HTN, HLD, and PAD (known aortoiliac disease with medical therapy recommended) who presented to [facility] ED for evaluation of worsening dyspnea. Initial EKG showing CHB.  Assessment & Plan    1. Complete Heart Block Post pacemaker implantation.  Chest x-ray is without pneumothorax.  2. Acute on Chronic Diastolic CHF Exacerbation Continuing to diurese, though did develop pulmonary edema after cardioversion.  Received a dose of IV Lasix this morning.  Marca Gadsby re-dose around 12:00 this afternoon.  Hopefully Sevon Rotert be able to be discharged tomorrow.   3. Paroxysmal Atrial Fibrillation, flutter Status post TEE and cardioversion.  Continue Xarelto.  In sinus rhythm.  This patients CHA2DS2-VASc Score and unadjusted Ischemic Stroke Rate (% per year) is equal to 4.8 % stroke rate/year from a score of 4  Above score calculated as 1 point each if present [CHF, HTN, DM, Vascular=MI/PAD/Aortic Plaque, Age if 65-74, or Male] Above score calculated as 2 points each if present [Age > 75, or Stroke/TIA/TE]   4. HLD Continue  atorvastatin  5. Acute on Chronic Stage 3 CKD  creatinine improving.  6. Hemoptysis Has been going on for many years.  Hemoglobin stable.    For questions or updates, please contact [place] Please consult [email or web address] for contact info under        Signed, Rakhi Romagnoli Meredith Leeds, MD  03/02/2019, 9:34 AM

## 2019-03-02 NOTE — Progress Notes (Signed)
Patient's condom cath did come off at one point today, potentially affecting UOP measurement while on commode for BM.

## 2019-03-02 NOTE — Progress Notes (Addendum)
Patient  with labored breathing this morning. Expiratory wheezes heard on auscultation. Increased 02 to 4L [place] from 2L. Sat 92.  Cardiologist on call notified. Order to give x1 duoneb and give 40mg  IV lasix now. Patient up in chair. No acute distress. Will continue to monitor.

## 2019-03-02 NOTE — Progress Notes (Signed)
Patient resting comfortably during shift report. Denies complaints.  

## 2019-03-03 ENCOUNTER — Inpatient Hospital Stay (HOSPITAL_COMMUNITY): Payer: Medicare Other

## 2019-03-03 LAB — COMPREHENSIVE METABOLIC PANEL
ALT: 25 U/L (ref 0–44)
AST: 26 U/L (ref 15–41)
Albumin: 2.4 g/dL — ABNORMAL LOW (ref 3.5–5.0)
Alkaline Phosphatase: 50 U/L (ref 38–126)
Anion gap: 10 (ref 5–15)
BUN: 54 mg/dL — ABNORMAL HIGH (ref 8–23)
CO2: 25 mmol/L (ref 22–32)
Calcium: 8.7 mg/dL — ABNORMAL LOW (ref 8.9–10.3)
Chloride: 106 mmol/L (ref 98–111)
Creatinine, Ser: 1.57 mg/dL — ABNORMAL HIGH (ref 0.61–1.24)
GFR calc Af Amer: 43 mL/min — ABNORMAL LOW (ref >60)
GFR calc non Af Amer: 37 mL/min — ABNORMAL LOW (ref >60)
Glucose, Bld: 148 mg/dL — ABNORMAL HIGH (ref 70–99)
POTASSIUM: 3.8 mmol/L (ref 3.5–5.1)
Sodium: 141 mmol/L (ref 135–145)
Total Bilirubin: 0.8 mg/dL (ref 0.3–1.2)
Total Protein: 6.6 g/dL (ref 6.5–8.1)

## 2019-03-03 LAB — CBC WITH DIFFERENTIAL/PLATELET
Abs Immature Granulocytes: 0.08 10*3/uL — ABNORMAL HIGH (ref 0.00–0.07)
BASOS ABS: 0 10*3/uL (ref 0.0–0.1)
Basophils Relative: 0 %
Eosinophils Absolute: 0.1 10*3/uL (ref 0.0–0.5)
Eosinophils Relative: 1 %
HCT: 32.8 % — ABNORMAL LOW (ref 39.0–52.0)
Hemoglobin: 10.5 g/dL — ABNORMAL LOW (ref 13.0–17.0)
Immature Granulocytes: 1 %
Lymphocytes Relative: 5 %
Lymphs Abs: 0.6 10*3/uL — ABNORMAL LOW (ref 0.7–4.0)
MCH: 30.1 pg (ref 26.0–34.0)
MCHC: 32 g/dL (ref 30.0–36.0)
MCV: 94 fL (ref 80.0–100.0)
MONO ABS: 0.8 10*3/uL (ref 0.1–1.0)
MONOS PCT: 6 %
Neutro Abs: 11.2 10*3/uL — ABNORMAL HIGH (ref 1.7–7.7)
Neutrophils Relative %: 87 %
PLATELETS: 299 10*3/uL (ref 150–400)
RBC: 3.49 MIL/uL — ABNORMAL LOW (ref 4.22–5.81)
RDW: 12.8 % (ref 11.5–15.5)
WBC: 12.8 10*3/uL — ABNORMAL HIGH (ref 4.0–10.5)
nRBC: 0 % (ref 0.0–0.2)

## 2019-03-03 LAB — MAGNESIUM: MAGNESIUM: 2.2 mg/dL (ref 1.7–2.4)

## 2019-03-03 MED ORDER — TRAZODONE HCL 50 MG PO TABS
25.0000 mg | ORAL_TABLET | Freq: Once | ORAL | Status: AC
  Administered 2019-03-03: 25 mg via ORAL
  Filled 2019-03-03: qty 1

## 2019-03-03 MED ORDER — FUROSEMIDE 10 MG/ML IJ SOLN
80.0000 mg | Freq: Once | INTRAMUSCULAR | Status: AC
  Administered 2019-03-03: 80 mg via INTRAVENOUS
  Filled 2019-03-03: qty 8

## 2019-03-03 NOTE — Progress Notes (Signed)
Progress Note  Patient Name: Andre [name] Date of Encounter: 03/03/2019  Primary Cardiologist: Kirk Ruths, MD   Subjective   Patient was cardioverted on Friday.  He remains in normal rhythm.  He is unfortunately continued to have issues with pulmonary edema.  He received 2 doses of IV Lasix yesterday, but overnight his oxygen saturations dropped and he is required more Lasix overnight.  He was moved to [place] due to his oxygen saturations being low and some agitation.   Inpatient Medications    Scheduled Meds: . amLODipine  10 mg Oral Daily  . atorvastatin  10 mg Oral Daily  . carvedilol  3.125 mg Oral BID WC  . mouth rinse  15 mL Mouth Rinse BID  . ramelteon  8 mg Oral QHS  . rivaroxaban  15 mg Oral Q supper  . sodium chloride flush  3 mL Intravenous Q12H  . sodium chloride flush  3 mL Intravenous Q12H   Continuous Infusions: . sodium chloride    . sodium chloride     PRN Meds: sodium chloride, acetaminophen, hydrocortisone cream, ondansetron (ZOFRAN) IV, sodium chloride flush, sodium chloride flush   Vital Signs    Vitals:   03/03/19 0541 03/03/19 0628 03/03/19 0700 03/03/19 0757  BP: (!) 150/87 (!) 159/65 (!) 178/64   Pulse: 90 74 91   Resp:  (!) 24 18   Temp: 97.7 F (36.5 C)   97.7 F (36.5 C)  TempSrc: Oral   Axillary  SpO2: 100% (!) 87% 98%   Weight: 68.8 kg     Height: 5\' 5"  (1.651 m)       Intake/Output Summary (Last 24 hours) at 03/03/2019 3016 Last data filed at 03/03/2019 0400 Gross per 24 hour  Intake 1200 ml  Output 1900 ml  Net -700 ml   Last 3 Weights 03/03/2019 03/03/2019 03/02/2019  Weight (lbs) 151 lb 10.8 oz 150 lb 4.8 oz 151 lb 0.2 oz  Weight (kg) 68.8 kg 68.176 kg 68.5 kg      Telemetry    Sinus rhythm  ECG    None new- Personally Reviewed  Physical Exam   GEN: Well nourished, well developed, in no acute distress  HEENT: normal  Neck: no JVD, carotid bruits, or masses Cardiac: RRR; no murmurs, rubs, or gallops,no edema   Respiratory: Crackles throughout GI: soft, nontender, nondistended, + BS MS: no deformity or atrophy  Skin: warm and dry, device site well healed Neuro:  Strength and sensation are intact Psych: euthymic mood, full affect    Labs    Chemistry Recent Labs  Lab 03/01/19 0747 03/02/19 0249 03/03/19 0634  NA 141 140 141  K 4.4 4.0 3.8  CL 108 107 106  CO2 21* 23 25  GLUCOSE 126* 124* 148*  BUN 67* 56* 54*  CREATININE 1.78* 1.58* 1.57*  CALCIUM 8.9 8.5* 8.7*  PROT  --   --  6.6  ALBUMIN  --   --  2.4*  AST  --   --  26  ALT  --   --  25  ALKPHOS  --   --  50  BILITOT  --   --  0.8  GFRNONAA 32* 37* 37*  GFRAA 37* 43* 43*  ANIONGAP 12 10 10      Hematology Recent Labs  Lab 02/26/19 1305 03/03/19 0634  WBC 11.7* 12.8*  RBC 3.91* 3.49*  HGB 12.4* 10.5*  HCT 36.4* 32.8*  MCV 93.1 94.0  MCH 31.7 30.1  MCHC 34.1  32.0  RDW 13.1 12.8  PLT 265 299    Cardiac EnzymesNo results for input(s): TROPONINI in the last 168 hours. No results for input(s): TROPIPOC in the last 168 hours.   BNP No results for input(s): BNP, PROBNP in the last 168 hours.   DDimer No results for input(s): DDIMER in the last 168 hours.   Radiology    Dg Chest Port 1 View Result Date: 02/25/2019 CLINICAL DATA:  Status post pacemaker placement EXAM: PORTABLE CHEST 1 VIEW COMPARISON:  02/23/2019 FINDINGS: Cardiac shadow remains enlarged. Pacing device is now seen. No pneumothorax is noted. Vascular congestion is noted increased from the prior exam with increasing interstitial edema. Small right pleural effusion is noted. No focal confluent infiltrate is seen. IMPRESSION: Increasing vascular congestion with interstitial edema. No pneumothorax following pacemaker placement. Electronically Signed   By: Inez Catalina M.D.   On: 02/25/2019 07:18    Cardiac Studies   02/24/2019: TTE IMPRESSIONs  1. The left ventricle has normal systolic function with an ejection fraction of 60-65%. The cavity size was  normal. Left ventricular diastolic Doppler parameters are indeterminate.  2. The right ventricle has normal systolic function. The cavity was normal. There is no increase in right ventricular wall thickness.  3. No pericardial effusion seen, though image quality is limited.   TEE: 05/2016 StudyConclusions - Left ventricle: The cavity size was normal. Wall thickness was normal. Systolic function was normal. The estimated ejection fraction was in the range of 55% to 60%. Wall motion was normal; there were no regional wall motion abnormalities. No evidence of thrombus. - Mitral valve: There was mild regurgitation directed centrally. - Left atrium: The atrium was mildly to moderately dilated. No evidence of thrombus in the atrial cavity or appendage. There was mildintermittent spontaneous echo contrast (&quot;smoke&quot;) in the appendage. Emptying velocity was reduced. - Tricuspid valve: There was mild-moderate regurgitation directed centrally. Impressions: - Successful cardioversion. No cardiac source of emboli was indentified.  Patient Profile     85 y.o. male with past medical history of paroxysmal atrial fibrillation (s/p DCCV in 2017,on Xarelto), chronic diastolic CHF (EF 52-84% by TEE in 05/2016), HTN, HLD, and PAD (known aortoiliac disease with medical therapy recommended) who presented to [facility] ED for evaluation of worsening dyspnea. Initial EKG showing CHB.  Assessment & Plan    1. Complete Heart Block Status post pacemaker implant.  Device appears to be functioning appropriately.  2. Acute on Chronic Diastolic CHF Exacerbation Is out 4.1 l since admission.  It is curious that he continues to have issues with pulmonary edema.  I Jadin Creque continue to diurese him throughout the day today, but should he continue to have issues, he may warrant a discussion with the heart failure team.  It is unclear to me why he would continue to have pulmonary edema despite  being in normal rhythm.  3. Paroxysmal Atrial Fibrillation, flutter Status post cardioversion.  Currently on Xarelto.  In sinus rhythm.  This patients CHA2DS2-VASc Score and unadjusted Ischemic Stroke Rate (% per year) is equal to 4.8 % stroke rate/year from a score of 4  Above score calculated as 1 point each if present [CHF, HTN, DM, Vascular=MI/PAD/Aortic Plaque, Age if 65-74, or Male] Above score calculated as 2 points each if present [Age > 75, or Stroke/TIA/TE]   4. HLD Continue atorvastatin  5. Acute on Chronic Stage 3 CKD  creatinine is stabilized  6. Hemoptysis Hemoglobin has remained stable.  This is been ongoing for many years.  For questions or updates, please contact [place] Please consult [email or web address] for contact info under        Signed, Kambri Dismore Meredith Leeds, MD  03/03/2019, 8:12 AM

## 2019-03-03 NOTE — Progress Notes (Signed)
Patient with episode of coughing and still on nonrebreather with sats now in mid 90s.  Still unable to wean back to nasal cannula.  MD gave orders to transfer to [facility].

## 2019-03-03 NOTE — Progress Notes (Signed)
Patient diuresing well from lasix, he feels like he is coughing less than yesterday. Decreased o2 to 5 L. Has hydration on o2.  Listening to audio books on tape. No complaints of pain. Or discomfort. Resting in between activity. Daughter went home. We have contact information. She understands new vitiation rules.  She has our contact information to check in. She brought in his cell phone to use.

## 2019-03-03 NOTE — Progress Notes (Signed)
Spoke with daughter, Mechele Claude to give an update.  Informed daughter that patient is in 2H08 and provided with unit phone number.

## 2019-03-03 NOTE — Plan of Care (Signed)
Spoke with daughter and patient regarding potential need for home health and o2 at home. Checking daily weights blood pressure and oxygen levels and relaying them to nurse. Patient has macular degeneration and wife has memory loss. He has been taking care of her at home. He is not interested in extended care. He does not really want o2 at home, but understands he may need some assistance. Does have good support system, has a couple of children that live close by, but not with them.  Care management consulted.

## 2019-03-03 NOTE — Progress Notes (Signed)
   03/03/19 0200  Provider Notification  Provider Name/Title Charissa Bash, MD  Date Provider Notified 03/03/19  Time Provider Notified 0201  Notification Type Page  Notification Reason Change in status  Response See new orders  Date of Provider Response 03/03/19  Time of Provider Response 0206  Pt restless and found with sats in mid 70s on 6L nasal cannula.  Pt placed on nonrebreather and sats maintaining in upper 90s.  Paged Cardiology who ordered chest xray and 80 of IV lasix.

## 2019-03-04 DIAGNOSIS — N179 Acute kidney failure, unspecified: Secondary | ICD-10-CM

## 2019-03-04 DIAGNOSIS — N183 Chronic kidney disease, stage 3 (moderate): Secondary | ICD-10-CM

## 2019-03-04 LAB — BASIC METABOLIC PANEL
Anion gap: 11 (ref 5–15)
BUN: 44 mg/dL — ABNORMAL HIGH (ref 8–23)
CO2: 28 mmol/L (ref 22–32)
Calcium: 8.6 mg/dL — ABNORMAL LOW (ref 8.9–10.3)
Chloride: 100 mmol/L (ref 98–111)
Creatinine, Ser: 1.41 mg/dL — ABNORMAL HIGH (ref 0.61–1.24)
GFR calc Af Amer: 49 mL/min — ABNORMAL LOW (ref >60)
GFR calc non Af Amer: 43 mL/min — ABNORMAL LOW (ref >60)
GLUCOSE: 139 mg/dL — AB (ref 70–99)
Potassium: 3.6 mmol/L (ref 3.5–5.1)
Sodium: 139 mmol/L (ref 135–145)

## 2019-03-04 MED ORDER — POTASSIUM CHLORIDE CRYS ER 20 MEQ PO TBCR
40.0000 meq | EXTENDED_RELEASE_TABLET | Freq: Once | ORAL | Status: AC
  Administered 2019-03-04: 40 meq via ORAL
  Filled 2019-03-04: qty 2

## 2019-03-04 MED ORDER — FUROSEMIDE 10 MG/ML IJ SOLN
120.0000 mg | Freq: Two times a day (BID) | INTRAVENOUS | Status: DC
  Administered 2019-03-04 – 2019-03-06 (×5): 120 mg via INTRAVENOUS
  Filled 2019-03-04 (×4): qty 10
  Filled 2019-03-04: qty 12
  Filled 2019-03-04: qty 2

## 2019-03-04 MED ORDER — LEVALBUTEROL HCL 0.63 MG/3ML IN NEBU
0.6300 mg | INHALATION_SOLUTION | Freq: Three times a day (TID) | RESPIRATORY_TRACT | Status: DC
  Administered 2019-03-04 (×2): 0.63 mg via RESPIRATORY_TRACT
  Filled 2019-03-04 (×2): qty 3

## 2019-03-04 NOTE — Progress Notes (Signed)
Progress Note  Patient Name: Andre [name] Date of Encounter: 03/04/2019  Primary Cardiologist: Kirk Ruths, MD   Subjective   Events of weekend reviewed Feeling better than yesterday   Discussed events with [facility]    Inpatient Medications    Scheduled Meds: . amLODipine  10 mg Oral Daily  . atorvastatin  10 mg Oral Daily  . carvedilol  3.125 mg Oral BID WC  . levalbuterol  0.63 mg Nebulization Q8H  . mouth rinse  15 mL Mouth Rinse BID  . ramelteon  8 mg Oral QHS  . rivaroxaban  15 mg Oral Q supper  . sodium chloride flush  3 mL Intravenous Q12H  . sodium chloride flush  3 mL Intravenous Q12H   Continuous Infusions: . sodium chloride Stopped (03/04/19 1027)  . sodium chloride    . furosemide 62 mL/hr at 03/04/19 1100   PRN Meds: sodium chloride, acetaminophen, hydrocortisone cream, ondansetron (ZOFRAN) IV, sodium chloride flush, sodium chloride flush   Vital Signs    Vitals:   03/04/19 0900 03/04/19 1000 03/04/19 1100 03/04/19 1200  BP: (!) 149/73 (!) 169/65 (!) 178/60 (!) 163/53  Pulse: 82 77 72 75  Resp: 18 (!) 23 (!) 24 (!) 21  Temp:   98.5 F (36.9 C)   TempSrc:   Oral   SpO2: 95% 93% 95% 93%  Weight:      Height:        Intake/Output Summary (Last 24 hours) at 03/04/2019 1314 Last data filed at 03/04/2019 1100 Gross per 24 hour  Intake 753.8 ml  Output 2600 ml  Net -1846.2 ml   Last 3 Weights 03/04/2019 03/03/2019 03/03/2019  Weight (lbs) 148 lb 9.4 oz 151 lb 10.8 oz 150 lb 4.8 oz  Weight (kg) 67.4 kg 68.8 kg 68.176 kg      Telemetry    P-synchronous/ AV  pacing   ECG    No new EKGs - Personally Reviewed  Physical Exam   GEN: mild resp distress  RR 22.   Neck: JVP 8 Cardiac: RRR Respiratory: diffuse ronchi and basilar crackles  GI: Soft, nontender, non-distended  MS: trace edema; age appropriate atrophy Neuro:  Nonfocal  Psych: Normal affect   Labs    Chemistry Recent Labs  Lab 03/01/19 0747 03/02/19 0249 03/03/19 0634   NA 141 140 141  K 4.4 4.0 3.8  CL 108 107 106  CO2 21* 23 25  GLUCOSE 126* 124* 148*  BUN 67* 56* 54*  CREATININE 1.78* 1.58* 1.57*  CALCIUM 8.9 8.5* 8.7*  PROT  --   --  6.6  ALBUMIN  --   --  2.4*  AST  --   --  26  ALT  --   --  25  ALKPHOS  --   --  50  BILITOT  --   --  0.8  GFRNONAA 32* 37* 37*  GFRAA 37* 43* 43*  ANIONGAP 12 10 10      Hematology Recent Labs  Lab 02/26/19 1305 03/03/19 0634  WBC 11.7* 12.8*  RBC 3.91* 3.49*  HGB 12.4* 10.5*  HCT 36.4* 32.8*  MCV 93.1 94.0  MCH 31.7 30.1  MCHC 34.1 32.0  RDW 13.1 12.8  PLT 265 299    Cardiac EnzymesNo results for input(s): TROPONINI in the last 168 hours. No results for input(s): TROPIPOC in the last 168 hours.   BNP No results for input(s): BNP, PROBNP in the last 168 hours.   DDimer No results for  input(s): DDIMER in the last 168 hours.   Radiology    Dg Chest Port 1 View Result Date: 02/25/2019 CLINICAL DATA:  Status post pacemaker placement EXAM: PORTABLE CHEST 1 VIEW COMPARISON:  02/23/2019 FINDINGS: Cardiac shadow remains enlarged. Pacing device is now seen. No pneumothorax is noted. Vascular congestion is noted increased from the prior exam with increasing interstitial edema. Small right pleural effusion is noted. No focal confluent infiltrate is seen. IMPRESSION: Increasing vascular congestion with interstitial edema. No pneumothorax following pacemaker placement. Electronically Signed   By: Inez Catalina M.D.   On: 02/25/2019 07:18    Cardiac Studies   02/24/2019: TTE IMPRESSIONs  1. The left ventricle has normal systolic function with an ejection fraction of 60-65%. The cavity size was normal. Left ventricular diastolic Doppler parameters are indeterminate.  2. The right ventricle has normal systolic function. The cavity was normal. There is no increase in right ventricular wall thickness.  3. No pericardial effusion seen, though image quality is limited.   TEE: 05/2016 StudyConclusions - Left  ventricle: The cavity size was normal. Wall thickness was normal. Systolic function was normal. The estimated ejection fraction was in the range of 55% to 60%. Wall motion was normal; there were no regional wall motion abnormalities. No evidence of thrombus. - Mitral valve: There was mild regurgitation directed centrally. - Left atrium: The atrium was mildly to moderately dilated. No evidence of thrombus in the atrial cavity or appendage. There was mildintermittent spontaneous echo contrast (&quot;smoke&quot;) in the appendage. Emptying velocity was reduced. - Tricuspid valve: There was mild-moderate regurgitation directed centrally. Impressions: - Successful cardioversion. No cardiac source of emboli was indentified.  Patient Profile     85 y.o. male with past medical history of paroxysmal atrial fibrillation (s/p DCCV in 2017,on Xarelto), chronic diastolic CHF (EF 59-93% by TEE in 05/2016), HTN, HLD, and PAD (known aortoiliac disease with medical therapy recommended) who presented to [facility] ED for evaluation of worsening dyspnea. Initial EKG showing CHB.  Assessment & Plan    1. Complete Heart Block   2. Acute on Chronic Diastolic CHF Exacerbation  3. Paroxysmal Atrial Fibrillation, flutter   4. HLD   5. Acute on Chronic Stage 3 CKD      Respiratory decompensatoin with CXR consistent with worseing pulm edema   Not sure why  Continue aggressive diuresis;  afeb but w borderline WBC-- could be infectious, as I dont know why the over the weekend decompensation with a net neg 6 L   Will recheck CXR in am  Use inhaler today   Renal function is stable         Signed, Virl Axe, MD  03/04/2019, 1:14 PM

## 2019-03-05 LAB — CBC
HCT: 31.1 % — ABNORMAL LOW (ref 39.0–52.0)
Hemoglobin: 10.6 g/dL — ABNORMAL LOW (ref 13.0–17.0)
MCH: 31.8 pg (ref 26.0–34.0)
MCHC: 34.1 g/dL (ref 30.0–36.0)
MCV: 93.4 fL (ref 80.0–100.0)
Platelets: 396 10*3/uL (ref 150–400)
RBC: 3.33 MIL/uL — ABNORMAL LOW (ref 4.22–5.81)
RDW: 12.5 % (ref 11.5–15.5)
WBC: 10.8 10*3/uL — ABNORMAL HIGH (ref 4.0–10.5)
nRBC: 0 % (ref 0.0–0.2)

## 2019-03-05 LAB — BASIC METABOLIC PANEL
Anion gap: 10 (ref 5–15)
BUN: 40 mg/dL — ABNORMAL HIGH (ref 8–23)
CO2: 29 mmol/L (ref 22–32)
CREATININE: 1.38 mg/dL — AB (ref 0.61–1.24)
Calcium: 8.5 mg/dL — ABNORMAL LOW (ref 8.9–10.3)
Chloride: 102 mmol/L (ref 98–111)
GFR calc Af Amer: 51 mL/min — ABNORMAL LOW (ref >60)
GFR calc non Af Amer: 44 mL/min — ABNORMAL LOW (ref >60)
Glucose, Bld: 121 mg/dL — ABNORMAL HIGH (ref 70–99)
Potassium: 3.4 mmol/L — ABNORMAL LOW (ref 3.5–5.1)
Sodium: 141 mmol/L (ref 135–145)

## 2019-03-05 LAB — TROPONIN I: Troponin I: 0.04 ng/mL (ref <0.03)

## 2019-03-05 LAB — MAGNESIUM: Magnesium: 2 mg/dL (ref 1.7–2.4)

## 2019-03-05 MED ORDER — ISOSORBIDE MONONITRATE ER 60 MG PO TB24
60.0000 mg | ORAL_TABLET | Freq: Every day | ORAL | Status: DC
  Administered 2019-03-05 – 2019-03-06 (×2): 60 mg via ORAL
  Filled 2019-03-05 (×2): qty 1

## 2019-03-05 MED ORDER — ISOSORBIDE MONONITRATE ER 30 MG PO TB24: 30.0000 mg | ORAL_TABLET | Freq: Every day | ORAL | Status: DC

## 2019-03-05 MED ORDER — POTASSIUM CHLORIDE CRYS ER 20 MEQ PO TBCR
40.0000 meq | EXTENDED_RELEASE_TABLET | Freq: Two times a day (BID) | ORAL | Status: AC
  Administered 2019-03-05 (×2): 40 meq via ORAL
  Filled 2019-03-05 (×2): qty 2

## 2019-03-05 MED ORDER — LEVALBUTEROL HCL 0.63 MG/3ML IN NEBU
0.6300 mg | INHALATION_SOLUTION | Freq: Three times a day (TID) | RESPIRATORY_TRACT | Status: DC
  Administered 2019-03-05 – 2019-03-11 (×18): 0.63 mg via RESPIRATORY_TRACT
  Filled 2019-03-05 (×19): qty 3

## 2019-03-05 MED ORDER — DOCUSATE SODIUM 100 MG PO CAPS
100.0000 mg | ORAL_CAPSULE | Freq: Every day | ORAL | Status: DC
  Administered 2019-03-05 – 2019-03-12 (×5): 100 mg via ORAL
  Filled 2019-03-05 (×6): qty 1

## 2019-03-05 NOTE — Plan of Care (Signed)
  Problem: Education: Goal: Knowledge of cardiac device and self-care will improve Outcome: Progressing   Problem: Cardiac: Goal: Ability to achieve and maintain adequate cardiopulmonary perfusion will improve Outcome: Progressing   Problem: Clinical Measurements: Goal: Ability to maintain clinical measurements within normal limits will improve Outcome: Progressing Goal: Will remain free from infection Outcome: Progressing Goal: Diagnostic test results will improve Outcome: Progressing Goal: Respiratory complications will improve Outcome: Progressing Goal: Cardiovascular complication will be avoided Outcome: Progressing   Problem: Activity: Goal: Risk for activity intolerance will decrease Outcome: Progressing

## 2019-03-05 NOTE — Progress Notes (Signed)
Progress Note  Patient Name: Andre [name] Date of Encounter: 03/05/2019  Primary Cardiologist: Kirk Ruths, MD   Subjective   Feels better  Less sob   No chest pain   Inpatient Medications    Scheduled Meds: . amLODipine  10 mg Oral Daily  . atorvastatin  10 mg Oral Daily  . carvedilol  3.125 mg Oral BID WC  . levalbuterol  0.63 mg Nebulization TID  . mouth rinse  15 mL Mouth Rinse BID  . ramelteon  8 mg Oral QHS  . rivaroxaban  15 mg Oral Q supper  . sodium chloride flush  3 mL Intravenous Q12H  . sodium chloride flush  3 mL Intravenous Q12H   Continuous Infusions: . sodium chloride 10 mL/hr at 03/04/19 1400  . sodium chloride    . furosemide 120 mg (03/04/19 2119)   PRN Meds: sodium chloride, acetaminophen, hydrocortisone cream, ondansetron (ZOFRAN) IV, sodium chloride flush, sodium chloride flush   Vital Signs    Vitals:   03/05/19 0500 03/05/19 0600 03/05/19 0759 03/05/19 0811  BP:  134/86    Pulse:  81    Resp:  (!) 22  (!) 22  Temp:  98.1 F (36.7 C) 97.7 F (36.5 C)   TempSrc:  Oral Oral   SpO2:  97%    Weight: 66.1 kg     Height:        Intake/Output Summary (Last 24 hours) at 03/05/2019 8563 Last data filed at 03/05/2019 0500 Gross per 24 hour  Intake 685.7 ml  Output 2300 ml  Net -1614.3 ml   Last 3 Weights 03/05/2019 03/04/2019 03/03/2019  Weight (lbs) 145 lb 11.6 oz 148 lb 9.4 oz 151 lb 10.8 oz  Weight (kg) 66.1 kg 67.4 kg 68.8 kg      Telemetry    Personally reviewed  P-synchronous/ AV  pacing    ECG    No new EKGs - Personally Reviewed  Physical Exam   Well developed and nourished in nod distress  Less dyspneic  HENT normal Neck supple   Crackles basilare and wheezing though much improved from yesterday  Regular rate and rhythm, no murmurs or gallops Abd-soft with active BS No Clubbing cyanosis edema Skin-warm and dry A & Oriented  Grossly normal sensory and motor function      Labs    Chemistry Recent Labs   Lab 03/03/19 0634 03/04/19 1239 03/05/19 0240  NA 141 139 141  K 3.8 3.6 3.4*  CL 106 100 102  CO2 25 28 29   GLUCOSE 148* 139* 121*  BUN 54* 44* 40*  CREATININE 1.57* 1.41* 1.38*  CALCIUM 8.7* 8.6* 8.5*  PROT 6.6  --   --   ALBUMIN 2.4*  --   --   AST 26  --   --   ALT 25  --   --   ALKPHOS 50  --   --   BILITOT 0.8  --   --   GFRNONAA 37* 43* 44*  GFRAA 43* 49* 51*  ANIONGAP 10 11 10      Hematology Recent Labs  Lab 02/26/19 1305 03/03/19 0634  WBC 11.7* 12.8*  RBC 3.91* 3.49*  HGB 12.4* 10.5*  HCT 36.4* 32.8*  MCV 93.1 94.0  MCH 31.7 30.1  MCHC 34.1 32.0  RDW 13.1 12.8  PLT 265 299    Cardiac EnzymesNo results for input(s): TROPONINI in the last 168 hours. No results for input(s): TROPIPOC in the last 168 hours.  BNP No results for input(s): BNP, PROBNP in the last 168 hours.   DDimer No results for input(s): DDIMER in the last 168 hours.   Radiology    Dg Chest Port 1 View Result Date: 02/25/2019 CLINICAL DATA:  Status post pacemaker placement EXAM: PORTABLE CHEST 1 VIEW COMPARISON:  02/23/2019 FINDINGS: Cardiac shadow remains enlarged. Pacing device is now seen. No pneumothorax is noted. Vascular congestion is noted increased from the prior exam with increasing interstitial edema. Small right pleural effusion is noted. No focal confluent infiltrate is seen. IMPRESSION: Increasing vascular congestion with interstitial edema. No pneumothorax following pacemaker placement. Electronically Signed   By: Inez Catalina M.D.   On: 02/25/2019 07:18    Cardiac Studies   02/24/2019: TTE IMPRESSIONs  1. The left ventricle has normal systolic function with an ejection fraction of 60-65%. The cavity size was normal. Left ventricular diastolic Doppler parameters are indeterminate.  2. The right ventricle has normal systolic function. The cavity was normal. There is no increase in right ventricular wall thickness.  3. No pericardial effusion seen, though image quality is  limited.   TEE: 05/2016 StudyConclusions - Left ventricle: The cavity size was normal. Wall thickness was normal. Systolic function was normal. The estimated ejection fraction was in the range of 55% to 60%. Wall motion was normal; there were no regional wall motion abnormalities. No evidence of thrombus. - Mitral valve: There was mild regurgitation directed centrally. - Left atrium: The atrium was mildly to moderately dilated. No evidence of thrombus in the atrial cavity or appendage. There was mildintermittent spontaneous echo contrast (&quot;smoke&quot;) in the appendage. Emptying velocity was reduced. - Tricuspid valve: There was mild-moderate regurgitation directed centrally. Impressions: - Successful cardioversion. No cardiac source of emboli was indentified.  Patient Profile     85 y.o. male with past medical history of paroxysmal atrial fibrillation (s/p DCCV in 2017,on Xarelto), chronic diastolic CHF (EF 06-26% by TEE in 05/2016), HTN, HLD, and PAD (known aortoiliac disease with medical therapy recommended) who presented to [facility] ED for evaluation of worsening dyspnea. Initial EKG showing CHB.  Assessment & Plan     Complete Heart Block    Acute on Chronic Diastolic CHF Exacerbation   Paroxysmal Atrial Fibrillation, flutter    Hypokalemia   Acute on Chronic Stage 3 CKD     Renal function improved  Holding sinus  Will check troponin to exclude intercurrent major coronary event  Continue current diuresis   Will discontinue carvedilol  ? Reactive airways   Improved on xoponex  Will continue   Begin Imdur 60 mg   Replace K               Signed, Virl Axe, MD  03/05/2019, 8:12 AM

## 2019-03-06 ENCOUNTER — Inpatient Hospital Stay (HOSPITAL_COMMUNITY): Payer: Medicare Other

## 2019-03-06 ENCOUNTER — Ambulatory Visit: Payer: Medicare Other | Admitting: Internal Medicine

## 2019-03-06 LAB — BASIC METABOLIC PANEL
Anion gap: 8 (ref 5–15)
BUN: 41 mg/dL — ABNORMAL HIGH (ref 8–23)
CO2: 29 mmol/L (ref 22–32)
Calcium: 8.4 mg/dL — ABNORMAL LOW (ref 8.9–10.3)
Chloride: 103 mmol/L (ref 98–111)
Creatinine, Ser: 1.47 mg/dL — ABNORMAL HIGH (ref 0.61–1.24)
GFR calc Af Amer: 47 mL/min — ABNORMAL LOW (ref >60)
GFR calc non Af Amer: 41 mL/min — ABNORMAL LOW (ref >60)
Glucose, Bld: 146 mg/dL — ABNORMAL HIGH (ref 70–99)
POTASSIUM: 3.8 mmol/L (ref 3.5–5.1)
Sodium: 140 mmol/L (ref 135–145)

## 2019-03-06 MED ORDER — METOLAZONE 2.5 MG PO TABS
2.5000 mg | ORAL_TABLET | Freq: Every day | ORAL | Status: DC
  Administered 2019-03-06: 2.5 mg via ORAL
  Filled 2019-03-06: qty 1

## 2019-03-06 MED ORDER — AMIODARONE HCL 200 MG PO TABS
400.0000 mg | ORAL_TABLET | Freq: Two times a day (BID) | ORAL | Status: DC
  Administered 2019-03-06 – 2019-03-12 (×13): 400 mg via ORAL
  Filled 2019-03-06 (×13): qty 2

## 2019-03-06 MED ORDER — POTASSIUM CHLORIDE CRYS ER 20 MEQ PO TBCR
30.0000 meq | EXTENDED_RELEASE_TABLET | Freq: Two times a day (BID) | ORAL | Status: AC
  Administered 2019-03-06 (×2): 30 meq via ORAL
  Filled 2019-03-06 (×2): qty 1

## 2019-03-06 NOTE — Progress Notes (Signed)
Progress Note  Patient Name: Andre [name] Date of Encounter: 03/06/2019  Primary Cardiologist: Kirk Ruths, MD     Patient Profile     85 y.o. male with past medical history of paroxysmal atrial fibrillation (s/p DCCV in 2017,on Xarelto), chronic diastolic CHF (EF 03-47% by TEE in 05/2016), HTN, HLD, and PAD (known aortoiliac disease with medical therapy recommended) who presented w [facility]  >> underwent pacing and course cx by atrial flutter TEE >>DCCV 3/20 and A/C HFpEF  Subjective  Feels better today Less sob   Inpatient Medications    Scheduled Meds: . amLODipine  10 mg Oral Daily  . atorvastatin  10 mg Oral Daily  . docusate sodium  100 mg Oral Daily  . isosorbide mononitrate  60 mg Oral Daily  . levalbuterol  0.63 mg Nebulization TID  . mouth rinse  15 mL Mouth Rinse BID  . ramelteon  8 mg Oral QHS  . rivaroxaban  15 mg Oral Q supper  . sodium chloride flush  3 mL Intravenous Q12H  . sodium chloride flush  3 mL Intravenous Q12H   Continuous Infusions: . sodium chloride Stopped (03/06/19 0154)  . sodium chloride    . furosemide 120 mg (03/05/19 2308)   PRN Meds: sodium chloride, acetaminophen, hydrocortisone cream, ondansetron (ZOFRAN) IV, sodium chloride flush, sodium chloride flush   Vital Signs    Vitals:   03/06/19 0538 03/06/19 0600 03/06/19 0752 03/06/19 0820  BP:  (!) 154/115    Pulse:  72    Resp:  19    Temp:   97.6 F (36.4 C)   TempSrc:   Oral   SpO2:  98%  100%  Weight: 65.6 kg     Height:        Intake/Output Summary (Last 24 hours) at 03/06/2019 0833 Last data filed at 03/06/2019 0800 Gross per 24 hour  Intake 1000.6 ml  Output 1250 ml  Net -249.4 ml   Last 3 Weights 03/06/2019 03/05/2019 03/04/2019  Weight (lbs) 144 lb 10 oz 145 lb 11.6 oz 148 lb 9.4 oz  Weight (kg) 65.6 kg 66.1 kg 67.4 kg      Telemetry    Personally reviewed  V pacing and intercurrent AFlutter     ECG    No new EKGs - Personally Reviewed  Physical  Exam   Well developed and nourished in no acute distress still wearing O2 HENT normal Neck supple with JVP  Clear mild exp prolongation  Regular rate and rhythm, no murmurs or gallops Abd-soft with active BS No Clubbing cyanosis edema Skin-warm and dry A & Oriented  Grossly normal sensory and motor function      Labs    Chemistry Recent Labs  Lab 03/03/19 0634 03/04/19 1239 03/05/19 0240 03/06/19 0400  NA 141 139 141 140  K 3.8 3.6 3.4* 3.8  CL 106 100 102 103  CO2 25 28 29 29   GLUCOSE 148* 139* 121* 146*  BUN 54* 44* 40* 41*  CREATININE 1.57* 1.41* 1.38* 1.47*  CALCIUM 8.7* 8.6* 8.5* 8.4*  PROT 6.6  --   --   --   ALBUMIN 2.4*  --   --   --   AST 26  --   --   --   ALT 25  --   --   --   ALKPHOS 50  --   --   --   BILITOT 0.8  --   --   --   [facility]  37* 43* 44* 41*  GFRAA 43* 49* 51* 47*  ANIONGAP 10 11 10 8      Hematology Recent Labs  Lab 03/03/19 0634 03/05/19 0833  WBC 12.8* 10.8*  RBC 3.49* 3.33*  HGB 10.5* 10.6*  HCT 32.8* 31.1*  MCV 94.0 93.4  MCH 30.1 31.8  MCHC 32.0 34.1  RDW 12.8 12.5  PLT 299 396    Cardiac Enzymes Recent Labs  Lab 03/05/19 0833  TROPONINI 0.04*   No results for input(s): TROPIPOC in the last 168 hours.   BNP No results for input(s): BNP, PROBNP in the last 168 hours.   DDimer No results for input(s): DDIMER in the last 168 hours.   Radiology    Dg Chest Port 1 View Result Date: 02/25/2019 CLINICAL DATA:  Status post pacemaker placement EXAM: PORTABLE CHEST 1 VIEW COMPARISON:  02/23/2019 FINDINGS: Cardiac shadow remains enlarged. Pacing device is now seen. No pneumothorax is noted. Vascular congestion is noted increased from the prior exam with increasing interstitial edema. Small right pleural effusion is noted. No focal confluent infiltrate is seen. IMPRESSION: Increasing vascular congestion with interstitial edema. No pneumothorax following pacemaker placement. Electronically Signed   By: Inez Catalina M.D.   On:  02/25/2019 07:18    Cardiac Studies   02/24/2019: TTE IMPRESSIONs  1. The left ventricle has normal systolic function with an ejection fraction of 60-65%. The cavity size was normal. Left ventricular diastolic Doppler parameters are indeterminate.  2. The right ventricle has normal systolic function. The cavity was normal. There is no increase in right ventricular wall thickness.  3. No pericardial effusion seen, though image quality is limited.   TEE: 05/2016 StudyConclusions - Left ventricle: The cavity size was normal. Wall thickness was normal. Systolic function was normal. The estimated ejection fraction was in the range of 55% to 60%. Wall motion was normal; there were no regional wall motion abnormalities. No evidence of thrombus. - Mitral valve: There was mild regurgitation directed centrally. - Left atrium: The atrium was mildly to moderately dilated. No evidence of thrombus in the atrial cavity or appendage. There was mildintermittent spontaneous echo contrast (&quot;smoke&quot;) in the appendage. Emptying velocity was reduced. - Tricuspid valve: There was mild-moderate regurgitation directed centrally. Impressions: - Successful cardioversion. No cardiac source of emboli was indentified. .  Assessment & Plan     Complete Heart Block    Acute on Chronic Diastolic CHF Exacerbation   Paroxysmal Atrial Fibrillation, flutter   Hypokalemia normal    Acute on Chronic Stage 3 CKD     Renal function stable  Has reverted to atrial flutter >> pace terminated via programmer   Tn essentially normal    Continue Imdur 60 mg   Continue diuresis and will give one dose of metalozone and one dose of IV lasix    CXR reviewed with Dr Blima Rich agrees most consistent with CHF with underlying COPD ( hyperinflation)  continue current Rx  Ambulate with assistance  Cardiac rehab  Will begin amio to try to maintain sinus                Signed, Virl Axe, MD  03/06/2019, 8:33 AM

## 2019-03-07 LAB — BASIC METABOLIC PANEL
Anion gap: 10 (ref 5–15)
BUN: 43 mg/dL — ABNORMAL HIGH (ref 8–23)
CO2: 27 mmol/L (ref 22–32)
Calcium: 8.4 mg/dL — ABNORMAL LOW (ref 8.9–10.3)
Chloride: 101 mmol/L (ref 98–111)
Creatinine, Ser: 1.7 mg/dL — ABNORMAL HIGH (ref 0.61–1.24)
GFR calc non Af Amer: 34 mL/min — ABNORMAL LOW (ref >60)
GFR, EST AFRICAN AMERICAN: 39 mL/min — AB (ref >60)
Glucose, Bld: 122 mg/dL — ABNORMAL HIGH (ref 70–99)
Potassium: 4.3 mmol/L (ref 3.5–5.1)
Sodium: 138 mmol/L (ref 135–145)

## 2019-03-07 MED ORDER — ISOSORBIDE MONONITRATE ER 60 MG PO TB24
120.0000 mg | ORAL_TABLET | Freq: Every day | ORAL | Status: DC
  Administered 2019-03-07 – 2019-03-12 (×6): 120 mg via ORAL
  Filled 2019-03-07 (×6): qty 2

## 2019-03-07 MED ORDER — ENSURE ENLIVE PO LIQD
237.0000 mL | ORAL | Status: DC
  Administered 2019-03-07 – 2019-03-12 (×6): 237 mL via ORAL

## 2019-03-07 MED ORDER — ADULT MULTIVITAMIN W/MINERALS CH
1.0000 | ORAL_TABLET | Freq: Every day | ORAL | Status: DC
  Administered 2019-03-07 – 2019-03-12 (×6): 1 via ORAL
  Filled 2019-03-07 (×6): qty 1

## 2019-03-07 MED ORDER — FUROSEMIDE 10 MG/ML IJ SOLN
160.0000 mg | Freq: Once | INTRAVENOUS | Status: AC
  Administered 2019-03-07: 160 mg via INTRAVENOUS
  Filled 2019-03-07: qty 10

## 2019-03-07 NOTE — Progress Notes (Signed)
Progress Note  Patient Name: Andre [name] Date of Encounter: 03/07/2019  Primary Cardiologist: Kirk Ruths, MD   Subjective   Feeling better  Some coough   Inpatient Medications    Scheduled Meds: . amiodarone  400 mg Oral BID  . amLODipine  10 mg Oral Daily  . atorvastatin  10 mg Oral Daily  . docusate sodium  100 mg Oral Daily  . isosorbide mononitrate  60 mg Oral Daily  . levalbuterol  0.63 mg Nebulization TID  . mouth rinse  15 mL Mouth Rinse BID  . ramelteon  8 mg Oral QHS  . rivaroxaban  15 mg Oral Q supper  . sodium chloride flush  3 mL Intravenous Q12H  . sodium chloride flush  3 mL Intravenous Q12H   Continuous Infusions: . sodium chloride Stopped (03/06/19 0154)  . sodium chloride     PRN Meds: sodium chloride, acetaminophen, hydrocortisone cream, ondansetron (ZOFRAN) IV, sodium chloride flush, sodium chloride flush   Vital Signs    Vitals:   03/07/19 0300 03/07/19 0400 03/07/19 0500 03/07/19 0605  BP:  (!) 158/71  (!) 149/62  Pulse:  87  88  Resp:  (!) 22  (!) 25  Temp: 97.7 F (36.5 C)     TempSrc: Oral     SpO2:  93%  95%  Weight:   65.3 kg   Height:        Intake/Output Summary (Last 24 hours) at 03/07/2019 0729 Last data filed at 03/07/2019 0612 Gross per 24 hour  Intake 1162 ml  Output 1152 ml  Net 10 ml   Last 3 Weights 03/07/2019 03/06/2019 03/05/2019  Weight (lbs) 143 lb 15.4 oz 144 lb 10 oz 145 lb 11.6 oz  Weight (kg) 65.3 kg 65.6 kg 66.1 kg      Telemetry    SR/VP - Personally Reviewed  ECG    No new EKGs - Personally Reviewed  Physical Exam    GEN: No acute distress.wearing 02 Neck: No JVD Cardiac: RRR, no murmurs, rubs, or gallops.  Respiratory: Clear to auscultation bilaterally. GI: Soft, nontender, non-distended  MS: No edema; No deformity. Neuro:  Nonfocal  Psych: Normal affect   Labs    Chemistry Recent Labs  Lab 03/03/19 0634  03/05/19 0240 03/06/19 0400 03/07/19 0221  NA 141   < > 141 140 138   K 3.8   < > 3.4* 3.8 4.3  CL 106   < > 102 103 101  CO2 25   < > 29 29 27   GLUCOSE 148*   < > 121* 146* 122*  BUN 54*   < > 40* 41* 43*  CREATININE 1.57*   < > 1.38* 1.47* 1.70*  CALCIUM 8.7*   < > 8.5* 8.4* 8.4*  PROT 6.6  --   --   --   --   ALBUMIN 2.4*  --   --   --   --   AST 26  --   --   --   --   ALT 25  --   --   --   --   ALKPHOS 50  --   --   --   --   BILITOT 0.8  --   --   --   --   GFRNONAA 37*   < > 44* 41* 34*  GFRAA 43*   < > 51* 47* 39*  ANIONGAP 10   < > 10 8 10    < > =  values in this interval not displayed.     Hematology Recent Labs  Lab 03/03/19 0634 03/05/19 0833  WBC 12.8* 10.8*  RBC 3.49* 3.33*  HGB 10.5* 10.6*  HCT 32.8* 31.1*  MCV 94.0 93.4  MCH 30.1 31.8  MCHC 32.0 34.1  RDW 12.8 12.5  PLT 299 396    Cardiac Enzymes Recent Labs  Lab 03/05/19 0833  TROPONINI 0.04*   No results for input(s): TROPIPOC in the last 168 hours.   BNPNo results for input(s): BNP, PROBNP in the last 168 hours.   DDimer No results for input(s): DDIMER in the last 168 hours.   Radiology    Dg Chest Port 1 View Result Date: 03/06/2019 CLINICAL DATA:  Shortness of breath. History of CHF. Pacemaker placement 1 week ago. EXAM: PORTABLE CHEST 1 VIEW COMPARISON:  03/03/2019; 03/02/2019; 02/28/2019 02/23/2019 FINDINGS: Grossly unchanged enlarged cardiac silhouette. Stable position of support apparatus. Atherosclerotic plaque within the thoracic aorta. Pulmonary vasculature remains indistinct with cephalization of flow. Ill-defined interstitial opacities within the right upper and lower lung are unchanged. Unchanged small layering bilateral effusions and associated bibasilar opacities. No new focal airspace opacities. No pneumothorax. No acute osseous abnormalities. IMPRESSION: 1. Similar findings most suggestive of pulmonary edema with cephalization of flow, small bilateral effusions and associated bibasilar opacities, likely atelectasis. 2. Grossly unchanged asymmetric  ill-defined interstitial opacities within the right upper lung, potentially representative of asymmetric alveolar pulmonary edema, though underlying infection, including atypical etiologies, could have a similar appearance. Clinical correlation is advised. Electronically Signed   By: Sandi Mariscal M.D.   On: 03/06/2019 07:53    Cardiac Studies   02/24/2019: TTE IMPRESSIONs 1. The left ventricle has normal systolic function with an ejection fraction of 60-65%. The cavity size was normal. Left ventricular diastolic Doppler parameters are indeterminate. 2. The right ventricle has normal systolic function. The cavity was normal. There is no increase in right ventricular wall thickness. 3. No pericardial effusion seen, though image quality is limited.   TEE: 05/2016 StudyConclusions - Left ventricle: The cavity size was normal. Wall thickness was normal. Systolic function was normal. The estimated ejection fraction was in the range of 55% to 60%. Wall motion was normal; there were no regional wall motion abnormalities. No evidence of thrombus. - Mitral valve: There was mild regurgitation directed centrally. - Left atrium: The atrium was mildly to moderately dilated. No evidence of thrombus in the atrial cavity or appendage. There was mildintermittent spontaneous echo contrast (&quot;smoke&quot;) in the appendage. Emptying velocity was reduced. - Tricuspid valve: There was mild-moderate regurgitation directed centrally. Impressions: - Successful cardioversion. No cardiac source of emboli was indentified.  Patient Profile     83 y.o. male 85 y.o. male with past medical history of paroxysmal atrial fibrillation (s/p DCCV in 2017,on Xarelto), chronic diastolic CHF(EF 61-95% by TEE in 05/2016), HTN, HLD, and PAD(known aortoiliac disease with medical therapy recommended)[facility]. Initial EKG showing [facility]   Assessment & Plan    1. Complete Heart Block  2. Acute on Chronic Diastolic CHF Exacerbation  3. Paroxysmal Atrial Fibrillation, flutter  4. HLD    Acute on Chronic Stage 3 CKD ? hypertensive  HTN   On Rivaroxaban and tolerating    Recurrent Atrial flutter with pacetermination yesterday-- now on Amio tolerating well  CHF by CXR and O2 requirement still volume overloaded  Will diurese one more day and carfeul I&O  Increase imdur, avoiding carvedilol but would be open to  metop with greater B1 selectivity   Will begin mobilization and discharge planning     For questions or updates, please contact [place] Please consult [email or web address] for contact info under        Signed, Baldwin [place], PA-C  03/07/2019, 7:29 AM

## 2019-03-07 NOTE — Evaluation (Signed)
Occupational Therapy Evaluation Patient Details Name: Andre [name] MRN: [id number] DOB: November 01, 1923 Today's Date: 03/07/2019    History of Present Illness Pt is a 85 y.o. male admitted 02/23/19 with SOB. EKG showed complete heart block; s/p pacemaker implant 3/14. Pt with new onset afib; plan for DCCV 3/20. PMH includes PAF, HTN, PAD, CKD III, CHF, afib.   Clinical Impression   PTA Pt was independent in ADL and mobility - enjoyed walking program despite macular degeneration. At this time patient is overall min A for ADL and transfers/mobility with Rollator (4 wheeled walker). Pt with decreased safety awareness and would benefit from skilled OT in the acute setting as well as afterwards at the SNF level. Pt is caregiver for wife with memory deficits and with his decreased activity tolerance, new dependence on DME for transfers and mobility. I strongly suggest post-acute HHOT to address visual deficits and fall/safety eval in home setting to ensure safety after SNF stay.     Follow Up Recommendations  SNF;Supervision/Assistance - 24 hour    Equipment Recommendations  Other (comment)(defer to next venue)    Recommendations for Other Services Other (comment)(services for the blind)     Precautions / Restrictions Precautions Precautions: ICD/Pacemaker Restrictions Weight Bearing Restrictions: No      Mobility Bed Mobility               General bed mobility comments: OOB in recliner at beginning and end of session  Transfers Overall transfer level: Needs assistance Equipment used: 4-wheeled walker Transfers: Sit to/from Stand Sit to Stand: Min assist         General transfer comment: min A for boost and balance    Balance Overall balance assessment: Needs assistance Sitting-balance support: Feet supported;No upper extremity supported Sitting balance-Leahy Scale: Fair     Standing balance support: Bilateral upper extremity supported;During functional activity;No  upper extremity supported Standing balance-Leahy Scale: Fair Standing balance comment: able to static stand without support, requires support in dynamic situation                           ADL either performed or assessed with clinical judgement   ADL Overall ADL's : Needs assistance/impaired Eating/Feeding: Modified independent   Grooming: Min guard;Standing Grooming Details (indicate cue type and reason): fatigue and increased HR at sink Upper Body Bathing: Minimal assistance;Sitting   Lower Body Bathing: Min guard;Sit to/from stand   Upper Body Dressing : Min guard;Sitting   Lower Body Dressing: Minimal assistance;Sit to/from stand   Toilet Transfer: Min guard;Ambulation(rollator)   Toileting- Clothing Manipulation and Hygiene: Minimal assistance;Sit to/from stand       Functional mobility during ADLs: Minimal assistance;Cueing for safety(Rollator) General ADL Comments: fatigues with little self-awareness     Vision Baseline Vision/History: Macular Degeneration Patient Visual Report: No change from baseline       Perception     Praxis      Pertinent Vitals/Pain Pain Assessment: No/denies pain     Hand Dominance     Extremity/Trunk Assessment Upper Extremity Assessment Upper Extremity Assessment: Generalized weakness   Lower Extremity Assessment Lower Extremity Assessment: Generalized weakness       Communication Communication Communication: No difficulties   Cognition Arousal/Alertness: Awake/alert Behavior During Therapy: WFL for tasks assessed/performed Overall Cognitive Status: Within Functional Limits for tasks assessed  General Comments       Exercises     Shoulder Instructions      Home Living Family/patient expects to be discharged to:: Private residence Living Arrangements: Spouse/significant other(memory deficits) Available Help at Discharge: Family;Available 24  hours/day Type of Home: House Home Access: Stairs to enter [employer] of Steps: 5 Entrance Stairs-Rails: Left;Right Home Layout: Two level;Able to live on main level with bedroom/bathroom Alternate Level Stairs-Number of Steps: flight   Bathroom Shower/Tub: Teacher, early years/pre: Standard     Home Equipment: None   Additional Comments: Laundry in basement      Prior Functioning/Environment Level of Independence: Independent                 OT Problem List: Decreased strength;Decreased activity tolerance;Impaired balance (sitting and/or standing);Impaired vision/perception;Decreased safety awareness;Decreased knowledge of use of DME or AE;Cardiopulmonary status limiting activity      OT Treatment/Interventions: Self-care/ADL training;Energy conservation;DME and/or AE instruction;Therapeutic activities;Patient/family education;Balance training;Visual/perceptual remediation/compensation    OT Goals(Current goals can be found in the care plan section) Acute Rehab OT Goals Patient Stated Goal: get to safe level prior to going home to wife of 1 years OT Goal Formulation: With patient Time For Goal Achievement: 03/21/19 Potential to Achieve Goals: Good  OT Frequency: Min 2X/week   Barriers to D/C:            Co-evaluation              AM-PAC OT "6 Clicks" Daily Activity     Outcome Measure Help from another person eating meals?: A Little Help from another person taking care of personal grooming?: A Little Help from another person toileting, which includes using toliet, bedpan, or urinal?: A Little Help from another person bathing (including washing, rinsing, drying)?: A Little Help from another person to put on and taking off regular upper body clothing?: A Little Help from another person to put on and taking off regular lower body clothing?: A Little 6 Click Score: 18   End of Session Equipment Utilized During Treatment: Oxygen(3L;  Rollator) Nurse Communication: Mobility status;Precautions;Other (comment)(condom cath off, )  Activity Tolerance: Patient tolerated treatment well;Other (comment)(limted self-awarness to fatigue (very motivated)) Patient left: in chair;with call bell/phone within reach;with nursing/sitter in room  OT Visit Diagnosis: Unsteadiness on feet (R26.81);Muscle weakness (generalized) (M62.81)                Time: 3734-2876 OT Time Calculation (min): 23 min Charges:  OT General Charges $OT Visit: 1 Visit OT Evaluation $OT Eval Moderate Complexity: [place] OTR/L Acute Rehabilitation Services Pager: [phone] Office: [place] 03/07/2019, 1:50 PM

## 2019-03-07 NOTE — Evaluation (Signed)
Physical Therapy Evaluation Patient Details Name: Andre [name] MRN: [id number] DOB: 03/16/1923 Today's Date: 03/07/2019   History of Present Illness  Pt is a 85 y.o. male admitted 02/23/19 with SOB. EKG showed complete heart block; s/p pacemaker implant 3/14. Pt with new onset afib; plan for DCCV 3/20. PMH includes PAF, HTN, PAD, CKD III, CHF, afib.  Clinical Impression  Pt presents to PT with decr mobility and decr functional activity tolerance. Recommend SNF for further rehab to increase mobility and activity tolerance. Pt currently with poor awareness of monitoring of activity tolerance. Pt wishes to do more and isn't aware of declining SpO2.     Follow Up Recommendations SNF    Equipment Recommendations  Other (comment)(To be determined)    Recommendations for Other Services       Precautions / Restrictions Precautions Precautions: ICD/Pacemaker Restrictions Weight Bearing Restrictions: No      Mobility  Bed Mobility               General bed mobility comments: OOB in recliner at beginning and end of session  Transfers Overall transfer level: Needs assistance Equipment used: 4-wheeled walker Transfers: Sit to/from Stand Sit to Stand: Min assist         General transfer comment: Assist to bring hips up and for balance.  Ambulation/Gait Ambulation/Gait assistance: Min Web designer (Feet): 150 Feet Assistive device: 4-wheeled walker;None Gait Pattern/deviations: Step-through pattern;Decreased stride length;Trunk flexed Gait velocity: decr Gait velocity interpretation: 1.31 - 2.62 ft/sec, indicative of limited community ambulator General Gait Details: Assist for balance and lines. Pt amb on 3L with SpO2 87% after amb. Verbal cues for pursed lip breathing.   Stairs            Wheelchair Mobility    Modified Rankin (Stroke Patients Only)       Balance Overall balance assessment: Needs assistance Sitting-balance support: Feet  supported;No upper extremity supported Sitting balance-Leahy Scale: Fair     Standing balance support: Bilateral upper extremity supported;During functional activity;No upper extremity supported Standing balance-Leahy Scale: Fair Standing balance comment: able to static stand without support, requires support in dynamic situation                             Pertinent Vitals/Pain Pain Assessment: No/denies pain    Home Living Family/patient expects to be discharged to:: Private residence Living Arrangements: Spouse/significant other(memory deficits) Available Help at Discharge: Family;Available 24 hours/day Type of Home: House Home Access: Stairs to enter Entrance Stairs-Rails: Chemical engineer of Steps: 5 Home Layout: Two level;Able to live on main level with bedroom/bathroom Home Equipment: None Additional Comments: Laundry in basement    Prior Function Level of Independence: Independent               Hand Dominance        Extremity/Trunk Assessment   Upper Extremity Assessment Upper Extremity Assessment: Generalized weakness    Lower Extremity Assessment Lower Extremity Assessment: Generalized weakness       Communication   Communication: No difficulties  Cognition Arousal/Alertness: Awake/alert Behavior During Therapy: WFL for tasks assessed/performed Overall Cognitive Status: Within Functional Limits for tasks assessed                                        General Comments      Exercises  Assessment/Plan    PT Assessment Patient needs continued PT services  PT Problem List Decreased strength;Decreased activity tolerance;Decreased balance;Decreased mobility;Cardiopulmonary status limiting activity       PT Treatment Interventions DME instruction;Gait training;Functional mobility training;Stair training;Therapeutic exercise;Therapeutic activities;Balance training;Patient/family education    PT  Goals (Current goals can be found in the Care Plan section)  Acute Rehab PT Goals Patient Stated Goal: return home to see wife PT Goal Formulation: With patient Time For Goal Achievement: 03/14/19 Potential to Achieve Goals: Good    Frequency Min 3X/week   Barriers to discharge Decreased caregiver support wife with memory deficits    Co-evaluation               AM-PAC PT "6 Clicks" Mobility  Outcome Measure Help needed turning from your back to your side while in a flat bed without using bedrails?: None Help needed moving from lying on your back to sitting on the side of a flat bed without using bedrails?: None Help needed moving to and from a bed to a chair (including a wheelchair)?: A Little Help needed standing up from a chair using your arms (e.g., wheelchair or bedside chair)?: A Little Help needed to walk in hospital room?: A Little Help needed climbing 3-5 steps with a railing? : A Little 6 Click Score: 20    End of Session Equipment Utilized During Treatment: Gait belt;Oxygen Activity Tolerance: Patient limited by fatigue;Other (comment)(dyspnea and decr SpO2) Patient left: in chair;with call bell/phone within reach Nurse Communication: Mobility status PT Visit Diagnosis: Other abnormalities of gait and mobility (R26.89);Muscle weakness (generalized) (M62.81)    Time: 3295-1884 PT Time Calculation (min) (ACUTE ONLY): 23 min   Charges:   PT Evaluation $PT Eval Moderate Complexity: [place] Pager [phone] Office [place] 03/07/2019, 2:34 PM

## 2019-03-07 NOTE — TOC Progression Note (Addendum)
Transition of Care [facility]) - Progression Note    Patient Details  Name: Andre [name] MRN: [id number] Date of Birth: [birth year]  Transition of Care [facility]) CM/SW [place] RN, BSN, NCM-BC, [place] [phone] Phone Number: 03/07/2019, 10:58 AM  Clinical Narrative:  CM consult acknowledged for possible ST SNF placement. PT/OT eval pending for recommendations. Patient qualifies for [facility] benefits if he declines SNF placement. CM will await PT/OT recommendations; [place] can initially provide increased [place] aide services to assist with ADLs/safe DCP, along with additional [facility] disciplines to support patients care needs. CM team will continue to follow.   CM watching for arrangement of home oxygen; will need recent qualifying O2 sats, per Medicare requirements, prior to arranging DME.  Expected Discharge Plan: [place] Barriers to Discharge: Continued Medical Work up  Expected Discharge Plan and Services Expected Discharge Plan: [place] In-house Referral: NA Discharge Planning Services: CM Consult                     DME Arranged: N/A DME Agency: NA HH Arranged: NA [place] Agency: NA

## 2019-03-07 NOTE — Progress Notes (Signed)
Initial Nutrition Assessment  RD working remotely.  DOCUMENTATION CODES:   Not applicable  INTERVENTION:   - Ensure Enlive po daily, each supplement provides 350 kcal and 20 grams of protein  - MVI with minerals daily  - Encourage adequate PO intake  NUTRITION DIAGNOSIS:   Increased nutrient needs related to chronic illness (CKD stage III, CHF) as evidenced by estimated needs.  GOAL:   Patient will meet greater than or equal to 90% of their needs  MONITOR:   PO intake, Supplement acceptance, Weight trends, I & O's, Labs  REASON FOR ASSESSMENT:   LOS    ASSESSMENT:   85 year old male who presented to the ED on 3/14 with SOB.  PMH of atrial fibrillation, PVD, HLD, HTN, CHF, CKD stage III. Initial EKG showing complete heart block.  3/15 - s/p PPM placement 3/20 - TEE/DCCV 3/22 - transferred to ICU d/t to oxygen saturations dropping  Spoke with pt via phone call. Pt very HOH.  Pt states that his appetite was very poor on admission due to feeling ill and having trouble breathing. Pt reports that his appetite has slowly been improving since admission but that he is served "more food than I am used to."  Pt reports that his appetite was great at home PTA. Pt endorses eating 3 meals daily. Breakfast and dinner were large meals and lunch was lighter. A typical lunch may include a bagel with cheese or an apple with cheese. Dinner may include a pork chop with sides.  Pt endorses weight loss since admission, stating "it's because they've gotten all this fluid off me." Pt reports that PTA, he had been gaining weight and wasn't sure why. Weight down 7.4 kg since admission. Suspect this is related to negative fluid balance and diuresis during admission. Per RN edema assessment, pt continues to have mild pitting edema to BLE.  Pt denies any issues chewing or swallowing and denies any N/V/D.  Noted therapies recommending SNF.  Pt amenable to RD ordering an oral nutrition  supplement to aid in meeting increased kcal and protein needs during admission. Discussed importance of adequate nutrition in healing.  Meal Completion: 50-100% x 8 meals (average 86%)  Medications reviewed and include: Colace  Labs reviewed: BUN 43 (H), creatinine 1.70 (H)  UOP: 1251 ml x 24 hours I/O's: -7.7 L since admit  NUTRITION - FOCUSED PHYSICAL EXAM:  Unable to complete. RD working remotely.  Diet Order:   Diet Order            Diet Heart Room service appropriate? Yes; Fluid consistency: Thin  Diet effective now              EDUCATION NEEDS:   Education needs have been addressed  Skin:  Skin Assessment: Skin Integrity Issues: Incisions: left chest  Last BM:  03/07/19 medium type 4  Height:   Ht Readings from Last 1 Encounters:  03/03/19 5\' 5"  (1.651 m)    Weight:   Wt Readings from Last 1 Encounters:  03/07/19 65.3 kg    Ideal Body Weight:  61.8 kg  BMI:  Body mass index is 23.96 kg/m.  Estimated Nutritional Needs:   Kcal:  1500-1700  Protein:  75-90 grams  Fluid:  1.5-1.7 L    Gaynell Face, MS, RD, LDN Inpatient Clinical Dietitian Pager: [phone] Weekend/After Hours: [phone]

## 2019-03-08 LAB — BASIC METABOLIC PANEL
Anion gap: 11 (ref 5–15)
BUN: 47 mg/dL — ABNORMAL HIGH (ref 8–23)
CHLORIDE: 98 mmol/L (ref 98–111)
CO2: 28 mmol/L (ref 22–32)
Calcium: 8.7 mg/dL — ABNORMAL LOW (ref 8.9–10.3)
Creatinine, Ser: 1.89 mg/dL — ABNORMAL HIGH (ref 0.61–1.24)
GFR calc non Af Amer: 30 mL/min — ABNORMAL LOW (ref >60)
GFR, EST AFRICAN AMERICAN: 35 mL/min — AB (ref >60)
Glucose, Bld: 115 mg/dL — ABNORMAL HIGH (ref 70–99)
Potassium: 3.9 mmol/L (ref 3.5–5.1)
Sodium: 137 mmol/L (ref 135–145)

## 2019-03-08 NOTE — TOC Initial Note (Signed)
Transition of Care [facility]) - Initial/Assessment Note    Patient Details  Name: Andre [name] MRN: [id number] Date of Birth: 29-Dec-1922  Transition of Care [facility]) CM/SW Contact:    Andre [name], [place] Phone Number: 03/08/2019, 12:42 PM  Clinical Narrative:   CSW met with the patient at bedside. Patient is familiar with SNF. He declined the CMS list. He stated that he knew where he wanted to go. He stated that he would like to go to [place] or [place]. He would like to go to [place] as his first choice. CSW received permission to fax the patient out for bed offers.                 Expected Discharge Plan: Skilled Nursing Facility Barriers to Discharge: Continued Medical Work up   Patient Goals and CMS Choice Patient states their goals for this hospitalization and ongoing recovery are:: Pt wants to return home and get better CMS Medicare.gov Compare Post Acute Care list provided to:: Other (Comment Required)(Pt refused list. He gave his two placement choices) Choice offered to / list presented to : NA  Expected Discharge Plan and Services Expected Discharge Plan: [place] In-house Referral: NA Discharge Planning Services: NA Post Acute Care Choice: NA Living arrangements for the past 2 months: Single Family Home                 DME Arranged: N/A DME Agency: NA HH Arranged: NA HH Agency: NA  Prior Living Arrangements/Services Living arrangements for the past 2 months: Single Family Home Lives with:: Spouse Patient language and need for interpreter reviewed:: No Do you feel safe going back to the place where you live?: Yes      Need for Family Participation in Patient Care: No (Comment) Care giver support system in place?: Yes (comment)   Criminal Activity/Legal Involvement Pertinent to Current Situation/Hospitalization: No - Comment as needed  Activities of Daily Living Home Assistive Devices/Equipment: None ADL Screening (condition at time of  admission) Patient's cognitive ability adequate to safely complete daily activities?: Yes Is the patient deaf or have difficulty hearing?: Yes Does the patient have difficulty seeing, even when wearing glasses/contacts?: Yes Does the patient have difficulty concentrating, remembering, or making decisions?: No Patient able to express need for assistance with ADLs?: No Does the patient have difficulty dressing or bathing?: No Independently performs ADLs?: Yes (appropriate for developmental age) Does the patient have difficulty walking or climbing stairs?: No Weakness of Legs: None Weakness of Arms/Hands: None  Permission Sought/Granted Permission sought to share information with : Case Manager Permission granted to share information with : Yes, Verbal Permission Granted  Share Information with NAME: Andre [name] and Andre [name]  Permission granted to share info w AGENCY: [place] granted to share info w Relationship: Daughter & Wife     Emotional Assessment Appearance:: Appears stated age Attitude/Demeanor/Rapport: Engaged Affect (typically observed): Accepting Orientation: : Oriented to Self, Oriented to  Time, Oriented to Place, Oriented to Situation Alcohol / Substance Use: Not Applicable Psych Involvement: No (comment)  Admission diagnosis:  sob Patient Active Problem List   Diagnosis Date Noted  . Pressure injury of skin 02/27/2019  . CHB (complete heart block) ([place]) 02/23/2019  . Peripheral vascular disease ([place]) 04/21/2016  . Atrial fibrillation ([place]) 03/17/2016  . Diastolic CHF, chronic ([place]) 01/05/2016  . Macular degeneration 02/17/2015  . CKD (chronic kidney disease), stage III ([place]) 02/17/2015  . Former smoker 02/17/2015  . History of skin cancer   .  Malignant neoplasm of prostate ([place]) 01/05/2010  . Hyperlipidemia 12/04/2007  . Essential hypertension 12/04/2007  . Angiodysplasia of intestine with hemorrhage 05/15/2007   PCP:  Andre Olp,  MD Pharmacy:   [facility], [place] [address] Phone: [phone] Fax: [phone]  [facility] [place] to [address] [place] [address] Phone: [phone] Fax: [phone]     Social Determinants of Health (SDOH) Interventions    Readmission Risk Interventions Readmission Risk Prevention Plan 03/08/2019  Transportation Screening Complete  PCP or Specialist Appt within 5-7 Days Complete  Home Care Screening Complete  Medication Review (RN CM) Complete  Some recent data might be hidden

## 2019-03-08 NOTE — NC FL2 (Signed)
[place] MEDICAID FL2 LEVEL OF CARE SCREENING TOOL     IDENTIFICATION  Patient Name: Andre [name] Birthdate: [birth year] Sex: male Admission Date (Current Location): 02/23/2019  [facility] [place] Number:  Herbalist and Address:  [facility], [place] [address], [place], [place] [address]      Provider Number: [id number]  Attending Physician Name and Address:  Deboraha Sprang, MD  Relative Name and Phone Number:  Tome Wilson, Wife, [place], Daughter, [phone]    Current Level of Care: Hospital Recommended Level of Care: [place] Prior Approval Number:    Date Approved/Denied: 03/08/19 PASRR Number: [id number]  Discharge Plan: SNF    Current Diagnoses: Patient Active Problem List   Diagnosis Date Noted  . Pressure injury of skin 02/27/2019  . CHB (complete heart block) ([place]) 02/23/2019  . Peripheral vascular disease ([place]) 04/21/2016  . Atrial fibrillation ([place]) 03/17/2016  . Diastolic CHF, chronic ([place]) 01/05/2016  . Macular degeneration 02/17/2015  . CKD (chronic kidney disease), stage III ([place]) 02/17/2015  . Former smoker 02/17/2015  . History of skin cancer   . Malignant neoplasm of prostate ([place]) 01/05/2010  . Hyperlipidemia 12/04/2007  . Essential hypertension 12/04/2007  . Angiodysplasia of intestine with hemorrhage 05/15/2007    Orientation RESPIRATION BLADDER Height & Weight     Self, Time, Situation, Place  O2(96, [place], 3) Continent, External catheter Weight: 143 lb 15.4 oz (65.3 kg) Height:  5\' 5"  (165.1 cm)  BEHAVIORAL SYMPTOMS/MOOD NEUROLOGICAL BOWEL NUTRITION STATUS      Continent Diet(heart healthy/cardiac, thin liquids)  AMBULATORY STATUS COMMUNICATION OF NEEDS Skin   Limited Assist Verbally Normal, Surgical wounds, Bruising                       Personal Care Assistance Level of Assistance  Bathing, Total care, Feeding, Dressing Bathing Assistance: Limited  assistance Feeding assistance: Independent Dressing Assistance: Limited assistance Total Care Assistance: Limited assistance   Functional Limitations Info  Sight, Hearing, Speech Sight Info: Impaired(wears glasses) Hearing Info: Impaired(Hard of hearing) Speech Info: Adequate    SPECIAL CARE FACTORS FREQUENCY  PT (By licensed PT), OT (By licensed OT)     PT Frequency: 5x/wk OT Frequency: 5x/wk            Contractures Contractures Info: Not present    Additional Factors Info  Code Status, Allergies Code Status Info: Full Code Allergies Info: No Known Allergies           Current Medications (03/08/2019):  This is the current hospital active medication list Current Facility-Administered Medications  Medication Dose Route Frequency Provider Last Rate Last Dose  . 0.9 %  sodium chloride infusion  250 mL Intravenous PRN Lelon Perla, MD   Stopped at 03/06/19 0154  . 0.9 %  sodium chloride infusion  250 mL Intravenous Continuous Lelon Perla, MD      . acetaminophen (TYLENOL) tablet 325-650 mg  325-650 mg Oral Q4H PRN Lelon Perla, MD      . amiodarone (PACERONE) tablet 400 mg  400 mg Oral BID [place], PA-C   400 mg at 03/08/19 1009  . amLODipine (NORVASC) tablet 10 mg  10 mg Oral Daily Lelon Perla, MD   10 mg at 03/08/19 1010  . atorvastatin (LIPITOR) tablet 10 mg  10 mg Oral Daily Lelon Perla, MD   10 mg at 03/08/19 1009  . docusate sodium (COLACE)  capsule 100 mg  100 mg Oral Daily Deboraha Sprang, MD   100 mg at 03/05/19 0900  . feeding supplement (ENSURE ENLIVE) (ENSURE ENLIVE) liquid 237 mL  237 mL Oral Q24H Deboraha Sprang, MD   237 mL at 03/07/19 1746  . hydrocortisone cream 1 % 1 application  1 application Topical TID PRN Lelon Perla, MD      . isosorbide mononitrate (IMDUR) 24 hr tablet 120 mg  120 mg Oral Daily Baldwin [place], PA-C   120 mg at 03/08/19 1009  . levalbuterol (XOPENEX) nebulizer solution 0.63 mg  0.63 mg  Nebulization TID Deboraha Sprang, MD   0.63 mg at 03/08/19 0804  . MEDLINE mouth rinse  15 mL Mouth Rinse BID Lelon Perla, MD   15 mL at 03/06/19 2148  . multivitamin with minerals tablet 1 tablet  1 tablet Oral Daily Deboraha Sprang, MD   1 tablet at 03/08/19 1010  . ondansetron (ZOFRAN) injection 4 mg  4 mg Intravenous Q6H PRN Lelon Perla, MD      . ramelteon (ROZEREM) tablet 8 mg  8 mg Oral QHS Chriss Czar, MD   8 mg at 03/07/19 2101  . Rivaroxaban (XARELTO) tablet 15 mg  15 mg Oral Q supper Lelon Perla, MD   15 mg at 03/07/19 1751  . sodium chloride flush (NS) 0.9 % injection 3 mL  3 mL Intravenous Q12H Lelon Perla, MD   3 mL at 03/08/19 1016  . sodium chloride flush (NS) 0.9 % injection 3 mL  3 mL Intravenous PRN Lelon Perla, MD      . sodium chloride flush (NS) 0.9 % injection 3 mL  3 mL Intravenous Q12H Lelon Perla, MD   3 mL at 03/07/19 0924  . sodium chloride flush (NS) 0.9 % injection 3 mL  3 mL Intravenous PRN Stanford Breed Denice Bors, MD         Discharge Medications: Please see discharge summary for a list of discharge medications.  Relevant Imaging Results:  Relevant Lab Results:   Additional Information SSN: [id number]  [place], LCSWA

## 2019-03-09 DIAGNOSIS — R0602 Shortness of breath: Secondary | ICD-10-CM

## 2019-03-09 DIAGNOSIS — I480 Paroxysmal atrial fibrillation: Secondary | ICD-10-CM

## 2019-03-09 MED ORDER — FUROSEMIDE 10 MG/ML IJ SOLN
60.0000 mg | Freq: Once | INTRAMUSCULAR | Status: AC
  Administered 2019-03-09: 60 mg via INTRAVENOUS
  Filled 2019-03-09: qty 6

## 2019-03-09 NOTE — Progress Notes (Signed)
Progress Note   Subjective   Cough with deep inspiration,  SOB with ambulation in the room.  Inpatient Medications    Scheduled Meds: . amiodarone  400 mg Oral BID  . amLODipine  10 mg Oral Daily  . atorvastatin  10 mg Oral Daily  . docusate sodium  100 mg Oral Daily  . feeding supplement (ENSURE ENLIVE)  237 mL Oral Q24H  . isosorbide mononitrate  120 mg Oral Daily  . levalbuterol  0.63 mg Nebulization TID  . mouth rinse  15 mL Mouth Rinse BID  . multivitamin with minerals  1 tablet Oral Daily  . ramelteon  8 mg Oral QHS  . rivaroxaban  15 mg Oral Q supper  . sodium chloride flush  3 mL Intravenous Q12H  . sodium chloride flush  3 mL Intravenous Q12H   Continuous Infusions: . sodium chloride Stopped (03/06/19 0154)  . sodium chloride     PRN Meds: sodium chloride, acetaminophen, hydrocortisone cream, ondansetron (ZOFRAN) IV, sodium chloride flush, sodium chloride flush   Vital Signs    Vitals:   03/08/19 2300 03/08/19 2304 03/09/19 0448 03/09/19 0819  BP:   98/85   Pulse:   78   Resp:   16   Temp:   97.6 F (36.4 C)   TempSrc:   Oral   SpO2: (!) 87% 92% 97% 96%  Weight:   65 kg   Height:        Intake/Output Summary (Last 24 hours) at 03/09/2019 1048 Last data filed at 03/09/2019 0449 Gross per 24 hour  Intake -  Output 425 ml  Net -425 ml   Filed Weights   03/06/19 0538 03/07/19 0500 03/09/19 0448  Weight: 65.6 kg 65.3 kg 65 kg    Telemetry    Sinus with V pacing - Personally Reviewed  Physical Exam   GEN- The patient is elderly and frail appearing, alert and oriented x 3 today.   Head- normocephalic, atraumatic Eyes-  Sclera clear, conjunctiva pink Ears- hearing intact Oropharynx- clear Neck- supple, Lungs-  Bibasilar rales are prominent, tachypneic after walking Heart- Regular rate and rhythm  GI- soft, NT, ND, + BS Extremities- no clubbing, cyanosis, or edema  MS- diffuse atrophy Skin- wound on sacrum has a dressing Psych- euthymic  mood, full affect Neuro- strength and sensation are intact   Labs    Chemistry Recent Labs  Lab 03/03/19 0634  03/06/19 0400 03/07/19 0221 03/08/19 0216  NA 141   < > 140 138 137  K 3.8   < > 3.8 4.3 3.9  CL 106   < > 103 101 98  CO2 25   < > 29 27 28   GLUCOSE 148*   < > 146* 122* 115*  BUN 54*   < > 41* 43* 47*  CREATININE 1.57*   < > 1.47* 1.70* 1.89*  CALCIUM 8.7*   < > 8.4* 8.4* 8.7*  PROT 6.6  --   --   --   --   ALBUMIN 2.4*  --   --   --   --   AST 26  --   --   --   --   ALT 25  --   --   --   --   ALKPHOS 50  --   --   --   --   BILITOT 0.8  --   --   --   --   GFRNONAA 37*   < > 41*  34* 30*  GFRAA 43*   < > 47* 39* 35*  ANIONGAP 10   < > 8 10 11    < > = values in this interval not displayed.     Hematology Recent Labs  Lab 03/03/19 0634 03/05/19 0833  WBC 12.8* 10.8*  RBC 3.49* 3.33*  HGB 10.5* 10.6*  HCT 32.8* 31.1*  MCV 94.0 93.4  MCH 30.1 31.8  MCHC 32.0 34.1  RDW 12.8 12.5  PLT 299 396    Cardiac Enzymes Recent Labs  Lab 03/05/19 0833  TROPONINI 0.04*   No results for input(s): TROPIPOC in the last 168 hours.     Patient Profile     85 y.o. male 85 y.o.malewith past medical history of paroxysmal atrial fibrillation (s/p DCCV in 2017,on Xarelto), chronic diastolic CHF(EF 26-41% by TEE in 05/2016), HTN, HLD, and PAD(known aortoiliac disease with medical therapy recommended)[facility]. Initial EKG showing CHB  Assessment & Plan    1.  Acute on chronic diastolic dysfunction Volume overloaded on exam Will give lasix 60mg  IV x 1 now Complicated by renal failure and advanced age  59. Paroxysmal atrial fibrillation/ atrial flutter On xarelto and amiodarone as per Dr Caryl Comes Would reduce amiodarone to 200mg  BID at discharge In 2 weeks, reduce amiodarone to 200mg  daily  3. Acute on chronic stage III renal failure Stable No change required today Check bmet tomorrow (none ordered  for today)  4.  Complete heart block S/p PPM Incision site looks good  Anticipate SNF at discharge SW is working with family Would advise transition of care post discharge  Needs PT   Patient is quite ill.  He is at risk for decompensation.  A high level of decision making was required for this encounter. Thompson Grayer MD, [facility] 03/09/2019 10:48 AM

## 2019-03-10 ENCOUNTER — Inpatient Hospital Stay (HOSPITAL_COMMUNITY): Payer: Medicare Other

## 2019-03-10 DIAGNOSIS — N182 Chronic kidney disease, stage 2 (mild): Secondary | ICD-10-CM

## 2019-03-10 LAB — BASIC METABOLIC PANEL
Anion gap: 10 (ref 5–15)
BUN: 50 mg/dL — ABNORMAL HIGH (ref 8–23)
CO2: 29 mmol/L (ref 22–32)
Calcium: 8.5 mg/dL — ABNORMAL LOW (ref 8.9–10.3)
Chloride: 97 mmol/L — ABNORMAL LOW (ref 98–111)
Creatinine, Ser: 2.01 mg/dL — ABNORMAL HIGH (ref 0.61–1.24)
GFR calc Af Amer: 32 mL/min — ABNORMAL LOW (ref >60)
GFR, EST NON AFRICAN AMERICAN: 28 mL/min — AB (ref >60)
Glucose, Bld: 122 mg/dL — ABNORMAL HIGH (ref 70–99)
Potassium: 3.6 mmol/L (ref 3.5–5.1)
Sodium: 136 mmol/L (ref 135–145)

## 2019-03-10 MED ORDER — FUROSEMIDE 10 MG/ML IJ SOLN
20.0000 mg | Freq: Once | INTRAMUSCULAR | Status: AC
  Administered 2019-03-10: 20 mg via INTRAVENOUS
  Filled 2019-03-10: qty 2

## 2019-03-10 NOTE — Progress Notes (Signed)
During shift bedside report, pt noted to have increased work of breathing and persistent dry cough.  Pulse oximetry obtained at 88-89% on 3L per [place].  Pt with diffuse crackles throughout.  Oxygen increased to 4L humidified with sats increasing to 90-91%.  CXR obtained this am.  MD notified with patient symptoms, UO, BMP and CXR results from today.  Orders received for 20 mg IV lasix.  Medication administered per orders.  Pt currently resting in bed without c/o.  Will continue to monitor patient and urine output closely.

## 2019-03-10 NOTE — Progress Notes (Signed)
CSW notes social work attempt yesterday to reach [employer].   CSW reached out to patient's preference of [facility] yet again today and they report no one is working on Sunday for admissions to review the referral. They suggest calling tomorrow morning.   [place], [place]

## 2019-03-10 NOTE — Progress Notes (Addendum)
Progress Note  Patient Name: Andre [name] Date of Encounter: 03/10/2019  Primary Cardiologist: Kirk Ruths, MD   Subjective   No significant overnight events. Patient had a few coughing spells overnight but feels like his breathing is better than yesterday. No chest pain, palpitations, lightheadedness, or dizziness.   Inpatient Medications    Scheduled Meds: . amiodarone  400 mg Oral BID  . amLODipine  10 mg Oral Daily  . atorvastatin  10 mg Oral Daily  . docusate sodium  100 mg Oral Daily  . feeding supplement (ENSURE ENLIVE)  237 mL Oral Q24H  . isosorbide mononitrate  120 mg Oral Daily  . levalbuterol  0.63 mg Nebulization TID  . mouth rinse  15 mL Mouth Rinse BID  . multivitamin with minerals  1 tablet Oral Daily  . ramelteon  8 mg Oral QHS  . rivaroxaban  15 mg Oral Q supper  . sodium chloride flush  3 mL Intravenous Q12H  . sodium chloride flush  3 mL Intravenous Q12H   Continuous Infusions: . sodium chloride Stopped (03/06/19 0154)  . sodium chloride     PRN Meds: sodium chloride, acetaminophen, hydrocortisone cream, ondansetron (ZOFRAN) IV, sodium chloride flush, sodium chloride flush   Vital Signs    Vitals:   03/09/19 1235 03/09/19 2026 03/10/19 0509 03/10/19 0726  BP: (!) 106/59 (!) 115/52 (!) 129/52   Pulse: 84 84 87   Resp:  15 16   Temp: (!) 97.5 F (36.4 C) 98 F (36.7 C) 98.4 F (36.9 C)   TempSrc: Oral Oral Oral   SpO2: 91% 90% 90% 94%  Weight:      Height:        Intake/Output Summary (Last 24 hours) at 03/10/2019 0842 Last data filed at 03/09/2019 1825 Gross per 24 hour  Intake 440 ml  Output 650 ml  Net -210 ml   Filed Weights   03/06/19 0538 03/07/19 0500 03/09/19 0448  Weight: 65.6 kg 65.3 kg 65 kg    Telemetry    Sinus with ventricular paced rhythm. - Personally Reviewed  ECG    No new ECG tracing since 03/01/2019. - Personally Reviewed  Physical Exam   GEN: Elderly male resting comfortably. Alert and in no  acute distress.   Neck: Supple. No JVD with patient sitting upright. Cardiac: RRR. II/VI systolic murmur noted. No rubs or gallops. Respiratory: No increased work of breathing. Bibasilar rales noted (right > left). No wheezes or rhonchi. GI: Abdomen soft, non-distended, and non-tender. Bowel sounds present. MS: Mild lower extremity edema. No deformity. Skin: Warm and dry. Neuro:  No focal deficits. Psych: Normal affect. Responds appropriately.  Labs    Chemistry Recent Labs  Lab 03/07/19 0221 03/08/19 0216 03/10/19 0243  NA 138 137 136  K 4.3 3.9 3.6  CL 101 98 97*  CO2 27 28 29   GLUCOSE 122* 115* 122*  BUN 43* 47* 50*  CREATININE 1.70* 1.89* 2.01*  CALCIUM 8.4* 8.7* 8.5*  GFRNONAA 34* 30* 28*  GFRAA 39* 35* 32*  ANIONGAP 10 11 10      Hematology Recent Labs  Lab 03/05/19 0833  WBC 10.8*  RBC 3.33*  HGB 10.6*  HCT 31.1*  MCV 93.4  MCH 31.8  MCHC 34.1  RDW 12.5  PLT 396    Cardiac Enzymes Recent Labs  Lab 03/05/19 0833  TROPONINI 0.04*   No results for input(s): TROPIPOC in the last 168 hours.   BNPNo results for input(s): BNP, PROBNP in the  last 168 hours.   DDimer No results for input(s): DDIMER in the last 168 hours.   Radiology    No results found.  Cardiac Studies   Echocardiogram 02/24/2019: 1. The left ventricle has normal systolic function with an ejection fraction of 60-65%. The cavity size was normal. Left ventricular diastolic Doppler parameters are indeterminate.  2. The right ventricle has normal systolic function. The cavity was normal. There is no increase in right ventricular wall thickness.  3. No pericardial effusion seen, though image quality is limited. _______________  TEE 03/01/2019:  1. The left ventricle has normal systolic function, with an ejection fraction of 60-65%. No evidence of left ventricular regional wall motion abnormalities.  2. The right ventricle has normal systolc function. The cavity was normal.  3. Left  atrial size was moderately dilated.  4. Right atrial size was moderately dilated.  5. The mitral valve is grossly normal.  6. The aortic valve is tricuspid Mild calcification of the aortic valve. Aortic valve regurgitation is trivial by color flow Doppler. no stenosis of the aortic valve.  7. There is evidence of severe plaque in the descending aorta.  8. Normal LV function; LVH; moderate biatrial enlargement; no LAA thrombus; emptying velocity > 80 cm/sec; mild MR and TR; severe atherosclerosis descending aorta.  Patient Profile   Mr. Koeppen is a 85 y.o. male with a history of paroxysmal atrial fibrillation s/p DCCV in 2017 and on Xarelto, chronic diastolic CHF with EF of 54-00% on TEE in 2017, hypertension, hyperlipidemia, and PAD who presented to the [facility] ED for evaluation of worsening dyspnea. Initial EKG showed complete heart block. He underwent permanent pacemaker placement on 02/24/2019.  Assessment & Plan    Complete Heart Block s/p PPM - Telemetry shows sinus with ventricular paced rhythm.  Acute on Chronic Diastolic Dysfunction - LVEF 60-65% on Echo this admission. - Received on dose of IV Lasix 40mg  yesterday. Documented urine output of 650 mL since then.  - Patient continues to have bibasilar crakles (right > left). - Patient may benefit from another round of Lasix but creatinine increased from 1.89 to 2.01 after dose yesterday. Will discuss with MD. - Continue to monitor daily weight, strict I/O's, and renal function.  Paroxymal Atrial Fibrillation/Flutter - Telemetry shows ventricular paced rhythm. - Patient on Xarelto 15mg  daily and Amiodarone 400mg  twice daily as per Dr. Caryl Comes. - May reduce Amiodarone to 200mg  twice daily at discharge x2 weeks and then transition to 200mg  daily.  Acute on CKD Stage II - Serum creatinine 2.01 today, up from 1.89 after IV Lasix yesterday. Baseline prior to this admission 1.3 to 1.6.   Disposition: Planning for SNF at discharge. SW  is working with family. Waiting for placement at this time.   For questions or updates, please contact [place] Please consult [email or web address] for contact info under Cardiology/STEMI.      Signed, Darreld Mclean, PA-C  03/10/2019, 8:42 AM     I have seen, examined the patient, and reviewed the above assessment and plan.  Changes to above are made where necessary.  On exam, RRR. Diffuse crackles.   Exam is not improved with additional diuresis.  I worry about possible underlying fibrosis.  Will repeat CXR to see if it is different.  Could consider high resolution CT (its a non contrast study) to evaluate for fibrosis depending on CXR findings. He may need additional inpatient management given ongoing O2 requirement and SOB.  Co Sign: Thompson Grayer, MD 03/10/2019 9:43  AM

## 2019-03-11 ENCOUNTER — Ambulatory Visit: Payer: Medicare Other

## 2019-03-11 LAB — BASIC METABOLIC PANEL
Anion gap: 9 (ref 5–15)
BUN: 51 mg/dL — AB (ref 8–23)
CO2: 29 mmol/L (ref 22–32)
Calcium: 8.6 mg/dL — ABNORMAL LOW (ref 8.9–10.3)
Chloride: 100 mmol/L (ref 98–111)
Creatinine, Ser: 1.85 mg/dL — ABNORMAL HIGH (ref 0.61–1.24)
GFR calc Af Amer: 36 mL/min — ABNORMAL LOW (ref >60)
GFR calc non Af Amer: 31 mL/min — ABNORMAL LOW (ref >60)
Glucose, Bld: 125 mg/dL — ABNORMAL HIGH (ref 70–99)
Potassium: 3.5 mmol/L (ref 3.5–5.1)
Sodium: 138 mmol/L (ref 135–145)

## 2019-03-11 MED ORDER — MELATONIN 3 MG PO TABS
3.0000 mg | ORAL_TABLET | Freq: Once | ORAL | Status: AC
  Administered 2019-03-12: 3 mg via ORAL
  Filled 2019-03-11: qty 1

## 2019-03-11 MED ORDER — FUROSEMIDE 40 MG PO TABS
40.0000 mg | ORAL_TABLET | Freq: Every day | ORAL | Status: DC
  Administered 2019-03-11 – 2019-03-12 (×2): 40 mg via ORAL
  Filled 2019-03-11 (×2): qty 1

## 2019-03-11 MED ORDER — NON FORMULARY: 3.0000 mg | Freq: Once | Status: DC

## 2019-03-11 MED ORDER — POTASSIUM CHLORIDE CRYS ER 20 MEQ PO TBCR
40.0000 meq | EXTENDED_RELEASE_TABLET | Freq: Once | ORAL | Status: AC
  Administered 2019-03-11: 40 meq via ORAL
  Filled 2019-03-11: qty 2

## 2019-03-11 MED ORDER — LEVALBUTEROL HCL 0.63 MG/3ML IN NEBU
0.6300 mg | INHALATION_SOLUTION | Freq: Two times a day (BID) | RESPIRATORY_TRACT | Status: DC
  Administered 2019-03-11 – 2019-03-12 (×2): 0.63 mg via RESPIRATORY_TRACT
  Filled 2019-03-11 (×2): qty 3

## 2019-03-11 NOTE — Progress Notes (Signed)
Physical Therapy Treatment Patient Details Name: Andre [name] MRN: [id number] DOB: 03/07/1923 Today's Date: 03/11/2019    History of Present Illness Pt is a 85 y.o. male admitted 02/23/19 with SOB. EKG showed complete heart block; s/p pacemaker implant 3/14. Pt with new onset afib; plan for DCCV 3/20. PMH includes PAF, HTN, PAD, CKD III, CHF, afib.    PT Comments    Pt was agreeable to get his 3rd walk in the halls.  Emphasis on gaining optimal speed without fast drop in SpO2, Gait stability, balance challenge including scanning, backing up and directional changes, stair training.    Follow Up Recommendations  SNF     Equipment Recommendations  None recommended by PT    Recommendations for Other Services       Precautions / Restrictions Precautions Precautions: ICD/Pacemaker Restrictions LUE Weight Bearing: Non weight bearing    Mobility  Bed Mobility Overal bed mobility: Modified Independent                Transfers Overall transfer level: Needs assistance Equipment used: None Transfers: Sit to/from Stand Sit to Stand: Supervision         General transfer comment: min guard for safety and balance   Ambulation/Gait Ambulation/Gait assistance: Min guard Gait Distance (Feet): 330 Feet(with 3 standing rest propped on the wall) Assistive device: None Gait Pattern/deviations: Step-through pattern Gait velocity: decr Gait velocity interpretation: 1.31 - 2.62 ft/sec, indicative of limited community ambulator General Gait Details: short, flat-footed steps with little trunk dissociation, little arm swing.  Sats dropping at 4 L [place] to 77% and up to as much as 88% on 6L [place] after standing rests.   Stairs Stairs: Yes Stairs assistance: Min guard Stair Management: One rail Right;Alternating pattern;Forwards Number of Stairs: 3 General stair comments: safe with the rail   Wheelchair Mobility    Modified Rankin (Stroke Patients Only)       Balance  Overall balance assessment: Needs assistance Sitting-balance support: Feet supported;No upper extremity supported Sitting balance-Leahy Scale: Fair     Standing balance support: No upper extremity supported;During functional activity Standing balance-Leahy Scale: Fair Standing balance comment: supervision for safety, within base of support                            Cognition Arousal/Alertness: Awake/alert Behavior During Therapy: WFL for tasks assessed/performed Overall Cognitive Status: Within Functional Limits for tasks assessed                                        Exercises      General Comments General comments (skin integrity, edema, etc.): oxgyen saturations monitored throughout session; education on PLB and energy conservation--on 4L upon entry and required increased to 6L to maintain > 90% during activity       Pertinent Vitals/Pain Pain Assessment: No/denies pain    Home Living                      Prior Function            PT Goals (current goals can now be found in the care plan section) Acute Rehab PT Goals Patient Stated Goal: return home to see wife PT Goal Formulation: With patient Time For Goal Achievement: 03/14/19 Potential to Achieve Goals: Good Progress towards PT goals: Progressing toward goals    Frequency  Min 3X/week      PT Plan Current plan remains appropriate    Co-evaluation              AM-PAC PT "6 Clicks" Mobility   Outcome Measure  Help needed turning from your back to your side while in a flat bed without using bedrails?: None Help needed moving from lying on your back to sitting on the side of a flat bed without using bedrails?: None Help needed moving to and from a bed to a chair (including a wheelchair)?: A Little Help needed standing up from a chair using your arms (e.g., wheelchair or bedside chair)?: A Little Help needed to walk in hospital room?: A Little Help needed  climbing 3-5 steps with a railing? : A Little 6 Click Score: 20    End of Session Equipment Utilized During Treatment: Oxygen Activity Tolerance: Patient tolerated treatment well;Patient limited by fatigue Patient left: in bed;with call bell/phone within reach Nurse Communication: Mobility status PT Visit Diagnosis: Other abnormalities of gait and mobility (R26.89);Muscle weakness (generalized) (M62.81)     Time: 3662-9476 PT Time Calculation (min) (ACUTE ONLY): 24 min  Charges:  $Gait Training: 8-22 mins $Therapeutic Activity: 8-22 mins                     03/11/2019  Donnella Sham, PT [name] [phone]  (pager) [phone]  (office)   [name] 03/11/2019, 5:30 PM

## 2019-03-11 NOTE — TOC Progression Note (Addendum)
Transition of Care [facility]) - Progression Note    Patient Details  Name: EMRIK ERHARD MRN: [id number] Date of Birth: 09-May-1923  Transition of Care [facility]) CM/SW [place], LCSW Phone Number: 03/11/2019, 10:28 AM  Clinical Narrative: [facility] admissions coordinator is reviewing referral.  11:59 am: [facility] will have a bed for patient tomorrow. It is a private room and an extra $38 cost. Patient and daughter aware and agreeable.  Expected Discharge Plan: [place] Barriers to Discharge: Continued Medical Work up  Expected Discharge Plan and Services Expected Discharge Plan: [place] In-house Referral: NA Discharge Planning Services: NA Post Acute Care Choice: NA Living arrangements for the past 2 months: Single Family Home                 DME Arranged: N/A DME Agency: NA HH Arranged: NA HH Agency: NA   Social Determinants of Health (SDOH) Interventions    Readmission Risk Interventions Readmission Risk Prevention Plan 03/08/2019  Transportation Screening Complete  PCP or Specialist Appt within 5-7 Days Complete  Home Care Screening Complete  Medication Review (RN CM) Complete  Some recent data might be hidden

## 2019-03-11 NOTE — Progress Notes (Signed)
Progress Note  Patient Name: Andre [name] Date of Encounter: 03/11/2019  Primary Cardiologist: Andre Ruths, MD   Subjective   C/O DOE; no CP  Inpatient Medications    Scheduled Meds: . amiodarone  400 mg Oral BID  . amLODipine  10 mg Oral Daily  . atorvastatin  10 mg Oral Daily  . docusate sodium  100 mg Oral Daily  . feeding supplement (ENSURE ENLIVE)  237 mL Oral Q24H  . isosorbide mononitrate  120 mg Oral Daily  . levalbuterol  0.63 mg Nebulization TID  . mouth rinse  15 mL Mouth Rinse BID  . multivitamin with minerals  1 tablet Oral Daily  . ramelteon  8 mg Oral QHS  . rivaroxaban  15 mg Oral Q supper  . sodium chloride flush  3 mL Intravenous Q12H  . sodium chloride flush  3 mL Intravenous Q12H   Continuous Infusions: . sodium chloride Stopped (03/06/19 0154)  . sodium chloride     PRN Meds: sodium chloride, acetaminophen, hydrocortisone cream, ondansetron (ZOFRAN) IV, sodium chloride flush, sodium chloride flush   Vital Signs    Vitals:   03/10/19 1445 03/10/19 2011 03/10/19 2138 03/11/19 0558  BP: (!) 101/56  (!) 109/51 (!) 120/58  Pulse: 87  85 83  Resp: 14  16   Temp: 98.1 F (36.7 C)  98.4 F (36.9 C) 98 F (36.7 C)  TempSrc: Oral  Oral Oral  SpO2: (!) 85% 96% 93% 92%  Weight:    64.5 kg  Height:        Intake/Output Summary (Last 24 hours) at 03/11/2019 0834 Last data filed at 03/11/2019 0000 Gross per 24 hour  Intake 720 ml  Output 1100 ml  Net -380 ml   Last 3 Weights 03/11/2019 03/09/2019 03/07/2019  Weight (lbs) 142 lb 4.8 oz 143 lb 4.8 oz 143 lb 15.4 oz  Weight (kg) 64.547 kg 65 kg 65.3 kg      Telemetry    Sinus with Vpacing- Personally Reviewed   Physical Exam   GEN: No acute distress; frail.   Neck: No JVD Cardiac: RRR Respiratory: Mildly diminished BS bases GI: Soft, nontender, non-distended  MS: No edema Neuro:  Nonfocal  Psych: Normal affect   Labs    Chemistry Recent Labs  Lab 03/08/19 0216 03/10/19  0243 03/11/19 0531  NA 137 136 138  K 3.9 3.6 3.5  CL 98 97* 100  CO2 28 29 29   GLUCOSE 115* 122* 125*  BUN 47* 50* 51*  CREATININE 1.89* 2.01* 1.85*  CALCIUM 8.7* 8.5* 8.6*  GFRNONAA 30* 28* 31*  GFRAA 35* 32* 36*  ANIONGAP 11 10 9      Hematology Recent Labs  Lab 03/05/19 0833  WBC 10.8*  RBC 3.33*  HGB 10.6*  HCT 31.1*  MCV 93.4  MCH 31.8  MCHC 34.1  RDW 12.5  PLT 396    Cardiac Enzymes Recent Labs  Lab 03/05/19 0833  TROPONINI 0.04*    Radiology    Dg Chest 2 View  Result Date: 03/10/2019 CLINICAL DATA:  Shortness of breath, productive cough. EXAM: CHEST - 2 VIEW COMPARISON:  Radiograph of March 06, 2019. FINDINGS: Stable cardiomediastinal silhouette. Left-sided pacemaker is unchanged in position. Atherosclerosis of thoracic aorta is noted. No pneumothorax or significant pleural effusion is noted. Stable diffuse interstitial opacities are noted throughout both lungs concerning for pulmonary edema or possibly atypical inflammation. Stable mild bibasilar subsegmental atelectasis is noted. Bony thorax is unremarkable. IMPRESSION: Stable diffuse interstitial  opacities are noted throughout both lungs concerning for pulmonary edema or possibly atypical inflammation. Aortic Atherosclerosis (ICD10-I70.0). Electronically Signed   By: Marijo Conception, M.D.   On: 03/10/2019 10:33     Patient Profile     Andre [name] is a 85 y.o. male with a history of paroxysmal atrial fibrillation s/p DCCV in 2017 and on Xarelto, chronic diastolic CHF with EF of 79-72% on TEE in 2017, hypertension, hyperlipidemia, and PAD who presented to the [facility] ED for evaluation of worsening dyspnea. Initial EKG showed complete heart block. He underwent permanent pacemaker placement on 02/24/2019.  Echocardiogram this admission on March 15 showed normal LV function.  Assessment & Plan    1 acute on chronic diastolic congestive heart failure-chest x-ray yesterday continues to show infiltrates.   Unclear if this is all edema.  I will add Lasix 40 mg by mouth daily.  Follow renal function closely.  2 status post pacemaker-he will need follow-up with electrophysiology following discharge.  3 paroxysmal atrial fibrillation-patient remains in sinus rhythm today.  Continue amiodarone at present dose.  Decrease to 200 mg twice daily for 1 week following discharge and then 200 mg daily thereafter.  Continue Xarelto.  4 hypertension-patient's blood pressure is controlled.  Continue present medications.  5 acute on chronic stage III kidney disease-follow renal function closely with diuresis.  Continue physical therapy today.  Hopefully can discharge in 24 to 48 hours to skilled nursing facility.  For questions or updates, please contact [place] Please consult [email or web address] for contact info under        Signed, Andre Ruths, MD  03/11/2019, 8:34 AM

## 2019-03-11 NOTE — Progress Notes (Signed)
Occupational Therapy Treatment Patient Details Name: Andre [name] MRN: [id number] DOB: 1923/06/30 Today's Date: 03/11/2019    History of present illness Pt is a 85 y.o. male admitted 02/23/19 with SOB. EKG showed complete heart block; s/p pacemaker implant 3/14. Pt with new onset afib; plan for DCCV 3/20. PMH includes PAF, HTN, PAD, CKD III, CHF, afib.   OT comments  Patient progressing slowly.  Limited by decreased activity tolerance, generalized weakness, and SOB with activity.  Cueing throughout session for PLB and education provided with energy conservation.  Min guard for transfers and mobility without AD (pt declining use of RW), min assist for toileting.  On 4L of supplemental oxygen via [place] initially and required increased to 6L during mobility to maintain >90%. Continue to recommend SNF rehab.  Will follow.    Follow Up Recommendations  SNF;Supervision/Assistance - 24 hour    Equipment Recommendations  Other (comment)(TBD at next venue of care)    Recommendations for Other Services Other (comment)(services for the blind )    Precautions / Restrictions Precautions Precautions: ICD/Pacemaker Restrictions LUE Weight Bearing: Non weight bearing       Mobility Bed Mobility Overal bed mobility: Modified Independent                Transfers Overall transfer level: Needs assistance Equipment used: None Transfers: Sit to/from Stand Sit to Stand: Min guard         General transfer comment: min guard for safety and balance     Balance Overall balance assessment: Needs assistance Sitting-balance support: Feet supported;No upper extremity supported Sitting balance-Leahy Scale: Fair     Standing balance support: No upper extremity supported;During functional activity Standing balance-Leahy Scale: Fair Standing balance comment: min guard to supervision for safety, within base of support                           ADL either performed or assessed  with clinical judgement   ADL Overall ADL's : Needs assistance/impaired     Grooming: Minimal assistance;Standing Grooming Details (indicate cue type and reason): assist for visual deficits                 Toilet Transfer: Min guard;Ambulation;Grab bars   Toileting- Clothing Manipulation and Hygiene: Minimal assistance;Sit to/from stand Toileting - Clothing Manipulation Details (indicate cue type and reason): to manage clothing     Functional mobility during ADLs: Minimal assistance;Cueing for safety General ADL Comments: fatigues with little self-awareness     Vision       Perception     Praxis      Cognition Arousal/Alertness: Awake/alert Behavior During Therapy: WFL for tasks assessed/performed Overall Cognitive Status: Within Functional Limits for tasks assessed                                          Exercises     Shoulder Instructions       General Comments oxgyen saturations monitored throughout session; education on PLB and energy conservation--on 4L upon entry and required increased to 6L to maintain > 90% during activity     Pertinent Vitals/ Pain       Pain Assessment: No/denies pain  Home Living  Prior Functioning/Environment              Frequency  Min 2X/week        Progress Toward Goals  OT Goals(current goals can now be found in the care plan section)  Progress towards OT goals: Progressing toward goals  Acute Rehab OT Goals Patient Stated Goal: return home to see wife OT Goal Formulation: With patient Time For Goal Achievement: 03/21/19 Potential to Achieve Goals: Good  Plan Discharge plan remains appropriate;Frequency remains appropriate    Co-evaluation                 AM-PAC OT "6 Clicks" Daily Activity     Outcome Measure   Help from another person eating meals?: None Help from another person taking care of personal grooming?: A  Little Help from another person toileting, which includes using toliet, bedpan, or urinal?: A Little Help from another person bathing (including washing, rinsing, drying)?: A Little Help from another person to put on and taking off regular upper body clothing?: A Little Help from another person to put on and taking off regular lower body clothing?: A Little 6 Click Score: 19    End of Session Equipment Utilized During Treatment: Oxygen(4L)  OT Visit Diagnosis: Unsteadiness on feet (R26.81);Muscle weakness (generalized) (M62.81)   Activity Tolerance Patient tolerated treatment well;Other (comment)(limited self awareness to fatigue )   Patient Left in chair;with call bell/phone within reach;with nursing/sitter in room   Nurse Communication Mobility status;Precautions        Time: 1536-1600 OT Time Calculation (min): 24 min  Charges: OT General Charges $OT Visit: 1 Visit OT Treatments $Self Care/Home Management : 8-22 mins $Therapeutic Activity: 8-22 mins  Delight Stare, [place] Pager [phone] Office [phone]    Delight Stare 03/11/2019, 5:17 PM

## 2019-03-11 NOTE — Care Management Important Message (Signed)
Important Message  Patient Details  Name: Andre [name] MRN: [id number] Date of Birth: Apr 21, 1923   Medicare Important Message Given:  Yes    Orbie Pyo 03/11/2019, 2:39 PM

## 2019-03-11 NOTE — Progress Notes (Signed)
   03/11/19 1200  Mobility  Activity Ambulated in hall (In chair before and after ambulation)  Range of Motion Active;All extremities  Level of Assistance Standby assist, set-up cues, supervision of patient - no hands on (Min G)  Assistive Device None;Other (Comment) (Declined )  LUE Weight Bearing NWB  Minutes Stood 8 minutes  Minutes Ambulated 8 minutes  Distance Ambulated (ft) 360 ft  Mobility Response Tolerated fair;RN notified (SOB and cough as fatigue increases)  Bed Position Chair   SATURATION QUALIFICATIONS: (This note is used to comply with regulatory documentation for home oxygen)  Patient Saturations on Room Air at Rest = 88%  Patient Saturations on Room Air while Ambulating = n/a  Patient Saturations on 6 Liters of oxygen while Ambulating = 91%  Please briefly explain why patient needs home oxygen: Was briefly took off oxygen and desaturated to 88%. While ambulating on 4 liters, patient desaturated to 81% so oxygen was bumped up to 6 liters. On 6 liters the patients oxygen fluctuated between low 80's and 90's and needed multiple standing rest breaks with pursed breathing to maintain a saturation of 91%

## 2019-03-12 DIAGNOSIS — Z950 Presence of cardiac pacemaker: Secondary | ICD-10-CM

## 2019-03-12 DIAGNOSIS — R0902 Hypoxemia: Secondary | ICD-10-CM

## 2019-03-12 LAB — BASIC METABOLIC PANEL
Anion gap: 10 (ref 5–15)
BUN: 50 mg/dL — ABNORMAL HIGH (ref 8–23)
CALCIUM: 8.8 mg/dL — AB (ref 8.9–10.3)
CO2: 30 mmol/L (ref 22–32)
Chloride: 98 mmol/L (ref 98–111)
Creatinine, Ser: 2.04 mg/dL — ABNORMAL HIGH (ref 0.61–1.24)
GFR calc Af Amer: 32 mL/min — ABNORMAL LOW (ref >60)
GFR calc non Af Amer: 27 mL/min — ABNORMAL LOW (ref >60)
Glucose, Bld: 110 mg/dL — ABNORMAL HIGH (ref 70–99)
Potassium: 4.4 mmol/L (ref 3.5–5.1)
Sodium: 138 mmol/L (ref 135–145)

## 2019-03-12 MED ORDER — ISOSORBIDE MONONITRATE ER 120 MG PO TB24: 120.0000 mg | ORAL_TABLET | Freq: Every day | ORAL | 2 refills | Status: DC

## 2019-03-12 MED ORDER — LEVALBUTEROL HCL 0.63 MG/3ML IN NEBU: 0.6300 mg | INHALATION_SOLUTION | Freq: Two times a day (BID) | RESPIRATORY_TRACT | 3 refills | Status: DC

## 2019-03-12 MED ORDER — FUROSEMIDE 40 MG PO TABS: 40.0000 mg | ORAL_TABLET | Freq: Every day | ORAL | 2 refills | Status: DC

## 2019-03-12 MED ORDER — AMIODARONE HCL 200 MG PO TABS: 200.0000 mg | ORAL_TABLET | Freq: Two times a day (BID) | ORAL | 0 refills | Status: DC

## 2019-03-12 MED ORDER — AMIODARONE HCL 200 MG PO TABS: 200.0000 mg | ORAL_TABLET | Freq: Every day | ORAL | 2 refills | Status: DC

## 2019-03-12 NOTE — TOC Transition Note (Signed)
Transition of Care [facility]) - CM/SW Discharge Note   Patient Details  Name: Andre [name] MRN: [id number] Date of Birth: 06-Sep-1923  Transition of Care [facility]) CM/SW Contact:  Estanislado Emms, LCSW Phone Number: 03/12/2019, 12:10 PM   Clinical Narrative:     Patient will discharge to [facility]. Nurse to call report to [phone]. Patient will go to room 7003 at the facility.   Final next level of care: Skilled Nursing Facility Barriers to Discharge: No Barriers Identified   Patient Goals and CMS Choice Patient states their goals for this hospitalization and ongoing recovery are:: Pt wants to return home and get better CMS Medicare.gov Compare Post Acute Care list provided to:: Patient Choice offered to / list presented to : Patient  Discharge Placement PASRR number recieved: 03/08/19            Patient chooses bed at: [facility] Patient to be transferred to facility by: [facility]   Patient and family notified of of transfer: 03/12/19  Discharge Plan and Services In-house Referral: NA Discharge Planning Services: NA Post Acute Care Choice: NA          DME Arranged: N/A DME Agency: NA HH Arranged: NA HH Agency: NA   Social Determinants of Health (SDOH) Interventions     Readmission Risk Interventions Readmission Risk Prevention Plan 03/08/2019  Transportation Screening Complete  PCP or Specialist Appt within 5-7 Days Complete  Home Care Screening Complete  Medication Review (RN CM) Complete  Some recent data might be hidden

## 2019-03-12 NOTE — Discharge Summary (Signed)
Discharge Summary    Patient ID: Andre [name] MRN: [id number]; DOB: [birth year]  Admit date: 02/23/2019 Discharge date: 03/12/2019  Primary Care Provider: [name], MD  Primary Cardiologist: Kirk Ruths, MD  Primary Electrophysiologist:  None   Discharge Diagnoses    Principal Problem:   CHB (complete heart block) [facility]) Active Problems:   Hyperlipidemia   Essential hypertension   Acute-on-chronic kidney disease stage III   Acute on chronic diastolic heart failure [facility])   Atrial fibrillation/flutter   Pacemaker   Hypoxia   Allergies No Known Allergies  Diagnostic Studies/Procedures    Pacemaker Implant 02/24/2019: Dual pacemaker (Medtronic MRI compatible device) implantation for complete heart block. The patient tolerated the procedure well without any apparent complications. _____________  Echocardiogram 02/24/2019: Impressions:  1. The left ventricle has normal systolic function with an ejection fraction of 60-65%. The cavity size was normal. Left ventricular diastolic Doppler parameters are indeterminate.  2. The right ventricle has normal systolic function. The cavity was normal. There is no increase in right ventricular wall thickness.  3. No pericardial effusion seen, though image quality is limited. _______________  TEE/DCCV 03/01/2019: Impressions: 1. The left ventricle has normal systolic function, with an ejection fraction of 60-65%. No evidence of left ventricular regional wall motion abnormalities.  2. The right ventricle has normal systolc function. The cavity was normal.  3. Left atrial size was moderately dilated.  4. Right atrial size was moderately dilated.  5. The mitral valve is grossly normal.  6. The aortic valve is tricuspid Mild calcification of the aortic valve. Aortic valve regurgitation is trivial by color flow Doppler. no stenosis of the aortic valve.  7. There is evidence of severe plaque in the descending aorta.  8. Normal LV  function; LVH; moderate biatrial enlargement; no LAA thrombus; emptying velocity > 80 cm/sec; mild MR and TR; severe atherosclerosis descending aorta.  Patient subsequently had DCCV with 120J to sinus with ventricular pacing. He tolerated the procedure well with no immediate complications. Continue Xarelto.   History of Present Illness     Andre [name] is a 85 year old male with a history of paroxysmal atrial fibrillation s/p DCCV in 2017 and on Xarelto, chronic diastolic CHF with EF of 34-19% on TEE in 2017, hypertension, hyperlipidemia, and PAD who is followed by [name]. He was last seen by Almyra Deforest, PA-C, on 02/22/2019 with multiple complaints including shortness of breath and fatigue with exertion as well as worsening leg pain with exertion. He was bradycardic on exam and EKG showed 2:1 AV block with heart rates in the 40's. Patient was discussed with Dr. Percival Spanish and Dr. Lovena Le over the phone and plan was made to set up urgent EP referral. He presented to the [facility] ED the next day on 02/23/2019 with worsening shortness of breath.    Patient and daughter report progressive shortness of breath and fatigue over the past week. Patient typically walks 1/2 mile per day without any symptoms but noticed worsening shortness of breath for the week prior to presentation. His shortness of breath acutely worsened throughout the night and morning of presentation which prompted him to call his daughter. Patient denies any chest pain, palpitations, orthopnea, or PND. He does have intermittent lower extremity edema at home. He also reports mild dizziness but denies any recent syncopal episodes.   On arrival to the ED, O2 saturation was 86% on room air so patient was placed on 2L with improvement. EKG showed complete heart block with  rates in the 30's to 40's. WBC 14.7, Hgb 12.6, Plts 256. Na 132, K 4.2, Mg 2.4, SCr 2.29 (baseline 1.3 to 1.4). BNP elevated at 1,628. Chest x-ray showed mild interstitial edema  with small bilateral pleural effusions and possible underlying lung disease.  He is on Amlodipine, Atorvastatin, Losartan-HCTZ, and Xarelto as an outpatient but no AV nodal blocking agents.   Hospital Course     Consultants: None  Complete Heart Block Mr. Granja was admitted for complete heart block as stated above. Patient underwent permanent pacemaker implantation on 02/24/2019. He tolerated this procedure well without any apparent complications. Wound was closely monitor throughout hospitalization and all chest x-rays have been negative for pneumothorax.  - Will need follow-up with EP in 8-12 weeks.  Acute on Chronic Diastolic CHF Echocardiogram this admission showed LVEF of 60-65%. Patient was diuresed with IV Lasix with good response. However, he developed pulmonary edema after being cardioverted on 03/01/2019 and his O2 sats dropped requiring 6 L of supplemental O2 to maintain sats in the low 90s. He ultimately had to be moved to the cardiac ICU ([facility]) overnight on 03/02/2019 due to continued issues with pulmonary edema with low O2 saturations and some agitation. It was unclear why he continued to have pulmonary edema despite being in normal rhythm. Xopenex was started and Coreg (which was started on 02/27/2019) was discontinued due to possible reactive airways. Troponin was rechecked to rule out an intercurrent major coronary event and was only minimally elevated at 0.04 (likely demand ischemia). Imdur was started on 03/05/2019. Patient was given one dose of Metalzone in addition to IV Lasix on 03/06/2019. Repeat chest x-ray was felt to be most consistent with CHF with underlying COPD. Shortness of breath improved some and he was eventually transferred back to Cardiac Telemetry floor on 03/08/2019.  - Continue oral Lasix 40mg  daily. - Continue Imdur 120mg  daily. - Will need repeat BMET to check potassium and renal function on 03/18/2019 at SNF.  Paroxysmal Atrial Fibrillation/Flutter Patient has a  history of paroxysmal atrial fibrillation s/p DCCV in 2017. He was noted to be in atrial flutter with controlled ventricular rate on 02/25/2019 following pacemaker implantation. Patient continued to have some dyspnea on exertion after pacemaker implantation which he felt was similar to how it was 3 years ago when he was out of rhythm. Therefore, he requested a TEE/DCCV as he has had this before. Patient underwent successful DCCV on 03/01/2019 after TEE showed no evidence of a left atrial appendage thrombus. However, he reverted back to atrial flutter on 03/06/2019. Therefore, Amiodarone 400mg  twice daily was started which he tolerated well. Patient has maintained sinus rhythm since then.  - Will decrease Amiodarone to 200mg  twice daily x1 week and then transitioned to 200mg  daily. - Continue chronic anticoagulation with Xarelto 15mg  daily.  Hypoxia Requiring Home O2 Patient has continued to desaturate to the 80's on room air throughout admission. Patient was briefly taken off supplemental O2 yesterday on 03/11/2019 and desaturated to 88%. In addition while ambulating on 4 L, patient desaturated to 81% and O2 had to be increased to 6 L. On 6 L, O2 saturations fluctuated between the low 80's and 90's and patient needed multiple standing rest breaks with pursed breathing to maintain a saturation of 91%. - He will need home O2 on discharge. - Continue Xopenex nebulizer twice daily.   Hypertension Patient's BP well controlled at this time. Most recent BP 110/60.  - Continue home Amlodipine 10mg  daily. - Will discontinue home Losartan-HCTZ  at this time given renal function and appropriate BP.  Hyperlipidemia - Continue home Lipitor 10mg  daily.  Acute on Chronic Kidney Disease Stage III Serum creatinine 2.29 on admission. Baseline 1.3 to 1.4. Creatinine has ranged from 1.3 to 2.3 this admission. Creatinine 2.04 today.  - Will need to closely monitor renal function. Will need BMET on 03/18/2019 at  SNF.  Patient seen and examined by [name] today and determined stable for discharge. Patient is being discharged to a [facility]). Outpatient follow-ups have been arranged. Medications as below.  _____________  Discharge Vitals Blood pressure 110/60, pulse 84, temperature 98.1 F (36.7 C), temperature source Oral, resp. rate 18, height 5\' 5"  (1.651 m), weight 64 kg, SpO2 100 %.  Filed Weights   03/09/19 0448 03/11/19 0558 03/12/19 0345  Weight: 65 kg 64.5 kg 64 kg    Labs & Radiologic Studies    CBC No results for input(s): WBC, NEUTROABS, HGB, HCT, MCV, PLT in the last 72 hours. Basic Metabolic Panel Recent Labs    03/11/19 0531 03/12/19 0347  NA 138 138  K 3.5 4.4  CL 100 98  CO2 29 30  GLUCOSE 125* 110*  BUN 51* 50*  CREATININE 1.85* 2.04*  CALCIUM 8.6* 8.8*   Liver Function Tests No results for input(s): AST, ALT, ALKPHOS, BILITOT, PROT, ALBUMIN in the last 72 hours. No results for input(s): LIPASE, AMYLASE in the last 72 hours. Cardiac Enzymes No results for input(s): CKTOTAL, CKMB, CKMBINDEX, TROPONINI in the last 72 hours. BNP Invalid input(s): POCBNP D-Dimer No results for input(s): DDIMER in the last 72 hours. Hemoglobin A1C No results for input(s): HGBA1C in the last 72 hours. Fasting Lipid Panel No results for input(s): CHOL, HDL, LDLCALC, TRIG, CHOLHDL, LDLDIRECT in the last 72 hours. Thyroid Function Tests No results for input(s): TSH, T4TOTAL, T3FREE, THYROIDAB in the last 72 hours.  Invalid input(s): FREET3 _____________  Dg Chest 2 View  Result Date: 03/10/2019 CLINICAL DATA:  Shortness of breath, productive cough. EXAM: CHEST - 2 VIEW COMPARISON:  Radiograph of March 06, 2019. FINDINGS: Stable cardiomediastinal silhouette. Left-sided pacemaker is unchanged in position. Atherosclerosis of thoracic aorta is noted. No pneumothorax or significant pleural effusion is noted. Stable diffuse interstitial opacities are noted  throughout both lungs concerning for pulmonary edema or possibly atypical inflammation. Stable mild bibasilar subsegmental atelectasis is noted. Bony thorax is unremarkable. IMPRESSION: Stable diffuse interstitial opacities are noted throughout both lungs concerning for pulmonary edema or possibly atypical inflammation. Aortic Atherosclerosis (ICD10-I70.0). Electronically Signed   By: Marijo Conception, M.D.   On: 03/10/2019 10:33   Dg Chest 2 View  Result Date: 02/28/2019 CLINICAL DATA:  Shortness of breath. EXAM: CHEST - 2 VIEW COMPARISON:  02/25/2019. FINDINGS: Cardiac pacer with lead tip over the right atrium right ventricle. Cardiomegaly. Diffuse bilateral pulmonary interstitial prominence slightly improved from prior exam. Right-sided pleural effusion again noted. Similar finding noted on prior exam no pneumothorax IMPRESSION: Cardiac pacer with lead tip over the right atrium right ventricle. Cardiomegaly with diffuse bilateral pulmonary interstitial prominence. Interstitial prominence has improved slightly from prior exam. Findings suggest improving congestive heart failure. Pneumonitis can not be excluded. Right-sided pleural effusion again noted. Similar finding noted on prior exam. Electronically Signed   By: [place]   On: 02/28/2019 11:23   Dg Chest Port 1 View  Result Date: 03/06/2019 CLINICAL DATA:  Shortness of breath. History of CHF. Pacemaker placement 1 week ago. EXAM: PORTABLE CHEST 1 VIEW COMPARISON:  03/03/2019; 03/02/2019; 02/28/2019 02/23/2019 FINDINGS: Grossly unchanged enlarged cardiac silhouette. Stable position of support apparatus. Atherosclerotic plaque within the thoracic aorta. Pulmonary vasculature remains indistinct with cephalization of flow. Ill-defined interstitial opacities within the right upper and lower lung are unchanged. Unchanged small layering bilateral effusions and associated bibasilar opacities. No new focal airspace opacities. No pneumothorax. No acute  osseous abnormalities. IMPRESSION: 1. Similar findings most suggestive of pulmonary edema with cephalization of flow, small bilateral effusions and associated bibasilar opacities, likely atelectasis. 2. Grossly unchanged asymmetric ill-defined interstitial opacities within the right upper lung, potentially representative of asymmetric alveolar pulmonary edema, though underlying infection, including atypical etiologies, could have a similar appearance. Clinical correlation is advised. Electronically Signed   By: Sandi Mariscal M.D.   On: 03/06/2019 07:53   Dg Chest Port 1 View  Result Date: 03/03/2019 CLINICAL DATA:  Initial evaluation for acute hypoxia. EXAM: PORTABLE CHEST 1 VIEW COMPARISON:  Prior radiograph from 03/02/2019 FINDINGS: Left-sided pacemaker/AICD noted, stable. Transverse heart size unchanged. Mediastinal silhouette normal. Aortic atherosclerosis. Current exam has been performed with a slightly more shallow degree of lung inflation. Extensive bilateral airspace disease again seen, mildly worsened within the upper lobes bilaterally as compared to previous exam. Findings suspected to reflect a degree of worsened pulmonary interstitial edema with worsened perihilar vascular congestion. Small right pleural effusion has mildly increased from previous. Trace left effusion relatively unchanged. No pneumothorax. Osseous structures unchanged. IMPRESSION: 1. Slight interval worsening in bilateral airspace disease, favored to reflect mildly worsened pulmonary interstitial edema. 2. Slight interval increase in size of small right pleural effusion. Electronically Signed   By: Jeannine Boga M.D.   On: 03/03/2019 02:44   Dg Chest Port 1 View  Result Date: 03/02/2019 CLINICAL DATA:  Patient status post pacemaker placement 02/24/2019. Atrial flutter. Status post cardioversion 03/01/2019. EXAM: PORTABLE CHEST 1 VIEW COMPARISON:  PA and lateral chest 02/28/2019 and 12/08/2010. Single-view of the chest  02/27/2019. FINDINGS: Diffuse bilateral airspace disease has mildly improved in the right upper lung zone but worsened throughout the left chest. Heart size is upper normal. Aortic atherosclerosis noted. Small right pleural effusion is decreased since yesterday's examination. No pneumothorax. IMPRESSION: Overall increase in diffuse bilateral airspace disease likely due to pulmonary edema. Small left pleural effusion is decreased since yesterday's exam. Electronically Signed   By: Inge Rise M.D.   On: 03/02/2019 08:54   Dg Chest Port 1 View  Result Date: 02/25/2019 CLINICAL DATA:  Status post pacemaker placement EXAM: PORTABLE CHEST 1 VIEW COMPARISON:  02/23/2019 FINDINGS: Cardiac shadow remains enlarged. Pacing device is now seen. No pneumothorax is noted. Vascular congestion is noted increased from the prior exam with increasing interstitial edema. Small right pleural effusion is noted. No focal confluent infiltrate is seen. IMPRESSION: Increasing vascular congestion with interstitial edema. No pneumothorax following pacemaker placement. Electronically Signed   By: Inez Catalina M.D.   On: 02/25/2019 07:18   Dg Chest Port 1 View  Result Date: 02/23/2019 CLINICAL DATA:  Shortness of breath EXAM: PORTABLE CHEST 1 VIEW COMPARISON:  12/08/2010 FINDINGS: Mild subpleural reticulation/fibrosis in the lungs bilaterally. Pulmonary vascular congestion/possible mild interstitial edema. Suspected small bilateral pleural effusions. No pneumothorax. The heart is normal in size. IMPRESSION: Suspected mild interstitial edema with small bilateral pleural effusions. Possible underlying chronic interstitial lung disease. Electronically Signed   By: Julian Hy M.D.   On: 02/23/2019 08:54   Disposition   Patient is being discharged home today in good condition.  Follow-up Plans & Appointments  Contact information for follow-up providers    Deboraha Sprang, MD Follow up.   Specialty:  Cardiology Why:   You have a follow-up visit scheduled for 05/30/2019 at 2:45pm with Dr. Caryl Comes. Please arrive 15 minutes early for check-in. Contact information: [address] [address] [phone]        Almyra Deforest, [place] Follow up.   Specialties:  Cardiology, Radiology Why:  You have a follow-up visit scheduled for 04/25/2019 at 2:30pm with Almyra Deforest, one of our office's PAs. Please arrive 15 minutes early for check-in. Contact information: [address] [place] [address] [phone]            Contact information for after-discharge care    Destination    [facility] .   Service:  Skilled Nursing Contact information: [address] [place] [phone]                 Discharge Instructions    Diet - low sodium heart healthy   Complete by:  As directed    Increase activity slowly   Complete by:  As directed       Discharge Medications   Allergies as of 03/12/2019   No Known Allergies     Medication List    STOP taking these medications   losartan-hydrochlorothiazide 100-25 MG tablet Commonly known as:  HYZAAR     TAKE these medications   amiodarone 200 MG tablet Commonly known as:  PACERONE Take 1 tablet (200 mg total) by mouth 2 (two) times daily. Take 200mg  twice daily for 1 week then decrease to 200mg  daily.   amiodarone 200 MG tablet Commonly known as:  Pacerone Take 1 tablet (200 mg total) by mouth daily. Start taking on:  March 19, 2019   amLODipine 10 MG tablet Commonly known as:  NORVASC Take 1 tablet (10 mg total) by mouth daily.   atorvastatin 10 MG tablet Commonly known as:  LIPITOR Take 1 tablet (10 mg total) by mouth daily.   furosemide 40 MG tablet Commonly known as:  LASIX Take 1 tablet (40 mg total) by mouth daily. Start taking on:  March 13, 2019   isosorbide mononitrate 120 MG 24 hr tablet Commonly known as:  IMDUR Take 1 tablet  (120 mg total) by mouth daily. Start taking on:  March 13, 2019   levalbuterol 0.63 MG/3ML nebulizer solution Commonly known as:  XOPENEX Take 3 mLs (0.63 mg total) by nebulization 2 (two) times daily.   multivitamin with minerals Tabs tablet Take 1 tablet by mouth daily. ABC Plus Senior   Rivaroxaban 15 MG Tabs tablet Commonly known as:  Xarelto Take 1 tablet (15 mg total) by mouth daily with supper.            Durable Medical Equipment  (From admission, onward)         Start     Ordered   03/01/19 1644  For home use only DME oxygen  Once    Question:  Oxygen delivery system  Answer:  Gas   03/01/19 1646           Acute coronary syndrome (MI, NSTEMI, STEMI, etc) this admission?: No.    Outstanding Labs/Studies   Check BMET on 03/18/2019 at SNF.  Duration of Discharge Encounter   Greater than 30 minutes including physician time.  Signed, Darreld Mclean, PA-C 03/12/2019, 12:52 PM

## 2019-03-12 NOTE — Progress Notes (Signed)
Physical Therapy Treatment Patient Details Name: Andre [name] MRN: [id number] DOB: 1923/01/09 Today's Date: 03/12/2019    History of Present Illness Pt is a 85 y.o. male admitted 02/23/19 with SOB. EKG showed complete heart block; s/p pacemaker implant 3/14. Pt with new onset afib; s/p TEE/DCCV 3/20. PMH includes PAF, HTN, PAD, CKD III, CHF, afib.   PT Comments    Pt progressing with mobility. Pt performing standing and seated ADL tasks at supervision-level. SpO2 down to 80% on 4L O2 [place]. Educ on energy conservation strategies, particularly regarding ADL tasks and mobility. Also discussed pt's concerns regarding home O2 use and remaining mobile. Planning for d/c to SNF this afternoon.    Follow Up Recommendations  SNF     Equipment Recommendations  None recommended by PT    Recommendations for Other Services       Precautions / Restrictions Precautions Precautions: ICD/Pacemaker Restrictions LUE Weight Bearing: Non weight bearing    Mobility  Bed Mobility Overal bed mobility: Independent                Transfers Overall transfer level: Needs assistance Equipment used: None Transfers: Sit to/from Stand Sit to Stand: Supervision         General transfer comment: Pt performing multiple sit<>stands while getting dressed, supervision for safety  Ambulation/Gait                 Stairs             Wheelchair Mobility    Modified Rankin (Stroke Patients Only)       Balance Overall balance assessment: Needs assistance Sitting-balance support: Feet supported;No upper extremity supported Sitting balance-Leahy Scale: Fair     Standing balance support: No upper extremity supported;During functional activity Standing balance-Leahy Scale: Fair                              Cognition Arousal/Alertness: Awake/alert Behavior During Therapy: WFL for tasks assessed/performed Overall Cognitive Status: Within Functional Limits for tasks  assessed                                        Exercises      General Comments General comments (skin integrity, edema, etc.): Pt preparing for d/c to SNF, assisted with getting dressed and gathering belongings in room. SpO2 down to 80% on 4L O2 [place] after performing multiple sit<>stands to don shirt/pants and prolonged sitting to don shoes/socks; returned to 93% with seated rest break and deep breathing. Educ on energy conservation strategies, especially with regards to ADL tasks; educ on home O2 wear      Pertinent Vitals/Pain Pain Assessment: No/denies pain    Home Living                      Prior Function            PT Goals (current goals can now be found in the care plan section) Acute Rehab PT Goals Patient Stated Goal: return home to see wife PT Goal Formulation: With patient Time For Goal Achievement: 03/14/19 Potential to Achieve Goals: Good Progress towards PT goals: Progressing toward goals    Frequency    Min 3X/week      PT Plan Current plan remains appropriate    Co-evaluation  AM-PAC PT "6 Clicks" Mobility   Outcome Measure  Help needed turning from your back to your side while in a flat bed without using bedrails?: None Help needed moving from lying on your back to sitting on the side of a flat bed without using bedrails?: None Help needed moving to and from a bed to a chair (including a wheelchair)?: None Help needed standing up from a chair using your arms (e.g., wheelchair or bedside chair)?: A Little Help needed to walk in hospital room?: A Little Help needed climbing 3-5 steps with a railing? : A Little 6 Click Score: 21    End of Session Equipment Utilized During Treatment: Oxygen Activity Tolerance: Patient tolerated treatment well;Patient limited by fatigue Patient left: in bed;with call bell/phone within reach(seated EOB for transport) Nurse Communication: Mobility status PT Visit Diagnosis:  Other abnormalities of gait and mobility (R26.89);Muscle weakness (generalized) (M62.81)     Time: 1353-1410 PT Time Calculation (min) (ACUTE ONLY): 17 min  Charges:  $Therapeutic Activity: 8-22 mins                    Mabeline Caras, PT, DPT Acute Rehabilitation Services  Pager [phone] Office [phone]  Derry Lory 03/12/2019, 2:21 PM

## 2019-03-12 NOTE — Progress Notes (Signed)
Progress Note  Patient Name: Andre [name] Date of Encounter: 03/12/2019  Primary Cardiologist: Andre Ruths, MD   Subjective   Mild DOE; no CP  Inpatient Medications    Scheduled Meds: . amiodarone  400 mg Oral BID  . amLODipine  10 mg Oral Daily  . atorvastatin  10 mg Oral Daily  . docusate sodium  100 mg Oral Daily  . feeding supplement (ENSURE ENLIVE)  237 mL Oral Q24H  . furosemide  40 mg Oral Daily  . isosorbide mononitrate  120 mg Oral Daily  . levalbuterol  0.63 mg Nebulization BID  . mouth rinse  15 mL Mouth Rinse BID  . multivitamin with minerals  1 tablet Oral Daily  . ramelteon  8 mg Oral QHS  . rivaroxaban  15 mg Oral Q supper  . sodium chloride flush  3 mL Intravenous Q12H  . sodium chloride flush  3 mL Intravenous Q12H   Continuous Infusions: . sodium chloride Stopped (03/06/19 0154)  . sodium chloride     PRN Meds: sodium chloride, acetaminophen, hydrocortisone cream, ondansetron (ZOFRAN) IV, sodium chloride flush, sodium chloride flush   Vital Signs    Vitals:   03/11/19 1953 03/11/19 2129 03/12/19 0345 03/12/19 0756  BP:  (!) 145/54 (!) 134/55 (!) 119/55  Pulse:  84 80 84  Resp:   20 18  Temp:  97.7 F (36.5 C) 98.1 F (36.7 C) 98.1 F (36.7 C)  TempSrc:  Oral Oral Oral  SpO2: 96% 93% 98% 100%  Weight:   64 kg   Height:        Intake/Output Summary (Last 24 hours) at 03/12/2019 0909 Last data filed at 03/12/2019 8299 Gross per 24 hour  Intake 1203 ml  Output 350 ml  Net 853 ml   Last 3 Weights 03/12/2019 03/11/2019 03/09/2019  Weight (lbs) 141 lb 142 lb 4.8 oz 143 lb 4.8 oz  Weight (kg) 63.957 kg 64.547 kg 65 kg      Telemetry    Sinus with Vpacing- Personally Reviewed   Physical Exam   GEN: WD [place] Neck: supple Cardiac: RRR, no gallop Respiratory: Minimal basilar crackles GI: Soft, NT/ND MS: 1+ ankle Neuro:  Grossly intact  Labs    Chemistry Recent Labs  Lab 03/10/19 0243 03/11/19 0531 03/12/19 0347  NA  136 138 138  K 3.6 3.5 4.4  CL 97* 100 98  CO2 29 29 30   GLUCOSE 122* 125* 110*  BUN 50* 51* 50*  CREATININE 2.01* 1.85* 2.04*  CALCIUM 8.5* 8.6* 8.8*  GFRNONAA 28* 31* 27*  GFRAA 32* 36* 32*  ANIONGAP 10 9 10      Hematology No results for input(s): WBC, RBC, HGB, HCT, MCV, MCH, MCHC, RDW, PLT in the last 168 hours.  Cardiac Enzymes No results for input(s): TROPONINI in the last 168 hours.  Radiology    Dg Chest 2 View  Result Date: 03/10/2019 CLINICAL DATA:  Shortness of breath, productive cough. EXAM: CHEST - 2 VIEW COMPARISON:  Radiograph of March 06, 2019. FINDINGS: Stable cardiomediastinal silhouette. Left-sided pacemaker is unchanged in position. Atherosclerosis of thoracic aorta is noted. No pneumothorax or significant pleural effusion is noted. Stable diffuse interstitial opacities are noted throughout both lungs concerning for pulmonary edema or possibly atypical inflammation. Stable mild bibasilar subsegmental atelectasis is noted. Bony thorax is unremarkable. IMPRESSION: Stable diffuse interstitial opacities are noted throughout both lungs concerning for pulmonary edema or possibly atypical inflammation. Aortic Atherosclerosis (ICD10-I70.0). Electronically Signed   By: Andre Rinks  Murlean [name], M.D.   On: 03/10/2019 10:33     Patient Profile     Mr. Andre [name] is a 85 y.o. male with a history of paroxysmal atrial fibrillation s/p DCCV in 2017 and on Xarelto, chronic diastolic CHF with EF of 02-54% on TEE in 2017, hypertension, hyperlipidemia, and PAD who presented to the [facility] ED for evaluation of worsening dyspnea. Initial EKG showed complete heart block. He underwent permanent pacemaker placement on 02/24/2019.  Echocardiogram this admission on March 15 showed normal LV function.  Assessment & Plan    1 acute on chronic diastolic congestive heart failure-volume status reasonable at this point with only mild pedal edema.  We will continue Lasix at 40 mg daily.  He will need  potassium and renal function checked early next week.  2 status post pacemaker-he will need follow-up with electrophysiology following discharge.  3 paroxysmal atrial fibrillation-patient remains in sinus rhythm on telemetry.  We will decrease amiodarone to 200 mg twice daily for 1 week and then 200 mg daily thereafter.  Continue Xarelto.    4 hypertension-patient's blood pressure is reasonable.  Continue present medications and follow.  5 acute on chronic stage III kidney disease-check potassium and renal function on Monday.  Plan discharge to skilled nursing facility today. Check BMET 4/6 at facility. Patient will need follow-up in 8 to 12 weeks. >30 min PA and physician time D2  For questions or updates, please contact [facility] Please consult [email or web address] for contact info under        Signed, Andre Ruths, MD  03/12/2019, 9:09 AM

## 2019-03-13 ENCOUNTER — Encounter (HOSPITAL_COMMUNITY): Payer: Medicare Other

## 2019-03-20 ENCOUNTER — Telehealth: Payer: Self-pay | Admitting: Cardiology

## 2019-03-20 NOTE — Telephone Encounter (Signed)
Called patient, spoke with daughter. She just wanted to let Dr.Crenshaw know that her dad was in the hospital at [facility] - unable to be taken to [facility] due to where he was picked up. Daughter states that she also wants the people at [facility] to know that her dad did not have his monitor as it was left at the home that he lives at.  Advised I would route this message to Dr.Crenshaw, and to [facility].

## 2019-03-20 NOTE — Telephone Encounter (Signed)
° ° °  Patient's daughter calling to report her father is in the hospital at [facility], requesting call from nurse

## 2019-04-03 ENCOUNTER — Inpatient Hospital Stay
Admission: AD | Admit: 2019-04-03 | Discharge: 2019-04-22 | Disposition: A | Discharge status: Skilled Nursing Facility | Payer: Medicare Other | Source: Other Acute Inpatient Hospital | Attending: Internal Medicine | Admitting: Internal Medicine

## 2019-04-03 DIAGNOSIS — J90 Pleural effusion, not elsewhere classified: Secondary | ICD-10-CM

## 2019-04-03 DIAGNOSIS — J9621 Acute and chronic respiratory failure with hypoxia: Secondary | ICD-10-CM | POA: Diagnosis present

## 2019-04-03 DIAGNOSIS — J811 Chronic pulmonary edema: Secondary | ICD-10-CM

## 2019-04-03 DIAGNOSIS — J189 Pneumonia, unspecified organism: Secondary | ICD-10-CM

## 2019-04-03 DIAGNOSIS — A419 Sepsis, unspecified organism: Secondary | ICD-10-CM | POA: Diagnosis present

## 2019-04-03 DIAGNOSIS — I5032 Chronic diastolic (congestive) heart failure: Secondary | ICD-10-CM | POA: Diagnosis present

## 2019-04-03 DIAGNOSIS — J181 Lobar pneumonia, unspecified organism: Secondary | ICD-10-CM | POA: Diagnosis present

## 2019-04-03 DIAGNOSIS — I482 Chronic atrial fibrillation, unspecified: Secondary | ICD-10-CM | POA: Diagnosis present

## 2019-04-03 DIAGNOSIS — R6520 Severe sepsis without septic shock: Secondary | ICD-10-CM

## 2019-04-03 DIAGNOSIS — J80 Acute respiratory distress syndrome: Secondary | ICD-10-CM

## 2019-04-03 DIAGNOSIS — R112 Nausea with vomiting, unspecified: Secondary | ICD-10-CM

## 2019-04-03 HISTORY — DX: Sepsis, unspecified organism: R65.20

## 2019-04-03 HISTORY — DX: Acute and chronic respiratory failure with hypoxia: J96.21

## 2019-04-03 HISTORY — DX: Chronic diastolic (congestive) heart failure: I50.32

## 2019-04-03 HISTORY — DX: Lobar pneumonia, unspecified organism: J18.1

## 2019-04-03 HISTORY — DX: Sepsis, unspecified organism: A41.9

## 2019-04-03 HISTORY — DX: Chronic atrial fibrillation, unspecified: I48.20

## 2019-04-03 MED ORDER — DEXTROSE 10 % IV SOLN
125.00 | INTRAVENOUS | Status: DC
Start: ? — End: 2019-04-03

## 2019-04-03 MED ORDER — NYSTATIN 100000 UNIT/GM EX POWD
CUTANEOUS | Status: DC
Start: ? — End: 2019-04-03

## 2019-04-03 MED ORDER — GLUCOSE 40 % PO GEL
15.00 | ORAL | Status: DC
Start: ? — End: 2019-04-03

## 2019-04-03 MED ORDER — AMLODIPINE BESYLATE 5 MG PO TABS
5.00 | ORAL_TABLET | ORAL | Status: DC
Start: 2019-04-04 — End: 2019-04-03

## 2019-04-03 MED ORDER — RIVAROXABAN 15 MG PO TABS
15.00 | ORAL_TABLET | ORAL | Status: DC
Start: 2019-04-04 — End: 2019-04-03

## 2019-04-03 MED ORDER — GLUCAGON HCL RDNA (DIAGNOSTIC) 1 MG IJ SOLR
1.00 | INTRAMUSCULAR | Status: DC
Start: ? — End: 2019-04-03

## 2019-04-03 MED ORDER — ISOSORBIDE MONONITRATE ER 30 MG PO TB24
15.00 | ORAL_TABLET | ORAL | Status: DC
Start: 2019-04-04 — End: 2019-04-03

## 2019-04-03 MED ORDER — PANTOPRAZOLE SODIUM 40 MG PO TBEC
40.00 | DELAYED_RELEASE_TABLET | ORAL | Status: DC
Start: 2019-04-04 — End: 2019-04-03

## 2019-04-03 MED ORDER — GENERIC EXTERNAL MEDICATION
Status: DC
Start: 2019-04-04 — End: 2019-04-03

## 2019-04-03 MED ORDER — MELATONIN 3 MG PO TABS
3.00 | ORAL_TABLET | ORAL | Status: DC
Start: ? — End: 2019-04-03

## 2019-04-03 MED ORDER — ACETAMINOPHEN 325 MG PO TABS
650.00 | ORAL_TABLET | ORAL | Status: DC
Start: ? — End: 2019-04-03

## 2019-04-03 MED ORDER — ONDANSETRON HCL 4 MG/2ML IJ SOLN
4.00 | INTRAMUSCULAR | Status: DC
Start: ? — End: 2019-04-03

## 2019-04-03 MED ORDER — DIHYDROTACHYSTEROL 0.125 MG PO TABS
10.00 | ORAL_TABLET | ORAL | Status: DC
Start: 2019-04-04 — End: 2019-04-03

## 2019-04-03 MED ORDER — AMIODARONE HCL 200 MG PO TABS
200.00 | ORAL_TABLET | ORAL | Status: DC
Start: 2019-04-04 — End: 2019-04-03

## 2019-04-03 MED ORDER — INSULIN LISPRO 100 UNIT/ML ~~LOC~~ SOLN
1.00 | SUBCUTANEOUS | Status: DC
Start: 2019-04-04 — End: 2019-04-03

## 2019-04-03 MED ORDER — FUROSEMIDE 10 MG/ML IJ SOLN
40.00 | INTRAMUSCULAR | Status: DC
Start: 2019-04-04 — End: 2019-04-03

## 2019-04-04 LAB — URINALYSIS, ROUTINE W REFLEX MICROSCOPIC
Bilirubin Urine: NEGATIVE
Glucose, UA: NEGATIVE mg/dL
Ketones, ur: NEGATIVE mg/dL
Nitrite: NEGATIVE
Protein, ur: 100 mg/dL — AB
RBC / HPF: >50 RBC/hpf — ABNORMAL HIGH (ref 0–5)
Specific Gravity, Urine: 1.015 (ref 1.005–1.030)
WBC, UA: >50 WBC/hpf — ABNORMAL HIGH (ref 0–5)
pH: 5 (ref 5.0–8.0)

## 2019-04-04 LAB — COMPREHENSIVE METABOLIC PANEL
ALT: 20 U/L (ref 0–44)
AST: 18 U/L (ref 15–41)
Albumin: 1.8 g/dL — ABNORMAL LOW (ref 3.5–5.0)
Alkaline Phosphatase: 57 U/L (ref 38–126)
Anion gap: 11 (ref 5–15)
BUN: 26 mg/dL — ABNORMAL HIGH (ref 8–23)
CO2: 28 mmol/L (ref 22–32)
Calcium: 7.9 mg/dL — ABNORMAL LOW (ref 8.9–10.3)
Chloride: 99 mmol/L (ref 98–111)
Creatinine, Ser: 1.62 mg/dL — ABNORMAL HIGH (ref 0.61–1.24)
GFR calc Af Amer: 42 mL/min — ABNORMAL LOW (ref >60)
GFR calc non Af Amer: 36 mL/min — ABNORMAL LOW (ref >60)
Glucose, Bld: 98 mg/dL (ref 70–99)
Potassium: 3.3 mmol/L — ABNORMAL LOW (ref 3.5–5.1)
Sodium: 138 mmol/L (ref 135–145)
Total Bilirubin: 0.5 mg/dL (ref 0.3–1.2)
Total Protein: 6.1 g/dL — ABNORMAL LOW (ref 6.5–8.1)

## 2019-04-04 LAB — CBC
HCT: 26 % — ABNORMAL LOW (ref 39.0–52.0)
Hemoglobin: 8.1 g/dL — ABNORMAL LOW (ref 13.0–17.0)
MCH: 28.2 pg (ref 26.0–34.0)
MCHC: 31.2 g/dL (ref 30.0–36.0)
MCV: 90.6 fL (ref 80.0–100.0)
Platelets: 635 10*3/uL — ABNORMAL HIGH (ref 150–400)
RBC: 2.87 MIL/uL — ABNORMAL LOW (ref 4.22–5.81)
RDW: 14.2 % (ref 11.5–15.5)
WBC: 13 10*3/uL — ABNORMAL HIGH (ref 4.0–10.5)
nRBC: 0 % (ref 0.0–0.2)

## 2019-04-05 ENCOUNTER — Other Ambulatory Visit (HOSPITAL_COMMUNITY): Payer: Medicare Other

## 2019-04-05 DIAGNOSIS — J9621 Acute and chronic respiratory failure with hypoxia: Secondary | ICD-10-CM | POA: Diagnosis not present

## 2019-04-05 DIAGNOSIS — J90 Pleural effusion, not elsewhere classified: Secondary | ICD-10-CM

## 2019-04-05 DIAGNOSIS — I5032 Chronic diastolic (congestive) heart failure: Secondary | ICD-10-CM | POA: Diagnosis not present

## 2019-04-05 DIAGNOSIS — J181 Lobar pneumonia, unspecified organism: Secondary | ICD-10-CM | POA: Diagnosis not present

## 2019-04-05 DIAGNOSIS — I482 Chronic atrial fibrillation, unspecified: Secondary | ICD-10-CM | POA: Diagnosis not present

## 2019-04-05 DIAGNOSIS — A419 Sepsis, unspecified organism: Secondary | ICD-10-CM

## 2019-04-05 DIAGNOSIS — R6520 Severe sepsis without septic shock: Secondary | ICD-10-CM

## 2019-04-05 LAB — URINE CULTURE: Culture: >=100000 — AB

## 2019-04-05 LAB — BLOOD GAS, ARTERIAL
Acid-Base Excess: 4.8 mmol/L — ABNORMAL HIGH (ref 0.0–2.0)
Bicarbonate: 28.4 mmol/L — ABNORMAL HIGH (ref 20.0–28.0)
O2 Content: 15 L/min
O2 Saturation: 99 %
Patient temperature: 98.6
pCO2 arterial: 38.6 mmHg (ref 32.0–48.0)
pH, Arterial: 7.479 — ABNORMAL HIGH (ref 7.350–7.450)
pO2, Arterial: 128 mmHg — ABNORMAL HIGH (ref 83.0–108.0)

## 2019-04-05 LAB — COMPREHENSIVE METABOLIC PANEL
ALT: 20 U/L (ref 0–44)
AST: 21 U/L (ref 15–41)
Albumin: 1.8 g/dL — ABNORMAL LOW (ref 3.5–5.0)
Alkaline Phosphatase: 53 U/L (ref 38–126)
Anion gap: 9 (ref 5–15)
BUN: 21 mg/dL (ref 8–23)
CO2: 29 mmol/L (ref 22–32)
Calcium: 7.9 mg/dL — ABNORMAL LOW (ref 8.9–10.3)
Chloride: 100 mmol/L (ref 98–111)
Creatinine, Ser: 1.42 mg/dL — ABNORMAL HIGH (ref 0.61–1.24)
GFR calc Af Amer: 49 mL/min — ABNORMAL LOW (ref >60)
GFR calc non Af Amer: 42 mL/min — ABNORMAL LOW (ref >60)
Glucose, Bld: 111 mg/dL — ABNORMAL HIGH (ref 70–99)
Potassium: 3.8 mmol/L (ref 3.5–5.1)
Sodium: 138 mmol/L (ref 135–145)
Total Bilirubin: 0.6 mg/dL (ref 0.3–1.2)
Total Protein: 6 g/dL — ABNORMAL LOW (ref 6.5–8.1)

## 2019-04-05 LAB — CBC
HCT: 25.7 % — ABNORMAL LOW (ref 39.0–52.0)
Hemoglobin: 8.1 g/dL — ABNORMAL LOW (ref 13.0–17.0)
MCH: 28.3 pg (ref 26.0–34.0)
MCHC: 31.5 g/dL (ref 30.0–36.0)
MCV: 89.9 fL (ref 80.0–100.0)
Platelets: 624 10*3/uL — ABNORMAL HIGH (ref 150–400)
RBC: 2.86 MIL/uL — ABNORMAL LOW (ref 4.22–5.81)
RDW: 14 % (ref 11.5–15.5)
WBC: 14.8 10*3/uL — ABNORMAL HIGH (ref 4.0–10.5)
nRBC: 0 % (ref 0.0–0.2)

## 2019-04-05 NOTE — Consult Note (Signed)
[facility] [place]  PULMONARY SERVICE  Date of Service: 04/05/2019  PULMONARY CRITICAL CARE CONSULT   Andre [name]  [id number]  DOB: [birth year]   DOA: 04/03/2019  Referring Physician: Merton Border, MD  HPI: Andre [name] is a 85 y.o. male seen for follow up of Acute on Chronic Respiratory Failure.  Patient has multiple medical problems including colon polyps atrial fibrillation diverticulosis hyperlipidemia hypertension nephrolithiasis presented to the hospital because of increasing shortness of breath.  Patient also has a history of chronic diastolic heart failure with complete heart block requiring a pacemaker placement.  Came in with acute respiratory distress and patient was intubated on sedation.  Initially on arrival he was saturating about 50% and came up to 70% with CPAP however then as noted became intubated.  Other issues included development of ARDS bilateral pneumonia sepsis with shock.  All involved the patient was treated acutely transferred to our facility for further evaluation and management.  Review of Systems:  ROS performed and is unremarkable other than noted above.  Past Medical History:  Diagnosis Date  . Adenomatous colon polyp 04/1985   no further colonoscopy, 2008-last colonoscopy, no polyps    . Atrial fibrillation ([place])   . Diverticulosis   . History of skin cancer    dermatology every 6 months Dr. [name]  . Hyperlipidemia   . Hypertension   . Macular degeneration    bilateral  . Nephrolithiasis   . PAF (paroxysmal atrial fibrillation) ([place])   . PAT (paroxysmal atrial tachycardia) (HCC)    many years ago, worse with smoking    Past Surgical History:  Procedure Laterality Date  . CARDIOVERSION N/A 05/23/2016   Procedure: CARDIOVERSION;  Surgeon: [name], MD;  Location: [facility] ENDOSCOPY;  Service: Cardiovascular;  Laterality: N/A;  . CARDIOVERSION N/A 03/01/2019   Procedure: CARDIOVERSION;   Surgeon: Lelon Perla, MD;  Location: [facility] ENDOSCOPY;  Service: Cardiovascular;  Laterality: N/A;  . CATARACT EXTRACTION     bilateral  . INGUINAL HERNIA REPAIR    . PACEMAKER IMPLANT N/A 02/24/2019   Procedure: PACEMAKER IMPLANT;  Surgeon: Deboraha Sprang, MD;  Location: [facility] CV LAB;  Service: Cardiovascular;  Laterality: N/A;  . TEE WITHOUT CARDIOVERSION N/A 03/22/2016   Procedure: TRANSESOPHAGEAL ECHOCARDIOGRAM (TEE);  Surgeon: [name], MD;  Location: [facility];  Service: Cardiovascular;  Laterality: N/A;  . TEE WITHOUT CARDIOVERSION N/A 05/23/2016   Procedure: TRANSESOPHAGEAL ECHOCARDIOGRAM (TEE);  Surgeon: [name], MD;  Location: [facility] ENDOSCOPY;  Service: Cardiovascular;  Laterality: N/A;  . TEE WITHOUT CARDIOVERSION N/A 03/01/2019   Procedure: TRANSESOPHAGEAL ECHOCARDIOGRAM (TEE);  Surgeon: Lelon Perla, MD;  Location: [facility] ENDOSCOPY;  Service: Cardiovascular;  Laterality: N/A;  . TENDON REPAIR  12/11   right leg    Social History:    reports that he quit smoking about 15 years ago. His smoking use included cigarettes. He has a 60.00 pack-year smoking history. He has never used smokeless tobacco. He reports current alcohol use. He reports that he does not use drugs.  Family History: Non-Contributory to the present illness  No Known Allergies  Medications: Reviewed on Rounds  Physical Exam:  Vitals: Temperature 100.9 pulse 86 respiratory 22 blood pressure 110/63 saturations 98%  Ventilator Settings patient was off the ventilator on high flow 8 L Oxymizer was started  . General: Comfortable at this time . Eyes: Grossly normal lids, irises & conjunctiva . ENT: grossly tongue is normal . Neck: no obvious  mass . Cardiovascular: S1-S2 normal no gallop or rub is noted . Respiratory: No rhonchi or rales at this time . Abdomen: Soft nontender . Skin: no rash seen on limited exam . Musculoskeletal: not rigid . Psychiatric:unable to  assess . Neurologic: no seizure no involuntary movements         Labs on Admission:  Basic Metabolic Panel: Recent Labs  Lab 04/04/19 0455 04/05/19 0429  NA 138 138  K 3.3* 3.8  CL 99 100  CO2 28 29  GLUCOSE 98 111*  BUN 26* 21  CREATININE 1.62* 1.42*  CALCIUM 7.9* 7.9*    No results for input(s): PHART, PCO2ART, PO2ART, HCO3, O2SAT in the last 168 hours.  Liver Function Tests: Recent Labs  Lab 04/04/19 0455 04/05/19 0429  AST 18 21  ALT 20 20  ALKPHOS 57 53  BILITOT 0.5 0.6  PROT 6.1* 6.0*  ALBUMIN 1.8* 1.8*   No results for input(s): LIPASE, AMYLASE in the last 168 hours. No results for input(s): AMMONIA in the last 168 hours.  CBC: Recent Labs  Lab 04/04/19 0455 04/05/19 0429  WBC 13.0* 14.8*  HGB 8.1* 8.1*  HCT 26.0* 25.7*  MCV 90.6 89.9  PLT 635* 624*    Cardiac Enzymes: No results for input(s): CKTOTAL, CKMB, CKMBINDEX, TROPONINI in the last 168 hours.  BNP (last 3 results) Recent Labs    02/23/19 0833  BNP 1,628.2*    ProBNP (last 3 results) No results for input(s): PROBNP in the last 8760 hours.   Radiological Exams on Admission: Dg Chest Port 1 View  Result Date: 04/05/2019 CLINICAL DATA:  85 year old male with respiratory failure. EXAM: PORTABLE CHEST 1 VIEW COMPARISON:  04/03/2019 and earlier. FINDINGS: Portable AP upright view at 0655 hours. Diffuse bilateral pulmonary interstitial opacity with mild regression in the upper lungs, and improved left apically ventilation when compared to prior studies. Continued somewhat confluent opacity at both lung bases. Stable cardiac size and mediastinal contours. Stable left chest cardiac pacemaker. No pneumothorax. Small bilateral pleural effusions were better demonstrated on the recent CT. Negative visible bowel gas pattern. IMPRESSION: 1. Only slight improvement in bilateral pulmonary interstitial opacity since 04/01/2019. The time course favors viral/atypical respiratory infection over persistent  pulmonary edema. 2. Small bilateral pleural effusions better demonstrated on the recent CT. Electronically Signed   By: Genevie Ann M.D.   On: 04/05/2019 07:15    Assessment/Plan Active Problems:   Acute on chronic respiratory failure with hypoxia (HCC)   Severe sepsis (HCC)   Chronic diastolic heart failure (HCC)   Lobar pneumonia, unspecified organism (HCC)   Chronic atrial fibrillation   1. Acute on chronic respiratory failure with hypoxia patient is off the ventilator he had initially been intubated now is on 8 L oxygen.  We will titrate oxygen as tolerated.  Patient also had possibility of pneumonia which was treated Zosyn followed by meropenem. 2. Sepsis with shock right now is hemodynamically stable patient has been treated with antibiotics total of 11 days. 3. Chronic diastolic heart failure patient has significant cardiac issues with a history of pacemaker placement.  Need to diuresis tolerated.  Last chest x-ray showed slight improvement from previous film compared on April 28. 4. Bilateral lobar pneumonia as noted above patient was treated with Zosyn followed by meropenem.  Patient is going to be continued with antibiotics. 5. Chronic atrial fibrillation flutter patient has pacemaker in place continue to monitor closely.  I have personally seen and evaluated the patient, evaluated laboratory and imaging  results, formulated the assessment and plan and placed orders. The Patient requires high complexity decision making for assessment and support.  Case was discussed on Rounds with the Respiratory Therapy Staff Time Spent 55minutes  Allyne Gee, MD [facility] Pulmonary Critical Care Medicine Sleep Medicine

## 2019-04-06 ENCOUNTER — Encounter: Payer: Self-pay | Admitting: Internal Medicine

## 2019-04-06 DIAGNOSIS — J9621 Acute and chronic respiratory failure with hypoxia: Secondary | ICD-10-CM | POA: Diagnosis present

## 2019-04-06 DIAGNOSIS — A419 Sepsis, unspecified organism: Secondary | ICD-10-CM | POA: Diagnosis present

## 2019-04-06 DIAGNOSIS — I5032 Chronic diastolic (congestive) heart failure: Secondary | ICD-10-CM | POA: Diagnosis present

## 2019-04-06 DIAGNOSIS — J181 Lobar pneumonia, unspecified organism: Secondary | ICD-10-CM | POA: Diagnosis present

## 2019-04-06 DIAGNOSIS — R6520 Severe sepsis without septic shock: Secondary | ICD-10-CM

## 2019-04-06 DIAGNOSIS — I482 Chronic atrial fibrillation, unspecified: Secondary | ICD-10-CM | POA: Diagnosis present

## 2019-04-07 DIAGNOSIS — J80 Acute respiratory distress syndrome: Secondary | ICD-10-CM

## 2019-04-07 DIAGNOSIS — I482 Chronic atrial fibrillation, unspecified: Secondary | ICD-10-CM | POA: Diagnosis not present

## 2019-04-07 DIAGNOSIS — J9621 Acute and chronic respiratory failure with hypoxia: Secondary | ICD-10-CM | POA: Diagnosis not present

## 2019-04-07 DIAGNOSIS — I5032 Chronic diastolic (congestive) heart failure: Secondary | ICD-10-CM | POA: Diagnosis not present

## 2019-04-07 NOTE — Progress Notes (Addendum)
Pulmonary Critical Care Medicine [place]   PULMONARY CRITICAL CARE SERVICE  PROGRESS NOTE  Date of Service: 04/07/2019  KHANH TANORI  [id number]  DOB: 04-30-1923   DOA: 04/03/2019  Referring Physician: Merton Border, MD  HPI: Andre [name] is a 85 y.o. male seen for follow up of Acute on Chronic Respiratory Failure.  Patient is currently on heated high flow nasal cannula 20 L and 45% FiO2.   Medications: Reviewed on Rounds  Physical Exam:  Vitals: Pulse 88 respirations 30 BP 154/70 O2 sat 95% temp 99.2  Ventilator Settings heated high flow 20 L and 45% FiO2  . General: Comfortable at this time . Eyes: Grossly normal lids, irises & conjunctiva . ENT: grossly tongue is normal . Neck: no obvious mass . Cardiovascular: S1 S2 normal no gallop . Respiratory: Coarse breath sounds . Abdomen: soft . Skin: no rash seen on limited exam . Musculoskeletal: not rigid . Psychiatric:unable to assess . Neurologic: no seizure no involuntary movements         Lab Data:   Basic Metabolic Panel: Recent Labs  Lab 04/04/19 0455 04/05/19 0429  NA 138 138  K 3.3* 3.8  CL 99 100  CO2 28 29  GLUCOSE 98 111*  BUN 26* 21  CREATININE 1.62* 1.42*  CALCIUM 7.9* 7.9*    ABG: Recent Labs  Lab 04/05/19 2332  PHART 7.479*  PCO2ART 38.6  PO2ART 128*  HCO3 28.4*  O2SAT 99.0    Liver Function Tests: Recent Labs  Lab 04/04/19 0455 04/05/19 0429  AST 18 21  ALT 20 20  ALKPHOS 57 53  BILITOT 0.5 0.6  PROT 6.1* 6.0*  ALBUMIN 1.8* 1.8*   No results for input(s): LIPASE, AMYLASE in the last 168 hours. No results for input(s): AMMONIA in the last 168 hours.  CBC: Recent Labs  Lab 04/04/19 0455 04/05/19 0429  WBC 13.0* 14.8*  HGB 8.1* 8.1*  HCT 26.0* 25.7*  MCV 90.6 89.9  PLT 635* 624*    Cardiac Enzymes: No results for input(s): CKTOTAL, CKMB, CKMBINDEX, TROPONINI in the last 168 hours.  BNP (last 3 results) Recent Labs     02/23/19 0833  BNP 1,628.2*    ProBNP (last 3 results) No results for input(s): PROBNP in the last 8760 hours.  Radiological Exams: No results found.  Assessment/Plan Active Problems:   Acute on chronic respiratory failure with hypoxia (HCC)   Severe sepsis (HCC)   Chronic diastolic heart failure (HCC)   Lobar pneumonia, unspecified organism (HCC)   Chronic atrial fibrillation   1. Acute on chronic respiratory failure with hypoxia we will continue to titrate oxygen as tolerated.  Continue pulmonary toilet and secretion management 2. Sepsis with shock hemodynamically stable continue to monitor 3. Chronic diastolic heart failure continue present management.  Continue to monitor for need for diuresis 4. Bilateral lobar pneumonia continue antibiotics. 5. Chronic atrial fibrillation flutter pacemaker in place continue to monitor   I have personally seen and evaluated the patient, evaluated laboratory and imaging results, formulated the assessment and plan and placed orders. The Patient requires high complexity decision making for assessment and support.  Case was discussed on Rounds with the Respiratory Therapy Staff  Allyne Gee, MD [facility] Pulmonary Critical Care Medicine Sleep Medicine

## 2019-04-08 ENCOUNTER — Other Ambulatory Visit (HOSPITAL_COMMUNITY): Payer: Medicare Other

## 2019-04-08 DIAGNOSIS — J9621 Acute and chronic respiratory failure with hypoxia: Secondary | ICD-10-CM | POA: Diagnosis not present

## 2019-04-08 DIAGNOSIS — I5032 Chronic diastolic (congestive) heart failure: Secondary | ICD-10-CM | POA: Diagnosis not present

## 2019-04-08 DIAGNOSIS — J181 Lobar pneumonia, unspecified organism: Secondary | ICD-10-CM | POA: Diagnosis not present

## 2019-04-08 DIAGNOSIS — I482 Chronic atrial fibrillation, unspecified: Secondary | ICD-10-CM | POA: Diagnosis not present

## 2019-04-08 LAB — CBC
HCT: 24.2 % — ABNORMAL LOW (ref 39.0–52.0)
Hemoglobin: 7.6 g/dL — ABNORMAL LOW (ref 13.0–17.0)
MCH: 28.1 pg (ref 26.0–34.0)
MCHC: 31.4 g/dL (ref 30.0–36.0)
MCV: 89.6 fL (ref 80.0–100.0)
Platelets: 553 10*3/uL — ABNORMAL HIGH (ref 150–400)
RBC: 2.7 MIL/uL — ABNORMAL LOW (ref 4.22–5.81)
RDW: 14.1 % (ref 11.5–15.5)
WBC: 12.8 10*3/uL — ABNORMAL HIGH (ref 4.0–10.5)
nRBC: 0 % (ref 0.0–0.2)

## 2019-04-08 LAB — BASIC METABOLIC PANEL
Anion gap: 9 (ref 5–15)
BUN: 22 mg/dL (ref 8–23)
CO2: 30 mmol/L (ref 22–32)
Calcium: 7.9 mg/dL — ABNORMAL LOW (ref 8.9–10.3)
Chloride: 99 mmol/L (ref 98–111)
Creatinine, Ser: 1.46 mg/dL — ABNORMAL HIGH (ref 0.61–1.24)
GFR calc Af Amer: 47 mL/min — ABNORMAL LOW (ref >60)
GFR calc non Af Amer: 41 mL/min — ABNORMAL LOW (ref >60)
Glucose, Bld: 111 mg/dL — ABNORMAL HIGH (ref 70–99)
Potassium: 4.1 mmol/L (ref 3.5–5.1)
Sodium: 138 mmol/L (ref 135–145)

## 2019-04-08 LAB — MAGNESIUM: Magnesium: 2.3 mg/dL (ref 1.7–2.4)

## 2019-04-08 NOTE — Progress Notes (Addendum)
Pulmonary Critical Care Medicine [place]   PULMONARY CRITICAL CARE SERVICE  PROGRESS NOTE  Date of Service: 04/08/2019  Andre [name]  [id number]  DOB: 1923-10-16   DOA: 04/03/2019  Referring Physician: Merton Border, MD  HPI: Andre [name] is a 85 y.o. male seen for follow up of Acute on Chronic Respiratory Failure.  Patient remains on heated high flow nasal cannula 20 L and 45% FiO2.  Resting comfortably with no distress.  Medications: Reviewed on Rounds  Physical Exam:  Vitals: Pulse 73 respirations 20 BP 138/60 O2 sat 94% temp 97.8  Ventilator Settings heated high flow 20 L 45% FiO2  . General: Comfortable at this time . Eyes: Grossly normal lids, irises & conjunctiva . ENT: grossly tongue is normal . Neck: no obvious mass . Cardiovascular: S1 S2 normal no gallop . Respiratory: Coarse breath sounds . Abdomen: soft . Skin: no rash seen on limited exam . Musculoskeletal: not rigid . Psychiatric:unable to assess . Neurologic: no seizure no involuntary movements         Lab Data:   Basic Metabolic Panel: Recent Labs  Lab 04/04/19 0455 04/05/19 0429 04/08/19 0715  NA 138 138 138  K 3.3* 3.8 4.1  CL 99 100 99  CO2 28 29 30   GLUCOSE 98 111* 111*  BUN 26* 21 22  CREATININE 1.62* 1.42* 1.46*  CALCIUM 7.9* 7.9* 7.9*  MG  --   --  2.3    ABG: Recent Labs  Lab 04/05/19 2332  PHART 7.479*  PCO2ART 38.6  PO2ART 128*  HCO3 28.4*  O2SAT 99.0    Liver Function Tests: Recent Labs  Lab 04/04/19 0455 04/05/19 0429  AST 18 21  ALT 20 20  ALKPHOS 57 53  BILITOT 0.5 0.6  PROT 6.1* 6.0*  ALBUMIN 1.8* 1.8*   No results for input(s): LIPASE, AMYLASE in the last 168 hours. No results for input(s): AMMONIA in the last 168 hours.  CBC: Recent Labs  Lab 04/04/19 0455 04/05/19 0429 04/08/19 0715  WBC 13.0* 14.8* 12.8*  HGB 8.1* 8.1* 7.6*  HCT 26.0* 25.7* 24.2*  MCV 90.6 89.9 89.6  PLT 635* 624* 553*    Cardiac  Enzymes: No results for input(s): CKTOTAL, CKMB, CKMBINDEX, TROPONINI in the last 168 hours.  BNP (last 3 results) Recent Labs    02/23/19 0833  BNP 1,628.2*    ProBNP (last 3 results) No results for input(s): PROBNP in the last 8760 hours.  Radiological Exams: Dg Chest Port 1 View  Result Date: 04/08/2019 CLINICAL DATA:  Pleural effusion. EXAM: PORTABLE CHEST 1 VIEW COMPARISON:  Radiograph of April 05, 2019. FINDINGS: Stable cardiomediastinal silhouette. Atherosclerosis thoracic aorta is noted. Left-sided pacemaker is unchanged in position. No pneumothorax is noted. Stable bilateral interstitial opacities are noted throughout both lungs, concerning for atypical infection or possibly edema. Minimal pleural effusions may be present. Bony thorax is unremarkable. IMPRESSION: Stable bilateral lung opacities as described above. Aortic Atherosclerosis (ICD10-I70.0). Electronically Signed   By: Marijo Conception M.D.   On: 04/08/2019 07:35    Assessment/Plan Active Problems:   Acute on chronic respiratory failure with hypoxia (HCC)   Severe sepsis (HCC)   Chronic diastolic heart failure (HCC)   Lobar pneumonia, unspecified organism (HCC)   Chronic atrial fibrillation   1. Acute on chronic respiratory failure with hypoxia continue titrate oxygen as tolerated.  Continue secretion management and pulmonary toilet as well as supportive measures. 2. Sepsis with shock hemodynamically stable continue to monitor  3. Chronic diastolic heart failure continue present management 4. Bilateral lobar pneumonia continue antibiotics 5. Chronic atrial fibrillation flutter pacemaker in place continue to monitor   I have personally seen and evaluated the patient, evaluated laboratory and imaging results, formulated the assessment and plan and placed orders. The Patient requires high complexity decision making for assessment and support.  Case was discussed on Rounds with the Respiratory Therapy Staff  Allyne Gee, MD [facility] Pulmonary Critical Care Medicine Sleep Medicine

## 2019-04-09 DIAGNOSIS — J9621 Acute and chronic respiratory failure with hypoxia: Secondary | ICD-10-CM | POA: Diagnosis not present

## 2019-04-09 DIAGNOSIS — I5032 Chronic diastolic (congestive) heart failure: Secondary | ICD-10-CM | POA: Diagnosis not present

## 2019-04-09 DIAGNOSIS — I482 Chronic atrial fibrillation, unspecified: Secondary | ICD-10-CM | POA: Diagnosis not present

## 2019-04-09 DIAGNOSIS — J181 Lobar pneumonia, unspecified organism: Secondary | ICD-10-CM | POA: Diagnosis not present

## 2019-04-09 NOTE — Progress Notes (Addendum)
Pulmonary Critical Care Medicine [place]   PULMONARY CRITICAL CARE SERVICE  PROGRESS NOTE  Date of Service: 04/09/2019  ANIKEN MONESTIME  [id number]  DOB: 1923-04-02   DOA: 04/03/2019  Referring Physician: Merton Border, MD  HPI: Andre [name] is a 85 y.o. male seen for follow up of Acute on Chronic Respiratory Failure.  Patient remains on heated high flow nasal cannula 20 L and 55% FiO2.  No fever or distress noted at this time.  Medications: Reviewed on Rounds  Physical Exam:  Vitals: Pulse 87 respiration 29 BP 122/64 O2 sat 97% 98.6  Ventilator Settings heated high flow 20 L 55% FiO2  . General: Comfortable at this time . Eyes: Grossly normal lids, irises & conjunctiva . ENT: grossly tongue is normal . Neck: no obvious mass . Cardiovascular: S1 S2 normal no gallop . Respiratory: Coarse breath sounds . Abdomen: soft . Skin: no rash seen on limited exam . Musculoskeletal: not rigid . Psychiatric:unable to assess . Neurologic: no seizure no involuntary movements         Lab Data:   Basic Metabolic Panel: Recent Labs  Lab 04/04/19 0455 04/05/19 0429 04/08/19 0715  NA 138 138 138  K 3.3* 3.8 4.1  CL 99 100 99  CO2 28 29 30   GLUCOSE 98 111* 111*  BUN 26* 21 22  CREATININE 1.62* 1.42* 1.46*  CALCIUM 7.9* 7.9* 7.9*  MG  --   --  2.3    ABG: Recent Labs  Lab 04/05/19 2332  PHART 7.479*  PCO2ART 38.6  PO2ART 128*  HCO3 28.4*  O2SAT 99.0    Liver Function Tests: Recent Labs  Lab 04/04/19 0455 04/05/19 0429  AST 18 21  ALT 20 20  ALKPHOS 57 53  BILITOT 0.5 0.6  PROT 6.1* 6.0*  ALBUMIN 1.8* 1.8*   No results for input(s): LIPASE, AMYLASE in the last 168 hours. No results for input(s): AMMONIA in the last 168 hours.  CBC: Recent Labs  Lab 04/04/19 0455 04/05/19 0429 04/08/19 0715  WBC 13.0* 14.8* 12.8*  HGB 8.1* 8.1* 7.6*  HCT 26.0* 25.7* 24.2*  MCV 90.6 89.9 89.6  PLT 635* 624* 553*    Cardiac  Enzymes: No results for input(s): CKTOTAL, CKMB, CKMBINDEX, TROPONINI in the last 168 hours.  BNP (last 3 results) Recent Labs    02/23/19 0833  BNP 1,628.2*    ProBNP (last 3 results) No results for input(s): PROBNP in the last 8760 hours.  Radiological Exams: Dg Chest Port 1 View  Result Date: 04/08/2019 CLINICAL DATA:  Pleural effusion. EXAM: PORTABLE CHEST 1 VIEW COMPARISON:  Radiograph of April 05, 2019. FINDINGS: Stable cardiomediastinal silhouette. Atherosclerosis thoracic aorta is noted. Left-sided pacemaker is unchanged in position. No pneumothorax is noted. Stable bilateral interstitial opacities are noted throughout both lungs, concerning for atypical infection or possibly edema. Minimal pleural effusions may be present. Bony thorax is unremarkable. IMPRESSION: Stable bilateral lung opacities as described above. Aortic Atherosclerosis (ICD10-I70.0). Electronically Signed   By: Marijo Conception M.D.   On: 04/08/2019 07:35    Assessment/Plan Active Problems:   Acute on chronic respiratory failure with hypoxia (HCC)   Severe sepsis (HCC)   Chronic diastolic heart failure (HCC)   Lobar pneumonia, unspecified organism (HCC)   Chronic atrial fibrillation   1. Acute on chronic respiratory failure with hypoxia continue titrate oxygen as tolerated.  Continue secretion management pulmonary toilet with supportive measures 2. Sepsis with shock hemodynamically stable continue to monitor 3.  Chronic diastolic heart failure continue present management 4. Bilateral lobar pneumonia continue antibiotics 5. Chronic atrial fibrillation/flutter pacemaker in place continue to monitor   I have personally seen and evaluated the patient, evaluated laboratory and imaging results, formulated the assessment and plan and placed orders. The Patient requires high complexity decision making for assessment and support.  Case was discussed on Rounds with the Respiratory Therapy Staff  Allyne Gee, MD  [facility] Pulmonary Critical Care Medicine Sleep Medicine

## 2019-04-10 ENCOUNTER — Other Ambulatory Visit (HOSPITAL_COMMUNITY): Payer: Medicare Other

## 2019-04-10 DIAGNOSIS — J181 Lobar pneumonia, unspecified organism: Secondary | ICD-10-CM | POA: Diagnosis not present

## 2019-04-10 DIAGNOSIS — I5032 Chronic diastolic (congestive) heart failure: Secondary | ICD-10-CM | POA: Diagnosis not present

## 2019-04-10 DIAGNOSIS — J9621 Acute and chronic respiratory failure with hypoxia: Secondary | ICD-10-CM | POA: Diagnosis not present

## 2019-04-10 DIAGNOSIS — I482 Chronic atrial fibrillation, unspecified: Secondary | ICD-10-CM | POA: Diagnosis not present

## 2019-04-10 LAB — CBC
HCT: 25.8 % — ABNORMAL LOW (ref 39.0–52.0)
Hemoglobin: 8.1 g/dL — ABNORMAL LOW (ref 13.0–17.0)
MCH: 28 pg (ref 26.0–34.0)
MCHC: 31.4 g/dL (ref 30.0–36.0)
MCV: 89.3 fL (ref 80.0–100.0)
Platelets: 549 10*3/uL — ABNORMAL HIGH (ref 150–400)
RBC: 2.89 MIL/uL — ABNORMAL LOW (ref 4.22–5.81)
RDW: 14.2 % (ref 11.5–15.5)
WBC: 13.5 10*3/uL — ABNORMAL HIGH (ref 4.0–10.5)
nRBC: 0 % (ref 0.0–0.2)

## 2019-04-10 LAB — RENAL FUNCTION PANEL
Albumin: 1.9 g/dL — ABNORMAL LOW (ref 3.5–5.0)
Anion gap: 8 (ref 5–15)
BUN: 24 mg/dL — ABNORMAL HIGH (ref 8–23)
CO2: 31 mmol/L (ref 22–32)
Calcium: 8.3 mg/dL — ABNORMAL LOW (ref 8.9–10.3)
Chloride: 100 mmol/L (ref 98–111)
Creatinine, Ser: 1.32 mg/dL — ABNORMAL HIGH (ref 0.61–1.24)
GFR calc Af Amer: 54 mL/min — ABNORMAL LOW (ref >60)
GFR calc non Af Amer: 46 mL/min — ABNORMAL LOW (ref >60)
Glucose, Bld: 101 mg/dL — ABNORMAL HIGH (ref 70–99)
Phosphorus: 3.2 mg/dL (ref 2.5–4.6)
Potassium: 3.8 mmol/L (ref 3.5–5.1)
Sodium: 139 mmol/L (ref 135–145)

## 2019-04-10 LAB — MAGNESIUM: Magnesium: 2.1 mg/dL (ref 1.7–2.4)

## 2019-04-10 NOTE — Progress Notes (Signed)
Pulmonary Critical Care Medicine Andre [name]   PULMONARY CRITICAL CARE SERVICE  PROGRESS NOTE  Date of Service: 04/10/2019  Andre [name]  [id number]  DOB: [birth year]   DOA: 04/03/2019  Referring Physician: Merton Border, MD  HPI: Andre [name] is a 85 y.o. male seen for follow up of Acute on Chronic Respiratory Failure.  Patient is on high flow nasal cannula has been on 45% FiO2 20 L  Medications: Reviewed on Rounds  Physical Exam:  Vitals: Temperature 98.5 pulse 84 respiratory rate 16 blood pressure 122/63 saturations 94%  Ventilator Settings on heated high flow 45% FiO2 with 20 L/min  . General: Comfortable at this time . Eyes: Grossly normal lids, irises & conjunctiva . ENT: grossly tongue is normal . Neck: no obvious mass . Cardiovascular: S1 S2 normal no gallop . Respiratory: Scattered rhonchi expansion is equal . Abdomen: soft . Skin: no rash seen on limited exam . Musculoskeletal: not rigid . Psychiatric:unable to assess . Neurologic: no seizure no involuntary movements         Lab Data:   Basic Metabolic Panel: Recent Labs  Lab 04/04/19 0455 04/05/19 0429 04/08/19 0715 04/10/19 0437  NA 138 138 138 139  K 3.3* 3.8 4.1 3.8  CL 99 100 99 100  CO2 28 29 30 31   GLUCOSE 98 111* 111* 101*  BUN 26* 21 22 24*  CREATININE 1.62* 1.42* 1.46* 1.32*  CALCIUM 7.9* 7.9* 7.9* 8.3*  MG  --   --  2.3 2.1  PHOS  --   --   --  3.2    ABG: Recent Labs  Lab 04/05/19 2332  PHART 7.479*  PCO2ART 38.6  PO2ART 128*  HCO3 28.4*  O2SAT 99.0    Liver Function Tests: Recent Labs  Lab 04/04/19 0455 04/05/19 0429 04/10/19 0437  AST 18 21  --   ALT 20 20  --   ALKPHOS 57 53  --   BILITOT 0.5 0.6  --   PROT 6.1* 6.0*  --   ALBUMIN 1.8* 1.8* 1.9*   No results for input(s): LIPASE, AMYLASE in the last 168 hours. No results for input(s): AMMONIA in the last 168 hours.  CBC: Recent Labs  Lab 04/04/19 0455 04/05/19 0429  04/08/19 0715 04/10/19 0437  WBC 13.0* 14.8* 12.8* 13.5*  HGB 8.1* 8.1* 7.6* 8.1*  HCT 26.0* 25.7* 24.2* 25.8*  MCV 90.6 89.9 89.6 89.3  PLT 635* 624* 553* 549*    Cardiac Enzymes: No results for input(s): CKTOTAL, CKMB, CKMBINDEX, TROPONINI in the last 168 hours.  BNP (last 3 results) Recent Labs    02/23/19 0833  BNP 1,628.2*    ProBNP (last 3 results) No results for input(s): PROBNP in the last 8760 hours.  Radiological Exams: Dg Chest Port 1 View  Result Date: 04/10/2019 CLINICAL DATA:  Pneumonia, acute respiratory distress syndrome. EXAM: PORTABLE CHEST 1 VIEW COMPARISON:  Radiograph of April 08, 2019. FINDINGS: Stable cardiomediastinal silhouette. Atherosclerosis of thoracic aorta is noted. Left-sided pacemaker is unchanged. No pneumothorax is noted. Stable bilateral lung opacities are noted concerning for atypical infection or possibly edema. Small bilateral pleural effusions are noted. Bony thorax is unremarkable. IMPRESSION: Stable bilateral lung opacities concerning for atypical infection or edema. Small bilateral pleural effusions. Aortic Atherosclerosis (ICD10-I70.0). Electronically Signed   By: Marijo Conception M.D.   On: 04/10/2019 07:07    Assessment/Plan Active Problems:   Acute on chronic respiratory failure with hypoxia (HCC)   Severe sepsis ([place])  Chronic diastolic heart failure (HCC)   Lobar pneumonia, unspecified organism (HCC)   Chronic atrial fibrillation   1. Acute on chronic respiratory failure hypoxia patient remains on heated high flow the last chest x-ray had shown some atypical edema 2. Severe sepsis resolved hemodynamically stable 3. Chronic diastolic heart failure at baseline 4. Lobar pneumonia follow-up chest x-ray as noted to show some atypical edema versus infection we will discuss with primary care team 5. Chronic atrial fibrillation rate is controlled at this time   I have personally seen and evaluated the patient, evaluated laboratory  and imaging results, formulated the assessment and plan and placed orders. The Patient requires high complexity decision making for assessment and support.  Case was discussed on Rounds with the Respiratory Therapy Staff  Allyne Gee, MD [facility] Pulmonary Critical Care Medicine Sleep Medicine

## 2019-04-11 DIAGNOSIS — I5032 Chronic diastolic (congestive) heart failure: Secondary | ICD-10-CM | POA: Diagnosis not present

## 2019-04-11 DIAGNOSIS — J9621 Acute and chronic respiratory failure with hypoxia: Secondary | ICD-10-CM | POA: Diagnosis not present

## 2019-04-11 DIAGNOSIS — J181 Lobar pneumonia, unspecified organism: Secondary | ICD-10-CM | POA: Diagnosis not present

## 2019-04-11 DIAGNOSIS — I482 Chronic atrial fibrillation, unspecified: Secondary | ICD-10-CM | POA: Diagnosis not present

## 2019-04-11 NOTE — Progress Notes (Addendum)
Pulmonary Critical Care Medicine [place]   PULMONARY CRITICAL CARE SERVICE  PROGRESS NOTE  Date of Service: 04/11/2019  Andre [name]  [id number]  DOB: 04-Mar-1923   DOA: 04/03/2019  Referring Physician: Merton Border, MD  HPI: Andre [name] is a 85 y.o. male seen for follow up of Acute on Chronic Respiratory Failure.  Patient had been on heated high flow 20 L and 45% FiO2.  Unable to wean at this time.  Currently good saturations with no distress.  Medications: Reviewed on Rounds  Physical Exam:  Vitals: Pulse 87 respirations 2 BP 127/62 O2 sat 98% temp 98.6  Ventilator Settings heated high flow 20 L and 45% FiO2  . General: Comfortable at this time . Eyes: Grossly normal lids, irises & conjunctiva . ENT: grossly tongue is normal . Neck: no obvious mass . Cardiovascular: S1 S2 normal no gallop . Respiratory: Coarse breath sounds . Abdomen: soft . Skin: no rash seen on limited exam . Musculoskeletal: not rigid . Psychiatric:unable to assess . Neurologic: no seizure no involuntary movements         Lab Data:   Basic Metabolic Panel: Recent Labs  Lab 04/05/19 0429 04/08/19 0715 04/10/19 0437  NA 138 138 139  K 3.8 4.1 3.8  CL 100 99 100  CO2 29 30 31   GLUCOSE 111* 111* 101*  BUN 21 22 24*  CREATININE 1.42* 1.46* 1.32*  CALCIUM 7.9* 7.9* 8.3*  MG  --  2.3 2.1  PHOS  --   --  3.2    ABG: Recent Labs  Lab 04/05/19 2332  PHART 7.479*  PCO2ART 38.6  PO2ART 128*  HCO3 28.4*  O2SAT 99.0    Liver Function Tests: Recent Labs  Lab 04/05/19 0429 04/10/19 0437  AST 21  --   ALT 20  --   ALKPHOS 53  --   BILITOT 0.6  --   PROT 6.0*  --   ALBUMIN 1.8* 1.9*   No results for input(s): LIPASE, AMYLASE in the last 168 hours. No results for input(s): AMMONIA in the last 168 hours.  CBC: Recent Labs  Lab 04/05/19 0429 04/08/19 0715 04/10/19 0437  WBC 14.8* 12.8* 13.5*  HGB 8.1* 7.6* 8.1*  HCT 25.7* 24.2* 25.8*  MCV  89.9 89.6 89.3  PLT 624* 553* 549*    Cardiac Enzymes: No results for input(s): CKTOTAL, CKMB, CKMBINDEX, TROPONINI in the last 168 hours.  BNP (last 3 results) Recent Labs    02/23/19 0833  BNP 1,628.2*    ProBNP (last 3 results) No results for input(s): PROBNP in the last 8760 hours.  Radiological Exams: Dg Chest Port 1 View  Result Date: 04/10/2019 CLINICAL DATA:  Pneumonia, acute respiratory distress syndrome. EXAM: PORTABLE CHEST 1 VIEW COMPARISON:  Radiograph of April 08, 2019. FINDINGS: Stable cardiomediastinal silhouette. Atherosclerosis of thoracic aorta is noted. Left-sided pacemaker is unchanged. No pneumothorax is noted. Stable bilateral lung opacities are noted concerning for atypical infection or possibly edema. Small bilateral pleural effusions are noted. Bony thorax is unremarkable. IMPRESSION: Stable bilateral lung opacities concerning for atypical infection or edema. Small bilateral pleural effusions. Aortic Atherosclerosis (ICD10-I70.0). Electronically Signed   By: Marijo Conception M.D.   On: 04/10/2019 07:07    Assessment/Plan Active Problems:   Acute on chronic respiratory failure with hypoxia (HCC)   Severe sepsis (HCC)   Chronic diastolic heart failure (HCC)   Lobar pneumonia, unspecified organism (HCC)   Chronic atrial fibrillation   1. Acute on  chronic respiratory failure with hypoxia patient remains on heated high flow at this time.  Continue to titrate as tolerated.  Continue [place] toilet and secretion management 2. Severe sepsis resolved hemodynamically stable 3. Chronic diastolic heart failure at baseline 4. Low ammonia follow up with chest x-ray 5. Current atrial fibrillation rate controlled   I have personally seen and evaluated the patient, evaluated laboratory and imaging results, formulated the assessment and plan and placed orders. The Patient requires high complexity decision making for assessment and support.  Case was discussed on Rounds  with the Respiratory Therapy Staff  Allyne Gee, MD [facility] Pulmonary Critical Care Medicine Sleep Medicine

## 2019-04-12 ENCOUNTER — Other Ambulatory Visit (HOSPITAL_COMMUNITY): Payer: Medicare Other

## 2019-04-12 DIAGNOSIS — J9621 Acute and chronic respiratory failure with hypoxia: Secondary | ICD-10-CM | POA: Diagnosis not present

## 2019-04-12 DIAGNOSIS — I482 Chronic atrial fibrillation, unspecified: Secondary | ICD-10-CM | POA: Diagnosis not present

## 2019-04-12 DIAGNOSIS — I5032 Chronic diastolic (congestive) heart failure: Secondary | ICD-10-CM | POA: Diagnosis not present

## 2019-04-12 DIAGNOSIS — J181 Lobar pneumonia, unspecified organism: Secondary | ICD-10-CM | POA: Diagnosis not present

## 2019-04-12 NOTE — Progress Notes (Addendum)
Pulmonary Critical Care Medicine [place]   PULMONARY CRITICAL CARE SERVICE  PROGRESS NOTE  Date of Service: 04/12/2019  Andre [name]  [id number]  DOB: 03/28/1923   DOA: 04/03/2019  Referring Physician: Merton Border, MD  HPI: Andre [name] is a 85 y.o. male seen for follow up of Acute on Chronic Respiratory Failure.  Patient is currently on 4 L of oxygen via Oxymizer.  Satting in the low 90s currently with no distress noted.  Medications: Reviewed on Rounds  Physical Exam:  Vitals: Pulse 86 respirations 16 BP 97/63 O2 sat 92% temp 98.9  Ventilator Settings 4 L Oxymizer  . General: Comfortable at this time . Eyes: Grossly normal lids, irises & conjunctiva . ENT: grossly tongue is normal . Neck: no obvious mass . Cardiovascular: S1 S2 normal no gallop . Respiratory: Coarse breath sounds . Abdomen: soft . Skin: no rash seen on limited exam . Musculoskeletal: not rigid . Psychiatric:unable to assess . Neurologic: no seizure no involuntary movements         Lab Data:   Basic Metabolic Panel: Recent Labs  Lab 04/08/19 0715 04/10/19 0437  NA 138 139  K 4.1 3.8  CL 99 100  CO2 30 31  GLUCOSE 111* 101*  BUN 22 24*  CREATININE 1.46* 1.32*  CALCIUM 7.9* 8.3*  MG 2.3 2.1  PHOS  --  3.2    ABG: Recent Labs  Lab 04/05/19 2332  PHART 7.479*  PCO2ART 38.6  PO2ART 128*  HCO3 28.4*  O2SAT 99.0    Liver Function Tests: Recent Labs  Lab 04/10/19 0437  ALBUMIN 1.9*   No results for input(s): LIPASE, AMYLASE in the last 168 hours. No results for input(s): AMMONIA in the last 168 hours.  CBC: Recent Labs  Lab 04/08/19 0715 04/10/19 0437  WBC 12.8* 13.5*  HGB 7.6* 8.1*  HCT 24.2* 25.8*  MCV 89.6 89.3  PLT 553* 549*    Cardiac Enzymes: No results for input(s): CKTOTAL, CKMB, CKMBINDEX, TROPONINI in the last 168 hours.  BNP (last 3 results) Recent Labs    02/23/19 0833  BNP 1,628.2*    ProBNP (last 3 results) No  results for input(s): PROBNP in the last 8760 hours.  Radiological Exams: No results found.  Assessment/Plan Active Problems:   Acute on chronic respiratory failure with hypoxia (HCC)   Severe sepsis (HCC)   Chronic diastolic heart failure (HCC)   Lobar pneumonia, unspecified organism (HCC)   Chronic atrial fibrillation   1. Acute on chronic story failure with hypoxia patient currently on 4 L of oxygen via Oxymizer.  Tolerating well.  Continue pulmonary toilet and secretion management 2. Severe sepsis resolved hemodynamically stable 3. Chronic diastolic heart failure at baseline 4. Low ammonia follow-up with chest x-ray 5. Chronic atrial fibrillation rate controlled   I have personally seen and evaluated the patient, evaluated laboratory and imaging results, formulated the assessment and plan and placed orders. The Patient requires high complexity decision making for assessment and support.  Case was discussed on Rounds with the Respiratory Therapy Staff  Allyne Gee, MD [facility] Pulmonary Critical Care Medicine Sleep Medicine

## 2019-04-13 ENCOUNTER — Other Ambulatory Visit (HOSPITAL_COMMUNITY): Payer: Medicare Other

## 2019-04-13 DIAGNOSIS — J181 Lobar pneumonia, unspecified organism: Secondary | ICD-10-CM | POA: Diagnosis not present

## 2019-04-13 DIAGNOSIS — I482 Chronic atrial fibrillation, unspecified: Secondary | ICD-10-CM | POA: Diagnosis not present

## 2019-04-13 DIAGNOSIS — J9621 Acute and chronic respiratory failure with hypoxia: Secondary | ICD-10-CM | POA: Diagnosis not present

## 2019-04-13 DIAGNOSIS — I5032 Chronic diastolic (congestive) heart failure: Secondary | ICD-10-CM | POA: Diagnosis not present

## 2019-04-13 LAB — RENAL FUNCTION PANEL
Albumin: 1.8 g/dL — ABNORMAL LOW (ref 3.5–5.0)
Anion gap: 12 (ref 5–15)
BUN: 24 mg/dL — ABNORMAL HIGH (ref 8–23)
CO2: 28 mmol/L (ref 22–32)
Calcium: 8.1 mg/dL — ABNORMAL LOW (ref 8.9–10.3)
Chloride: 100 mmol/L (ref 98–111)
Creatinine, Ser: 1.19 mg/dL (ref 0.61–1.24)
GFR calc Af Amer: >60 mL/min (ref >60)
GFR calc non Af Amer: 52 mL/min — ABNORMAL LOW (ref >60)
Glucose, Bld: 101 mg/dL — ABNORMAL HIGH (ref 70–99)
Phosphorus: 3.9 mg/dL (ref 2.5–4.6)
Potassium: 3.3 mmol/L — ABNORMAL LOW (ref 3.5–5.1)
Sodium: 140 mmol/L (ref 135–145)

## 2019-04-13 LAB — CBC
HCT: 26.6 % — ABNORMAL LOW (ref 39.0–52.0)
Hemoglobin: 8.2 g/dL — ABNORMAL LOW (ref 13.0–17.0)
MCH: 27.5 pg (ref 26.0–34.0)
MCHC: 30.8 g/dL (ref 30.0–36.0)
MCV: 89.3 fL (ref 80.0–100.0)
Platelets: 448 10*3/uL — ABNORMAL HIGH (ref 150–400)
RBC: 2.98 MIL/uL — ABNORMAL LOW (ref 4.22–5.81)
RDW: 14.6 % (ref 11.5–15.5)
WBC: 11.8 10*3/uL — ABNORMAL HIGH (ref 4.0–10.5)
nRBC: 0 % (ref 0.0–0.2)

## 2019-04-13 LAB — MAGNESIUM: Magnesium: 1.9 mg/dL (ref 1.7–2.4)

## 2019-04-13 NOTE — Progress Notes (Addendum)
Pulmonary Critical Care Medicine [place]   PULMONARY CRITICAL CARE SERVICE  PROGRESS NOTE  Date of Service: 04/13/2019  Andre [name]  [id number]  DOB: June 09, 1923   DOA: 04/03/2019  Referring Physician: Merton Border, MD  HPI: Andre [name] is a 85 y.o. male seen for follow up of Acute on Chronic Respiratory Failure.  Patient on 3 L of Oxymizer overnight however today is on 4 L of oxygen via nasal cannula with no distress at this time.  Medications: Reviewed on Rounds  Physical Exam:  Vitals: Pulse 84 respirations 22 BP 127/60 O2 sat 98% temp 98.7  Ventilator Settings 4 L nasal cannula  . General: Comfortable at this time . Eyes: Grossly normal lids, irises & conjunctiva . ENT: grossly tongue is normal . Neck: no obvious mass . Cardiovascular: S1 S2 normal no gallop . Respiratory: No rales or rhonchi noted . Abdomen: soft . Skin: no rash seen on limited exam . Musculoskeletal: not rigid . Psychiatric:unable to assess . Neurologic: no seizure no involuntary movements         Lab Data:   Basic Metabolic Panel: Recent Labs  Lab 04/08/19 0715 04/10/19 0437 04/13/19 0311  NA 138 139 140  K 4.1 3.8 3.3*  CL 99 100 100  CO2 30 31 28   GLUCOSE 111* 101* 101*  BUN 22 24* 24*  CREATININE 1.46* 1.32* 1.19  CALCIUM 7.9* 8.3* 8.1*  MG 2.3 2.1 1.9  PHOS  --  3.2 3.9    ABG: No results for input(s): PHART, PCO2ART, PO2ART, HCO3, O2SAT in the last 168 hours.  Liver Function Tests: Recent Labs  Lab 04/10/19 0437 04/13/19 0311  ALBUMIN 1.9* 1.8*   No results for input(s): LIPASE, AMYLASE in the last 168 hours. No results for input(s): AMMONIA in the last 168 hours.  CBC: Recent Labs  Lab 04/08/19 0715 04/10/19 0437 04/13/19 0311  WBC 12.8* 13.5* 11.8*  HGB 7.6* 8.1* 8.2*  HCT 24.2* 25.8* 26.6*  MCV 89.6 89.3 89.3  PLT 553* 549* 448*    Cardiac Enzymes: No results for input(s): CKTOTAL, CKMB, CKMBINDEX, TROPONINI in the  last 168 hours.  BNP (last 3 results) Recent Labs    02/23/19 0833  BNP 1,628.2*    ProBNP (last 3 results) No results for input(s): PROBNP in the last 8760 hours.  Radiological Exams: Dg Chest Port 1 View  Result Date: 04/13/2019 CLINICAL DATA:  Pneumonia EXAM: PORTABLE CHEST 1 VIEW COMPARISON:  04/10/2019 FINDINGS: Cardiomegaly. Aortic calcification and tortuosity. Generalized interstitial coarsening with similar pattern. Probable trace effusions. A focal emphysema by CT. No pneumothorax. Stable dual-chamber pacer leads from the left. IMPRESSION: History of pneumonia with stable bilateral opacity superimposed on emphysema. Electronically Signed   By: Monte Fantasia M.D.   On: 04/13/2019 07:08    Assessment/Plan Active Problems:   Acute on chronic respiratory failure with hypoxia (HCC)   Severe sepsis (HCC)   Chronic diastolic heart failure (HCC)   Lobar pneumonia, unspecified organism (HCC)   Chronic atrial fibrillation   1. Acute on chronic respiratory failure with hypoxia patient currently on 4 L via nasal cannula and tolerating well.  Continue secretion management pulmonary toilet 2. Severe sepsis resolved hemodynamically stable 3. Chronic diastolic heart failure at baseline 4. Low ammonia follow-up chest x-ray 5. Chronic atrial fibrillation rate controlled   I have personally seen and evaluated the patient, evaluated laboratory and imaging results, formulated the assessment and plan and placed orders. The Patient requires high complexity  decision making for assessment and support.  Case was discussed on Rounds with the Respiratory Therapy Staff  Allyne Gee, MD [facility] Pulmonary Critical Care Medicine Sleep Medicine

## 2019-04-14 DIAGNOSIS — I5032 Chronic diastolic (congestive) heart failure: Secondary | ICD-10-CM | POA: Diagnosis not present

## 2019-04-14 DIAGNOSIS — J181 Lobar pneumonia, unspecified organism: Secondary | ICD-10-CM | POA: Diagnosis not present

## 2019-04-14 DIAGNOSIS — J9621 Acute and chronic respiratory failure with hypoxia: Secondary | ICD-10-CM | POA: Diagnosis not present

## 2019-04-14 DIAGNOSIS — I482 Chronic atrial fibrillation, unspecified: Secondary | ICD-10-CM | POA: Diagnosis not present

## 2019-04-14 LAB — POTASSIUM: Potassium: 4 mmol/L (ref 3.5–5.1)

## 2019-04-14 NOTE — Progress Notes (Addendum)
Pulmonary Critical Care Medicine [place]   PULMONARY CRITICAL CARE SERVICE  PROGRESS NOTE  Date of Service: 04/14/2019  Andre [name]  [id number]  DOB: 1923-02-15   DOA: 04/03/2019  Referring Physician: Merton Border, MD  HPI: Andre [name] is a 85 y.o. male seen for follow up of Acute on Chronic Respiratory Failure.  Patient was increased to 10 L on the Oxymizer overnight due to desaturations.  Was reduced this morning back to 4 L currently on the Oxymizer and doing well.  Good saturations 90  Medications: Reviewed on Rounds  Physical Exam:  Vitals: Pulse 87 respirations 18 BP 127/73 O2 sat 100% temp 99.2  Ventilator Settings Oxymizer 4 L  . General: Comfortable at this time . Eyes: Grossly normal lids, irises & conjunctiva . ENT: grossly tongue is normal . Neck: no obvious mass . Cardiovascular: S1 S2 normal no gallop . Respiratory: No rales or rhonchi noted . Abdomen: soft . Skin: no rash seen on limited exam . Musculoskeletal: not rigid . Psychiatric:unable to assess . Neurologic: no seizure no involuntary movements         Lab Data:   Basic Metabolic Panel: Recent Labs  Lab 04/08/19 0715 04/10/19 0437 04/13/19 0311 04/14/19 0617  NA 138 139 140  --   K 4.1 3.8 3.3* 4.0  CL 99 100 100  --   CO2 30 31 28   --   GLUCOSE 111* 101* 101*  --   BUN 22 24* 24*  --   CREATININE 1.46* 1.32* 1.19  --   CALCIUM 7.9* 8.3* 8.1*  --   MG 2.3 2.1 1.9  --   PHOS  --  3.2 3.9  --     ABG: No results for input(s): PHART, PCO2ART, PO2ART, HCO3, O2SAT in the last 168 hours.  Liver Function Tests: Recent Labs  Lab 04/10/19 0437 04/13/19 0311  ALBUMIN 1.9* 1.8*   No results for input(s): LIPASE, AMYLASE in the last 168 hours. No results for input(s): AMMONIA in the last 168 hours.  CBC: Recent Labs  Lab 04/08/19 0715 04/10/19 0437 04/13/19 0311  WBC 12.8* 13.5* 11.8*  HGB 7.6* 8.1* 8.2*  HCT 24.2* 25.8* 26.6*  MCV 89.6 89.3  89.3  PLT 553* 549* 448*    Cardiac Enzymes: No results for input(s): CKTOTAL, CKMB, CKMBINDEX, TROPONINI in the last 168 hours.  BNP (last 3 results) Recent Labs    02/23/19 0833  BNP 1,628.2*    ProBNP (last 3 results) No results for input(s): PROBNP in the last 8760 hours.  Radiological Exams: Dg Chest Port 1 View  Result Date: 04/13/2019 CLINICAL DATA:  Pneumonia EXAM: PORTABLE CHEST 1 VIEW COMPARISON:  04/10/2019 FINDINGS: Cardiomegaly. Aortic calcification and tortuosity. Generalized interstitial coarsening with similar pattern. Probable trace effusions. A focal emphysema by CT. No pneumothorax. Stable dual-chamber pacer leads from the left. IMPRESSION: History of pneumonia with stable bilateral opacity superimposed on emphysema. Electronically Signed   By: Monte Fantasia M.D.   On: 04/13/2019 07:08    Assessment/Plan Active Problems:   Acute on chronic respiratory failure with hypoxia (HCC)   Severe sepsis (HCC)   Chronic diastolic heart failure (HCC)   Lobar pneumonia, unspecified organism (HCC)   Chronic atrial fibrillation   1. Acute on chronic respiratory failure with hypoxia patient currently on 4 L via Oxymizer.  Continue to titrate as tolerated.  Continue supportive measures 2. Severe sepsis resolved hemodynamically stable 3. Chronic diastolic heart failure at baseline 4. Lobar  pneumonia follow-up chest x-ray 5. Chronic atrial fibrillation rate controlled   I have personally seen and evaluated the patient, evaluated laboratory and imaging results, formulated the assessment and plan and placed orders. The Patient requires high complexity decision making for assessment and support.  Case was discussed on Rounds with the Respiratory Therapy Staff  Allyne Gee, MD [facility] Pulmonary Critical Care Medicine Sleep Medicine

## 2019-04-15 ENCOUNTER — Other Ambulatory Visit (HOSPITAL_COMMUNITY): Payer: Medicare Other

## 2019-04-15 DIAGNOSIS — J9621 Acute and chronic respiratory failure with hypoxia: Secondary | ICD-10-CM | POA: Diagnosis not present

## 2019-04-15 DIAGNOSIS — I482 Chronic atrial fibrillation, unspecified: Secondary | ICD-10-CM | POA: Diagnosis not present

## 2019-04-15 DIAGNOSIS — J181 Lobar pneumonia, unspecified organism: Secondary | ICD-10-CM | POA: Diagnosis not present

## 2019-04-15 DIAGNOSIS — I5032 Chronic diastolic (congestive) heart failure: Secondary | ICD-10-CM | POA: Diagnosis not present

## 2019-04-15 NOTE — Progress Notes (Addendum)
Pulmonary Critical Care Medicine [place]   PULMONARY CRITICAL CARE SERVICE  PROGRESS NOTE  Date of Service: 04/15/2019  LOGON UTTECH  GLO:[id number]  DOB: 1923/05/09   DOA: 04/03/2019  Referring Physician: Merton Border, MD  HPI: Andre [name] is a 85 y.o. male seen for follow up of Acute on Chronic Respiratory Failure.  Patient desaturated overnight and had to be increased to 9 L on the Oxymizer to get saturations back to normal.  Now during the day has been weaned down to 4 L via nasal cannula and is satting in the low 90s with no distress.  Medications: Reviewed on Rounds  Physical Exam:  Vitals: Pulse 102 respirations 18 BP 118/65 O2 sat on percent temp 98.3  Ventilator Settings 4 L of cannula  . General: Comfortable at this time . Eyes: Grossly normal lids, irises & conjunctiva . ENT: grossly tongue is normal . Neck: no obvious mass . Cardiovascular: S1 S2 normal no gallop . Respiratory: No rales or rhonchi noted . Abdomen: soft . Skin: no rash seen on limited exam . Musculoskeletal: not rigid . Psychiatric:unable to assess . Neurologic: no seizure no involuntary movements         Lab Data:   Basic Metabolic Panel: Recent Labs  Lab 04/10/19 0437 04/13/19 0311 04/14/19 0617  NA 139 140  --   K 3.8 3.3* 4.0  CL 100 100  --   CO2 31 28  --   GLUCOSE 101* 101*  --   BUN 24* 24*  --   CREATININE 1.32* 1.19  --   CALCIUM 8.3* 8.1*  --   MG 2.1 1.9  --   PHOS 3.2 3.9  --     ABG: No results for input(s): PHART, PCO2ART, PO2ART, HCO3, O2SAT in the last 168 hours.  Liver Function Tests: Recent Labs  Lab 04/10/19 0437 04/13/19 0311  ALBUMIN 1.9* 1.8*   No results for input(s): LIPASE, AMYLASE in the last 168 hours. No results for input(s): AMMONIA in the last 168 hours.  CBC: Recent Labs  Lab 04/10/19 0437 04/13/19 0311  WBC 13.5* 11.8*  HGB 8.1* 8.2*  HCT 25.8* 26.6*  MCV 89.3 89.3  PLT 549* 448*    Cardiac  Enzymes: No results for input(s): CKTOTAL, CKMB, CKMBINDEX, TROPONINI in the last 168 hours.  BNP (last 3 results) Recent Labs    02/23/19 0833  BNP 1,628.2*    ProBNP (last 3 results) No results for input(s): PROBNP in the last 8760 hours.  Radiological Exams: Dg Abd Portable 1v  Result Date: 04/15/2019 CLINICAL DATA:  Nausea and vomiting. EXAM: PORTABLE ABDOMEN - 1 VIEW COMPARISON:  None. FINDINGS: The bowel gas pattern is normal. There stool scattered throughout the colon with a normal amount of stool in the rectum. Multiple calcifications in the pelvis are most likely phleboliths. No significant bone abnormality.  Catheter in the bladder. IMPRESSION: Benign-appearing abdomen. Electronically Signed   By: Lorriane Shire M.D.   On: 04/15/2019 14:44    Assessment/Plan Active Problems:   Acute on chronic respiratory failure with hypoxia (HCC)   Severe sepsis (HCC)   Chronic diastolic heart failure (HCC)   Lobar pneumonia, unspecified organism (HCC)   Chronic atrial fibrillation   1. Acute on chronic respiratory failure with hypoxia currently on 4 L nasal cannula continue to titrate oxygen as tolerated.  Continue supportive measures 2. Severe sepsis resolved hemodynamically stable 3. Chronic diastolic heart failure at baseline 4. Lobar pneumonia follow-up chest  x-ray 5. Chronic atrial fibrillation rate controlled   I have personally seen and evaluated the patient, evaluated laboratory and imaging results, formulated the assessment and plan and placed orders. The Patient requires high complexity decision making for assessment and support.  Case was discussed on Rounds with the Respiratory Therapy Staff  Allyne Gee, MD [facility] Pulmonary Critical Care Medicine Sleep Medicine

## 2019-04-16 DIAGNOSIS — I482 Chronic atrial fibrillation, unspecified: Secondary | ICD-10-CM | POA: Diagnosis not present

## 2019-04-16 DIAGNOSIS — J181 Lobar pneumonia, unspecified organism: Secondary | ICD-10-CM | POA: Diagnosis not present

## 2019-04-16 DIAGNOSIS — J9621 Acute and chronic respiratory failure with hypoxia: Secondary | ICD-10-CM | POA: Diagnosis not present

## 2019-04-16 DIAGNOSIS — I5032 Chronic diastolic (congestive) heart failure: Secondary | ICD-10-CM | POA: Diagnosis not present

## 2019-04-16 NOTE — Progress Notes (Addendum)
Pulmonary Critical Care Medicine [place]   PULMONARY CRITICAL CARE SERVICE  PROGRESS NOTE  Date of Service: 04/16/2019  Andre [name]  MCN:[id number]  DOB: 04-11-1923   DOA: 04/03/2019  Referring Physician: Merton Border, MD  HPI: Andre [name] is a 85 y.o. male seen for follow up of Acute on Chronic Respiratory Failure.  Patient currently on 4 L of oxygen via nasal cannula.  Patient reportedly mouth breathes at night and requires Ventimask to prevent hypoxia.  Usually returns to nasal cannula in the morning when awake.  Medications: Reviewed on Rounds  Physical Exam:  Vitals: Pulse 97 respirations 23 BP 109/63 O2 sat 90% temp 97.6  Ventilator Settings 4 L nasal cannula  . General: Comfortable at this time . Eyes: Grossly normal lids, irises & conjunctiva . ENT: grossly tongue is normal . Neck: no obvious mass . Cardiovascular: S1 S2 normal no gallop . Respiratory: No rales or rhonchi noted . Abdomen: soft . Skin: no rash seen on limited exam . Musculoskeletal: not rigid . Psychiatric:unable to assess . Neurologic: no seizure no involuntary movements         Lab Data:   Basic Metabolic Panel: Recent Labs  Lab 04/10/19 0437 04/13/19 0311 04/14/19 0617  NA 139 140  --   K 3.8 3.3* 4.0  CL 100 100  --   CO2 31 28  --   GLUCOSE 101* 101*  --   BUN 24* 24*  --   CREATININE 1.32* 1.19  --   CALCIUM 8.3* 8.1*  --   MG 2.1 1.9  --   PHOS 3.2 3.9  --     ABG: No results for input(s): PHART, PCO2ART, PO2ART, HCO3, O2SAT in the last 168 hours.  Liver Function Tests: Recent Labs  Lab 04/10/19 0437 04/13/19 0311  ALBUMIN 1.9* 1.8*   No results for input(s): LIPASE, AMYLASE in the last 168 hours. No results for input(s): AMMONIA in the last 168 hours.  CBC: Recent Labs  Lab 04/10/19 0437 04/13/19 0311  WBC 13.5* 11.8*  HGB 8.1* 8.2*  HCT 25.8* 26.6*  MCV 89.3 89.3  PLT 549* 448*    Cardiac Enzymes: No results for  input(s): CKTOTAL, CKMB, CKMBINDEX, TROPONINI in the last 168 hours.  BNP (last 3 results) Recent Labs    02/23/19 0833  BNP 1,628.2*    ProBNP (last 3 results) No results for input(s): PROBNP in the last 8760 hours.  Radiological Exams: Dg Abd Portable 1v  Result Date: 04/15/2019 CLINICAL DATA:  Nausea and vomiting. EXAM: PORTABLE ABDOMEN - 1 VIEW COMPARISON:  None. FINDINGS: The bowel gas pattern is normal. There stool scattered throughout the colon with a normal amount of stool in the rectum. Multiple calcifications in the pelvis are most likely phleboliths. No significant bone abnormality.  Catheter in the bladder. IMPRESSION: Benign-appearing abdomen. Electronically Signed   By: Lorriane Shire M.D.   On: 04/15/2019 14:44    Assessment/Plan Active Problems:   Acute on chronic respiratory failure with hypoxia (HCC)   Severe sepsis (HCC)   Chronic diastolic heart failure (HCC)   Lobar pneumonia, unspecified organism (HCC)   Chronic atrial fibrillation   1. Acute on chronic respiratory failure with hypoxia currently on 4 L of cannula, continue titrate as tolerated.  Continue supportive measures especially at night due to desaturations from sleeping. 2. Severe sepsis resolved hemodynamically stable 3. Chronic diastolic heart failure at baseline 4. Lobar pneumonia follow-up chest x-ray 5. Chronic A. fib relation  rate controlled   I have personally seen and evaluated the patient, evaluated laboratory and imaging results, formulated the assessment and plan and placed orders. The Patient requires high complexity decision making for assessment and support.  Case was discussed on Rounds with the Respiratory Therapy Staff  Allyne Gee, MD [facility] Pulmonary Critical Care Medicine Sleep Medicine

## 2019-04-17 DIAGNOSIS — J9621 Acute and chronic respiratory failure with hypoxia: Secondary | ICD-10-CM | POA: Diagnosis not present

## 2019-04-17 DIAGNOSIS — I482 Chronic atrial fibrillation, unspecified: Secondary | ICD-10-CM | POA: Diagnosis not present

## 2019-04-17 DIAGNOSIS — J181 Lobar pneumonia, unspecified organism: Secondary | ICD-10-CM | POA: Diagnosis not present

## 2019-04-17 DIAGNOSIS — I5032 Chronic diastolic (congestive) heart failure: Secondary | ICD-10-CM | POA: Diagnosis not present

## 2019-04-17 LAB — BASIC METABOLIC PANEL
Anion gap: 9 (ref 5–15)
BUN: 28 mg/dL — ABNORMAL HIGH (ref 8–23)
CO2: 29 mmol/L (ref 22–32)
Calcium: 8.8 mg/dL — ABNORMAL LOW (ref 8.9–10.3)
Chloride: 105 mmol/L (ref 98–111)
Creatinine, Ser: 1.56 mg/dL — ABNORMAL HIGH (ref 0.61–1.24)
GFR calc Af Amer: 44 mL/min — ABNORMAL LOW (ref >60)
GFR calc non Af Amer: 38 mL/min — ABNORMAL LOW (ref >60)
Glucose, Bld: 114 mg/dL — ABNORMAL HIGH (ref 70–99)
Potassium: 3.7 mmol/L (ref 3.5–5.1)
Sodium: 143 mmol/L (ref 135–145)

## 2019-04-17 LAB — CBC
HCT: 31.1 % — ABNORMAL LOW (ref 39.0–52.0)
Hemoglobin: 9.5 g/dL — ABNORMAL LOW (ref 13.0–17.0)
MCH: 27.3 pg (ref 26.0–34.0)
MCHC: 30.5 g/dL (ref 30.0–36.0)
MCV: 89.4 fL (ref 80.0–100.0)
Platelets: 328 10*3/uL (ref 150–400)
RBC: 3.48 MIL/uL — ABNORMAL LOW (ref 4.22–5.81)
RDW: 14.9 % (ref 11.5–15.5)
WBC: 13 10*3/uL — ABNORMAL HIGH (ref 4.0–10.5)
nRBC: 0 % (ref 0.0–0.2)

## 2019-04-17 LAB — MAGNESIUM: Magnesium: 1.9 mg/dL (ref 1.7–2.4)

## 2019-04-17 NOTE — Progress Notes (Addendum)
Pulmonary Critical Care Medicine [place]   PULMONARY CRITICAL CARE SERVICE  PROGRESS NOTE  Date of Service: 04/17/2019  Andre [name]  [id number]  DOB: 18-Jun-1923   DOA: 04/03/2019  Referring Physician: Merton Border, MD  HPI: Andre [name] is a 85 y.o. male seen for follow up of Acute on Chronic Respiratory Failure.  Patient continues on 4 L of oxygen via nasal cannula does continue to desat at night require Ventimask.  No distress noted at this time.  Medications: Reviewed on Rounds  Physical Exam:  Vitals: Pulse 102 respirations 30 3P 107/88 O2 sat 96% temp 97.7  Ventilator Settings 4 L nasal cannula  . General: Comfortable at this time . Eyes: Grossly normal lids, irises & conjunctiva . ENT: grossly tongue is normal . Neck: no obvious mass . Cardiovascular: S1 S2 normal no gallop . Respiratory: No rales or rhonchi noted . Abdomen: soft . Skin: no rash seen on limited exam . Musculoskeletal: not rigid . Psychiatric:unable to assess . Neurologic: no seizure no involuntary movements         Lab Data:   Basic Metabolic Panel: Recent Labs  Lab 04/13/19 0311 04/14/19 0617 04/17/19 0553  NA 140  --  143  K 3.3* 4.0 3.7  CL 100  --  105  CO2 28  --  29  GLUCOSE 101*  --  114*  BUN 24*  --  28*  CREATININE 1.19  --  1.56*  CALCIUM 8.1*  --  8.8*  MG 1.9  --  1.9  PHOS 3.9  --   --     ABG: No results for input(s): PHART, PCO2ART, PO2ART, HCO3, O2SAT in the last 168 hours.  Liver Function Tests: Recent Labs  Lab 04/13/19 0311  ALBUMIN 1.8*   No results for input(s): LIPASE, AMYLASE in the last 168 hours. No results for input(s): AMMONIA in the last 168 hours.  CBC: Recent Labs  Lab 04/13/19 0311 04/17/19 0553  WBC 11.8* 13.0*  HGB 8.2* 9.5*  HCT 26.6* 31.1*  MCV 89.3 89.4  PLT 448* 328    Cardiac Enzymes: No results for input(s): CKTOTAL, CKMB, CKMBINDEX, TROPONINI in the last 168 hours.  BNP (last 3  results) Recent Labs    02/23/19 0833  BNP 1,628.2*    ProBNP (last 3 results) No results for input(s): PROBNP in the last 8760 hours.  Radiological Exams: No results found.  Assessment/Plan Active Problems:   Acute on chronic respiratory failure with hypoxia (HCC)   Severe sepsis (HCC)   Chronic diastolic heart failure (HCC)   Lobar pneumonia, unspecified organism (HCC)   Chronic atrial fibrillation   1. Acute on chronic respiratory failure with hypoxia currently on 4 L oxygen during the day and having to be on Ventimask late at night with due to saturations. 2. Severe sepsis resolved hemodynamically stable 3. Chronic diastolic heart failure at baseline 4. Avoid pneumonia follow-up chest x-ray 5. Chronic atrial fibrillation rate controlled   I have personally seen and evaluated the patient, evaluated laboratory and imaging results, formulated the assessment and plan and placed orders. The Patient requires high complexity decision making for assessment and support.  Case was discussed on Rounds with the Respiratory Therapy Staff  Allyne Gee, MD [facility] Pulmonary Critical Care Medicine Sleep Medicine

## 2019-04-18 DIAGNOSIS — J9621 Acute and chronic respiratory failure with hypoxia: Secondary | ICD-10-CM | POA: Diagnosis not present

## 2019-04-18 DIAGNOSIS — J181 Lobar pneumonia, unspecified organism: Secondary | ICD-10-CM | POA: Diagnosis not present

## 2019-04-18 DIAGNOSIS — I482 Chronic atrial fibrillation, unspecified: Secondary | ICD-10-CM | POA: Diagnosis not present

## 2019-04-18 DIAGNOSIS — I5032 Chronic diastolic (congestive) heart failure: Secondary | ICD-10-CM | POA: Diagnosis not present

## 2019-04-18 LAB — BASIC METABOLIC PANEL
Anion gap: 12 (ref 5–15)
BUN: 23 mg/dL (ref 8–23)
CO2: 29 mmol/L (ref 22–32)
Calcium: 8.8 mg/dL — ABNORMAL LOW (ref 8.9–10.3)
Chloride: 100 mmol/L (ref 98–111)
Creatinine, Ser: 1.37 mg/dL — ABNORMAL HIGH (ref 0.61–1.24)
GFR calc Af Amer: 51 mL/min — ABNORMAL LOW (ref >60)
GFR calc non Af Amer: 44 mL/min — ABNORMAL LOW (ref >60)
Glucose, Bld: 123 mg/dL — ABNORMAL HIGH (ref 70–99)
Potassium: 3.5 mmol/L (ref 3.5–5.1)
Sodium: 141 mmol/L (ref 135–145)

## 2019-04-18 NOTE — Progress Notes (Addendum)
Pulmonary Critical Care Medicine [place]   PULMONARY CRITICAL CARE SERVICE  PROGRESS NOTE  Date of Service: 04/18/2019  ECHO ALLSBROOK  [id number]  DOB: 04/15/1923   DOA: 04/03/2019  Referring Physician: Merton Border, MD  HPI: Andre [name] is a 85 y.o. male seen for follow up of Acute on Chronic Respiratory Failure.  Patient remains on 4 L of oxygen via nasal cannula did not require BiPAP last night however about 5 AM was placed on Ventimask to keep saturations above 80%.  Medications: Reviewed on Rounds  Physical Exam:  Vitals: Pulse 93 respirations 31 BP 128/64 O2 sat 98% temp 97.7  Ventilator Settings 4 L nasal cannula  . General: Comfortable at this time . Eyes: Grossly normal lids, irises & conjunctiva . ENT: grossly tongue is normal . Neck: no obvious mass . Cardiovascular: S1 S2 normal no gallop . Respiratory: No rales or rhonchi noted . Abdomen: soft . Skin: no rash seen on limited exam . Musculoskeletal: not rigid . Psychiatric:unable to assess . Neurologic: no seizure no involuntary movements         Lab Data:   Basic Metabolic Panel: Recent Labs  Lab 04/13/19 0311 04/14/19 0617 04/17/19 0553 04/18/19 0534  NA 140  --  143 141  K 3.3* 4.0 3.7 3.5  CL 100  --  105 100  CO2 28  --  29 29  GLUCOSE 101*  --  114* 123*  BUN 24*  --  28* 23  CREATININE 1.19  --  1.56* 1.37*  CALCIUM 8.1*  --  8.8* 8.8*  MG 1.9  --  1.9  --   PHOS 3.9  --   --   --     ABG: No results for input(s): PHART, PCO2ART, PO2ART, HCO3, O2SAT in the last 168 hours.  Liver Function Tests: Recent Labs  Lab 04/13/19 0311  ALBUMIN 1.8*   No results for input(s): LIPASE, AMYLASE in the last 168 hours. No results for input(s): AMMONIA in the last 168 hours.  CBC: Recent Labs  Lab 04/13/19 0311 04/17/19 0553  WBC 11.8* 13.0*  HGB 8.2* 9.5*  HCT 26.6* 31.1*  MCV 89.3 89.4  PLT 448* 328    Cardiac Enzymes: No results for input(s):  CKTOTAL, CKMB, CKMBINDEX, TROPONINI in the last 168 hours.  BNP (last 3 results) Recent Labs    02/23/19 0833  BNP 1,628.2*    ProBNP (last 3 results) No results for input(s): PROBNP in the last 8760 hours.  Radiological Exams: No results found.  Assessment/Plan Active Problems:   Acute on chronic respiratory failure with hypoxia (HCC)   Severe sepsis (HCC)   Chronic diastolic heart failure (HCC)   Lobar pneumonia, unspecified organism (HCC)   Chronic atrial fibrillation   1. Acute on chronic respiratory failure with hypoxia currently on 4 L of oxygen doing well.  Did require Ventimask for few hours early this morning due to desaturations.  Continue supportive measures 2. Severe sepsis resolved hemodynamically stable 3. Chronic diastolic heart failure at baseline 4. Lobar pneumonia follow-up chest x-ray 5. Chronic atrial fibrillation rate controlled   I have personally seen and evaluated the patient, evaluated laboratory and imaging results, formulated the assessment and plan and placed orders. The Patient requires high complexity decision making for assessment and support.  Case was discussed on Rounds with the Respiratory Therapy Staff  Allyne Gee, MD [facility] Pulmonary Critical Care Medicine Sleep Medicine

## 2019-04-19 ENCOUNTER — Other Ambulatory Visit (HOSPITAL_COMMUNITY): Payer: Medicare Other

## 2019-04-19 DIAGNOSIS — I5032 Chronic diastolic (congestive) heart failure: Secondary | ICD-10-CM | POA: Diagnosis not present

## 2019-04-19 DIAGNOSIS — J9621 Acute and chronic respiratory failure with hypoxia: Secondary | ICD-10-CM | POA: Diagnosis not present

## 2019-04-19 DIAGNOSIS — J181 Lobar pneumonia, unspecified organism: Secondary | ICD-10-CM | POA: Diagnosis not present

## 2019-04-19 DIAGNOSIS — I482 Chronic atrial fibrillation, unspecified: Secondary | ICD-10-CM | POA: Diagnosis not present

## 2019-04-19 NOTE — Progress Notes (Addendum)
Pulmonary Critical Care Medicine [place]   PULMONARY CRITICAL CARE SERVICE  PROGRESS NOTE  Date of Service: 04/19/2019  Andre [name]  [id number]  DOB: September 14, 1923   DOA: 04/03/2019  Referring Physician: Merton Border, MD  HPI: Andre [name] is a 85 y.o. male seen for follow up of Acute on Chronic Respiratory Failure.  Patient remains on 4 L nasal cannula will have swallow study today.  Satting well with no distress.  Medications: Reviewed on Rounds  Physical Exam:  Vitals: Pulse 80 respirations 20 BP 94/60 O2 sat 98% temp 98.0  Ventilator Settings 4 L nasal cannula  . General: Comfortable at this time . Eyes: Grossly normal lids, irises & conjunctiva . ENT: grossly tongue is normal . Neck: no obvious mass . Cardiovascular: S1 S2 normal no gallop . Respiratory: No rales or rhonchi noted . Abdomen: soft . Skin: no rash seen on limited exam . Musculoskeletal: not rigid . Psychiatric:unable to assess . Neurologic: no seizure no involuntary movements         Lab Data:   Basic Metabolic Panel: Recent Labs  Lab 04/13/19 0311 04/14/19 0617 04/17/19 0553 04/18/19 0534  NA 140  --  143 141  K 3.3* 4.0 3.7 3.5  CL 100  --  105 100  CO2 28  --  29 29  GLUCOSE 101*  --  114* 123*  BUN 24*  --  28* 23  CREATININE 1.19  --  1.56* 1.37*  CALCIUM 8.1*  --  8.8* 8.8*  MG 1.9  --  1.9  --   PHOS 3.9  --   --   --     ABG: No results for input(s): PHART, PCO2ART, PO2ART, HCO3, O2SAT in the last 168 hours.  Liver Function Tests: Recent Labs  Lab 04/13/19 0311  ALBUMIN 1.8*   No results for input(s): LIPASE, AMYLASE in the last 168 hours. No results for input(s): AMMONIA in the last 168 hours.  CBC: Recent Labs  Lab 04/13/19 0311 04/17/19 0553  WBC 11.8* 13.0*  HGB 8.2* 9.5*  HCT 26.6* 31.1*  MCV 89.3 89.4  PLT 448* 328    Cardiac Enzymes: No results for input(s): CKTOTAL, CKMB, CKMBINDEX, TROPONINI in the last 168  hours.  BNP (last 3 results) Recent Labs    02/23/19 0833  BNP 1,628.2*    ProBNP (last 3 results) No results for input(s): PROBNP in the last 8760 hours.  Radiological Exams: No results found.  Assessment/Plan Active Problems:   Acute on chronic respiratory failure with hypoxia (HCC)   Severe sepsis (HCC)   Chronic diastolic heart failure (HCC)   Lobar pneumonia, unspecified organism (HCC)   Chronic atrial fibrillation   1. Acute on chronic respiratory failure with hypoxia continue on 4 L of oxygen via nasal cannula.  Continue supportive measures and pulmonary toilet 2. Severe sepsis resolved hemodynamically stable 3. Chronic diastolic heart failure at baseline 4. Lobar pneumonia follow-up chest x-ray 5. Chronic atrial fibrillation rate controlled   I have personally seen and evaluated the patient, evaluated laboratory and imaging results, formulated the assessment and plan and placed orders. The Patient requires high complexity decision making for assessment and support.  Case was discussed on Rounds with the Respiratory Therapy Staff  Allyne Gee, MD [facility] Pulmonary Critical Care Medicine Sleep Medicine

## 2019-04-20 DIAGNOSIS — I482 Chronic atrial fibrillation, unspecified: Secondary | ICD-10-CM | POA: Diagnosis not present

## 2019-04-20 DIAGNOSIS — J181 Lobar pneumonia, unspecified organism: Secondary | ICD-10-CM | POA: Diagnosis not present

## 2019-04-20 DIAGNOSIS — I5032 Chronic diastolic (congestive) heart failure: Secondary | ICD-10-CM | POA: Diagnosis not present

## 2019-04-20 DIAGNOSIS — J9621 Acute and chronic respiratory failure with hypoxia: Secondary | ICD-10-CM | POA: Diagnosis not present

## 2019-04-20 LAB — CBC
HCT: 28.5 % — ABNORMAL LOW (ref 39.0–52.0)
Hemoglobin: 8.8 g/dL — ABNORMAL LOW (ref 13.0–17.0)
MCH: 27.2 pg (ref 26.0–34.0)
MCHC: 30.9 g/dL (ref 30.0–36.0)
MCV: 88.2 fL (ref 80.0–100.0)
Platelets: 294 10*3/uL (ref 150–400)
RBC: 3.23 MIL/uL — ABNORMAL LOW (ref 4.22–5.81)
RDW: 15 % (ref 11.5–15.5)
WBC: 11.5 10*3/uL — ABNORMAL HIGH (ref 4.0–10.5)
nRBC: 0 % (ref 0.0–0.2)

## 2019-04-20 LAB — BASIC METABOLIC PANEL
Anion gap: 9 (ref 5–15)
BUN: 14 mg/dL (ref 8–23)
CO2: 32 mmol/L (ref 22–32)
Calcium: 8.4 mg/dL — ABNORMAL LOW (ref 8.9–10.3)
Chloride: 101 mmol/L (ref 98–111)
Creatinine, Ser: 1.25 mg/dL — ABNORMAL HIGH (ref 0.61–1.24)
GFR calc Af Amer: 57 mL/min — ABNORMAL LOW (ref >60)
GFR calc non Af Amer: 49 mL/min — ABNORMAL LOW (ref >60)
Glucose, Bld: 108 mg/dL — ABNORMAL HIGH (ref 70–99)
Potassium: 3.5 mmol/L (ref 3.5–5.1)
Sodium: 142 mmol/L (ref 135–145)

## 2019-04-20 LAB — MAGNESIUM: Magnesium: 1.9 mg/dL (ref 1.7–2.4)

## 2019-04-20 NOTE — Progress Notes (Addendum)
Pulmonary Critical Care Medicine [place]   PULMONARY CRITICAL CARE SERVICE  PROGRESS NOTE  Date of Service: 04/20/2019  Andre [name]  [id number]  DOB: Apr 23, 1923   DOA: 04/03/2019  Referring Physician: Merton Border, MD  HPI: Andre [name] is a 85 y.o. male seen for follow up of Acute on Chronic Respiratory Failure.  Patient continues on 4 L of oxygen via nasal cannula does still need the 50% Ventimask at night for desaturations at times.  Overall doing well.  Medications: Reviewed on Rounds  Physical Exam:  Vitals: Pulse 88 respirations 24 BP 147/66 O2 sat 95% temp 98.5  Ventilator Settings 4 L nasal cannula  . General: Comfortable at this time . Eyes: Grossly normal lids, irises & conjunctiva . ENT: grossly tongue is normal . Neck: no obvious mass . Cardiovascular: S1 S2 normal no gallop . Respiratory: No rales or rhonchi . Abdomen: soft . Skin: no rash seen on limited exam . Musculoskeletal: not rigid . Psychiatric:unable to assess . Neurologic: no seizure no involuntary movements         Lab Data:   Basic Metabolic Panel: Recent Labs  Lab 04/14/19 0617 04/17/19 0553 04/18/19 0534 04/20/19 0425  NA  --  143 141 142  K 4.0 3.7 3.5 3.5  CL  --  105 100 101  CO2  --  29 29 32  GLUCOSE  --  114* 123* 108*  BUN  --  28* 23 14  CREATININE  --  1.56* 1.37* 1.25*  CALCIUM  --  8.8* 8.8* 8.4*  MG  --  1.9  --  1.9    ABG: No results for input(s): PHART, PCO2ART, PO2ART, HCO3, O2SAT in the last 168 hours.  Liver Function Tests: No results for input(s): AST, ALT, ALKPHOS, BILITOT, PROT, ALBUMIN in the last 168 hours. No results for input(s): LIPASE, AMYLASE in the last 168 hours. No results for input(s): AMMONIA in the last 168 hours.  CBC: Recent Labs  Lab 04/17/19 0553 04/20/19 0425  WBC 13.0* 11.5*  HGB 9.5* 8.8*  HCT 31.1* 28.5*  MCV 89.4 88.2  PLT 328 294    Cardiac Enzymes: No results for input(s): CKTOTAL,  CKMB, CKMBINDEX, TROPONINI in the last 168 hours.  BNP (last 3 results) Recent Labs    02/23/19 0833  BNP 1,628.2*    ProBNP (last 3 results) No results for input(s): PROBNP in the last 8760 hours.  Radiological Exams: No results found.  Assessment/Plan Active Problems:   Acute on chronic respiratory failure with hypoxia (HCC)   Severe sepsis (HCC)   Chronic diastolic heart failure (HCC)   Lobar pneumonia, unspecified organism (HCC)   Chronic atrial fibrillation   1. Acute on chronic respiratory failure with hypoxia continue on 4 L of oxygen via nasal cannula continue supportive measures and pulmonary toilet 2. Severe sepsis resolved hemodynamically stable 3. Chronic diastolic heart failure at baseline 4. Lobar pneumonia follow-up chest x-ray 5. Chronic atrial fibrillation rate controlled   I have personally seen and evaluated the patient, evaluated laboratory and imaging results, formulated the assessment and plan and placed orders. The Patient requires high complexity decision making for assessment and support.  Case was discussed on Rounds with the Respiratory Therapy Staff  Allyne Gee, MD [facility] Pulmonary Critical Care Medicine Sleep Medicine

## 2019-04-24 ENCOUNTER — Telehealth: Payer: Self-pay | Admitting: Physician Assistant

## 2019-04-24 NOTE — Telephone Encounter (Signed)
Thank you for letting me know

## 2019-04-24 NOTE — Telephone Encounter (Signed)
Called to pre reg patient for 04/25/19 appt. Patient stated his daughter said she cancelled appt due to him being in rehab.

## 2019-04-25 ENCOUNTER — Telehealth: Payer: Medicare Other | Admitting: Physician Assistant

## 2019-04-30 ENCOUNTER — Encounter (HOSPITAL_COMMUNITY): Payer: Medicare Other

## 2019-05-14 ENCOUNTER — Telehealth: Payer: Self-pay

## 2019-05-14 NOTE — Telephone Encounter (Signed)
I called and left patient a message about switching 05/30/19 office visit to a telehealth visit.

## 2019-05-15 ENCOUNTER — Encounter (HOSPITAL_COMMUNITY): Payer: Self-pay | Admitting: Internal Medicine

## 2019-05-15 ENCOUNTER — Inpatient Hospital Stay (HOSPITAL_COMMUNITY)
Admission: EM | Admit: 2019-05-15 | Discharge: 2019-05-21 | DRG: 291 | Disposition: A | Discharge status: Home Health | Payer: Medicare Other | Attending: Internal Medicine | Admitting: Internal Medicine

## 2019-05-15 ENCOUNTER — Emergency Department (HOSPITAL_COMMUNITY): Payer: Medicare Other

## 2019-05-15 ENCOUNTER — Other Ambulatory Visit: Payer: Self-pay

## 2019-05-15 DIAGNOSIS — L89892 Pressure ulcer of other site, stage 2: Secondary | ICD-10-CM

## 2019-05-15 DIAGNOSIS — I5033 Acute on chronic diastolic (congestive) heart failure: Secondary | ICD-10-CM | POA: Diagnosis present

## 2019-05-15 DIAGNOSIS — N183 Chronic kidney disease, stage 3 (moderate): Secondary | ICD-10-CM | POA: Diagnosis present

## 2019-05-15 DIAGNOSIS — I482 Chronic atrial fibrillation, unspecified: Secondary | ICD-10-CM | POA: Diagnosis present

## 2019-05-15 DIAGNOSIS — Z950 Presence of cardiac pacemaker: Secondary | ICD-10-CM

## 2019-05-15 DIAGNOSIS — I739 Peripheral vascular disease, unspecified: Secondary | ICD-10-CM | POA: Diagnosis present

## 2019-05-15 DIAGNOSIS — I10 Essential (primary) hypertension: Secondary | ICD-10-CM | POA: Diagnosis not present

## 2019-05-15 DIAGNOSIS — R339 Retention of urine, unspecified: Secondary | ICD-10-CM | POA: Diagnosis present

## 2019-05-15 DIAGNOSIS — E785 Hyperlipidemia, unspecified: Secondary | ICD-10-CM | POA: Diagnosis present

## 2019-05-15 DIAGNOSIS — Y846 Urinary catheterization as the cause of abnormal reaction of the patient, or of later complication, without mention of misadventure at the time of the procedure: Secondary | ICD-10-CM | POA: Diagnosis present

## 2019-05-15 DIAGNOSIS — N179 Acute kidney failure, unspecified: Secondary | ICD-10-CM | POA: Diagnosis not present

## 2019-05-15 DIAGNOSIS — R5381 Other malaise: Secondary | ICD-10-CM | POA: Diagnosis present

## 2019-05-15 DIAGNOSIS — E8809 Other disorders of plasma-protein metabolism, not elsewhere classified: Secondary | ICD-10-CM | POA: Diagnosis present

## 2019-05-15 DIAGNOSIS — E7800 Pure hypercholesterolemia, unspecified: Secondary | ICD-10-CM

## 2019-05-15 DIAGNOSIS — H3530 Unspecified macular degeneration: Secondary | ICD-10-CM | POA: Diagnosis present

## 2019-05-15 DIAGNOSIS — I480 Paroxysmal atrial fibrillation: Secondary | ICD-10-CM | POA: Diagnosis present

## 2019-05-15 DIAGNOSIS — E876 Hypokalemia: Secondary | ICD-10-CM | POA: Diagnosis present

## 2019-05-15 DIAGNOSIS — N390 Urinary tract infection, site not specified: Secondary | ICD-10-CM | POA: Diagnosis present

## 2019-05-15 DIAGNOSIS — Z86010 Personal history of colonic polyps: Secondary | ICD-10-CM | POA: Diagnosis not present

## 2019-05-15 DIAGNOSIS — Z20828 Contact with and (suspected) exposure to other viral communicable diseases: Secondary | ICD-10-CM | POA: Diagnosis present

## 2019-05-15 DIAGNOSIS — C61 Malignant neoplasm of prostate: Secondary | ICD-10-CM | POA: Diagnosis present

## 2019-05-15 DIAGNOSIS — Z87891 Personal history of nicotine dependence: Secondary | ICD-10-CM

## 2019-05-15 DIAGNOSIS — I442 Atrioventricular block, complete: Secondary | ICD-10-CM

## 2019-05-15 DIAGNOSIS — Z87442 Personal history of urinary calculi: Secondary | ICD-10-CM

## 2019-05-15 DIAGNOSIS — I130 Hypertensive heart and chronic kidney disease with heart failure and stage 1 through stage 4 chronic kidney disease, or unspecified chronic kidney disease: Principal | ICD-10-CM | POA: Diagnosis present

## 2019-05-15 DIAGNOSIS — I4819 Other persistent atrial fibrillation: Secondary | ICD-10-CM | POA: Diagnosis not present

## 2019-05-15 DIAGNOSIS — I4891 Unspecified atrial fibrillation: Secondary | ICD-10-CM | POA: Diagnosis present

## 2019-05-15 DIAGNOSIS — T83518A Infection and inflammatory reaction due to other urinary catheter, initial encounter: Secondary | ICD-10-CM | POA: Diagnosis present

## 2019-05-15 DIAGNOSIS — Z7901 Long term (current) use of anticoagulants: Secondary | ICD-10-CM | POA: Diagnosis not present

## 2019-05-15 DIAGNOSIS — I248 Other forms of acute ischemic heart disease: Secondary | ICD-10-CM | POA: Diagnosis present

## 2019-05-15 DIAGNOSIS — N189 Chronic kidney disease, unspecified: Secondary | ICD-10-CM | POA: Diagnosis present

## 2019-05-15 DIAGNOSIS — L89152 Pressure ulcer of sacral region, stage 2: Secondary | ICD-10-CM | POA: Diagnosis present

## 2019-05-15 DIAGNOSIS — D649 Anemia, unspecified: Secondary | ICD-10-CM

## 2019-05-15 DIAGNOSIS — I509 Heart failure, unspecified: Secondary | ICD-10-CM | POA: Diagnosis not present

## 2019-05-15 DIAGNOSIS — L8990 Pressure ulcer of unspecified site, unspecified stage: Secondary | ICD-10-CM | POA: Diagnosis present

## 2019-05-15 DIAGNOSIS — Z85828 Personal history of other malignant neoplasm of skin: Secondary | ICD-10-CM | POA: Diagnosis not present

## 2019-05-15 DIAGNOSIS — J9621 Acute and chronic respiratory failure with hypoxia: Secondary | ICD-10-CM | POA: Diagnosis present

## 2019-05-15 DIAGNOSIS — Z79899 Other long term (current) drug therapy: Secondary | ICD-10-CM

## 2019-05-15 DIAGNOSIS — Z9981 Dependence on supplemental oxygen: Secondary | ICD-10-CM

## 2019-05-15 DIAGNOSIS — R0902 Hypoxemia: Secondary | ICD-10-CM | POA: Diagnosis present

## 2019-05-15 DIAGNOSIS — Z7902 Long term (current) use of antithrombotics/antiplatelets: Secondary | ICD-10-CM

## 2019-05-15 LAB — CBC WITH DIFFERENTIAL/PLATELET
Abs Immature Granulocytes: 0.07 10*3/uL (ref 0.00–0.07)
Basophils Absolute: 0.1 10*3/uL (ref 0.0–0.1)
Basophils Relative: 1 %
Eosinophils Absolute: 0.2 10*3/uL (ref 0.0–0.5)
Eosinophils Relative: 2 %
HCT: 26.6 % — ABNORMAL LOW (ref 39.0–52.0)
Hemoglobin: 8 g/dL — ABNORMAL LOW (ref 13.0–17.0)
Immature Granulocytes: 1 %
Lymphocytes Relative: 21 %
Lymphs Abs: 2.3 10*3/uL (ref 0.7–4.0)
MCH: 26.8 pg (ref 26.0–34.0)
MCHC: 30.1 g/dL (ref 30.0–36.0)
MCV: 89.3 fL (ref 80.0–100.0)
Monocytes Absolute: 0.7 10*3/uL (ref 0.1–1.0)
Monocytes Relative: 7 %
Neutro Abs: 7.4 10*3/uL (ref 1.7–7.7)
Neutrophils Relative %: 68 %
Platelets: 382 10*3/uL (ref 150–400)
RBC: 2.98 MIL/uL — ABNORMAL LOW (ref 4.22–5.81)
RDW: 16.7 % — ABNORMAL HIGH (ref 11.5–15.5)
WBC: 10.7 10*3/uL — ABNORMAL HIGH (ref 4.0–10.5)
nRBC: 0 % (ref 0.0–0.2)

## 2019-05-15 LAB — HEPATIC FUNCTION PANEL
ALT: 19 U/L (ref 0–44)
AST: 16 U/L (ref 15–41)
Albumin: 2.2 g/dL — ABNORMAL LOW (ref 3.5–5.0)
Alkaline Phosphatase: 64 U/L (ref 38–126)
Bilirubin, Direct: 0.1 mg/dL (ref 0.0–0.2)
Indirect Bilirubin: 0.1 mg/dL — ABNORMAL LOW (ref 0.3–0.9)
Total Bilirubin: 0.2 mg/dL — ABNORMAL LOW (ref 0.3–1.2)
Total Protein: 6.3 g/dL — ABNORMAL LOW (ref 6.5–8.1)

## 2019-05-15 LAB — BLOOD GAS, VENOUS
Acid-Base Excess: 2.9 mmol/L — ABNORMAL HIGH (ref 0.0–2.0)
Bicarbonate: 28 mmol/L (ref 20.0–28.0)
O2 Saturation: 67.8 %
Patient temperature: 98.6
pCO2, Ven: 48.4 mmHg (ref 44.0–60.0)
pH, Ven: 7.38 (ref 7.250–7.430)
pO2, Ven: 39.9 mmHg (ref 32.0–45.0)

## 2019-05-15 LAB — BASIC METABOLIC PANEL
Anion gap: 9 (ref 5–15)
BUN: 21 mg/dL (ref 8–23)
CO2: 26 mmol/L (ref 22–32)
Calcium: 8.2 mg/dL — ABNORMAL LOW (ref 8.9–10.3)
Chloride: 102 mmol/L (ref 98–111)
Creatinine, Ser: 1.63 mg/dL — ABNORMAL HIGH (ref 0.61–1.24)
GFR calc Af Amer: 41 mL/min — ABNORMAL LOW (ref >60)
GFR calc non Af Amer: 36 mL/min — ABNORMAL LOW (ref >60)
Glucose, Bld: 138 mg/dL — ABNORMAL HIGH (ref 70–99)
Potassium: 4 mmol/L (ref 3.5–5.1)
Sodium: 137 mmol/L (ref 135–145)

## 2019-05-15 LAB — URINALYSIS, ROUTINE W REFLEX MICROSCOPIC
Bilirubin Urine: NEGATIVE
Glucose, UA: NEGATIVE mg/dL
Hgb urine dipstick: NEGATIVE
Ketones, ur: NEGATIVE mg/dL
Nitrite: NEGATIVE
Protein, ur: NEGATIVE mg/dL
Specific Gravity, Urine: 1.005 (ref 1.005–1.030)
pH: 6 (ref 5.0–8.0)

## 2019-05-15 LAB — TROPONIN I
Troponin I: 0.04 ng/mL (ref <0.03)
Troponin I: 0.03 ng/mL (ref <0.03)

## 2019-05-15 LAB — BRAIN NATRIURETIC PEPTIDE: B Natriuretic Peptide: 1112.4 pg/mL — ABNORMAL HIGH (ref 0.0–100.0)

## 2019-05-15 LAB — LACTIC ACID, PLASMA: Lactic Acid, Venous: 1.2 mmol/L (ref 0.5–1.9)

## 2019-05-15 LAB — MRSA PCR SCREENING: MRSA by PCR: NEGATIVE

## 2019-05-15 LAB — PROCALCITONIN: Procalcitonin: 0.36 ng/mL

## 2019-05-15 LAB — SARS CORONAVIRUS 2 BY RT PCR (HOSPITAL ORDER, PERFORMED IN ~~LOC~~ HOSPITAL LAB): SARS Coronavirus 2: NEGATIVE

## 2019-05-15 MED ORDER — FUROSEMIDE 10 MG/ML IJ SOLN
40.0000 mg | Freq: Once | INTRAMUSCULAR | Status: AC
  Administered 2019-05-15: 20:00:00 40 mg via INTRAVENOUS
  Filled 2019-05-15: qty 4

## 2019-05-15 MED ORDER — FUROSEMIDE 10 MG/ML IJ SOLN
60.0000 mg | Freq: Two times a day (BID) | INTRAMUSCULAR | Status: DC
  Administered 2019-05-16 – 2019-05-20 (×8): 60 mg via INTRAVENOUS
  Filled 2019-05-15 (×8): qty 6

## 2019-05-15 MED ORDER — ONDANSETRON HCL 4 MG/2ML IJ SOLN: 4.0000 mg | Freq: Four times a day (QID) | INTRAMUSCULAR | Status: DC | PRN

## 2019-05-15 MED ORDER — FUROSEMIDE 10 MG/ML IJ SOLN
40.0000 mg | Freq: Once | INTRAMUSCULAR | Status: AC
  Administered 2019-05-15: 40 mg via INTRAVENOUS
  Filled 2019-05-15: qty 4

## 2019-05-15 MED ORDER — CHLORHEXIDINE GLUCONATE CLOTH 2 % EX PADS
6.0000 | MEDICATED_PAD | Freq: Every day | CUTANEOUS | Status: DC
  Administered 2019-05-16 – 2019-05-18 (×3): 6 via TOPICAL

## 2019-05-15 MED ORDER — ATORVASTATIN CALCIUM 10 MG PO TABS
10.0000 mg | ORAL_TABLET | Freq: Every day | ORAL | Status: DC
  Administered 2019-05-16 – 2019-05-21 (×6): 10 mg via ORAL
  Filled 2019-05-15 (×6): qty 1

## 2019-05-15 MED ORDER — HYDROCODONE-ACETAMINOPHEN 5-325 MG PO TABS: 1.0000 | ORAL_TABLET | ORAL | Status: DC | PRN

## 2019-05-15 MED ORDER — ALBUTEROL SULFATE HFA 108 (90 BASE) MCG/ACT IN AERS: 1.0000 | INHALATION_SPRAY | RESPIRATORY_TRACT | Status: DC | PRN

## 2019-05-15 MED ORDER — SODIUM CHLORIDE 0.9% FLUSH: 3.0000 mL | INTRAVENOUS | Status: DC | PRN

## 2019-05-15 MED ORDER — ACETAMINOPHEN 650 MG RE SUPP: 650.0000 mg | Freq: Four times a day (QID) | RECTAL | Status: DC | PRN

## 2019-05-15 MED ORDER — APIXABAN 2.5 MG PO TABS
2.5000 mg | ORAL_TABLET | Freq: Two times a day (BID) | ORAL | Status: DC
  Administered 2019-05-15 – 2019-05-21 (×12): 2.5 mg via ORAL
  Filled 2019-05-15 (×12): qty 1

## 2019-05-15 MED ORDER — ONDANSETRON HCL 4 MG PO TABS: 4.0000 mg | ORAL_TABLET | Freq: Four times a day (QID) | ORAL | Status: DC | PRN

## 2019-05-15 MED ORDER — IPRATROPIUM-ALBUTEROL 0.5-2.5 (3) MG/3ML IN SOLN: 3.0000 mL | RESPIRATORY_TRACT | Status: DC | PRN

## 2019-05-15 MED ORDER — ACETAMINOPHEN 325 MG PO TABS: 650.0000 mg | ORAL_TABLET | Freq: Four times a day (QID) | ORAL | Status: DC | PRN

## 2019-05-15 MED ORDER — AMIODARONE HCL 200 MG PO TABS
200.0000 mg | ORAL_TABLET | Freq: Every day | ORAL | Status: DC
  Administered 2019-05-16 – 2019-05-21 (×6): 200 mg via ORAL
  Filled 2019-05-15 (×6): qty 1

## 2019-05-15 MED ORDER — SODIUM CHLORIDE 0.9 % IV SOLN
250.0000 mL | INTRAVENOUS | Status: DC | PRN
  Administered 2019-05-18: 250 mL via INTRAVENOUS

## 2019-05-15 MED ORDER — SODIUM CHLORIDE 0.9% FLUSH
3.0000 mL | Freq: Two times a day (BID) | INTRAVENOUS | Status: DC
  Administered 2019-05-15 – 2019-05-21 (×11): 3 mL via INTRAVENOUS

## 2019-05-15 MED ORDER — ORAL CARE MOUTH RINSE
15.0000 mL | Freq: Two times a day (BID) | OROMUCOSAL | Status: DC
  Administered 2019-05-15 – 2019-05-21 (×11): 15 mL via OROMUCOSAL

## 2019-05-15 NOTE — ED Notes (Signed)
Unsuccessful 2nd IV attempt

## 2019-05-15 NOTE — ED Notes (Signed)
Bed: WA06 Expected date:  Expected time:  Means of arrival:  Comments: EMS [facility]

## 2019-05-15 NOTE — Progress Notes (Signed)
Acknowledged rx for bipap.  Pt seen along with RN, resting comfortably, HR85, rr24, spo2 95% on 4lnc.  No increased wob/respiratory distress noted or voiced by pt at this time.  Bipap not indicated at this time.  RT will monitor and assess pt as needed.

## 2019-05-15 NOTE — ED Notes (Signed)
ED TO INPATIENT HANDOFF REPORT  Name/Age/Gender Andre [name] 85 y.o. male  Code Status Code Status History    Date Active Date Inactive Code Status Order ID Comments User Context   04/04/2019 0215 04/22/2019 1800 Full Code [id number]  Andre [name] Inpatient   02/23/2019 1228 03/12/2019 1726 Full Code [id number]  [name], PA-C Inpatient      Home/SNF/Other Nursing Home  Chief Complaint [name]  Level of Care/Admitting Diagnosis ED Disposition    ED Disposition Condition [place]: [facility] [100102]  Level of Care: Stepdown [14]  Admit to SDU based on following criteria: Respiratory Distress:  Frequent assessment and/or intervention to maintain adequate ventilation/respiration, pulmonary toilet, and respiratory treatment.  Covid Evaluation: N/A  Diagnosis: Acute exacerbation of CHF (congestive heart failure) [facility]) [[id number]]  Admitting Physician: [name] [[id number]]  Attending Physician: [name] [[id number]]  Estimated length of stay: 3 - 4 days  Certification:: I certify this patient will need inpatient services for at least 2 midnights  PT Class (Do Not Modify): Inpatient [101]  PT Acc Code (Do Not Modify): Private [1]       Medical History Past Medical History:  Diagnosis Date  . Acute on chronic respiratory failure with hypoxia ([place])   . Adenomatous colon polyp 04/1985   no further colonoscopy, 2008-last colonoscopy, no polyps    . Atrial fibrillation ([place])   . Chronic atrial fibrillation   . Chronic diastolic heart failure ([place])   . Diverticulosis   . History of skin cancer    dermatology every 6 months Dr. [name]  . Hyperlipidemia   . Hypertension   . Lobar pneumonia, unspecified organism ([place])   . Macular degeneration    bilateral  . Nephrolithiasis   . PAF (paroxysmal atrial fibrillation) ([place])   . PAT (paroxysmal atrial tachycardia) ([facility])    many years ago, worse with smoking  .  Severe sepsis ([place])     Allergies No Known Allergies  IV Location/Drains/Wounds Patient Lines/Drains/Airways Status   Active Line/Drains/Airways    Name:   Placement date:   Placement time:   Site:   Days:   Peripheral IV 05/15/19 Left Antecubital   05/15/19    1622    Antecubital   less than 1   Incision (Closed) 02/24/19 Chest Left;Anterior   02/24/19    1030     80          Labs/Imaging Results for orders placed or performed during the hospital encounter of 05/15/19 (from the past 48 hour(s))  SARS Coronavirus 2 (CEPHEID- Performed in [facility] hospital lab), Hosp Order     Status: None   Collection Time: 05/15/19  4:25 PM  Result Value Ref Range   SARS Coronavirus 2 NEGATIVE NEGATIVE    Comment: (NOTE) If result is NEGATIVE SARS-CoV-2 target nucleic acids are NOT DETECTED. The SARS-CoV-2 RNA is generally detectable in upper and lower  respiratory specimens during the acute phase of infection. The lowest  concentration of SARS-CoV-2 viral copies this assay can detect is 250  copies / mL. A negative result does not preclude SARS-CoV-2 infection  and should not be used as the sole basis for treatment or other  patient management decisions.  A negative result may occur with  improper specimen collection / handling, submission of specimen other  than nasopharyngeal swab, presence of viral mutation(s) within the  areas targeted by this assay, and inadequate number of viral copies  (<250  copies / mL). A negative result must be combined with clinical  observations, patient history, and epidemiological information. If result is POSITIVE SARS-CoV-2 target nucleic acids are DETECTED. The SARS-CoV-2 RNA is generally detectable in upper and lower  respiratory specimens dur ing the acute phase of infection.  Positive  results are indicative of active infection with SARS-CoV-2.  Clinical  correlation with patient history and other diagnostic information is  necessary to determine  patient infection status.  Positive results do  not rule out bacterial infection or co-infection with other viruses. If result is PRESUMPTIVE POSTIVE SARS-CoV-2 nucleic acids MAY BE PRESENT.   A presumptive positive result was obtained on the submitted specimen  and confirmed on repeat testing.  While 2019 novel coronavirus  (SARS-CoV-2) nucleic acids may be present in the submitted sample  additional confirmatory testing may be necessary for epidemiological  and / or clinical management purposes  to differentiate between  SARS-CoV-2 and other Sarbecovirus currently known to infect humans.  If clinically indicated additional testing with an alternate test  methodology [phone]) is advised. The SARS-CoV-2 RNA is generally  detectable in upper and lower respiratory sp ecimens during the acute  phase of infection. The expected result is Negative. Fact Sheet for Patients:  StrictlyIdeas.no Fact Sheet for Healthcare Providers: BankingDealers.co.za This test is not yet approved or cleared by the [place] FDA and has been authorized for detection and/or diagnosis of SARS-CoV-2 by FDA under an Emergency Use Authorization (EUA).  This EUA will remain in effect (meaning this test can be used) for the duration of the COVID-19 declaration under Section 564(b)(1) of the Act, 21 U.S.C. section 360bbb-3(b)(1), unless the authorization is terminated or revoked sooner. Performed at [facility], [place] [address] [place], [place] [address]   CBC with Differential     Status: Abnormal   Collection Time: 05/15/19  4:26 PM  Result Value Ref Range   WBC 10.7 (H) 4.0 - 10.5 K/uL   RBC 2.98 (L) 4.22 - 5.81 MIL/uL   Hemoglobin 8.0 (L) 13.0 - 17.0 g/dL   HCT 26.6 (L) 39.0 - 52.0 %   MCV 89.3 80.0 - 100.0 fL   MCH 26.8 26.0 - 34.0 pg   MCHC 30.1 30.0 - 36.0 g/dL   RDW 16.7 (H) 11.5 - 15.5 %   Platelets 382 150 - 400 K/uL   nRBC 0.0  0.0 - 0.2 %   Neutrophils Relative % 68 %   Neutro Abs 7.4 1.7 - 7.7 K/uL   Lymphocytes Relative 21 %   Lymphs Abs 2.3 0.7 - 4.0 K/uL   Monocytes Relative 7 %   Monocytes Absolute 0.7 0.1 - 1.0 K/uL   Eosinophils Relative 2 %   Eosinophils Absolute 0.2 0.0 - 0.5 K/uL   Basophils Relative 1 %   Basophils Absolute 0.1 0.0 - 0.1 K/uL   Immature Granulocytes 1 %   Abs Immature Granulocytes 0.07 0.00 - 0.07 K/uL    Comment: Performed at [facility], [place] [address], [place], [place] [address]  Basic metabolic panel     Status: Abnormal   Collection Time: 05/15/19  4:26 PM  Result Value Ref Range   Sodium 137 135 - 145 mmol/L   Potassium 4.0 3.5 - 5.1 mmol/L   Chloride 102 98 - 111 mmol/L   CO2 26 22 - 32 mmol/L   Glucose, Bld 138 (H) 70 - 99 mg/dL   BUN 21 8 - 23 mg/dL   Creatinine, Ser  1.63 (H) 0.61 - 1.24 mg/dL   Calcium 8.2 (L) 8.9 - 10.3 mg/dL   GFR calc non Af Amer 36 (L) >60 mL/min   GFR calc Af Amer 41 (L) >60 mL/min   Anion gap 9 5 - 15    Comment: Performed at [facility], [place] [address], [place], [place] [address]  Brain natriuretic peptide     Status: Abnormal   Collection Time: 05/15/19  4:30 PM  Result Value Ref Range   B Natriuretic Peptide 1,112.4 (H) 0.0 - 100.0 pg/mL    Comment: Performed at [facility], [place] [address] [place], [place] [address]  Blood gas, venous     Status: Abnormal   Collection Time: 05/15/19  4:38 PM  Result Value Ref Range   pH, Ven 7.380 7.250 - 7.430   pCO2, Ven 48.4 44.0 - 60.0 mmHg   pO2, Ven 39.9 32.0 - 45.0 mmHg   Bicarbonate 28.0 20.0 - 28.0 mmol/L   Acid-Base Excess 2.9 (H) 0.0 - 2.0 mmol/L   O2 Saturation 67.8 %   Patient temperature 98.6    Collection site VENOUS    Drawn by DRAWN BY RN    Sample type VENOUS     Comment: Performed at [facility], [place] [address] [place], [place] [address]  Lactic acid, plasma     Status: None   Collection Time:  05/15/19  5:15 PM  Result Value Ref Range   Lactic Acid, Venous 1.2 0.5 - 1.9 mmol/L    Comment: Performed at [facility], [place] [address], [place], [place] [address]  Troponin I - Add-On to previous collection     Status: Abnormal   Collection Time: 05/15/19  6:55 PM  Result Value Ref Range   Troponin I 0.04 (HH) <0.03 ng/mL    Comment: CRITICAL RESULT CALLED TO, READ BACK BY AND VERIFIED WITH: REBECCA GROVES,RN [id number] @ [place] Performed at [place] [address], [place], [place] [address]   Hepatic function panel     Status: Abnormal   Collection Time: 05/15/19  8:46 PM  Result Value Ref Range   Total Protein 6.3 (L) 6.5 - 8.1 g/dL   Albumin 2.2 (L) 3.5 - 5.0 g/dL   AST 16 15 - 41 U/L   ALT 19 0 - 44 U/L   Alkaline Phosphatase 64 38 - 126 U/L   Total Bilirubin 0.2 (L) 0.3 - 1.2 mg/dL   Bilirubin, Direct 0.1 0.0 - 0.2 mg/dL   Indirect Bilirubin 0.1 (L) 0.3 - 0.9 mg/dL    Comment: Performed at [facility], [place] [address] [place], [place] [address]  Troponin I - Now Then Q6H     Status: Abnormal   Collection Time: 05/15/19  8:46 PM  Result Value Ref Range   Troponin I 0.03 (HH) <0.03 ng/mL    Comment: CRITICAL VALUE NOTED.  VALUE IS CONSISTENT WITH PREVIOUSLY REPORTED AND CALLED VALUE. Performed at [facility], [place] [address] [place], [place] [address]   Procalcitonin - Baseline     Status: None   Collection Time: 05/15/19  8:46 PM  Result Value Ref Range   Procalcitonin 0.36 ng/mL    Comment:        Interpretation: PCT (Procalcitonin) <= 0.5 ng/mL: Systemic infection (sepsis) is not likely. Local bacterial infection is possible. (NOTE)       Sepsis PCT Algorithm  Lower Respiratory Tract                                      Infection PCT Algorithm    ----------------------------     ----------------------------         PCT < 0.25 ng/mL                PCT < 0.10 ng/mL          Strongly encourage             Strongly discourage   discontinuation of antibiotics    initiation of antibiotics    ----------------------------     -----------------------------       PCT 0.25 - 0.50 ng/mL            PCT 0.10 - 0.25 ng/mL               OR       >80% decrease in PCT            Discourage initiation of                                            antibiotics      Encourage discontinuation           of antibiotics    ----------------------------     -----------------------------         PCT >= 0.50 ng/mL              PCT 0.26 - 0.50 ng/mL               AND        <80% decrease in PCT             Encourage initiation of                                             antibiotics       Encourage continuation           of antibiotics    ----------------------------     -----------------------------        PCT >= 0.50 ng/mL                  PCT > 0.50 ng/mL               AND         increase in PCT                  Strongly encourage                                      initiation of antibiotics    Strongly encourage escalation           of antibiotics                                     -----------------------------  PCT <= 0.25 ng/mL                                                 OR                                        > 80% decrease in PCT                                     Discontinue / Do not initiate                                             antibiotics Performed at [facility] [address] [place], [place] [address]    Dg Chest Port 1 View  Result Date: 05/15/2019 CLINICAL DATA:  Dyspnea with exertion. EXAM: PORTABLE CHEST 1 VIEW COMPARISON:  05/01/2019 FINDINGS: The heart is enlarged. BILATERAL pulmonary opacities with effusions most consistent with pulmonary edema. Unchanged dual lead pacer. Worsening aeration. IMPRESSION: Worsening aeration. BILATERAL pulmonary opacities with effusions  consistent with pulmonary edema. Electronically Signed   By: Staci Righter M.D.   On: 05/15/2019 18:15    Pending Labs Unresulted Labs (From admission, onward)    Start     Ordered   05/15/19 2046  Troponin I - Now Then Q6H  Now then every 6 hours,   R     05/15/19 2045   05/15/19 1628  Lactic acid, plasma  Now then every 2 hours,   STAT     05/15/19 1628   05/15/19 1628  Culture, blood (routine x 2)  BLOOD CULTURE X 2,   STAT    Question:  Patient immune status  Answer:  Normal   05/15/19 1628   05/15/19 1627  Urinalysis, Routine w reflex microscopic  ONCE - STAT,   STAT     05/15/19 1628          Vitals/Pain Today's Vitals   05/15/19 2030 05/15/19 2100 05/15/19 2130 05/15/19 2140  BP: (!) 145/62 (!) 155/70  (!) 149/56  Pulse: 81 94 85 82  Resp: (!) 23 (!) 32 (!) 25 (!) 26  SpO2: 98% 95% 100% 100%    Isolation Precautions No active isolations  Medications Medications  furosemide (LASIX) injection 40 mg (40 mg Intravenous Given 05/15/19 2007)    Mobility walks with device

## 2019-05-15 NOTE — ED Provider Notes (Signed)
[facility] DEPT Provider Note   CSN: [id number] Arrival date & time: 05/15/19  1550    History   Chief Complaint Chief Complaint  Patient presents with  . Shortness of Breath    HPI CHIMAOBI CASEBOLT is a 85 y.o. male.     HPI   He presents for evaluation of shortness of breath, with abnormal chest x-ray, done today showing extensive bilateral infiltrates.  He states last night he was having trouble breathing.  On arrival his oxygen saturation 91% on room air.    Past Medical History:  Diagnosis Date  . Acute on chronic respiratory failure with hypoxia ([place])   . Adenomatous colon polyp 04/1985   no further colonoscopy, 2008-last colonoscopy, no polyps    . Atrial fibrillation ([place])   . Chronic atrial fibrillation   . Chronic diastolic heart failure ([place])   . Diverticulosis   . History of skin cancer    dermatology every 6 months Dr. [name]  . Hyperlipidemia   . Hypertension   . Lobar pneumonia, unspecified organism ([place])   . Macular degeneration    bilateral  . Nephrolithiasis   . PAF (paroxysmal atrial fibrillation) ([place])   . PAT (paroxysmal atrial tachycardia) ([facility])    many years ago, worse with smoking  . Severe sepsis [facility])     Patient Active Problem List   Diagnosis Date Noted  . Acute on chronic respiratory failure with hypoxia ([place])   . Severe sepsis ([place])   . Chronic diastolic heart failure ([place])   . Lobar pneumonia, unspecified organism ([place])   . Chronic atrial fibrillation   . Pacemaker 03/12/2019  . Hypoxia 03/12/2019  . Pressure injury of skin 02/27/2019  . CHB (complete heart block) ([place]) 02/23/2019  . Peripheral vascular disease ([place]) 04/21/2016  . Atrial fibrillation/flutter 03/17/2016  . Acute on chronic diastolic heart failure ([place]) 01/05/2016  . Macular degeneration 02/17/2015  . Acute-on-chronic kidney disease stage III 02/17/2015  . Former smoker 02/17/2015  . History of skin cancer   . Malignant  neoplasm of prostate ([place]) 01/05/2010  . Hyperlipidemia 12/04/2007  . Essential hypertension 12/04/2007  . Angiodysplasia of intestine with hemorrhage 05/15/2007    Past Surgical History:  Procedure Laterality Date  . CARDIOVERSION N/A 05/23/2016   Procedure: CARDIOVERSION;  Surgeon: [name], MD;  Location: [facility] ENDOSCOPY;  Service: Cardiovascular;  Laterality: N/A;  . CARDIOVERSION N/A 03/01/2019   Procedure: CARDIOVERSION;  Surgeon: Lelon Perla, MD;  Location: [facility] ENDOSCOPY;  Service: Cardiovascular;  Laterality: N/A;  . CATARACT EXTRACTION     bilateral  . INGUINAL HERNIA REPAIR    . PACEMAKER IMPLANT N/A 02/24/2019   Procedure: PACEMAKER IMPLANT;  Surgeon: Deboraha Sprang, MD;  Location: [place] CV LAB;  Service: Cardiovascular;  Laterality: N/A;  . TEE WITHOUT CARDIOVERSION N/A 03/22/2016   Procedure: TRANSESOPHAGEAL ECHOCARDIOGRAM (TEE);  Surgeon: [name], MD;  Location: [place];  Service: Cardiovascular;  Laterality: N/A;  . TEE WITHOUT CARDIOVERSION N/A 05/23/2016   Procedure: TRANSESOPHAGEAL ECHOCARDIOGRAM (TEE);  Surgeon: [name], MD;  Location: [facility] ENDOSCOPY;  Service: Cardiovascular;  Laterality: N/A;  . TEE WITHOUT CARDIOVERSION N/A 03/01/2019   Procedure: TRANSESOPHAGEAL ECHOCARDIOGRAM (TEE);  Surgeon: Lelon Perla, MD;  Location: [facility] ENDOSCOPY;  Service: Cardiovascular;  Laterality: N/A;  . TENDON REPAIR  12/11   right leg        Home Medications    Prior to Admission medications   Medication Sig Start Date End Date  Taking? Authorizing Provider  albuterol (VENTOLIN HFA) 108 (90 Base) MCG/ACT inhaler Inhale 1 puff into the lungs every 4 (four) hours as needed for wheezing or shortness of breath.   Yes [provider]  Amino Acids-Protein Hydrolys (FEEDING SUPPLEMENT, PRO-STAT SUGAR FREE 64,) LIQD Take 30 mLs by mouth 3 (three) times daily with meals.   Yes [provider]  amiodarone (PACERONE) 200 MG tablet Take 1  tablet (200 mg total) by mouth daily. 03/19/19  Yes Sande Rives E, PA-C  amLODipine (NORVASC) 5 MG tablet Take 5 mg by mouth daily.   Yes [provider]  apixaban (ELIQUIS) 2.5 MG TABS tablet Take 2.5 mg by mouth 2 (two) times daily.   Yes [provider]  atorvastatin (LIPITOR) 10 MG tablet Take 1 tablet (10 mg total) by mouth daily. 09/03/18  Yes [name], MD  B Complex-C-Folic Acid (NEPHRO-VITE PO) Take 0.8 mg by mouth daily.   Yes [provider]  docusate sodium (COLACE) 100 MG capsule Take 100 mg by mouth 2 (two) times daily.   Yes [provider]  famotidine (PEPCID) 20 MG tablet Take 20 mg by mouth daily.   Yes [provider]  ipratropium-albuterol (DUONEB) 0.5-2.5 (3) MG/3ML SOLN Take 3 mLs by nebulization every 4 (four) hours as needed (shortness of breath).   Yes [provider]  Melatonin 3 MG TABS Take 6 mg by mouth at bedtime.   Yes [provider]  mirtazapine (REMERON) 7.5 MG tablet Take 7.5 mg by mouth at bedtime.   Yes [provider]  polyethylene glycol (MIRALAX / GLYCOLAX) 17 g packet Take 17 g by mouth 2 (two) times daily.   Yes [provider]  amiodarone (PACERONE) 200 MG tablet Take 1 tablet (200 mg total) by mouth 2 (two) times daily. Take 200mg  twice daily for 1 week then decrease to 200mg  daily. Patient not taking: Reported on 05/15/2019 03/12/19   Darreld Mclean, PA-C  amLODipine (NORVASC) 10 MG tablet Take 1 tablet (10 mg total) by mouth daily. Patient not taking: Reported on 05/15/2019 09/03/18   [name], MD  isosorbide mononitrate (IMDUR) 120 MG 24 hr tablet Take 1 tablet (120 mg total) by mouth daily. Patient not taking: Reported on 05/15/2019 03/13/19   Darreld Mclean, PA-C  levalbuterol Penne Lash) 0.63 MG/3ML nebulizer solution Take 3 mLs (0.63 mg total) by nebulization 2 (two) times daily. Patient not taking: Reported on 05/15/2019 03/12/19   Darreld Mclean, PA-C   Rivaroxaban (XARELTO) 15 MG TABS tablet Take 1 tablet (15 mg total) by mouth daily with supper. Patient not taking: Reported on 05/15/2019 09/03/18   [name], MD    Family History Family History  Problem Relation Age of Onset  . Hypertension Mother   . Cancer Father        lung    Social History Social History   Tobacco Use  . Smoking status: Former Smoker    Packs/day: 1.00    Years: 60.00    Pack years: 60.00    Types: Cigarettes    Last attempt to quit: 12/13/2003    Years since quitting: 15.4  . Smokeless tobacco: Never Used  Substance Use Topics  . Alcohol use: Yes    Alcohol/week: 0.0 standard drinks    Comment: 2 glass of wine a day  . Drug use: No     Allergies   Patient has no known allergies.   Review of Systems Review of  Systems   Physical Exam Updated Vital Signs BP (!) 145/62   Pulse 81   Resp (!) 23   SpO2 98%   Physical Exam Vitals signs and nursing note reviewed.  Constitutional:      Appearance: He is well-developed.  HENT:     Head: Normocephalic and atraumatic.     Right Ear: External ear normal.     Left Ear: External ear normal.  Eyes:     Conjunctiva/sclera: Conjunctivae normal.     Pupils: Pupils are equal, round, and reactive to light.  Neck:     Musculoskeletal: Normal range of motion and neck supple.     Trachea: Phonation normal.  Cardiovascular:     Rate and Rhythm: Normal rate and regular rhythm.     Heart sounds: Normal heart sounds. No murmur.     Comments: No JVD Pulmonary:     Effort: Pulmonary effort is normal. No respiratory distress.     Breath sounds: Normal breath sounds. No stridor.  Abdominal:     General: There is no distension.     Palpations: Abdomen is soft.     Tenderness: There is no abdominal tenderness.  Musculoskeletal: Normal range of motion.     Right lower leg: No edema.     Left lower leg: No edema.     Comments: Edema of arms, bilaterally.  Skin:    General: Skin is warm and dry.   Neurological:     Mental Status: He is alert and oriented to person, place, and time.     Cranial Nerves: No cranial nerve deficit.     Sensory: No sensory deficit.     Motor: No abnormal muscle tone.     Coordination: Coordination normal.  Psychiatric:        Mood and Affect: Mood normal.        Behavior: Behavior normal.        Thought Content: Thought content normal.        Judgment: Judgment normal.      ED Treatments / Results  Labs (all labs ordered are listed, but only abnormal results are displayed) Labs Reviewed  CBC WITH DIFFERENTIAL/PLATELET - Abnormal; Notable for the following components:      Result Value   WBC 10.7 (*)    RBC 2.98 (*)    Hemoglobin 8.0 (*)    HCT 26.6 (*)    RDW 16.7 (*)    All other components within normal limits  BASIC METABOLIC PANEL - Abnormal; Notable for the following components:   Glucose, Bld 138 (*)    Creatinine, Ser 1.63 (*)    Calcium 8.2 (*)    GFR calc non Af Amer 36 (*)    GFR calc Af Amer 41 (*)    All other components within normal limits  BLOOD GAS, VENOUS - Abnormal; Notable for the following components:   Acid-Base Excess 2.9 (*)    All other components within normal limits  BRAIN NATRIURETIC PEPTIDE - Abnormal; Notable for the following components:   B Natriuretic Peptide 1,112.4 (*)    All other components within normal limits  TROPONIN I - Abnormal; Notable for the following components:   Troponin I 0.04 (*)    All other components within normal limits  SARS CORONAVIRUS 2 (HOSPITAL ORDER, [place] LAB)  CULTURE, BLOOD (ROUTINE X 2)  CULTURE, BLOOD (ROUTINE X 2)  LACTIC ACID, PLASMA  URINALYSIS, ROUTINE W REFLEX MICROSCOPIC  LACTIC ACID, PLASMA  HEPATIC  FUNCTION PANEL    EKG EKG Interpretation  Date/Time:  Wednesday May 15 2019 16:31:36 EDT Ventricular Rate:  86 PR Interval:    QRS Duration: 172 QT Interval:  419 QTC Calculation: 502 R Axis:   -75 Text Interpretation:   Ventricular-paced rhythm No further analysis attempted due to paced rhythm Baseline wander in lead(s) V6 since last tracing no significant change Confirmed by Daleen Bo [phone]) on 05/15/2019 8:07:48 PM   Radiology Dg Chest Port 1 View  Result Date: 05/15/2019 CLINICAL DATA:  Dyspnea with exertion. EXAM: PORTABLE CHEST 1 VIEW COMPARISON:  05/01/2019 FINDINGS: The heart is enlarged. BILATERAL pulmonary opacities with effusions most consistent with pulmonary edema. Unchanged dual lead pacer. Worsening aeration. IMPRESSION: Worsening aeration. BILATERAL pulmonary opacities with effusions consistent with pulmonary edema. Electronically Signed   By: Staci Righter M.D.   On: 05/15/2019 18:15    Procedures .Critical Care Performed by: Daleen Bo, MD Authorized by: Daleen Bo, MD   Critical care provider statement:    Critical care time (minutes):  35   Critical care start time:  05/15/2019 4:10 PM   Critical care end time:  05/15/2019 8:09 PM   Critical care time was exclusive of:  Separately billable procedures and treating other patients   Critical care was necessary to treat or prevent imminent or life-threatening deterioration of the following conditions:  Respiratory failure and circulatory failure   Critical care was time spent personally by me on the following activities:  Blood draw for specimens, development of treatment plan with patient or surrogate, discussions with consultants, evaluation of patient's response to treatment, examination of patient, obtaining history from patient or surrogate, ordering and performing treatments and interventions, ordering and review of laboratory studies, pulse oximetry, re-evaluation of patient's condition, review of old charts and ordering and review of radiographic studies   (including critical care time)  Medications Ordered in ED Medications  furosemide (LASIX) injection 40 mg (40 mg Intravenous Given 05/15/19 2007)     Initial Impression /  Assessment and Plan / ED Course  I have reviewed the triage vital signs and the nursing notes.  Pertinent labs & imaging results that were available during my care of the patient were reviewed by me and considered in my medical decision making (see chart for details).  Clinical Course as of May 15 2039  Wed May 15, 2019  1832 Normal  SARS Coronavirus 2 (CEPHEID- Performed in [facility] hospital lab), [facility] Order [EW]  [phone] Normal  Lactic acid, plasma [EW]  1833 Normal except hemoglobin low, white count high  CBC with Differential(!) [EW]  1833 Normal except glucose high, creatinine high, calcium low, GFR low  Basic metabolic panel(!) [EW]  0865 Normal except acid-base high   [EW]  1833 Consistent with pulmonary edema, with small effusions.  Image reviewed by me  DG Chest [facility] [EW]  2001 Mild elevation  Troponin I - Add-On to previous collection(!!) [EW]  2002 Normal except white count high, hemoglobin low  CBC with Differential(!) [EW]  2002 Normal  Lactic acid, plasma [EW]  2002 Elevated  Brain natriuretic peptide(!) [EW]  2002 Normal, except glucose high, creatinine high, calcium low, GFR low  Basic metabolic panel(!) [EW]  7846 Normal  SARS Coronavirus 2 (CEPHEID- Performed in [place] hospital lab), Hosp Order [EW]    Clinical Course User Index [EW] Daleen Bo, MD        Patient Vitals for the past 24 hrs:  BP Pulse Resp SpO2  05/15/19 2030 (!) 145/62 81 (!) 23 98 %  05/15/19 2000 (!) 148/58 92 (!) 27 97 %  05/15/19 1930 (!) 153/59 86 (!) 23 97 %  05/15/19 1900 (!) 146/63 94 18 95 %  05/15/19 1830 (!) 148/60 83 (!) 22 99 %  05/15/19 1800 (!) 148/63 80 (!) 25 100 %  05/15/19 1730 124/61 81 19 100 %  05/15/19 1602 - - - 92 %    8:40 PM Reevaluation with update and discussion. After initial assessment and treatment, an updated evaluation reveals he is comfortable at this time and has no further complaints.  Findings discussed and questions answered.   Have also discussed this case with his son Lennette Bihari, by phone. Daleen Bo   Medical Decision Making: He presents for acute shortness of breath with increased oxygen requirement, with new onset acute congestive heart failure.  He is currently in rehab following long-term hospitalization in a LTAC.  During that time he was treated for heart block, with a pacemaker, had ventilation with endotracheal tube due to cute respiratory failure, pneumonia and pulmonary edema.  He has required high flow oxygen in the past.  He arrived in the rehab facility, about 2 weeks ago.  He has been referred to pulmonary regarding abnormal cardiac fat pad.  It is anticipated he will see them after he leaves rehab.  He is a CPAP at night.  He has chronic indwelling Foley for urinary retention.  He is on Eliquis for atrial fibrillation.  He had a chest x-ray done, 2 days ago and it was repeated today because of progressive symptoms.  He apparently had an increase of his Lasix dose, recently and 2 days ago creatinine was 1.69.  He has chronic anemia, range of 8.  PSA recently elevated 11.7.  He is apparently maintained on oxygen at 6 L per nasal cannula.  Usual Lasix dose is 40 mg daily.  Cardiac echo done March 2020, with normal systolic ejection fraction.   AUDI WETTSTEIN was evaluated in Emergency Department on 05/15/2019 for the symptoms described in the history of present illness. He was evaluated in the context of the global COVID-19 pandemic, which necessitated consideration that the patient might be at risk for infection with the SARS-CoV-2 virus that causes COVID-19. Institutional protocols and algorithms that pertain to the evaluation of patients at risk for COVID-19 are in a state of rapid change based on information released by regulatory bodies including the CDC and federal and state organizations. These policies and algorithms were followed during the patient's care in the ED.   CRITICAL CARE-yes Performed by: Daleen Bo  Nursing Notes Reviewed/ Care Coordinated Applicable Imaging Reviewed Interpretation of Laboratory Data incorporated into ED treatment  8:40 PM-Consult complete with hospitalist. Patient case explained and discussed. He agrees to admit patient for further evaluation and treatment. Call ended at 8:41 PM  Plan: Admit  Final Clinical Impressions(s) / ED Diagnoses   Final diagnoses:  Acute congestive heart failure, unspecified heart failure type ([place])  Anemia, unspecified type    ED Discharge Orders    None       Daleen Bo, MD 05/15/19 2044

## 2019-05-15 NOTE — H&P (Signed)
Andre [name] UUV:[id number] DOB: 08/16/1923 DOA: 05/15/2019     PCP: Rosaria Ferries, MD   Outpatient Specialists:   CARDS:   Manuela Neptune, MD    Patient arrived to ER on 05/15/19 at 1550  Patient coming from:  From facility    Chief Complaint:  Chief Complaint  Patient presents with  . Shortness of Breath    HPI: Andre [name] is a 85 y.o. male with medical history significant of complete heart block, HLD, HTN, CKD stage III, chronic diastolic CHF, atrial fibrillation, pacemaker, reticulosis kidney stones.    Presented with worsening hypoxia  Per review of records patient supposed to be on 4 L oxygen although he stating that that has been trying to titrated up to 9. And was admitted in April with CHF/ARDS requiring intubation Initially on arrival at that time patient was satting 50% but came up to 70 on CPAP.ARDS bilateral pneumonia sepsis with shock He was treated with Zosyn and meropenem Oxygen oxygen saturation down to 70s He spent prolonged time at rehab facility today was noted to have oxygen saturation down to 70% worsening dyspnea    Infectious risk factors:  Reports  shortness of breath,   In RAPID COVID TEST NEGATIVE     Regarding pertinent Chronic problems:   Hx of pacemaker Dual pacemaker ([employer]tronic MRI compatible device) implantation for complete heart block.  Hyperlipidemia -  on lipitor   HTN on imdur, norvasc   CHF diastolic  - last echo 4/74/2595  normal systolic function with an ejection fraction of 63-87 diastolic Doppler parameters are indeterminate.      A. Fib -  - CHA2DS2 vas score   :  current  on anticoagulation with     Eliquis            -  Rate control:  Currently controlled         - Rhythm control:   Amiodarone,   CKD stage III - baseline Cr 1.2   While in ER:  The following Work up has been ordered so far:  Orders Placed This Encounter  Procedures  . Critical Care  . SARS Coronavirus 2 (CEPHEID-  Performed in [place] hospital lab), [facility]  . Culture, blood (routine x 2)  . Urine Culture  . MRSA PCR Screening  . DG Chest Port 1 View  . CBC with Differential  . Basic metabolic panel  . Urinalysis, Routine w reflex microscopic  . Lactic acid, plasma  . Blood gas, venous  . Brain natriuretic peptide  . Troponin I - Add-On to previous collection  . Hepatic function panel  . Troponin I - Now Then Q6H  . Procalcitonin - Baseline  . Cardiac monitoring  . Swab Process:  . Considerations:  . If MRSA PCR Screen is Positive:  . Consult to hospitalist  ALL PATIENTS BEING ADMITTED/HAVING PROCEDURES NEED COVID-19 SCREENING  . Consult to case management  . Social work consult  . Bipap  . ED EKG  . EKG 12-Lead  . Type and screen [place]  . Admit to Inpatient (patient's expected length of stay will be greater than 2 midnights or inpatient only procedure)     Following Medications were ordered in ER: Medications  furosemide (LASIX) injection 40 mg (40 mg Intravenous Given 05/15/19 2007)        Consult Orders  (From admission, onward)         Start     Ordered  05/15/19 2221  Social work consult  Once    Provider:  (Not yet assigned)  Question:  Reason for Consult?  Answer:  ADMITTED FROM FACILITY   05/15/19 2221   05/15/19 2218  Consult to case management  Once    Comments:  Heart failure home health screen and may place order for PT/OT eval and treat if indicated.  Provider:  (Not yet assigned)  Question:  Reason for consult:  Answer:  Other (see comments)   05/15/19 2221          Significant initial  Findings: Abnormal Labs Reviewed  CBC WITH DIFFERENTIAL/PLATELET - Abnormal; Notable for the following components:      Result Value   WBC 10.7 (*)    RBC 2.98 (*)    Hemoglobin 8.0 (*)    HCT 26.6 (*)    RDW 16.7 (*)    All other components within normal limits  BASIC METABOLIC PANEL - Abnormal; Notable for the following components:    Glucose, Bld 138 (*)    Creatinine, Ser 1.63 (*)    Calcium 8.2 (*)    GFR calc non Af Amer 36 (*)    GFR calc Af Amer 41 (*)    All other components within normal limits  URINALYSIS, ROUTINE W REFLEX MICROSCOPIC - Abnormal; Notable for the following components:   Color, Urine STRAW (*)    APPearance HAZY (*)    Leukocytes,Ua MODERATE (*)    Bacteria, UA RARE (*)    All other components within normal limits  BLOOD GAS, VENOUS - Abnormal; Notable for the following components:   Acid-Base Excess 2.9 (*)    All other components within normal limits  BRAIN NATRIURETIC PEPTIDE - Abnormal; Notable for the following components:   B Natriuretic Peptide 1,112.4 (*)    All other components within normal limits  TROPONIN I - Abnormal; Notable for the following components:   Troponin I 0.04 (*)    All other components within normal limits  HEPATIC FUNCTION PANEL - Abnormal; Notable for the following components:   Total Protein 6.3 (*)    Albumin 2.2 (*)    Total Bilirubin 0.2 (*)    Indirect Bilirubin 0.1 (*)    All other components within normal limits  TROPONIN I - Abnormal; Notable for the following components:   Troponin I 0.03 (*)    All other components within normal limits     Otherwise labs showing:    Recent Labs  Lab 05/15/19 1626  NA 137  K 4.0  CO2 26  GLUCOSE 138*  BUN 21  CREATININE 1.63*  CALCIUM 8.2*    Cr     Up from baseline see below Lab Results  Component Value Date   CREATININE 1.63 (H) 05/15/2019   CREATININE 1.25 (H) 04/20/2019   CREATININE 1.37 (H) 04/18/2019    Recent Labs  Lab 05/15/19 2046  AST 16  ALT 19  ALKPHOS 64  BILITOT 0.2*  PROT 6.3*  ALBUMIN 2.2*   Lab Results  Component Value Date   CALCIUM 8.2 (L) 05/15/2019   PHOS 3.9 04/13/2019   WBC      Component Value Date/Time   WBC 10.7 (H) 05/15/2019 1626   ANC    Component Value Date/Time   NEUTROABS 7.4 05/15/2019 1626      Plt: Lab Results  Component Value Date    PLT 382 05/15/2019     Lactic Acid, Venous    Component Value Date/Time   LATICACIDVEN  1.2 05/15/2019 1715    Procalcitonin 0.36   COVID-19 Labs     Lab Results  Component Value Date   [place] NEGATIVE 05/15/2019     VBG  7.380/48.4 HG/HCT stable,  From last month it was 8.8    Component Value Date/Time   HGB 8.0 (L) 05/15/2019 1626   HGB 13.7 07/17/2018 1157   HCT 26.6 (L) 05/15/2019 1626   HCT 39.4 07/17/2018 1157    Troponin  Cardiac Panel (last 3 results) Recent Labs    05/15/19 1855 05/15/19 2046  TROPONINI 0.04* 0.03*      BNP (last 3 results) Recent Labs    02/23/19 0833 05/15/19 1630  BNP 1,628.2* 1,112.4*      UA some pyuria but only rare bacteria Urine analysis:    Component Value Date/Time   COLORURINE STRAW (A) 05/15/2019 2150   APPEARANCEUR HAZY (A) 05/15/2019 2150   LABSPEC 1.005 05/15/2019 2150   PHURINE 6.0 05/15/2019 2150   GLUCOSEU NEGATIVE 05/15/2019 2150   HGBUR NEGATIVE 05/15/2019 2150   BILIRUBINUR NEGATIVE 05/15/2019 2150   BILIRUBINUR Negative 12/11/2018 0947   KETONESUR NEGATIVE 05/15/2019 2150   PROTEINUR NEGATIVE 05/15/2019 2150   UROBILINOGEN 0.2 12/11/2018 0947   UROBILINOGEN 0.2 12/08/2010 0639   NITRITE NEGATIVE 05/15/2019 2150   LEUKOCYTESUR MODERATE (A) 05/15/2019 2150      CXR - CHF bilateral infiltrates   ECG:  Personally reviewed by me showing: HR : 86  Rhythm:   Paced   no evidence of ischemic changes QTC 502      ED Triage Vitals  Enc Vitals Group     BP 05/15/19 1730 124/61     Pulse Rate 05/15/19 1730 81     Resp 05/15/19 1730 19     Temp --      Temp src --      SpO2 05/15/19 1602 92 %     Weight --      Height --      Head Circumference --      Peak Flow --      Pain Score --      Pain Loc --      Pain Edu? --      Excl. in [place]? --   TMAX(24)@       Latest  Blood pressure (!) 148/58, pulse 92, resp. rate (!) 27, SpO2 97 %.     Hospitalist was called for admission for  CHF exacerbation of acute respiratory failure   Review of Systems:    Pertinent positives include:   shortness of breath at rest.   dyspnea on exertion  Constitutional:  No weight loss, night sweats, Fevers, chills, fatigue, weight loss  HEENT:  No headaches, Difficulty swallowing,Tooth/dental problems,Sore throat,  No sneezing, itching, ear ache, nasal congestion, post nasal drip,  Cardio-vascular:  No chest pain, Orthopnea, PND, anasarca, dizziness, palpitations.no Bilateral lower extremity swelling  GI:  No heartburn, indigestion, abdominal pain, nausea, vomiting, diarrhea, change in bowel habits, loss of appetite, melena, blood in stool, hematemesis Resp:  no , No excess mucus, no productive cough, No non-productive cough, No coughing up of blood.No change in color of mucus.No wheezing. Skin:  no rash or lesions. No jaundice GU:  no dysuria, change in color of urine, no urgency or frequency. No straining to urinate.  No flank pain.  Musculoskeletal:  No joint pain or no joint swelling. No decreased range of motion. No back pain.  Psych:  No change in mood or  affect. No depression or anxiety. No memory loss.  Neuro: no localizing neurological complaints, no tingling, no weakness, no double vision, no gait abnormality, no slurred speech, no confusion  All systems reviewed and apart from [place] all are negative  Past Medical History:   Past Medical History:  Diagnosis Date  . Acute on chronic respiratory failure with hypoxia ([place])   . Adenomatous colon polyp 04/1985   no further colonoscopy, 2008-last colonoscopy, no polyps    . Atrial fibrillation ([place])   . Chronic atrial fibrillation   . Chronic diastolic heart failure ([place])   . Diverticulosis   . History of skin cancer    dermatology every 6 months Dr. [name]  . Hyperlipidemia   . Hypertension   . Lobar pneumonia, unspecified organism ([place])   . Macular degeneration    bilateral  . Nephrolithiasis   . PAF  (paroxysmal atrial fibrillation) ([place])   . PAT (paroxysmal atrial tachycardia) (HCC)    many years ago, worse with smoking  . Severe sepsis [facility])       Past Surgical History:  Procedure Laterality Date  . CARDIOVERSION N/A 05/23/2016   Procedure: CARDIOVERSION;  Surgeon: [name], MD;  Location[facility] ENDOSCOPY;  Service: Cardiovascular;  Laterality: N/A;  . CARDIOVERSION N/A 03/01/2019   Procedure: CARDIOVERSION;  Surgeon: Lelon Perla, MD;  Location: [facility] ENDOSCOPY;  Service: Cardiovascular;  Laterality: N/A;  . CATARACT EXTRACTION     bilateral  . INGUINAL HERNIA REPAIR    . PACEMAKER IMPLANT N/A 02/24/2019   Procedure: PACEMAKER IMPLANT;  Surgeon: Deboraha Sprang, MD;  Location: [place] CV LAB;  Service: Cardiovascular;  Laterality: N/A;  . TEE WITHOUT CARDIOVERSION N/A 03/22/2016   Procedure: TRANSESOPHAGEAL ECHOCARDIOGRAM (TEE);  Surgeon: [name], MD;  Location: [place];  Service: Cardiovascular;  Laterality: N/A;  . TEE WITHOUT CARDIOVERSION N/A 05/23/2016   Procedure: TRANSESOPHAGEAL ECHOCARDIOGRAM (TEE);  Surgeon: [name], MD;  Location: [facility] ENDOSCOPY;  Service: Cardiovascular;  Laterality: N/A;  . TEE WITHOUT CARDIOVERSION N/A 03/01/2019   Procedure: TRANSESOPHAGEAL ECHOCARDIOGRAM (TEE);  Surgeon: Lelon Perla, MD;  Location: [facility] ENDOSCOPY;  Service: Cardiovascular;  Laterality: N/A;  . TENDON REPAIR  12/11   right leg    Social History:  Ambulatory  walker          reports that he quit smoking about 15 years ago. His smoking use included cigarettes. He has a 60.00 pack-year smoking history. He has never used smokeless tobacco. He reports current alcohol use. He reports that he does not use drugs.     Family History:   Family History  Problem Relation Age of Onset  . Hypertension Mother   . Cancer Father        lung    Allergies: No Known Allergies   Prior to Admission medications   Medication Sig Start Date End Date Taking?  Authorizing Provider  albuterol (VENTOLIN HFA) 108 (90 Base) MCG/ACT inhaler Inhale 1 puff into the lungs every 4 (four) hours as needed for wheezing or shortness of breath.   Yes [provider]  Amino Acids-Protein Hydrolys (FEEDING SUPPLEMENT, PRO-STAT SUGAR FREE 64,) LIQD Take 30 mLs by mouth 3 (three) times daily with meals.   Yes [provider]  amiodarone (PACERONE) 200 MG tablet Take 1 tablet (200 mg total) by mouth daily. 03/19/19  Yes Sande Rives E, PA-C  amLODipine (NORVASC) 5 MG tablet Take 5 mg by mouth daily.   Yes [provider]  apixaban (  ELIQUIS) 2.5 MG TABS tablet Take 2.5 mg by mouth 2 (two) times daily.   Yes [provider]  atorvastatin (LIPITOR) 10 MG tablet Take 1 tablet (10 mg total) by mouth daily. 09/03/18  Yes [name], MD  B Complex-C-Folic Acid (NEPHRO-VITE PO) Take 0.8 mg by mouth daily.   Yes [provider]  docusate sodium (COLACE) 100 MG capsule Take 100 mg by mouth 2 (two) times daily.   Yes [provider]  famotidine (PEPCID) 20 MG tablet Take 20 mg by mouth daily.   Yes [provider]  ipratropium-albuterol (DUONEB) 0.5-2.5 (3) MG/3ML SOLN Take 3 mLs by nebulization every 4 (four) hours as needed (shortness of breath).   Yes [provider]  Melatonin 3 MG TABS Take 6 mg by mouth at bedtime.   Yes [provider]  mirtazapine (REMERON) 7.5 MG tablet Take 7.5 mg by mouth at bedtime.   Yes [provider]  polyethylene glycol (MIRALAX / GLYCOLAX) 17 g packet Take 17 g by mouth 2 (two) times daily.   Yes [provider]  amiodarone (PACERONE) 200 MG tablet Take 1 tablet (200 mg total) by mouth 2 (two) times daily. Take 200mg  twice daily for 1 week then decrease to 200mg  daily. Patient not taking: Reported on 05/15/2019 03/12/19   Darreld Mclean, PA-C  amLODipine (NORVASC) 10 MG tablet Take 1 tablet (10 mg total) by mouth daily. Patient not taking:  Reported on 05/15/2019 09/03/18   [name], MD  isosorbide mononitrate (IMDUR) 120 MG 24 hr tablet Take 1 tablet (120 mg total) by mouth daily. Patient not taking: Reported on 05/15/2019 03/13/19   Darreld Mclean, PA-C  levalbuterol Penne Lash) 0.63 MG/3ML nebulizer solution Take 3 mLs (0.63 mg total) by nebulization 2 (two) times daily. Patient not taking: Reported on 05/15/2019 03/12/19   Darreld Mclean, PA-C  Rivaroxaban (XARELTO) 15 MG TABS tablet Take 1 tablet (15 mg total) by mouth daily with supper. Patient not taking: Reported on 05/15/2019 09/03/18   [name], MD   Physical Exam: Blood pressure (!) 148/58, pulse 92, resp. rate (!) 27, SpO2 97 %. 1. General:  in  Acute distress with increased work of breathing     Chronically ill -appearing 2. Psychological: Alert and    Oriented 3. Head/ENT:   Moist   Mucous Membranes                          Head Non traumatic, neck supple                            Poor Dentition 4. SKIN: normal Skin turgor,  Skin clean Dry and intact no rash 5. Heart: Regular rate and rhythm no Murmur, no Rub or gallop 6. Lungs:  some wheezes some  crackles   7. Abdomen: Soft,  non-tender, Non distended  bowel sounds present 8. Lower extremities: no clubbing, cyanosis, bilateral peripheral edema 9. Neurologically Grossly intact, moving all 4 extremities equally   10. MSK: Normal range of motion   All other LABS:     Recent Labs  Lab 05/15/19 1626  WBC 10.7*  NEUTROABS 7.4  HGB 8.0*  HCT 26.6*  MCV 89.3  PLT 382     Recent Labs  Lab 05/15/19 1626  NA 137  K 4.0  CL 102  CO2 26  GLUCOSE 138*  BUN 21  CREATININE 1.63*  CALCIUM 8.2*     Recent Labs  Lab 05/15/19 2046  AST 16  ALT 19  ALKPHOS 64  BILITOT 0.2*  PROT 6.3*  ALBUMIN 2.2*       Cultures:    Component Value Date/Time   SDES URINE, RANDOM 04/04/2019 1901   SPECREQUEST  04/04/2019 1901    NONE Performed at [facility], [place] [address]  [place], [place] [address]    CULT >=100,000 COLONIES/mL YEAST (A) 04/04/2019 1901   REPTSTATUS 04/05/2019 FINAL 04/04/2019 1901     Radiological Exams on Admission: Dg Chest Port 1 View  Result Date: 05/15/2019 CLINICAL DATA:  Dyspnea with exertion. EXAM: PORTABLE CHEST 1 VIEW COMPARISON:  05/01/2019 FINDINGS: The heart is enlarged. BILATERAL pulmonary opacities with effusions most consistent with pulmonary edema. Unchanged dual lead pacer. Worsening aeration. IMPRESSION: Worsening aeration. BILATERAL pulmonary opacities with effusions consistent with pulmonary edema. Electronically Signed   By: Staci Righter M.D.   On: 05/15/2019 18:15    Chart has been reviewed    Assessment/Plan  85 y.o. male with medical history significant of complete heart block, HLD, HTN, CKD stage III, chronic diastolic CHF, atrial fibrillation, pacemaker, reticulosis kidney stones.  Admitted for acute respiratory failure likely secondary to CHF exacerbation  Present on Admission: . Acute on chronic respiratory failure with hypoxia (HCC) most likely secondary to CHF exacerbation infectious process less likely given low procalcitonin no history of fevers. Will administer IV Lasix and diuresis monitor in stepdown if shows evidence of infection would not start antibiotics but for now hold off Given increased work of breathing will try BiPAP overnight discussed with PCCM who is aware with patient patient at this point wishes to be full code past hospitalization required intubation Admitted to stepdown Acute on chronic diastolic CHF exacerbation -  - admit on telemetry,  cycle cardiac enzymes, Troponin Invalid input(s): TROP  obtain serial ECG  to evaluate for ischemia as a cause of heart failure  monitor daily weight:  Filed Weights   05/15/19 2230  Weight: 62.5 kg   Last BNP BNP (last 3 results) Recent Labs    02/23/19 0833 05/15/19 1630  BNP 1,628.2* 1,112.4*    ProBNP (last 3 results) No results for  input(s): PROBNP in the last 8760 hours.   diurese with IV lasix and monitor orthostatics and creatinine to avoid over diuresis.   echogram done recently   ACE/ARBi   Contraindicated    cardiology consulted   . Acute-on-chronic kidney disease stage III monitor creatinine while being diuresed avoid nephrotoxic medications   . Atrial fibrillation/flutter -           - CHA2DS2 vas score 4: continue current anticoagulation with  Eliquis,           - Rhythm control:  Continue amiodarone  . CHB (complete heart block) (HCC) - sp pacemaker . Pacemaker in the setting of complete heart block . Hypoalbuminemia -check prealbumin and order nutritional consult . Pressure injury of skin -wound care consult   . Essential hypertension stable hold off on Norvasc while aggressively diuresing to low blood pressure . Hyperlipidemia chronic stable continue home medications   Other plan as per orders.  DVT prophylaxis:  eliquis   Code Status:  FULL CODE  as per patient   I had personally discussed CODE STATUS with patient    Family Communication:   Family not at  Bedside   Disposition Plan:  Back to current facility when stable                                             Would benefit from PT/OT eval prior to DC  Ordered                                      Social Work  consulted                   Nutrition    consulted                  Wound care  consulted                                      Consults called: elink notified, email cardiology    Admission status:  ED Disposition    ED Disposition Condition [place]: [place] [[id number]]  Level of Care: Stepdown [14]  Admit to SDU based on following criteria: Respiratory Distress:  Frequent assessment and/or intervention to maintain adequate ventilation/respiration, pulmonary toilet, and respiratory treatment.  Covid Evaluation: N/A  Diagnosis: Acute exacerbation of CHF  (congestive heart failure) [facility]) [[id number]]  Admitting Physician: [name] [[id number]]  Attending Physician: [name] [[id number]]  Estimated length of stay: 3 - 4 days  Certification:: I certify this patient will need inpatient services for at least 2 midnights  PT Class (Do Not Modify): Inpatient [101]  PT Acc Code (Do Not Modify): Private [1]          inpatient     Expect 2 midnight stay secondary to severity of patient's current illness including   hemodynamic instability despite optimal treatment (tachycardia   Tachypnea hypoxia,     Severe lab/radiological/exam abnormalities including:  Pulmonary edema   and extensive comorbidities including:       CHF        CKD .   Marland Kitchen Chronic anticoagulation  That are currently affecting medical management.   I expect  patient to be hospitalized for 2 midnights requiring inpatient medical care.  Patient is at high risk for adverse outcome (such as loss of life or disability) if not treated.  Indication for inpatient stay as follows:    Hemodynamic instability despite maximal medical therapy,      inability to maintain oral hydration NPO incase will decompensate     New or worsening hypoxia  Need for IV lasix    Level of care        SDU tele indefinitely please discontinue once patient no longer qualifies  Precautions:  NONE   No active isolations  PPE: Used by the provider:   P100  eye Goggles,  Gloves        Brynlea Spindler 05/15/2019, 10:41 PM    [facility]     after 2 AM please page floor coverage PA If 7AM-7PM, please contact the day team taking care of the patient using [email or web address]

## 2019-05-15 NOTE — ED Triage Notes (Addendum)
Arrived by EMS from [place]. EMS reports patient's O2 saturation was in 70s last night. Positive for pleural effusion. Patient reports dyspnea increases with exertion

## 2019-05-16 DIAGNOSIS — Z950 Presence of cardiac pacemaker: Secondary | ICD-10-CM

## 2019-05-16 DIAGNOSIS — I10 Essential (primary) hypertension: Secondary | ICD-10-CM

## 2019-05-16 DIAGNOSIS — I5033 Acute on chronic diastolic (congestive) heart failure: Secondary | ICD-10-CM

## 2019-05-16 LAB — BLOOD GAS, ARTERIAL
Acid-Base Excess: 6.5 mmol/L — ABNORMAL HIGH (ref 0.0–2.0)
Bicarbonate: 30.2 mmol/L — ABNORMAL HIGH (ref 20.0–28.0)
Drawn by: 560031
O2 Content: 7 L/min
O2 Saturation: 93.8 %
Patient temperature: 37
pCO2 arterial: 41.3 mmHg (ref 32.0–48.0)
pH, Arterial: 7.477 — ABNORMAL HIGH (ref 7.350–7.450)
pO2, Arterial: 66.8 mmHg — ABNORMAL LOW (ref 83.0–108.0)

## 2019-05-16 LAB — CBC
HCT: 27.7 % — ABNORMAL LOW (ref 39.0–52.0)
Hemoglobin: 8.2 g/dL — ABNORMAL LOW (ref 13.0–17.0)
MCH: 26.4 pg (ref 26.0–34.0)
MCHC: 29.6 g/dL — ABNORMAL LOW (ref 30.0–36.0)
MCV: 89.1 fL (ref 80.0–100.0)
Platelets: 372 10*3/uL (ref 150–400)
RBC: 3.11 MIL/uL — ABNORMAL LOW (ref 4.22–5.81)
RDW: 16.5 % — ABNORMAL HIGH (ref 11.5–15.5)
WBC: 10.3 10*3/uL (ref 4.0–10.5)
nRBC: 0 % (ref 0.0–0.2)

## 2019-05-16 LAB — PREALBUMIN: Prealbumin: 7.8 mg/dL — ABNORMAL LOW (ref 18–38)

## 2019-05-16 LAB — ABO/RH: ABO/RH(D): O POS

## 2019-05-16 LAB — COMPREHENSIVE METABOLIC PANEL
ALT: 19 U/L (ref 0–44)
AST: 15 U/L (ref 15–41)
Albumin: 2.3 g/dL — ABNORMAL LOW (ref 3.5–5.0)
Alkaline Phosphatase: 66 U/L (ref 38–126)
Anion gap: 9 (ref 5–15)
BUN: 19 mg/dL (ref 8–23)
CO2: 28 mmol/L (ref 22–32)
Calcium: 8.2 mg/dL — ABNORMAL LOW (ref 8.9–10.3)
Chloride: 103 mmol/L (ref 98–111)
Creatinine, Ser: 1.45 mg/dL — ABNORMAL HIGH (ref 0.61–1.24)
GFR calc Af Amer: 48 mL/min — ABNORMAL LOW (ref >60)
GFR calc non Af Amer: 41 mL/min — ABNORMAL LOW (ref >60)
Glucose, Bld: 103 mg/dL — ABNORMAL HIGH (ref 70–99)
Potassium: 3.6 mmol/L (ref 3.5–5.1)
Sodium: 140 mmol/L (ref 135–145)
Total Bilirubin: 0.3 mg/dL (ref 0.3–1.2)
Total Protein: 6.4 g/dL — ABNORMAL LOW (ref 6.5–8.1)

## 2019-05-16 LAB — TROPONIN I
Troponin I: 0.03 ng/mL (ref <0.03)
Troponin I: 0.04 ng/mL (ref <0.03)

## 2019-05-16 LAB — TYPE AND SCREEN
ABO/RH(D): O POS
Antibody Screen: NEGATIVE

## 2019-05-16 LAB — TSH: TSH: 0.808 u[IU]/mL (ref 0.350–4.500)

## 2019-05-16 LAB — LACTIC ACID, PLASMA: Lactic Acid, Venous: 0.8 mmol/L (ref 0.5–1.9)

## 2019-05-16 LAB — PHOSPHORUS: Phosphorus: 4.6 mg/dL (ref 2.5–4.6)

## 2019-05-16 LAB — MAGNESIUM: Magnesium: 2 mg/dL (ref 1.7–2.4)

## 2019-05-16 MED ORDER — GERHARDT'S BUTT CREAM
TOPICAL_CREAM | Freq: Two times a day (BID) | CUTANEOUS | Status: DC
  Administered 2019-05-16 – 2019-05-20 (×6): via TOPICAL
  Filled 2019-05-16 (×3): qty 1

## 2019-05-16 NOTE — Consult Note (Signed)
[place] Nurse wound consult note Reason for Consult:Stage 2 sacral pressure injury, POA.   Wound type:pressure injury, partial thickness Pressure Injury POA: Yes Measurement: 2 cm x 2 cm blanchable erythema with nonintact pink moist center  Wound XBW:IOMB moist Drainage (amount, consistency, odor) scant weeping Periwound:blanchable erythema Dressing procedure/placement/frequency:  Cleanse buttocks and sacrum with soap and water.  Apply Gerhardts paste twice daily.  Turn and reposition every two hours to oflfoad pressure.  Will not follow at this time.  Please re-consult if needed.  Domenic Moras MSN, RN, FNP-BC CWON Wound, Ostomy, Continence Nurse Pager [phone]

## 2019-05-16 NOTE — Evaluation (Addendum)
Occupational Therapy Evaluation Patient Details Name: Andre [name] MRN: [id number] DOB: [birth year] Today's Date: 05/16/2019    History of Present Illness 85 year old male with history of complete heart block status post pacemaker placement, hyperlipidemia, hypertension, chronic diastolic CHF, CKD stage III, atrial fibrillation who was admitted  from [facility] with complaints of worsening shortness of breath, tested COvid negative and facility without any positive cases.   Patient is on oxygen at 4 L/min at home.  He was admitted in April with congestive heart failure requiring intubation.  On arrival he was hypoxic satting in the range of 50s.  Started on BiPAP.  Cardiology consulted and following.    Clinical Impression   Pt was admitted for the above.  He was at [facility] for rehab prior to admission.  Unsure of amount of assistance pt needed.  He is limited by breathing:  sats dropped to mid 80s and RR up to 33 during evaluation. Pt does pace himself and initiated pursed lip breathing.  It takes a few minutes for pt to recover, and he is good about pacing himself.  Pt mostly needs min guard for mobility and min A for LB adls. Will follow in acute setting with supervision level goals.     Follow Up Recommendations  SNF;Supervision/Assistance - 24 hour    Equipment Recommendations  (tba further, possibly 3:1 commode)    Recommendations for Other Services       Precautions / Restrictions Precautions Precautions: Fall Precaution Comments: O2 dependent  Restrictions Weight Bearing Restrictions: No      Mobility Bed Mobility Overal bed mobility: Needs Assistance Bed Mobility: Supine to Sit     Supine to sit: Min guard;Supervision     General bed mobility comments: for safety and lines, incr time, desats to mid 80s with EOB and requires incr time to recover with pursed lip breathing  Transfers Overall transfer level: Needs assistance Equipment used: Rolling walker (2  wheeled) Transfers: Sit to/from [facility] Sit to Stand: Min guard Stand pivot transfers: Min guard       General transfer comment: cues for overall safety, close guarding as pt is unsteady during stand pivot, assist for line management    Balance Overall balance assessment: Needs assistance           Standing balance-Leahy Scale: Fair Standing balance comment: unsteady during dynamic activity without UE support                           ADL either performed or assessed with clinical judgement   ADL Overall ADL's : Needs assistance/impaired             Lower Body Bathing: Minimal assistance       Lower Body Dressing: Minimal assistance   Toilet Transfer: Min guard;Stand-pivot;RW(chair)   Toileting- Clothing Manipulation and Hygiene: Min guard         General ADL Comments: Pt is mostly limited by breathing:  sats to mid 80s; recovered with time and initiated pursed lip breathing on his own.  RR up to 33 with activity.  Pt was on 6 liters of 02 and uses 4 at baseline, per chart.  Pt needs mostly set up/supervision for UB/seated adls     Vision    pt has macular degeneration     Perception     Praxis      Pertinent Vitals/Pain Pain Assessment: No/denies pain     Hand Dominance  Extremity/Trunk Assessment Upper Extremity Assessment Upper Extremity Assessment: Overall WFL for tasks assessed   Lower Extremity Assessment Lower Extremity Assessment: Overall WFL for tasks assessed       Communication Communication Communication: No difficulties   Cognition Arousal/Alertness: Awake/alert Behavior During Therapy: WFL for tasks assessed/performed Overall Cognitive Status: Within Functional Limits for tasks assessed                                     General Comments  Pt states that he has been at [facility] for rehab.  His wife is at home with a caregiver during the day and one of the children stays with  her at night    Exercises     Shoulder Instructions      Home Living Family/patient expects to be discharged to:: Unsure                                 Additional Comments: has been in rehab at [facility] for 3 months      Prior Functioning/Environment          Comments: unsure of amount of ADL assistance needed        OT Problem List: Decreased activity tolerance;Cardiopulmonary status limiting activity;Decreased knowledge of use of DME or AE;Impaired balance (sitting and/or standing)      OT Treatment/Interventions: Self-care/ADL training;Energy conservation;DME and/or AE instruction;Patient/family education;Balance training;Therapeutic activities    OT Goals(Current goals can be found in the care plan section) Acute Rehab OT Goals Patient Stated Goal: get better and go back home OT Goal Formulation: With patient Time For Goal Achievement: 05/30/19 Potential to Achieve Goals: Good ADL Goals Pt Will Perform Grooming: (P) with supervision;standing Pt Will Perform Lower Body Bathing: (P) with supervision;sit to/from stand Pt Will Perform Lower Body Dressing: (P) with supervision;sit to/from stand Pt Will Transfer to Toilet: (P) with supervision;bedside commode;ambulating;regular height toilet Pt Will Perform Toileting - Clothing Manipulation and hygiene: (P) with supervision;sit to/from stand  OT Frequency: Min 2X/week   Barriers to D/C:            Co-evaluation   Reason for Co-Treatment: To address functional/ADL transfers PT goals addressed during session: Mobility/safety with mobility        AM-PAC OT "6 Clicks" Daily Activity     Outcome Measure Help from another person eating meals?: None Help from another person taking care of personal grooming?: A Little Help from another person toileting, which includes using toliet, bedpan, or urinal?: A Little Help from another person bathing (including washing, rinsing, drying)?: A Little Help from  another person to put on and taking off regular upper body clothing?: A Little Help from another person to put on and taking off regular lower body clothing?: A Little 6 Click Score: 19   End of Session    Activity Tolerance: Patient tolerated treatment well Patient left: in chair;with call bell/phone within reach;with chair alarm set  OT Visit Diagnosis: Unsteadiness on feet (R26.81)                Time: 5885-0277 OT Time Calculation (min): 19 min Charges:  OT General Charges $OT Visit: 1 Visit OT Evaluation $OT Eval Low Complexity: Luther, OTR/L Acute Rehabilitation Services [phone] [facility] pager [phone] office 05/16/2019  [place] 05/16/2019, 2:23 PM

## 2019-05-16 NOTE — Consult Note (Signed)
Cardiology Consult    Patient ID: DANTE ROUDEBUSH MRN: [id number], DOB/AGE: 1923-08-24   Admit date: 05/15/2019 Date of Consult: 05/16/2019  Primary Physician: Rosaria Ferries, MD Primary Cardiologist: Kirk Ruths, MD Requesting Provider: [name], MD  Patient Profile    QUINLIN CONANT is a 85 y.o. male with a history of chronic diastolic CHF with EF of 62-69% in 02/2019, paroxsymal atrial fibrillation on Amiodarone and Eliquis, complete heart block s/p PPM implantation on 02/24/2019, PAD, hypertension, hyperlipidemia, who is being seen today for the evaluation of CHF at the request of Dr. Roel Cluck.  History of Present Illness    Mr. Modica is a 85 year old male with the above history who is followed by [name]. Patient has had multiple hospital admissions over the last couple of months.  Patient was admitted from 02/23/2019 to 03/12/2019 for complete heart block after presenting with progressive shortness of breath and fatigue. He underwent permanent pacemaker implantation at that time and tolerated the procedure well.  TTE showed LVEF of 60-65. After pacemaker implantation, patient was noted to be in atrial flutter with controlled ventricular rate. Patient continued to report dyspnea on exertion after pacemaker implantation which he felt was similar to how it was 3 years ago when he was out of rhythm. Therefore, he underwent TEE/DCCV during admission. Unfortunately, he reverted back to atrial fibrillation several days following the procedure. Amiodarone was started and patient converted back to sinus rhythm. Patient also developed pulmonary edema after DCCV and O2 sats dropped and eventually had to be moved to the cardiac ICU for further management. Patient was started on Xopenex, diuresed with IV Lasix and one dose of Metolazone. Shortness of breath gradually improved some but patient continued to desaturate to the 80's on room air throughout admission; therefore,  required home O2 at discharge. Hospitalization also complicated by acute on chronic kidney disease with creatinine peaking at 2.32. Creatinine was 2.04 at discharge. Patient was discharged to a SNF with plans to follow up with Cardiology and EP.   Patient was admitted to [facility] from 03/20/2019 to 04/03/2019 4 acute on chronic respiratory failure with hypoxia and hypercapnia.  He was found to have bilateral pneumonia on chest x-ray and was treated for healthcare associated pneumonia.  He also developed pulmonary edema/ARDS and ultimately required intubation from 03/22/2019 to 03/28/2019.  Pulmonology was consulted and he was eventually able to be transitioned to nasal cannula but remained hypoxic with minimal movement on 6 L. Patient was reportedly negative for COVID-19. He was diuresed with IV Lasix with some improvement.  Of note,  chest CT during admission showed a questionable 4.0 x 2.2 cm round soft tissue abnormality in the right anterior epicardial region..  Pulmonology recommended outpatient evaluation with repeat imaging.  Patient was discharged back to SNF.  Patient present to the ED on 05/15/2019 via EMS from SNF with shortness of breath, O2 sats in the 70's the night prior, and reportedly positive for pleural effusions. Patient reports doing relatively well since last hospitalization but has continued to have some dyspnea with activity. Patient cannot really tell me when this started worsening recently. He also reports possible orthopnea and states he sometimes sleeps on an incline and sometimes sleeps flat. He did have an episode of orthopnea a couple of nights ago. No significant edema. He denies any chest pain, palpitations, lightheadedness, or dizziness. He does report a productive cough and states he is coughing up white phlegm as well  as occasional nasal drainage. He states he randomly had a temperature of 100 one day last week but states it was back to normal the next  day. No known exposures to COVID-19 and he states no one at his SNF has tested positive.   In the ED: O2 sats initially in the low 90s on 6 L via nasal cannula.  EKG showed ventricular paced rhythm. Chest x-ray showed worsening aeration with bilateral pulmonary opacities and effusions consistent with pulmonary edema. BNP elevated at 1,112.4.  WBC 10.7, Hgb 8.0, Plts 382. Na 137, K 4.0, Glucose 138, SCr 1.63.  COVID-19 testing negative.  Lactic acid and procalcitonin normal. Patient was started on IV Lasix 60mg  twice daily and admitted for further evaluation/management of acute on chronic diastolic CHF.  At the time of this evaluation, patient states he is breathing better since receiving IV Lasix.  Past Medical History   Past Medical History:  Diagnosis Date   Acute on chronic respiratory failure with hypoxia (HCC)    Adenomatous colon polyp 04/1985   no further colonoscopy, 2008-last colonoscopy, no polyps     Atrial fibrillation (HCC)    Chronic atrial fibrillation    Chronic diastolic heart failure (HCC)    Diverticulosis    History of skin cancer    dermatology every 6 months Dr. [name]   Hyperlipidemia    Hypertension    Lobar pneumonia, unspecified organism ([place])    Macular degeneration    bilateral   Nephrolithiasis    PAF (paroxysmal atrial fibrillation) (HCC)    PAT (paroxysmal atrial tachycardia) ([place])    many years ago, worse with smoking   Severe sepsis [facility])     Past Surgical History:  Procedure Laterality Date   CARDIOVERSION N/A 05/23/2016   Procedure: CARDIOVERSION;  Surgeon: [name], MD;  Location: [place];  Service: Cardiovascular;  Laterality: N/A;   CARDIOVERSION N/A 03/01/2019   Procedure: CARDIOVERSION;  Surgeon: Lelon Perla, MD;  Location: [place];  Service: Cardiovascular;  Laterality: N/A;   CATARACT EXTRACTION     bilateral   INGUINAL HERNIA REPAIR     PACEMAKER IMPLANT N/A 02/24/2019   Procedure: PACEMAKER  IMPLANT;  Surgeon: Deboraha Sprang, MD;  Location: [place] CV LAB;  Service: Cardiovascular;  Laterality: N/A;   TEE WITHOUT CARDIOVERSION N/A 03/22/2016   Procedure: TRANSESOPHAGEAL ECHOCARDIOGRAM (TEE);  Surgeon: [name], MD;  Location: [place];  Service: Cardiovascular;  Laterality: N/A;   TEE WITHOUT CARDIOVERSION N/A 05/23/2016   Procedure: TRANSESOPHAGEAL ECHOCARDIOGRAM (TEE);  Surgeon: [name], MD;  Location: [facility] ENDOSCOPY;  Service: Cardiovascular;  Laterality: N/A;   TEE WITHOUT CARDIOVERSION N/A 03/01/2019   Procedure: TRANSESOPHAGEAL ECHOCARDIOGRAM (TEE);  Surgeon: Lelon Perla, MD;  Location: [facility] ENDOSCOPY;  Service: Cardiovascular;  Laterality: N/A;   TENDON REPAIR  12/11   right leg     Allergies  No Known Allergies  Inpatient Medications     amiodarone  200 mg Oral Daily   apixaban  2.5 mg Oral BID   atorvastatin  10 mg Oral Daily   Chlorhexidine Gluconate Cloth  6 each Topical Daily   furosemide  60 mg Intravenous Q12H   mouth rinse  15 mL Mouth Rinse BID   sodium chloride flush  3 mL Intravenous Q12H    Family History    Family History  Problem Relation Age of Onset   Hypertension Mother    Cancer Father        lung   He  indicated that his mother is deceased. He indicated that his father is deceased. He indicated that his maternal grandmother is deceased. He indicated that his maternal grandfather is deceased. He indicated that his paternal grandmother is deceased. He indicated that his paternal grandfather is deceased.   Social History    Social History   Socioeconomic History   Marital status: Married    Spouse name: Not on file   Number of children: 5   Years of education: Not on file   Highest education level: Not on file  Occupational History   Occupation: RETIRED    Employer: RETIRED  Social Designer, fashion/clothing strain: Not on file   Food insecurity:    Worry: Not on file    Inability: Not  on file   Transportation needs:    Medical: Not on file    Non-medical: Not on file  Tobacco Use   Smoking status: Former Smoker    Packs/day: 1.00    Years: 60.00    Pack years: 60.00    Types: Cigarettes    Last attempt to quit: 12/13/2003    Years since quitting: 15.4   Smokeless tobacco: Never Used  Substance and Sexual Activity   Alcohol use: Yes    Alcohol/week: 0.0 standard drinks    Comment: 2 glass of wine a day   Drug use: No   Sexual activity: Not on file  Lifestyle   Physical activity:    Days per week: Not on file    Minutes per session: Not on file   Stress: Not on file  Relationships   Social connections:    Talks on phone: Not on file    Gets together: Not on file    Attends religious service: Not on file    Active member of club or organization: Not on file    Attends meetings of clubs or organizations: Not on file    Relationship status: Not on file   Intimate partner violence:    Fear of current or ex partner: Not on file    Emotionally abused: Not on file    Physically abused: Not on file    Forced sexual activity: Not on file  Other Topics Concern   Not on file  Social History Narrative   Married (64 years in 08/2015-goes to outside practice). 5 children. 8 grandchildren (lost 1 grandchild #9 to [place] 18,  Lost #9 to seizures)      Retired at [employer], high Cabin crew      Hobbies: time with family, go to beach (51 years in a row), travel     Review of Systems    ROS  Physical Exam    Blood pressure (!) 175/55, pulse 85, temperature 98.3 F (36.8 C), temperature source Oral, resp. rate (!) 26, height 5\' 5"  (1.651 m), weight 61 kg, SpO2 97 %.  General: 85 y.o. male resting comfortably in no acute distress. Pleasant and cooperative. HEENT: Normal  Neck: Supple. Possible JVD. Lungs: Some mild increased work of breathing. Crackles noted bilaterally.  No wheezes or rhonchi.  Heart: RRR. Distinct S1 and S2. Possible systolic  murmurs. No gallops or rubs.  Abdomen: Soft, non-distended, and non-tender to palpation. Bowel sounds present.  Extremities: No clubbing, cyanosis, or edema. Radial pedal pulses 2+ and equal bilaterally. Skin: Warm and dry. Neuro: No focal deficits. Moves all extremities spontaneously. Psych: Normal affect.  Labs    Troponin (Point of Care Test) No results for input(s): TROPIPOC in  the last 72 hours. Recent Labs    05/15/19 1855 05/15/19 2046 05/16/19 0302  TROPONINI 0.04* 0.03* 0.03*   Lab Results  Component Value Date   WBC 10.3 05/16/2019   HGB 8.2 (L) 05/16/2019   HCT 27.7 (L) 05/16/2019   MCV 89.1 05/16/2019   PLT 372 05/16/2019    Recent Labs  Lab 05/16/19 0302  NA 140  K 3.6  CL 103  CO2 28  BUN 19  CREATININE 1.45*  CALCIUM 8.2*  PROT 6.4*  BILITOT 0.3  ALKPHOS 66  ALT 19  AST 15  GLUCOSE 103*   Lab Results  Component Value Date   CHOL 167 01/19/2018   HDL 63 01/19/2018   LDLCALC 84 01/19/2018   TRIG 102 01/19/2018   No results found for: [facility]   Radiology Studies    Dg Chest Port 1 View  Result Date: 05/15/2019 CLINICAL DATA:  Dyspnea with exertion. EXAM: PORTABLE CHEST 1 VIEW COMPARISON:  05/01/2019 FINDINGS: The heart is enlarged. BILATERAL pulmonary opacities with effusions most consistent with pulmonary edema. Unchanged dual lead pacer. Worsening aeration. IMPRESSION: Worsening aeration. BILATERAL pulmonary opacities with effusions consistent with pulmonary edema. Electronically Signed   By: Staci Righter M.D.   On: 05/15/2019 18:15    EKG     EKG: EKG was personally reviewed and demonstrates: Ventricular paced rhythm with no significant changes from prior tracings.  Telemetry: Telemetry was personally reviewed and demonstrates: Ventricular paced rhythm.  Cardiac Imaging    Echocardiogram 02/24/2019: - Left Ventricle: The left ventricle has normal systolic function, with an ejection fraction of 60-65%. The cavity size was normal. The  left ventricular wall thickness was not assessed. Left ventricular diastolic Doppler parameters are indeterminate. - Right Ventricle: The right ventricle has normal systolic function. The cavity was normal. There is no increase in right ventricular wall thickness. Pacing wire/catheter visualized in the right ventricle. - Left Atrium: left atrial size was not well visualized - Right Atrium: right atrial size was not well visualized. Right atrial pressure is estimated at 3 mmHg. - Interatrial Septum: The interatrial septum was not well visualized. - Pericardium: There is no evidence of pericardial effusion. - Mitral Valve: The mitral valve was not well visualized. Mitral valve regurgitation is trivial by color flow Doppler. - Tricuspid Valve: The tricuspid valve is not well visualized. Tricuspid valve regurgitation is mild by color flow Doppler. - Aortic Valve: The aortic valve was not well visualized Mild calcification of the aortic valve. Aortic valve regurgitation is trivial by color flow Doppler. - Pulmonic Valve: The pulmonic valve was not well visualized. Pulmonic valve regurgitation is trivial by color flow Doppler. - Venous: The inferior vena cava is normal in size with greater than 50% respiratory variability.  Assessment & Plan    Acute on Chronic Respiratory Failure - Patient presents from SNF with worsening shortness of breath, O2 sats that dropped into the 70's the prior to presentation, and report of pleural effusions. This is patient's 3rd hospitalization since 02/2019 involving acute on chronic respiratory failure and he has been on home O2 since that first hospitalization. - Current episodes felt to be secondary to acute on chronic diastolic CHF. COVID-19 testing negative. Lactic acid and procalcitonin negative.   Acute on Chronic Diastolic CHF - Chest x-ray showed worsening aeration with bilateral pulmonary opacities and effusions consistent with pulmonary edema.  - BNP elevated at  1,112.4 - Patient started on IV Lasix 60 mg twice daily. Output of 1.6 L overnight.  Weight 134.48 lbs, down 3 lbs since yesterday. Renal function improving. - Continue Lasix at current dose.  - Patient previously on Imdur. When discharged from [facility] in 02/2019, patient was on Imdur 120mg  daily. However when discharged from [facility] in 03/2019, was on 15mg  daily. Would recommend restarting Imdur. - Continue to monitor daily weights, strict I/O's, and renal function.  Complete Heart Block s/p PPM  - Telemetry shows ventricular paced rhythm.   Paroxsymal Atrial Fibrillation/ Flutter - Telemetry shows sinus rhythm being ventricular paced.  - Continue Amiodarone 200mg  daily. - Continue Eliquis 2.5mg  twice daily.  Elevated Troponin - Troponin minimally elevated and flat at 0.04 >> 0..03 >> 0.03. Not consistent with ACS.  - Patient denies any angina and no significant changes of EKG compared to prior tracings. - Suspect demand ischemia in the setting of acute on chronic respiratory failure likely secondary to acute diastolic CHF.  Hypertension - Systolic BP as high as the 170's at times. Most recent BP 159/61.  - Patient on Amlodipine at home but this was held on admission. Recommend restarting.   CKD Stage III - Serum creatinine 1.63 on admission. Recent baseline seems to be around 1.2 to 1.6. - Repeat 1.45 this morning after diuresis. - Continue to monitor closely.   Otherwise, per primary team.  Signed, Darreld Mclean, PA-C 05/16/2019, 7:03 AM Pager: [phone] For questions or updates, please contact   Please consult [email or web address] for contact info under Cardiology/STEMI.

## 2019-05-16 NOTE — TOC Initial Note (Signed)
Transition of Care [facility]) - Initial/Assessment Note    Patient Details  Name: Andre [name] MRN: [id number] Date of Birth: 08-Nov-1923  Transition of Care [facility]) CM/SW Contact:    Nila Nephew, LCSW Phone Number: [phone] 05/16/2019, 11:35 AM  Clinical Narrative:  Pt admitted with acute respiratory failure secondary to CHF exacerbation. Was at [facility] SNF for rehab PTA- daughter explains pt initially admitted there from [facility] in March, then from there admitted to [facility] and transferred from there to [place], then back to [place]. Daughter states PTA pt was progressing toward [place] home and was walking 60 feet. Using 4L o2 and stated therapy team there felt o2 desat was going to continue to limit his distance mobility even though musculoskeletally he was getting stronger. Daughter states that if pt required more SNF rehab at [place] will want to return to [facility], however they are also hopeful that pt may be at HHPT level. She and family provide much assistance/supervision at home. Spoke with [facility]- they are aware of pt's admission and status and available to plan pt's return if needed. TOC team will follow to assist with DC plan.                  Expected Discharge Plan: Skilled Nursing Facility Barriers to Discharge: Continued Medical Work up   Patient Goals and CMS Choice Patient states their goals for this hospitalization and ongoing recovery are:: get better CMS Medicare.gov Compare Post Acute Care list provided to:: (na) Choice offered to / list presented to : NA  Expected Discharge Plan and Services Expected Discharge Plan: [employer] In-house Referral: Clinical Social Work Discharge Planning Services: CM Consult   Living arrangements for the past 2 months: Single Family Home(however has been in and out of hospital and SNF for past 3 months) Expected Discharge Date: (unknown)                                    Prior Living  Arrangements/Services Living arrangements for the past 2 months: Single Family Home(however has been in and out of hospital and SNF for past 3 months) Lives with:: Spouse Patient language and need for interpreter reviewed:: No Do you feel safe going back to the place where you live?: Yes      Need for Family Participation in Patient Care: Yes (Comment)(daughter and family involved) Care giver support system in place?: Yes (comment)(family and SNF recently)   Criminal Activity/Legal Involvement Pertinent to Current Situation/Hospitalization: No - Comment as needed  Activities of Daily Living Home Assistive Devices/Equipment: Blood pressure cuff, Scales, Oxygen, Nebulizer, Grab bars around toilet, Grab bars in shower, Hand-held shower hose([place] has necessary equipment for their residents) ADL Screening (condition at time of admission) Patient's cognitive ability adequate to safely complete daily activities?: Yes Is the patient deaf or have difficulty hearing?: No Does the patient have difficulty seeing, even when wearing glasses/contacts?: Yes(has macular degeneration) Does the patient have difficulty concentrating, remembering, or making decisions?: No Patient able to express need for assistance with ADLs?: Yes Does the patient have difficulty dressing or bathing?: Yes Independently performs ADLs?: No Communication: Independent Dressing (OT): Needs assistance Is this a change from baseline?: Pre-admission baseline Grooming: Needs assistance Is this a change from baseline?: Pre-admission baseline Feeding: Needs assistance Is this a change from baseline?: Pre-admission baseline Bathing: Needs assistance Is this a change from baseline?: Pre-admission baseline Toileting: Needs  assistance Is this a change from baseline?: Pre-admission baseline In/Out Bed: Needs assistance Is this a change from baseline?: Pre-admission baseline Walks in Home: Needs assistance Is this a change from  baseline?: Pre-admission baseline Does the patient have difficulty walking or climbing stairs?: Yes Weakness of Legs: Both Weakness of Arms/Hands: Both  Permission Sought/Granted Permission sought to share information with : Facility Sport and exercise psychologist, Family Supports Permission granted to share information with : Yes, Verbal Permission Granted  Share Information with NAME: [phone] daughter Mechele Claude  Permission granted to share info w AGENCY: [facility]         Emotional Assessment Appearance:: Appears stated age       Alcohol / Substance Use: Not Applicable Psych Involvement: No (comment)  Admission diagnosis:  Anemia, unspecified type [D64.9] Acute congestive heart failure, unspecified heart failure type [facility] [I50.9] Patient Active Problem List   Diagnosis Date Noted  . Acute exacerbation of CHF (congestive heart failure) ([place]) 05/15/2019  . Hypoalbuminemia 05/15/2019  . Acute on chronic heart failure ([place]) 05/15/2019  . Acute on chronic diastolic CHF (congestive heart failure) ([place]) 05/15/2019  . Acute on chronic diastolic CHF (congestive heart failure) ([place]) 05/15/2019  . Acute on chronic respiratory failure with hypoxia ([place])   . Severe sepsis ([place])   . Chronic diastolic heart failure ([place])   . Lobar pneumonia, unspecified organism ([place])   . Chronic atrial fibrillation   . Pacemaker 03/12/2019  . Hypoxia 03/12/2019  . Pressure injury of skin 02/27/2019  . CHB (complete heart block) ([place]) 02/23/2019  . Peripheral vascular disease ([place]) 04/21/2016  . Atrial fibrillation/flutter 03/17/2016  . Acute on chronic diastolic heart failure ([place]) 01/05/2016  . Macular degeneration 02/17/2015  . Acute-on-chronic kidney disease stage III 02/17/2015  . Former smoker 02/17/2015  . History of skin cancer   . Malignant neoplasm of prostate ([place]) 01/05/2010  . Hyperlipidemia 12/04/2007  . Essential hypertension 12/04/2007  . Angiodysplasia of intestine with hemorrhage  05/15/2007   PCP:  Rosaria Ferries, MD Pharmacy:   [facility], [place] [address] Phone: [phone] Fax: [phone]  [facility] [place] [facility] [place] [address] Phone: [phone] Fax: [phone]     Social Determinants of Health (SDOH) Interventions    Readmission Risk Interventions Readmission Risk Prevention Plan 03/08/2019  Transportation Screening Complete  PCP or Specialist Appt within 5-7 Days Complete  Home Care Screening Complete  Medication Review (RN CM) Complete  Some recent data might be hidden

## 2019-05-16 NOTE — Progress Notes (Addendum)
PROGRESS NOTE    Andre [name]  DJM:[id number] DOB: Jun 28, 1923 DOA: 05/15/2019 PCP: Rosaria Ferries, MD   Brief Narrative: Patient is a 85 year old male with history of complete heart block status post pacemaker placement, hyperlipidemia, hypertension, chronic diastolic CHF, CKD stage III, atrial fibrillation who presents to the emergency department from home with complaints of worsening shortness of breath.  Patient is on oxygen at 4 L/min at home.  He was admitted in April with congestive heart failure requiring intubation.  On arrival he was hypoxic satting in the range of 50s.  Started on BiPAP.  Cardiology consulted and following.  Started on IV diuresis.  Assessment & Plan:   Active Problems:   Hyperlipidemia   Essential hypertension   Acute-on-chronic kidney disease stage III   Atrial fibrillation/flutter   CHB (complete heart block) (HCC)   Pressure injury of skin   Pacemaker   Hypoxia   Acute on chronic respiratory failure with hypoxia (HCC)   Chronic atrial fibrillation   Acute exacerbation of CHF (congestive heart failure) (HCC)   Hypoalbuminemia   Acute on chronic heart failure (HCC)   Acute on chronic diastolic CHF (congestive heart failure) (HCC)   Acute on chronic diastolic CHF (congestive heart failure) (HCC)   Acute on chronic respiratory failure with hypoxia: Secondary to CHF exacerbation.  Initial imaging did not show any pneumonia.  Antibiotics discontinued.  Started on BiPAP.  Currently on high flow oxygen.  Continue BiPAP at night.ABG this morning shows normal CO2, normal pH but hypoxemia.  Acute on chronic diastolic CHF: Echocardiogram on 3/20 shows ejection fraction of 60 to 65%.  Continue diuresis with IV Lasix.  Cardiology following.  Has crackles bilaterally on the basis.  No peripheral edema. Mildly elevated troponin secondary to CHF.  Acute on chronic kidney disease stage III: Baseline creatinine ranges from 1.5-2.Currently kidney function  on baseline.  Continue to monitor  Atrial flutter/fibrillation:CHA2DS2 vas score 4.  Currently in sinus with paced rhythm.  Continue amiodarone.  On Eliquis for anticoagulation.  Complete heart block: Status post pacemaker.  Follows with cardiology  Hypertension: Currently blood pressure stable.  Amlodipine held  Hyperlipidemia: Continue home medications  Deconditioning/debility: Physical therapy/Occupational Therapy consulted         DVT prophylaxis:Eliquis Code Status: Full Family Communication:Called daughter on phone Disposition Plan: Undetermined at this point.  Likely back to home after resolution of CHF exacerbation   Consultants: Cardiology  Procedures: None  Antimicrobials:  Anti-infectives (From admission, onward)   None      Subjective: Patient seen and examined at bedside this morning.  Still on 5 L of oxygen per minute.  Currently not on any respiratory distress.  Says he feels better.  BiPAP has been off.  Objective: Vitals:   05/16/19 0400 05/16/19 0500 05/16/19 0600 05/16/19 0800  BP: (!) 151/39 (!) 171/46 (!) 175/55 (!) 159/61  Pulse: 81 89 85 93  Resp: (!) 21 (!) 22 (!) 26 (!) 31  Temp:    97.8 F (36.6 C)  TempSrc:    Oral  SpO2: 100% 98% 97% 94%  Weight:  61 kg    Height:        Intake/Output Summary (Last 24 hours) at 05/16/2019 1054 Last data filed at 05/16/2019 0900 Gross per 24 hour  Intake -  Output 2620 ml  Net -2620 ml   Filed Weights   05/15/19 2230 05/16/19 0500  Weight: 62.5 kg 61 kg    Examination:  General exam: Not in distress,average  built HEENT:PERRL,Oral mucosa moist, Ear/Nose normal on gross exam Respiratory system: Bilateral decreased air entry, bilateral basal crackles Cardiovascular system: S1 & S2 heard, RRR. No JVD, murmurs, rubs, gallops or clicks. No pedal edema. Gastrointestinal system: Abdomen is nondistended, soft and nontender. No organomegaly or masses felt. Normal bowel sounds heard. Central nervous  system: Alert and oriented. No focal neurological deficits. Extremities: No edema, no clubbing ,no cyanosis, distal peripheral pulses palpable. Skin: No rashes, lesions or ulcers,no icterus ,no pallor   Data Reviewed: I have personally reviewed following labs and imaging studies  CBC: Recent Labs  Lab 05/15/19 1626 05/16/19 0302  WBC 10.7* 10.3  NEUTROABS 7.4  --   HGB 8.0* 8.2*  HCT 26.6* 27.7*  MCV 89.3 89.1  PLT 382 242   Basic Metabolic Panel: Recent Labs  Lab 05/15/19 1626 05/16/19 0302  NA 137 140  K 4.0 3.6  CL 102 103  CO2 26 28  GLUCOSE 138* 103*  BUN 21 19  CREATININE 1.63* 1.45*  CALCIUM 8.2* 8.2*  MG  --  2.0  PHOS  --  4.6   GFR: Estimated Creatinine Clearance: 27.5 mL/min (A) (by C-G formula based on SCr of 1.45 mg/dL (H)). Liver Function Tests: Recent Labs  Lab 05/15/19 2046 05/16/19 0302  AST 16 15  ALT 19 19  ALKPHOS 64 66  BILITOT 0.2* 0.3  PROT 6.3* 6.4*  ALBUMIN 2.2* 2.3*   No results for input(s): LIPASE, AMYLASE in the last 168 hours. No results for input(s): AMMONIA in the last 168 hours. Coagulation Profile: No results for input(s): INR, PROTIME in the last 168 hours. Cardiac Enzymes: Recent Labs  Lab 05/15/19 1855 05/15/19 2046 05/16/19 0302 05/16/19 0910  TROPONINI 0.04* 0.03* 0.03* 0.04*   BNP (last 3 results) No results for input(s): PROBNP in the last 8760 hours. HbA1C: No results for input(s): HGBA1C in the last 72 hours. CBG: No results for input(s): GLUCAP in the last 168 hours. Lipid Profile: No results for input(s): CHOL, HDL, LDLCALC, TRIG, CHOLHDL, LDLDIRECT in the last 72 hours. Thyroid Function Tests: Recent Labs    05/16/19 0302  TSH 0.808   Anemia Panel: No results for input(s): VITAMINB12, FOLATE, FERRITIN, TIBC, IRON, RETICCTPCT in the last 72 hours. Sepsis Labs: Recent Labs  Lab 05/15/19 1715 05/15/19 2046 05/16/19 0302  PROCALCITON  --  0.36  --   LATICACIDVEN 1.2  --  0.8    Recent  Results (from the past 240 hour(s))  Culture, blood (routine x 2)     Status: None (Preliminary result)   Collection Time: 05/15/19  4:20 PM  Result Value Ref Range Status   Specimen Description   Final    BLOOD LEFT ANTECUBITAL Performed at [facility], [place] [address], [place], [place] [address]    Special Requests   Final    BOTTLES DRAWN AEROBIC AND ANAEROBIC Blood Culture adequate volume Performed at [facility] [address] [place], [place] [address]    Culture   Final    NO GROWTH < 12 HOURS Performed at [facility] [address], [place],  [address]    Report Status PENDING  Incomplete  SARS Coronavirus 2 (CEPHEID- Performed in [place] hospital lab), Hosp Order     Status: None   Collection Time: 05/15/19  4:25 PM  Result Value Ref Range Status   SARS Coronavirus 2 NEGATIVE NEGATIVE Final    Comment: (NOTE) If result is NEGATIVE SARS-CoV-2 target  nucleic acids are NOT DETECTED. The SARS-CoV-2 RNA is generally detectable in upper and lower  respiratory specimens during the acute phase of infection. The lowest  concentration of SARS-CoV-2 viral copies this assay can detect is 250  copies / mL. A negative result does not preclude SARS-CoV-2 infection  and should not be used as the sole basis for treatment or other  patient management decisions.  A negative result may occur with  improper specimen collection / handling, submission of specimen other  than nasopharyngeal swab, presence of viral mutation(s) within the  areas targeted by this assay, and inadequate number of viral copies  (<250 copies / mL). A negative result must be combined with clinical  observations, patient history, and epidemiological information. If result is POSITIVE SARS-CoV-2 target nucleic acids are DETECTED. The SARS-CoV-2 RNA is generally detectable in upper and lower  respiratory specimens dur ing the acute phase of infection.  Positive   results are indicative of active infection with SARS-CoV-2.  Clinical  correlation with patient history and other diagnostic information is  necessary to determine patient infection status.  Positive results do  not rule out bacterial infection or co-infection with other viruses. If result is PRESUMPTIVE POSTIVE SARS-CoV-2 nucleic acids MAY BE PRESENT.   A presumptive positive result was obtained on the submitted specimen  and confirmed on repeat testing.  While 2019 novel coronavirus  (SARS-CoV-2) nucleic acids may be present in the submitted sample  additional confirmatory testing may be necessary for epidemiological  and / or clinical management purposes  to differentiate between  SARS-CoV-2 and other Sarbecovirus currently known to infect humans.  If clinically indicated additional testing with an alternate test  methodology [phone]) is advised. The SARS-CoV-2 RNA is generally  detectable in upper and lower respiratory sp ecimens during the acute  phase of infection. The expected result is Negative. Fact Sheet for Patients:  StrictlyIdeas.no Fact Sheet for Healthcare Providers: BankingDealers.co.za This test is not yet approved or cleared by the [place] FDA and has been authorized for detection and/or diagnosis of SARS-CoV-2 by FDA under an Emergency Use Authorization (EUA).  This EUA will remain in effect (meaning this test can be used) for the duration of the COVID-19 declaration under Section 564(b)(1) of the Act, 21 U.S.C. section 360bbb-3(b)(1), unless the authorization is terminated or revoked sooner. Performed at [facility], [place] [address], [place], [place] [address]   Culture, blood (routine x 2)     Status: None (Preliminary result)   Collection Time: 05/15/19  4:47 PM  Result Value Ref Range Status   Specimen Description   Final    BLOOD RIGHT HAND Performed at [facility] [address], [place], [place] [address]    Special Requests   Final    BOTTLES DRAWN AEROBIC AND ANAEROBIC Blood Culture adequate volume Performed at [facility] [address], [place], [place] [address]    Culture   Final    NO GROWTH < 12 HOURS Performed at [facility] [address], [place], [place] [address]    Report Status PENDING  Incomplete  MRSA PCR Screening     Status: None   Collection Time: 05/15/19 10:35 PM  Result Value Ref Range Status   MRSA by PCR NEGATIVE NEGATIVE Final    Comment:        The GeneXpert MRSA Assay (FDA approved for NASAL specimens only), is one component of a comprehensive MRSA colonization surveillance program.  It is not intended to diagnose MRSA infection nor to guide or monitor treatment for MRSA infections. Performed at [facility], [place] [address], [place], [place] [address]          Radiology Studies: Dg Chest Port 1 View  Result Date: 05/15/2019 CLINICAL DATA:  Dyspnea with exertion. EXAM: PORTABLE CHEST 1 VIEW COMPARISON:  05/01/2019 FINDINGS: The heart is enlarged. BILATERAL pulmonary opacities with effusions most consistent with pulmonary edema. Unchanged dual lead pacer. Worsening aeration. IMPRESSION: Worsening aeration. BILATERAL pulmonary opacities with effusions consistent with pulmonary edema. Electronically Signed   By: Staci Righter M.D.   On: 05/15/2019 18:15        Scheduled Meds: . amiodarone  200 mg Oral Daily  . apixaban  2.5 mg Oral BID  . atorvastatin  10 mg Oral Daily  . Chlorhexidine Gluconate Cloth  6 each Topical Daily  . furosemide  60 mg Intravenous Q12H  . mouth rinse  15 mL Mouth Rinse BID  . sodium chloride flush  3 mL Intravenous Q12H   Continuous Infusions: . sodium chloride       LOS: 1 day    Time spent:35 mins. More than 50% of that time was spent in counseling and/or coordination of care.      Shelly Coss, MD [facility] Pager [phone]  If 7PM-7AM, please contact night-coverage [email or web address] Password [facility] 05/16/2019, 10:54 AM

## 2019-05-16 NOTE — Evaluation (Signed)
Physical Therapy Evaluation Patient Details Name: Andre [name] MRN: [id number] DOB: 1923-09-03 Today's Date: 05/16/2019   History of Present Illness  85 year old male with history of complete heart block status post pacemaker placement, hyperlipidemia, hypertension, chronic diastolic CHF, CKD stage III, atrial fibrillation who was admitted  from [facility] with complaints of worsening shortness of breath, tested COvid negative and facility without any positive cases.   Patient is on oxygen at 4 L/min at home.  He was admitted in April with congestive heart failure requiring intubation.  On arrival he was hypoxic satting in the range of 50s.  Started on BiPAP.  Cardiology consulted and following.   Clinical Impression  Pt admitted with above diagnosis. Pt currently with functional limitations due to the deficits listed below (see PT Problem List).  Pt dyspneic with EOB, sats decr to mid 80s, max RR 33; pt requires incr time to recover. Pt is pleasant, cooperative and motivated. Will benefit from PT in acute setting. Recommend return to SNF vs home with 24hr care-- apparently pt's wife is home  With caregivers/family assist.   Pt will benefit from skilled PT to increase their independence and safety with mobility to allow discharge to the venue listed below.       Follow Up Recommendations SNF(vs HHPT and 24hr supervision)    Equipment Recommendations  Other (comment)(TBD)    Recommendations for Other Services       Precautions / Restrictions Precautions Precautions: Fall Precaution Comments: O2 dependent  Restrictions Weight Bearing Restrictions: No      Mobility  Bed Mobility Overal bed mobility: Needs Assistance Bed Mobility: Supine to Sit     Supine to sit: Min guard;Supervision     General bed mobility comments: for safety and lines, incr time, desats to mid 80s with EOB and requires incr time to recover with pursed lip breathing  Transfers Overall transfer level:  Needs assistance Equipment used: Rolling walker (2 wheeled) Transfers: Sit to/from [facility] Sit to Stand: Min guard Stand pivot transfers: Min guard       General transfer comment: cues for overall safety, close guarding as pt is unsteady during stand pivot, assist for line management  Ambulation/Gait             General Gait Details: NT d/t DOE/ decr O2 sats  Stairs            Wheelchair Mobility    Modified Rankin (Stroke Patients Only)       Balance Overall balance assessment: Needs assistance           Standing balance-Leahy Scale: Fair Standing balance comment: unsteady during dynamic activity without UE support                             Pertinent Vitals/Pain Pain Assessment: No/denies pain    Home Living Family/patient expects to be discharged to:: Unsure                 Additional Comments: has been in rehab at [facility] for 3 months    Prior Function           Comments: amb with standard walker per pt; per chart review pt was independent prior to March      Hand Dominance        Extremity/Trunk Assessment   Upper Extremity Assessment Upper Extremity Assessment: Defer to OT evaluation    Lower Extremity Assessment Lower Extremity Assessment: Overall [facility]  for tasks assessed       Communication   Communication: No difficulties  Cognition Arousal/Alertness: Awake/alert Behavior During Therapy: WFL for tasks assessed/performed Overall Cognitive Status: Within Functional Limits for tasks assessed                                        General Comments      Exercises     Assessment/Plan    PT Assessment Patient needs continued PT services  PT Problem List Decreased strength;Cardiopulmonary status limiting activity;Decreased balance;Decreased knowledge of use of DME;Decreased activity tolerance       PT Treatment Interventions DME instruction;Gait training;Functional  mobility training;Patient/family education;Therapeutic activities;Therapeutic exercise    PT Goals (Current goals can be found in the Care Plan section)  Acute Rehab PT Goals Patient Stated Goal: get better and go back home PT Goal Formulation: With patient Time For Goal Achievement: 05/30/19 Potential to Achieve Goals: Good    Frequency Min 3X/week   Barriers to discharge        Co-evaluation PT/OT/SLP Co-Evaluation/Treatment: Yes Reason for Co-Treatment: To address functional/ADL transfers PT goals addressed during session: Mobility/safety with mobility         AM-PAC PT "6 Clicks" Mobility  Outcome Measure Help needed turning from your back to your side while in a flat bed without using bedrails?: A Little Help needed moving from lying on your back to sitting on the side of a flat bed without using bedrails?: A Little Help needed moving to and from a bed to a chair (including a wheelchair)?: A Little Help needed standing up from a chair using your arms (e.g., wheelchair or bedside chair)?: A Little Help needed to walk in hospital room?: A Little Help needed climbing 3-5 steps with a railing? : A Lot 6 Click Score: 17    End of Session   Activity Tolerance: Patient tolerated treatment well Patient left: in chair;with call bell/phone within reach;with chair alarm set   PT Visit Diagnosis: Unsteadiness on feet (R26.81)    Time: 3005-1102 PT Time Calculation (min) (ACUTE ONLY): 23 min   Charges:   PT Evaluation $PT Eval Low Complexity: 1 Low          Kenyon Ana, PT  Pager: [phone] Acute Rehab Dept [facility]): [phone]   05/16/2019   [facility] 05/16/2019, 1:17 PM

## 2019-05-17 DIAGNOSIS — E876 Hypokalemia: Secondary | ICD-10-CM

## 2019-05-17 DIAGNOSIS — I4819 Other persistent atrial fibrillation: Secondary | ICD-10-CM

## 2019-05-17 LAB — BASIC METABOLIC PANEL
Anion gap: 10 (ref 5–15)
BUN: 18 mg/dL (ref 8–23)
CO2: 29 mmol/L (ref 22–32)
Calcium: 8.3 mg/dL — ABNORMAL LOW (ref 8.9–10.3)
Chloride: 97 mmol/L — ABNORMAL LOW (ref 98–111)
Creatinine, Ser: 1.36 mg/dL — ABNORMAL HIGH (ref 0.61–1.24)
GFR calc Af Amer: 52 mL/min — ABNORMAL LOW (ref >60)
GFR calc non Af Amer: 45 mL/min — ABNORMAL LOW (ref >60)
Glucose, Bld: 146 mg/dL — ABNORMAL HIGH (ref 70–99)
Potassium: 3.3 mmol/L — ABNORMAL LOW (ref 3.5–5.1)
Sodium: 136 mmol/L (ref 135–145)

## 2019-05-17 MED ORDER — TRAZODONE HCL 50 MG PO TABS
50.0000 mg | ORAL_TABLET | Freq: Once | ORAL | Status: AC
  Administered 2019-05-17: 50 mg via ORAL
  Filled 2019-05-17: qty 1

## 2019-05-17 MED ORDER — POTASSIUM CHLORIDE CRYS ER 20 MEQ PO TBCR
40.0000 meq | EXTENDED_RELEASE_TABLET | Freq: Once | ORAL | Status: AC
  Administered 2019-05-17: 40 meq via ORAL
  Filled 2019-05-17: qty 2

## 2019-05-17 NOTE — Progress Notes (Signed)
Progress Note  Patient Name: Andre [name] Date of Encounter: 05/17/2019  Primary Cardiologist: Kirk Ruths, MD   Subjective   Pt has a lot of questions about his heart function. He is breathing better, still on O2 (not on O2 at home). He is anxious to discharge. PT working with him.  Inpatient Medications    Scheduled Meds: . amiodarone  200 mg Oral Daily  . apixaban  2.5 mg Oral BID  . atorvastatin  10 mg Oral Daily  . Chlorhexidine Gluconate Cloth  6 each Topical Daily  . furosemide  60 mg Intravenous Q12H  . Gerhardt's butt cream   Topical BID  . mouth rinse  15 mL Mouth Rinse BID  . sodium chloride flush  3 mL Intravenous Q12H   Continuous Infusions: . sodium chloride     PRN Meds: sodium chloride, acetaminophen **OR** acetaminophen, HYDROcodone-acetaminophen, ipratropium-albuterol, ondansetron **OR** ondansetron (ZOFRAN) IV, sodium chloride flush   Vital Signs    Vitals:   05/16/19 2311 05/17/19 0000 05/17/19 0400 05/17/19 0500  BP:      Pulse: 78     Resp: (!) 24     Temp:  98.2 F (36.8 C) 97.9 F (36.6 C)   TempSrc:  Oral Oral   SpO2: 94%     Weight:    60.5 kg  Height:        Intake/Output Summary (Last 24 hours) at 05/17/2019 0733 Last data filed at 05/17/2019 0600 Gross per 24 hour  Intake 300 ml  Output 1550 ml  Net -1250 ml   Last 3 Weights 05/17/2019 05/16/2019 05/15/2019  Weight (lbs) 133 lb 6.1 oz 134 lb 7.7 oz 137 lb 12.6 oz  Weight (kg) 60.5 kg 61 kg 62.5 kg      Telemetry    V-paced rhythm in the 80-90s - Personally Reviewed  ECG    V-paced rhythm, heart rate 99 - Personally Reviewed  Physical Exam   GEN: No acute distress.   Neck: + JVD Cardiac: RRR, no murmurs, rubs, or gallops.  Respiratory: crackles in bases bilatearlly. GI: Soft, nontender, non-distended  MS: No edema; No deformity. Neuro:  Nonfocal  Psych: Normal affect   Labs    Chemistry Recent Labs  Lab 05/15/19 1626 05/15/19 2046 05/16/19 0302  NA 137   --  140  K 4.0  --  3.6  CL 102  --  103  CO2 26  --  28  GLUCOSE 138*  --  103*  BUN 21  --  19  CREATININE 1.63*  --  1.45*  CALCIUM 8.2*  --  8.2*  PROT  --  6.3* 6.4*  ALBUMIN  --  2.2* 2.3*  AST  --  16 15  ALT  --  19 19  ALKPHOS  --  64 66  BILITOT  --  0.2* 0.3  GFRNONAA 36*  --  41*  GFRAA 41*  --  48*  ANIONGAP 9  --  9     Hematology Recent Labs  Lab 05/15/19 1626 05/16/19 0302  WBC 10.7* 10.3  RBC 2.98* 3.11*  HGB 8.0* 8.2*  HCT 26.6* 27.7*  MCV 89.3 89.1  MCH 26.8 26.4  MCHC 30.1 29.6*  RDW 16.7* 16.5*  PLT 382 372    Cardiac Enzymes Recent Labs  Lab 05/15/19 1855 05/15/19 2046 05/16/19 0302 05/16/19 0910  TROPONINI 0.04* 0.03* 0.03* 0.04*   No results for input(s): TROPIPOC in the last 168 hours.   BNP Recent Labs  Lab 05/15/19 1630  BNP 1,112.4*     DDimer No results for input(s): DDIMER in the last 168 hours.   Radiology    Dg Chest Port 1 View  Result Date: 05/15/2019 CLINICAL DATA:  Dyspnea with exertion. EXAM: PORTABLE CHEST 1 VIEW COMPARISON:  05/01/2019 FINDINGS: The heart is enlarged. BILATERAL pulmonary opacities with effusions most consistent with pulmonary edema. Unchanged dual lead pacer. Worsening aeration. IMPRESSION: Worsening aeration. BILATERAL pulmonary opacities with effusions consistent with pulmonary edema. Electronically Signed   By: Staci Righter M.D.   On: 05/15/2019 18:15    Cardiac Studies   Echo 02/24/19:  1. The left ventricle has normal systolic function with an ejection fraction of 60-65%. The cavity size was normal. Left ventricular diastolic Doppler parameters are indeterminate.  2. The right ventricle has normal systolic function. The cavity was normal. There is no increase in right ventricular wall thickness.  3. No pericardial effusion seen, though image quality is limited.  Patient Profile     85 y.o. male with a history of chronic diastolic CHF with EF of 09-62% in 02/2019, paroxsymal atrial  fibrillation on Amiodarone and Eliquis, complete heart block s/p PPM implantation on 02/24/2019, PAD, hypertension,  hyperlipidemia, who is being seen today for the evaluation of CHF.   Assessment & Plan    1. Acute on chronic diastolic heart failure 2. Acute on chronic respiratory failure - BNP elevated on admission to 1112 - diuresing on 60 mg IV lasix BID - overall net negative 2.8 L with 1.2 L urine output yesterday - weight 133 lbs down from 138 lbs - sCr  1.45  K 3.6 - yesterday, labs pending today - continue lasix today, wean oxygen if able   3. Paroxysmal Atrial fibrillation - amiodarone 200 mg daily - anticoagulated with eliquis 2.5 mg BID - This patients CHA2DS2-VASc Score and unadjusted Ischemic Stroke Rate (% per year) is equal to 7.2 % stroke rate/year from a score of 5 (CHF, male, age, HTN)   4. Complete heart block 5. PPM in place - telemetry with V-paced rhythm   6. Elevated troponin - troponin trend is mild and flat, inconsistent with ACS: 0.04 --> 0.03 --> 0.03 --> 0.04 - EKG appears to be V-paced rhythm, similar to prior   7. CKD stage III - sCr 1.45 with K 3.6 - labs pending today - anticipate continuing IV diuresis - daily BMP   8. Hypertension - pressure was labile yesterday, was hypertensive on arrival - home medications of norvasc and imdur on hold while diuresing   9. Hyperlipidemia - on statin       For questions or updates, please contact [facility] Please consult [email or web address] for contact info under        Signed, Ledora Bottcher, PA  05/17/2019, 7:33 AM

## 2019-05-17 NOTE — Telephone Encounter (Signed)
I called and spoke with patient, he is fine with switching office visit to telephone call on 05/30/19.      Virtual Visit Pre-Appointment Phone Call  "(Name), I am calling you today to discuss your upcoming appointment. We are currently trying to limit exposure to the virus that causes COVID-19 by seeing patients at home rather than in the office."  1. "What is the BEST phone number to call the day of the visit?" - include this in appointment notes  2. "Do you have or have access to (through a family member/friend) a smartphone with video capability that we can use for your visit?" a. If yes - list this number in appt notes as "cell" (if different from BEST phone #) and list the appointment type as a VIDEO visit in appointment notes b. If no - list the appointment type as a PHONE visit in appointment notes  3. Confirm consent - "In the setting of the current Covid19 crisis, you are scheduled for a (phone or video) visit with your provider on (date) at (time).  Just as we do with many in-office visits, in order for you to participate in this visit, we must obtain consent.  If you'd like, I can send this to your mychart (if signed up) or email for you to review.  Otherwise, I can obtain your verbal consent now.  All virtual visits are billed to your insurance company just like a normal visit would be.  By agreeing to a virtual visit, we'd like you to understand that the technology does not allow for your provider to perform an examination, and thus may limit your provider's ability to fully assess your condition. If your provider identifies any concerns that need to be evaluated in person, we will make arrangements to do so.  Finally, though the technology is pretty good, we cannot assure that it will always work on either your or our end, and in the setting of a video visit, we may have to convert it to a phone-only visit.  In either situation, we cannot ensure that we have a secure connection.  Are  you willing to proceed?" STAFF: Did the patient verbally acknowledge consent to telehealth visit? Document YES/NO here: YES  4. Advise patient to be prepared - "Two hours prior to your appointment, go ahead and check your blood pressure, pulse, oxygen saturation, and your weight (if you have the equipment to check those) and write them all down. When your visit starts, your provider will ask you for this information. If you have an Apple Watch or Kardia device, please plan to have heart rate information ready on the day of your appointment. Please have a pen and paper handy nearby the day of the visit as well."  5. Give patient instructions for MyChart download to smartphone OR Doximity/[email or web address].me as below if video visit (depending on what platform provider is using)  6. Inform patient they will receive a phone call 15 minutes prior to their appointment time (may be from unknown caller ID) so they should be prepared to answer    TELEPHONE CALL NOTE  Andre [name] has been deemed a candidate for a follow-up tele-health visit to limit community exposure during the Covid-19 pandemic. I spoke with the patient via phone to ensure availability of phone/video source, confirm preferred email & phone number, and discuss instructions and expectations.  I reminded Andre [name] to be prepared with any vital sign and/or heart rhythm information that could potentially be obtained  via home monitoring, at the time of his visit. I reminded Andre [name] to expect a phone call prior to his visit.  Mady Haagensen, [place] 05/17/2019 10:32 AM   INSTRUCTIONS FOR DOWNLOADING THE MYCHART APP TO SMARTPHONE  - The patient must first make sure to have activated MyChart and know their login information - If Apple, go to [employer] and type in MyChart in the search bar and download the app. If Android, ask patient to go to [employer] and type in [place] in the search bar and download the app. The app is free  but as with any other app downloads, their phone may require them to verify saved payment information or Apple/Android password.  - The patient will need to then log into the app with their MyChart username and password, and select [facility] as their healthcare provider to link the account. When it is time for your visit, go to the MyChart app, find appointments, and click Begin Video Visit. Be sure to Select Allow for your device to access the Microphone and Camera for your visit. You will then be connected, and your provider will be with you shortly.  **If they have any issues connecting, or need assistance please contact MyChart service desk [phone])**  **If using a computer, in order to ensure the best quality for their visit they will need to use either of the following Internet Browsers: [employer], or Google Chrome**  IF USING DOXIMITY or DOXY.ME - The patient will receive a link just prior to their visit by text.     FULL LENGTH CONSENT FOR TELE-HEALTH VISIT   I hereby voluntarily request, consent and authorize [facility] and its employed or contracted physicians, physician assistants, nurse practitioners or other licensed health care professionals (the Practitioner), to provide me with telemedicine health care services (the "Services") as deemed necessary by the treating Practitioner. I acknowledge and consent to receive the Services by the Practitioner via telemedicine. I understand that the telemedicine visit will involve communicating with the Practitioner through live audiovisual communication technology and the disclosure of certain medical information by electronic transmission. I acknowledge that I have been given the opportunity to request an in-person assessment or other available alternative prior to the telemedicine visit and am voluntarily participating in the telemedicine visit.  I understand that I have the right to withhold or withdraw my consent  to the use of telemedicine in the course of my care at any time, without affecting my right to future care or treatment, and that the Practitioner or I may terminate the telemedicine visit at any time. I understand that I have the right to inspect all information obtained and/or recorded in the course of the telemedicine visit and may receive copies of available information for a reasonable fee.  I understand that some of the potential risks of receiving the Services via telemedicine include:  Marland Kitchen Delay or interruption in medical evaluation due to technological equipment failure or disruption; . Information transmitted may not be sufficient (e.g. poor resolution of images) to allow for appropriate medical decision making by the Practitioner; and/or  . In rare instances, security protocols could fail, causing a breach of personal health information.  Furthermore, I acknowledge that it is my responsibility to provide information about my medical history, conditions and care that is complete and accurate to the best of my ability. I acknowledge that Practitioner's advice, recommendations, and/or decision may be based on factors not within their control, such  as incomplete or inaccurate data provided by me or distortions of diagnostic images or specimens that may result from electronic transmissions. I understand that the practice of medicine is not an exact science and that Practitioner makes no warranties or guarantees regarding treatment outcomes. I acknowledge that I will receive a copy of this consent concurrently upon execution via email to the email address I last provided but may also request a printed copy by calling the office of [facility].    I understand that my insurance will be billed for this visit.   I have read or had this consent read to me. . I understand the contents of this consent, which adequately explains the benefits and risks of the Services being provided via telemedicine.  . I  have been provided ample opportunity to ask questions regarding this consent and the Services and have had my questions answered to my satisfaction. . I give my informed consent for the services to be provided through the use of telemedicine in my medical care  By participating in this telemedicine visit I agree to the above.

## 2019-05-17 NOTE — Progress Notes (Signed)
PROGRESS NOTE    Andre [name]  FOY:[id number] DOB: 1923/09/10 DOA: 05/15/2019 PCP: Rosaria Ferries, MD   Brief Narrative: Patient is a 85 year old male with history of complete heart block status post pacemaker placement, hyperlipidemia, hypertension, chronic diastolic CHF, CKD stage III, atrial fibrillation who presents to the emergency department from SNF with complaints of worsening shortness of breath.  Patient is on oxygen at 4 L/min at home.  He was admitted in April with congestive heart failure requiring intubation.  On arrival he was hypoxic satting in the range of 50s.  Started on BiPAP.  Cardiology consulted and following.  Started on IV diuresis.  Assessment & Plan:   Active Problems:   Hyperlipidemia   Essential hypertension   Acute-on-chronic kidney disease stage III   Atrial fibrillation/flutter   CHB (complete heart block) (HCC)   Pressure injury of skin   Pacemaker   Hypoxia   Acute on chronic respiratory failure with hypoxia (HCC)   Chronic atrial fibrillation   Acute exacerbation of CHF (congestive heart failure) (HCC)   Hypoalbuminemia   Acute on chronic heart failure (HCC)   Acute on chronic diastolic CHF (congestive heart failure) (HCC)   Acute on chronic diastolic CHF (congestive heart failure) (HCC)   Hypokalemia   Acute on chronic respiratory failure with hypoxia: Secondary to CHF exacerbation.  Initial imaging did not show any pneumonia.  Antibiotics discontinued.  Started on BiPAP.Now off Bipap and on nasal cannula at 4L/min.  Continue BiPAP at night.  Acute on chronic diastolic CHF: Echocardiogram on 3/20 shows ejection fraction of 60 to 65%.  Continue diuresis with IV Lasix.  Cardiology following.  Has crackles bilaterally on the bases.  No peripheral edema. Mildly elevated troponin secondary to CHF.  Acute on chronic kidney disease stage III: Baseline creatinine ranges from 1.5-2.Currently kidney function on baseline.  Continue to monitor   Atrial flutter/fibrillation:CHA2DS2 vas score 4.  Currently in sinus with paced rhythm.  Continue amiodarone.  On Eliquis for anticoagulation.  Complete heart block: Status post pacemaker.  Follows with cardiology  Hypertension: Currently blood pressure stable.  Amlodipine held  Hyperlipidemia: Continue home medications  Deconditioning/debility: Physical therapy/Occupational Therapy consulted.Recommended SNF. Social worker consulted.         DVT prophylaxis:Eliquis Code Status: Full Family Communication: Discussed with daughter on phone yesterday. Disposition Plan: Skilled nursing facility when adequately diuresed.   Consultants: Cardiology  Procedures: None  Antimicrobials:  Anti-infectives (From admission, onward)   None      Subjective: Patient seen and examined the bedside this morning.  Hemodynamically stable.  Feels much better.  Oxygen down to 4 L/min.  He is diuresing very well.  Denied any complains.  Objective: Vitals:   05/17/19 0400 05/17/19 0500 05/17/19 0800 05/17/19 1149  BP:   (!) 132/48 (!) 132/50  Pulse:   81 72  Resp:   (!) 27 15  Temp: 97.9 F (36.6 C)  97.6 F (36.4 C)   TempSrc: Oral  Oral   SpO2:   91% 93%  Weight:  60.5 kg    Height:        Intake/Output Summary (Last 24 hours) at 05/17/2019 1214 Last data filed at 05/17/2019 1152 Gross per 24 hour  Intake 300 ml  Output 1175 ml  Net -875 ml   Filed Weights   05/15/19 2230 05/16/19 0500 05/17/19 0500  Weight: 62.5 kg 61 kg 60.5 kg    Examination:  General exam: Not in distress, elderly debilitated male HEENT:PERRL,Oral mucosa  moist, Ear/Nose normal on gross exam Respiratory system: Bilateral basal crackles, bilateral decreased air entry Cardiovascular system: S1 & S2 heard, RRR. No JVD, murmurs, rubs, gallops or clicks. Gastrointestinal system: Abdomen is nondistended, soft and nontender. No organomegaly or masses felt. Normal bowel sounds heard. Central nervous system: Alert  and oriented. No focal neurological deficits. Extremities: No edema, no clubbing ,no cyanosis, distal peripheral pulses palpable.   Data Reviewed: I have personally reviewed following labs and imaging studies  CBC: Recent Labs  Lab 05/15/19 1626 05/16/19 0302  WBC 10.7* 10.3  NEUTROABS 7.4  --   HGB 8.0* 8.2*  HCT 26.6* 27.7*  MCV 89.3 89.1  PLT 382 035   Basic Metabolic Panel: Recent Labs  Lab 05/15/19 1626 05/16/19 0302 05/17/19 0929  NA 137 140 136  K 4.0 3.6 3.3*  CL 102 103 97*  CO2 26 28 29   GLUCOSE 138* 103* 146*  BUN 21 19 18   CREATININE 1.63* 1.45* 1.36*  CALCIUM 8.2* 8.2* 8.3*  MG  --  2.0  --   PHOS  --  4.6  --    GFR: Estimated Creatinine Clearance: 29 mL/min (A) (by C-G formula based on SCr of 1.36 mg/dL (H)). Liver Function Tests: Recent Labs  Lab 05/15/19 2046 05/16/19 0302  AST 16 15  ALT 19 19  ALKPHOS 64 66  BILITOT 0.2* 0.3  PROT 6.3* 6.4*  ALBUMIN 2.2* 2.3*   No results for input(s): LIPASE, AMYLASE in the last 168 hours. No results for input(s): AMMONIA in the last 168 hours. Coagulation Profile: No results for input(s): INR, PROTIME in the last 168 hours. Cardiac Enzymes: Recent Labs  Lab 05/15/19 1855 05/15/19 2046 05/16/19 0302 05/16/19 0910  TROPONINI 0.04* 0.03* 0.03* 0.04*   BNP (last 3 results) No results for input(s): PROBNP in the last 8760 hours. HbA1C: No results for input(s): HGBA1C in the last 72 hours. CBG: No results for input(s): GLUCAP in the last 168 hours. Lipid Profile: No results for input(s): CHOL, HDL, LDLCALC, TRIG, CHOLHDL, LDLDIRECT in the last 72 hours. Thyroid Function Tests: Recent Labs    05/16/19 0302  TSH 0.808   Anemia Panel: No results for input(s): VITAMINB12, FOLATE, FERRITIN, TIBC, IRON, RETICCTPCT in the last 72 hours. Sepsis Labs: Recent Labs  Lab 05/15/19 1715 05/15/19 2046 05/16/19 0302  PROCALCITON  --  0.36  --   LATICACIDVEN 1.2  --  0.8    Recent Results (from the  past 240 hour(s))  Culture, blood (routine x 2)     Status: None (Preliminary result)   Collection Time: 05/15/19  4:20 PM  Result Value Ref Range Status   Specimen Description   Final    BLOOD LEFT ANTECUBITAL Performed at [facility], [place] [address], [place], [place] [address]    Special Requests   Final    BOTTLES DRAWN AEROBIC AND ANAEROBIC Blood Culture adequate volume Performed at [facility] [address] [place], [place] [address]    Culture   Final    NO GROWTH 2 DAYS Performed at [facility] [address], [place], [place] [address]    Report Status PENDING  Incomplete  SARS Coronavirus 2 (CEPHEID- Performed in [facility] hospital lab), Hosp Order     Status: None   Collection Time: 05/15/19  4:25 PM  Result Value Ref Range Status   SARS Coronavirus 2 NEGATIVE NEGATIVE Final    Comment: (NOTE) If result is NEGATIVE SARS-CoV-2 target  nucleic acids are NOT DETECTED. The SARS-CoV-2 RNA is generally detectable in upper and lower  respiratory specimens during the acute phase of infection. The lowest  concentration of SARS-CoV-2 viral copies this assay can detect is 250  copies / mL. A negative result does not preclude SARS-CoV-2 infection  and should not be used as the sole basis for treatment or other  patient management decisions.  A negative result may occur with  improper specimen collection / handling, submission of specimen other  than nasopharyngeal swab, presence of viral mutation(s) within the  areas targeted by this assay, and inadequate number of viral copies  (<250 copies / mL). A negative result must be combined with clinical  observations, patient history, and epidemiological information. If result is POSITIVE SARS-CoV-2 target nucleic acids are DETECTED. The SARS-CoV-2 RNA is generally detectable in upper and lower  respiratory specimens dur ing the acute phase of infection.  Positive  results are  indicative of active infection with SARS-CoV-2.  Clinical  correlation with patient history and other diagnostic information is  necessary to determine patient infection status.  Positive results do  not rule out bacterial infection or co-infection with other viruses. If result is PRESUMPTIVE POSTIVE SARS-CoV-2 nucleic acids MAY BE PRESENT.   A presumptive positive result was obtained on the submitted specimen  and confirmed on repeat testing.  While 2019 novel coronavirus  (SARS-CoV-2) nucleic acids may be present in the submitted sample  additional confirmatory testing may be necessary for epidemiological  and / or clinical management purposes  to differentiate between  SARS-CoV-2 and other Sarbecovirus currently known to infect humans.  If clinically indicated additional testing with an alternate test  methodology [phone]) is advised. The SARS-CoV-2 RNA is generally  detectable in upper and lower respiratory sp ecimens during the acute  phase of infection. The expected result is Negative. Fact Sheet for Patients:  StrictlyIdeas.no Fact Sheet for Healthcare Providers: BankingDealers.co.za This test is not yet approved or cleared by the [place] FDA and has been authorized for detection and/or diagnosis of SARS-CoV-2 by FDA under an Emergency Use Authorization (EUA).  This EUA will remain in effect (meaning this test can be used) for the duration of the COVID-19 declaration under Section 564(b)(1) of the Act, 21 U.S.C. section 360bbb-3(b)(1), unless the authorization is terminated or revoked sooner. Performed at [facility], [place] [address] [place], [place] [address]   Culture, blood (routine x 2)     Status: None (Preliminary result)   Collection Time: 05/15/19  4:47 PM  Result Value Ref Range Status   Specimen Description   Final    BLOOD RIGHT HAND Performed at [facility]  [address] [place], [place] [address]    Special Requests   Final    BOTTLES DRAWN AEROBIC AND ANAEROBIC Blood Culture adequate volume Performed at [facility] [address], [place], [place] [address]    Culture   Final    NO GROWTH 2 DAYS Performed at [facility] [address], [place], [place] [address]    Report Status PENDING  Incomplete  MRSA PCR Screening     Status: None   Collection Time: 05/15/19 10:35 PM  Result Value Ref Range Status   MRSA by PCR NEGATIVE NEGATIVE Final    Comment:        The GeneXpert MRSA Assay (FDA approved for NASAL specimens only), is one component of a comprehensive MRSA colonization surveillance program. It  is not intended to diagnose MRSA infection nor to guide or monitor treatment for MRSA infections. Performed at [facility], [place] [address], [place], [place] [address]          Radiology Studies: Dg Chest Port 1 View  Result Date: 05/15/2019 CLINICAL DATA:  Dyspnea with exertion. EXAM: PORTABLE CHEST 1 VIEW COMPARISON:  05/01/2019 FINDINGS: The heart is enlarged. BILATERAL pulmonary opacities with effusions most consistent with pulmonary edema. Unchanged dual lead pacer. Worsening aeration. IMPRESSION: Worsening aeration. BILATERAL pulmonary opacities with effusions consistent with pulmonary edema. Electronically Signed   By: Staci Righter M.D.   On: 05/15/2019 18:15        Scheduled Meds: . amiodarone  200 mg Oral Daily  . apixaban  2.5 mg Oral BID  . atorvastatin  10 mg Oral Daily  . Chlorhexidine Gluconate Cloth  6 each Topical Daily  . furosemide  60 mg Intravenous Q12H  . Gerhardt's butt cream   Topical BID  . mouth rinse  15 mL Mouth Rinse BID  . potassium chloride  40 mEq Oral Once  . sodium chloride flush  3 mL Intravenous Q12H   Continuous Infusions: . sodium chloride       LOS: 2 days    Time spent:35 mins. More than 50% of that time was spent in counseling  and/or coordination of care.      Shelly Coss, MD [facility] Pager [phone]  If 7PM-7AM, please contact night-coverage [email or web address] Password [email or web address] 05/17/2019, 12:14 PM

## 2019-05-18 DIAGNOSIS — I480 Paroxysmal atrial fibrillation: Secondary | ICD-10-CM

## 2019-05-18 MED ORDER — POTASSIUM CHLORIDE CRYS ER 20 MEQ PO TBCR
40.0000 meq | EXTENDED_RELEASE_TABLET | Freq: Every day | ORAL | Status: DC
  Administered 2019-05-18 – 2019-05-21 (×4): 40 meq via ORAL
  Filled 2019-05-18 (×4): qty 2

## 2019-05-18 MED ORDER — TRAZODONE HCL 50 MG PO TABS
50.0000 mg | ORAL_TABLET | Freq: Once | ORAL | Status: AC
  Administered 2019-05-19: 50 mg via ORAL
  Filled 2019-05-18: qty 1

## 2019-05-18 MED ORDER — SODIUM CHLORIDE 0.9 % IV SOLN
1.0000 g | INTRAVENOUS | Status: DC
  Administered 2019-05-18 – 2019-05-19 (×2): 1 g via INTRAVENOUS
  Filled 2019-05-18: qty 1
  Filled 2019-05-18: qty 10

## 2019-05-18 NOTE — Progress Notes (Signed)
Progress Note  Patient Name: Andre [name] Date of Encounter: 05/18/2019  Primary Cardiologist: Kirk Ruths, MD   Subjective   Breathing improved but still not at baseline.  IV diuresis provided.  He is worried about the possibility of going home.  He states he cannot go home tomorrow.  Inpatient Medications    Scheduled Meds: . amiodarone  200 mg Oral Daily  . apixaban  2.5 mg Oral BID  . atorvastatin  10 mg Oral Daily  . Chlorhexidine Gluconate Cloth  6 each Topical Daily  . furosemide  60 mg Intravenous Q12H  . Gerhardt's butt cream   Topical BID  . mouth rinse  15 mL Mouth Rinse BID  . sodium chloride flush  3 mL Intravenous Q12H   Continuous Infusions: . sodium chloride     PRN Meds: sodium chloride, acetaminophen **OR** acetaminophen, HYDROcodone-acetaminophen, ipratropium-albuterol, ondansetron **OR** ondansetron (ZOFRAN) IV, sodium chloride flush   Vital Signs    Vitals:   05/18/19 0100 05/18/19 0200 05/18/19 0400 05/18/19 0500  BP:   (!) 136/56   Pulse: 86 85 77   Resp: (!) 21 (!) 22 20   Temp:   97.8 F (36.6 C)   TempSrc:   Oral   SpO2: (!) 82% 91% 100%   Weight:    59.3 kg  Height:        Intake/Output Summary (Last 24 hours) at 05/18/2019 0825 Last data filed at 05/17/2019 2000 Gross per 24 hour  Intake 510 ml  Output 850 ml  Net -340 ml   Last 3 Weights 05/18/2019 05/17/2019 05/16/2019  Weight (lbs) 130 lb 11.7 oz 133 lb 6.1 oz 134 lb 7.7 oz  Weight (kg) 59.3 kg 60.5 kg 61 kg      Telemetry    Ventricular pacing 80s- Personally Reviewed  ECG    V paced 99- Personally Reviewed  Physical Exam   GEN: No acute distress.   Neck: No JVD, nasal cannula oxygen Cardiac: RRR, no murmurs, rubs, or gallops.  Respiratory:  Crackles heard bilaterally. GI: Soft, nontender, non-distended  MS: No edema; No deformity. Neuro:  Nonfocal  Psych: Normal affect   Labs    Chemistry Recent Labs  Lab 05/15/19 1626 05/15/19 2046 05/16/19 0302  05/17/19 0929  NA 137  --  140 136  K 4.0  --  3.6 3.3*  CL 102  --  103 97*  CO2 26  --  28 29  GLUCOSE 138*  --  103* 146*  BUN 21  --  19 18  CREATININE 1.63*  --  1.45* 1.36*  CALCIUM 8.2*  --  8.2* 8.3*  PROT  --  6.3* 6.4*  --   ALBUMIN  --  2.2* 2.3*  --   AST  --  16 15  --   ALT  --  19 19  --   ALKPHOS  --  64 66  --   BILITOT  --  0.2* 0.3  --   GFRNONAA 36*  --  41* 45*  GFRAA 41*  --  48* 52*  ANIONGAP 9  --  9 10     Hematology Recent Labs  Lab 05/15/19 1626 05/16/19 0302  WBC 10.7* 10.3  RBC 2.98* 3.11*  HGB 8.0* 8.2*  HCT 26.6* 27.7*  MCV 89.3 89.1  MCH 26.8 26.4  MCHC 30.1 29.6*  RDW 16.7* 16.5*  PLT 382 372    Cardiac Enzymes Recent Labs  Lab 05/15/19 1855 05/15/19 2046 05/16/19  0302 05/16/19 0910  TROPONINI 0.04* 0.03* 0.03* 0.04*   No results for input(s): TROPIPOC in the last 168 hours.   BNP Recent Labs  Lab 05/15/19 1630  BNP 1,112.4*     DDimer No results for input(s): DDIMER in the last 168 hours.   Radiology    No results found.  Cardiac Studies   Normal EF  Patient Profile     85 y.o. male with acute on chronic diastolic heart failure EF 60 to 65% in March 2020 with paroxysmal atrial fibrillation on amiodarone, Eliquis for anticoagulation pacemaker for complete heart block back in March 2020 with peripheral arterial disease hypertension hyperlipidemia.  Assessment & Plan    Acute on chronic diastolic heart failure - BNP was originally 1 100 - Diuresing with IV Lasix 60 twice daily. -Creatinine down to 1.36 from 1.45 from 1.63 -Out yesterday -340.  Weight down to 59.3 kg from 61  Paroxysmal atrial fibrillation -Amiodarone 200 mg a day chronically with Eliquis 2.5 mg twice a day given age and weight.  CHADSVASc 5.  Stable.  Pacemaker for backup.  Complete heart block -Permanent pacemaker in place, V paced noted on telemetry.  Demand ischemia - Minimally elevated troponin in the setting of heart failure.   Myocardial injury secondary to heart failure.  Not ACS.  Chronic kidney disease stage III - Creatinine ranging from 1.3-1.6. -Mildly improved today.  Essential hypertension -Pressure somewhat labile.  Hypertensive on arrival as we continue to diurese hopefully this will continue to stabilize.  Hyperlipidemia -On statin  Continue with IV diuresis.      For questions or updates, please contact [place] Please consult [email or web address] for contact info under        Signed, Candee Furbish, MD  05/18/2019, 8:25 AM

## 2019-05-18 NOTE — Progress Notes (Signed)
PROGRESS NOTE    Andre [name]  XTK:[id number] DOB: 01/26/1923 DOA: 05/15/2019 PCP: Rosaria Ferries, MD   Brief Narrative: Patient is a 85 year old male with history of complete heart block status post pacemaker placement, hyperlipidemia, hypertension, chronic diastolic CHF, CKD stage III, atrial fibrillation who presents to the emergency department from SNF with complaints of worsening shortness of breath.  Patient is on oxygen at 4 L/min at home.  He was admitted in April with congestive heart failure requiring intubation.  On arrival he was hypoxic satting in the range of 50s.  Started on BiPAP.  Cardiology consulted and following.  Started on IV diuresis. His respiratory status has continued to improve.  Currently he will be continued on IV diuresis.  He has been weaned off BiPAP and currently saturating fine on 4 L oxygen per minute which is his baseline.  Assessment & Plan:   Active Problems:   Hyperlipidemia   Essential hypertension   Acute-on-chronic kidney disease stage III   Atrial fibrillation/flutter   CHB (complete heart block) (HCC)   Pressure injury of skin   Pacemaker   Hypoxia   Acute on chronic respiratory failure with hypoxia (HCC)   Chronic atrial fibrillation   Acute exacerbation of CHF (congestive heart failure) (HCC)   Hypoalbuminemia   Acute on chronic heart failure (HCC)   Acute on chronic diastolic CHF (congestive heart failure) (HCC)   Acute on chronic diastolic CHF (congestive heart failure) (HCC)   Hypokalemia   Acute on chronic respiratory failure with hypoxia: Secondary to CHF exacerbation.  Initial imaging did not show any pneumonia.  Antibiotics discontinued.  Started on BiPAP.Now off Bipap and on nasal cannula at 4L/min.  Continue BiPAP at night.  Acute on chronic diastolic CHF: Echocardiogram on 3/20 shows ejection fraction of 60 to 65%.  Continue diuresis with IV Lasix.  Cardiology following.  Has crackles bilaterally on the bases.  No  peripheral edema. Mildly elevated troponin secondary to CHF.  Acute on chronic kidney disease stage III: Baseline creatinine ranges from 1.5-2.Currently kidney function on baseline.  Continue to monitor  UTI: Urine culture growing gram-negative rods.  We will follow-up culture and sensitivity.  Patient denies any dysuria.  Continue ceftriaxone.  Atrial flutter/fibrillation:CHA2DS2 vas score 4.  Currently in sinus with paced rhythm.  Continue amiodarone.  On Eliquis for anticoagulation.  Complete heart block: Status post pacemaker.  Follows with cardiology  Hypertension: Currently blood pressure stable.  Amlodipine held  Hyperlipidemia: Continue home medications  Deconditioning/debility: Physical therapy/Occupational Therapy consulted.Recommended SNF. Social worker consulted.         DVT prophylaxis:Eliquis Code Status: Full Family Communication: Discussed with daughter on phone on 05/16/19 Disposition Plan: Skilled nursing facility when adequately diuresed.   Consultants: Cardiology  Procedures: None  Antimicrobials:  Anti-infectives (From admission, onward)   None      Subjective: Patient seen and examined the bedside this morning.  Hemodynamically stable.  Respiratory status stable ,currently on 4 L of oxygen per minute.  We will transfer him out of ICU today.  Plan is  to continue IV diuresis.  Objective: Vitals:   05/18/19 0800 05/18/19 0900 05/18/19 1000 05/18/19 1020  BP: (!) 160/51     Pulse: 81 78 79 72  Resp: (!) 21 (!) 21 (!) 22 18  Temp: 97.8 F (36.6 C)     TempSrc: Oral     SpO2: 100% 100% 100% 100%  Weight:      Height:  Intake/Output Summary (Last 24 hours) at 05/18/2019 1104 Last data filed at 05/18/2019 1000 Gross per 24 hour  Intake 270 ml  Output 975 ml  Net -705 ml   Filed Weights   05/16/19 0500 05/17/19 0500 05/18/19 0500  Weight: 61 kg 60.5 kg 59.3 kg    Examination:  General exam: Not in distress, elderly debilitated male  HEENT:PERRL,Oral mucosa moist, Ear/Nose normal on gross exam Respiratory system: Bilateral basal crackles, bilateral decreased air entry on the bases Cardiovascular system: S1 & S2 heard, RRR. No JVD, murmurs, rubs, gallops or clicks. Gastrointestinal system: Abdomen is nondistended, soft and nontender. No organomegaly or masses felt. Normal bowel sounds heard. Central nervous system: Alert and oriented. No focal neurological deficits. Extremities: No edema, no clubbing ,no cyanosis, distal peripheral pulses palpable. Skin: No rashes, lesions or ulcers,no icterus ,no pallor   Data Reviewed: I have personally reviewed following labs and imaging studies  CBC: Recent Labs  Lab 05/15/19 1626 05/16/19 0302  WBC 10.7* 10.3  NEUTROABS 7.4  --   HGB 8.0* 8.2*  HCT 26.6* 27.7*  MCV 89.3 89.1  PLT 382 830   Basic Metabolic Panel: Recent Labs  Lab 05/15/19 1626 05/16/19 0302 05/17/19 0929  NA 137 140 136  K 4.0 3.6 3.3*  CL 102 103 97*  CO2 26 28 29   GLUCOSE 138* 103* 146*  BUN 21 19 18   CREATININE 1.63* 1.45* 1.36*  CALCIUM 8.2* 8.2* 8.3*  MG  --  2.0  --   PHOS  --  4.6  --    GFR: Estimated Creatinine Clearance: 28.5 mL/min (A) (by C-G formula based on SCr of 1.36 mg/dL (H)). Liver Function Tests: Recent Labs  Lab 05/15/19 2046 05/16/19 0302  AST 16 15  ALT 19 19  ALKPHOS 64 66  BILITOT 0.2* 0.3  PROT 6.3* 6.4*  ALBUMIN 2.2* 2.3*   No results for input(s): LIPASE, AMYLASE in the last 168 hours. No results for input(s): AMMONIA in the last 168 hours. Coagulation Profile: No results for input(s): INR, PROTIME in the last 168 hours. Cardiac Enzymes: Recent Labs  Lab 05/15/19 1855 05/15/19 2046 05/16/19 0302 05/16/19 0910  TROPONINI 0.04* 0.03* 0.03* 0.04*   BNP (last 3 results) No results for input(s): PROBNP in the last 8760 hours. HbA1C: No results for input(s): HGBA1C in the last 72 hours. CBG: No results for input(s): GLUCAP in the last 168 hours.  Lipid Profile: No results for input(s): CHOL, HDL, LDLCALC, TRIG, CHOLHDL, LDLDIRECT in the last 72 hours. Thyroid Function Tests: Recent Labs    05/16/19 0302  TSH 0.808   Anemia Panel: No results for input(s): VITAMINB12, FOLATE, FERRITIN, TIBC, IRON, RETICCTPCT in the last 72 hours. Sepsis Labs: Recent Labs  Lab 05/15/19 1715 05/15/19 2046 05/16/19 0302  PROCALCITON  --  0.36  --   LATICACIDVEN 1.2  --  0.8    Recent Results (from the past 240 hour(s))  Culture, blood (routine x 2)     Status: None (Preliminary result)   Collection Time: 05/15/19  4:20 PM  Result Value Ref Range Status   Specimen Description   Final    BLOOD LEFT ANTECUBITAL Performed at [facility], [place] [address] [place], [place] [address]    Special Requests   Final    BOTTLES DRAWN AEROBIC AND ANAEROBIC Blood Culture adequate volume Performed at [place] [address] [place], [place] [address]    Culture   Final  NO GROWTH 3 DAYS Performed at [facility], [place] [address], [place],  [place] [address]    Report Status PENDING  Incomplete  SARS Coronavirus 2 (CEPHEID- Performed in [place] hospital lab), Hosp Order     Status: None   Collection Time: 05/15/19  4:25 PM  Result Value Ref Range Status   SARS Coronavirus 2 NEGATIVE NEGATIVE Final    Comment: (NOTE) If result is NEGATIVE SARS-CoV-2 target nucleic acids are NOT DETECTED. The SARS-CoV-2 RNA is generally detectable in upper and lower  respiratory specimens during the acute phase of infection. The lowest  concentration of SARS-CoV-2 viral copies this assay can detect is 250  copies / mL. A negative result does not preclude SARS-CoV-2 infection  and should not be used as the sole basis for treatment or other  patient management decisions.  A negative result may occur with  improper specimen collection / handling, submission of specimen other  than nasopharyngeal swab, presence of  viral mutation(s) within the  areas targeted by this assay, and inadequate number of viral copies  (<250 copies / mL). A negative result must be combined with clinical  observations, patient history, and epidemiological information. If result is POSITIVE SARS-CoV-2 target nucleic acids are DETECTED. The SARS-CoV-2 RNA is generally detectable in upper and lower  respiratory specimens dur ing the acute phase of infection.  Positive  results are indicative of active infection with SARS-CoV-2.  Clinical  correlation with patient history and other diagnostic information is  necessary to determine patient infection status.  Positive results do  not rule out bacterial infection or co-infection with other viruses. If result is PRESUMPTIVE POSTIVE SARS-CoV-2 nucleic acids MAY BE PRESENT.   A presumptive positive result was obtained on the submitted specimen  and confirmed on repeat testing.  While 2019 novel coronavirus  (SARS-CoV-2) nucleic acids may be present in the submitted sample  additional confirmatory testing may be necessary for epidemiological  and / or clinical management purposes  to differentiate between  SARS-CoV-2 and other Sarbecovirus currently known to infect humans.  If clinically indicated additional testing with an alternate test  methodology [phone]) is advised. The SARS-CoV-2 RNA is generally  detectable in upper and lower respiratory sp ecimens during the acute  phase of infection. The expected result is Negative. Fact Sheet for Patients:  StrictlyIdeas.no Fact Sheet for Healthcare Providers: BankingDealers.co.za This test is not yet approved or cleared by the [place] FDA and has been authorized for detection and/or diagnosis of SARS-CoV-2 by FDA under an Emergency Use Authorization (EUA).  This EUA will remain in effect (meaning this test can be used) for the duration of the COVID-19 declaration under Section  564(b)(1) of the Act, 21 U.S.C. section 360bbb-3(b)(1), unless the authorization is terminated or revoked sooner. Performed at [facility], [place] [address], [place], [place] [address]   Culture, blood (routine x 2)     Status: None (Preliminary result)   Collection Time: 05/15/19  4:47 PM  Result Value Ref Range Status   Specimen Description   Final    BLOOD RIGHT HAND Performed at [facility] [address], [place], [place] [address]    Special Requests   Final    BOTTLES DRAWN AEROBIC AND ANAEROBIC Blood Culture adequate volume Performed at [place] [address], [place], [place] [address]    Culture   Final    NO GROWTH 3 DAYS Performed at [facility], [address] [place], [place] [address]    Report Status PENDING  Incomplete  Urine Culture     Status: Abnormal (Preliminary result)   Collection Time: 05/15/19  9:50 PM  Result Value Ref Range Status   Specimen Description   Final    URINE, CLEAN CATCH Performed at [facility], [place] [address], [place], [place] [address]    Special Requests   Final    NONE Performed at [facility], [place] [address], [place], [place] [address]    Culture >=100,000 COLONIES/mL GRAM NEGATIVE RODS (A)  Final   Report Status PENDING  Incomplete  MRSA PCR Screening     Status: None   Collection Time: 05/15/19 10:35 PM  Result Value Ref Range Status   MRSA by PCR NEGATIVE NEGATIVE Final    Comment:        The GeneXpert MRSA Assay (FDA approved for NASAL specimens only), is one component of a comprehensive MRSA colonization surveillance program. It is not intended to diagnose MRSA infection nor to guide or monitor treatment for MRSA infections. Performed at [facility], [place] [employer] [address] [place], [place] [address]          Radiology Studies: No results found.      Scheduled Meds: . amiodarone   200 mg Oral Daily  . apixaban  2.5 mg Oral BID  . atorvastatin  10 mg Oral Daily  . Chlorhexidine Gluconate Cloth  6 each Topical Daily  . furosemide  60 mg Intravenous Q12H  . Gerhardt's butt cream   Topical BID  . mouth rinse  15 mL Mouth Rinse BID  . sodium chloride flush  3 mL Intravenous Q12H   Continuous Infusions: . sodium chloride       LOS: 3 days    Time spent:35 mins. More than 50% of that time was spent in counseling and/or coordination of care.      Shelly Coss, MD [facility] Pager [phone]  If 7PM-7AM, please contact night-coverage [email or web address] Password [email or web address] 05/18/2019, 11:04 AM

## 2019-05-19 LAB — BASIC METABOLIC PANEL
Anion gap: 8 (ref 5–15)
BUN: 18 mg/dL (ref 8–23)
CO2: 31 mmol/L (ref 22–32)
Calcium: 8.5 mg/dL — ABNORMAL LOW (ref 8.9–10.3)
Chloride: 99 mmol/L (ref 98–111)
Creatinine, Ser: 1.17 mg/dL (ref 0.61–1.24)
GFR calc Af Amer: >60 mL/min (ref >60)
GFR calc non Af Amer: 53 mL/min — ABNORMAL LOW (ref >60)
Glucose, Bld: 118 mg/dL — ABNORMAL HIGH (ref 70–99)
Potassium: 3.7 mmol/L (ref 3.5–5.1)
Sodium: 138 mmol/L (ref 135–145)

## 2019-05-19 LAB — URINE CULTURE: Culture: >=100000 — AB

## 2019-05-19 MED ORDER — POTASSIUM CHLORIDE ER 20 MEQ PO TBCR: 20.0000 meq | EXTENDED_RELEASE_TABLET | Freq: Every day | ORAL | 0 refills | Status: DC

## 2019-05-19 MED ORDER — TRAZODONE HCL 50 MG PO TABS
50.0000 mg | ORAL_TABLET | Freq: Once | ORAL | Status: AC
  Administered 2019-05-19: 50 mg via ORAL
  Filled 2019-05-19: qty 1

## 2019-05-19 MED ORDER — CIPROFLOXACIN HCL 500 MG PO TABS: 500.0000 mg | ORAL_TABLET | Freq: Two times a day (BID) | ORAL | 0 refills | Status: AC

## 2019-05-19 MED ORDER — CIPROFLOXACIN HCL 500 MG PO TABS
500.0000 mg | ORAL_TABLET | Freq: Two times a day (BID) | ORAL | Status: DC
  Administered 2019-05-20 – 2019-05-21 (×3): 500 mg via ORAL
  Filled 2019-05-19 (×3): qty 1

## 2019-05-19 MED ORDER — FUROSEMIDE 20 MG PO TABS: 20.0000 mg | ORAL_TABLET | Freq: Every day | ORAL | 0 refills | Status: DC

## 2019-05-19 NOTE — Progress Notes (Signed)
Pt has refused BIPAP QHS, pt states he can't sleep with it, Rt to monitor and assess as needed.

## 2019-05-19 NOTE — Progress Notes (Signed)
Progress Note  Patient Name: Andre [name] Date of Encounter: 05/19/2019  Primary Cardiologist: Kirk Ruths, MD   Subjective   Feeling stable, feels like he is at baseline.  No chest pain, wearing oxygen currently.  Would like to go home.  States that he has 24 7 care available.  Inpatient Medications    Scheduled Meds: . amiodarone  200 mg Oral Daily  . apixaban  2.5 mg Oral BID  . atorvastatin  10 mg Oral Daily  . Chlorhexidine Gluconate Cloth  6 each Topical Daily  . furosemide  60 mg Intravenous Q12H  . Gerhardt's butt cream   Topical BID  . mouth rinse  15 mL Mouth Rinse BID  . potassium chloride  40 mEq Oral Daily  . sodium chloride flush  3 mL Intravenous Q12H   Continuous Infusions: . sodium chloride Stopped (05/18/19 2054)  . cefTRIAXone (ROCEPHIN)  IV Stopped (05/18/19 1314)   PRN Meds: sodium chloride, acetaminophen **OR** acetaminophen, HYDROcodone-acetaminophen, ipratropium-albuterol, ondansetron **OR** ondansetron (ZOFRAN) IV, sodium chloride flush   Vital Signs    Vitals:   05/18/19 1700 05/18/19 1802 05/18/19 2034 05/19/19 0542  BP:  (!) 141/84 (!) 136/58 (!) 124/50  Pulse: 74 77 80 94  Resp: (!) 23 (!) 24 20 20   Temp:  98.8 F (37.1 C) 98.4 F (36.9 C) 97.8 F (36.6 C)  TempSrc:  Oral Oral Oral  SpO2: 96% 96% 99% 92%  Weight:    57.6 kg  Height:        Intake/Output Summary (Last 24 hours) at 05/19/2019 0814 Last data filed at 05/19/2019 0548 Gross per 24 hour  Intake 100 ml  Output 2500 ml  Net -2400 ml   Last 3 Weights 05/19/2019 05/18/2019 05/17/2019  Weight (lbs) 127 lb 130 lb 11.7 oz 133 lb 6.1 oz  Weight (kg) 57.607 kg 59.3 kg 60.5 kg      Telemetry    Ventricular pacing, mostly in the 80s- Personally Reviewed  ECG    V paced 99- Personally Reviewed  Physical Exam   GEN: No acute distress.  Elderly Neck: No JVD Cardiac: RRR, no murmurs, rubs, or gallops.  Respiratory:  Minimal crackles bilaterally.  Could be chronic GI:  Soft, nontender, non-distended  MS: No edema; No deformity. Neuro:  Nonfocal  Psych: Normal affect   Labs    Chemistry Recent Labs  Lab 05/15/19 2046 05/16/19 0302 05/17/19 0929 05/19/19 0515  NA  --  140 136 138  K  --  3.6 3.3* 3.7  CL  --  103 97* 99  CO2  --  28 29 31   GLUCOSE  --  103* 146* 118*  BUN  --  19 18 18   CREATININE  --  1.45* 1.36* 1.17  CALCIUM  --  8.2* 8.3* 8.5*  PROT 6.3* 6.4*  --   --   ALBUMIN 2.2* 2.3*  --   --   AST 16 15  --   --   ALT 19 19  --   --   ALKPHOS 64 66  --   --   BILITOT 0.2* 0.3  --   --   GFRNONAA  --  41* 45* 53*  GFRAA  --  48* 52* >60  ANIONGAP  --  9 10 8      Hematology Recent Labs  Lab 05/15/19 1626 05/16/19 0302  WBC 10.7* 10.3  RBC 2.98* 3.11*  HGB 8.0* 8.2*  HCT 26.6* 27.7*  MCV 89.3 89.1  MCH 26.8 26.4  MCHC 30.1 29.6*  RDW 16.7* 16.5*  PLT 382 372    Cardiac Enzymes Recent Labs  Lab 05/15/19 1855 05/15/19 2046 05/16/19 0302 05/16/19 0910  TROPONINI 0.04* 0.03* 0.03* 0.04*   No results for input(s): TROPIPOC in the last 168 hours.   BNP Recent Labs  Lab 05/15/19 1630  BNP 1,112.4*     DDimer No results for input(s): DDIMER in the last 168 hours.   Radiology    No results found.  Cardiac Studies   Normal ejection fraction on echocardiogram  Patient Profile     85 y.o. male with acute on chronic diastolic heart failure EF 65% in March 2020 with paroxysmal atrial fibrillation on amiodarone currently in sinus rhythm with Eliquis for anticoagulation low-dose, pacemaker for complete heart block, peripheral arterial disease hypertension hyperlipidemia.  Assessment & Plan    Acute on chronic diastolic heart failure - BNP was originally 1 100 - Diuresing with IV Lasix 60 twice daily. -Creatinine down to 1.17 from 1.36 from 1.45 from 1.63 -Out yesterday - 2.4 L.  Weight down to 127 pounds from 137 pounds on arrival on 05/15/2019 - It does not appear that he was on diuretic at home.  I would  like for him to be discharged on Lasix 20 mg once a day with potassium 20 mEq a day  Paroxysmal atrial fibrillation -Amiodarone 200 mg a day chronically with Eliquis 2.5 mg twice a day given age and weight.  CHADSVASc 5.  Stable.  Pacemaker for backup.  No changes.  Complete heart block -Permanent pacemaker in place, V paced noted on telemetry.  No changes.  Demand ischemia - Minimally elevated troponin in the setting of heart failure.  Myocardial injury secondary to heart failure.  Not ACS.  Stable.  Chronic kidney disease stage III - Creatinine ranging from 1.3-1.6. -Mildly improved today.  Currently 1.17.  Excellent.  Essential hypertension -Pressure somewhat labile.  Hypertensive on arrival as we continue to diurese hopefully this will continue to stabilize.  This seems to be occurring.  Hyperlipidemia -On statin  I am comfortable with his discharge home.  He told me that he has 24/7 care at home.  [facility] will sign off.   Medication Recommendations: Placed on Lasix 20 mg once a day at home with potassium 20 mEq a day Other recommendations (labs, testing, etc): Continue to monitor basic metabolic profile Follow up as an outpatient: We will arrange cardiology outpatient follow-up  For questions or updates, please contact [place] Please consult [email or web address] for contact info under        Signed, Candee Furbish, MD  05/19/2019, 8:14 AM

## 2019-05-19 NOTE — Progress Notes (Signed)
During home oxygen screening, patient was originally on HFNC, during screening patient was switched to regular nasal cannula and attempted screening. Patient is 62% on RA. With regular [place] patient is 88% on 6LNC. MD notified of patient's hypoxia, discharge order was cancelled.

## 2019-05-19 NOTE — TOC Progression Note (Signed)
Transition of Care [facility]) - Progression Note    Patient Details  Name: Andre [name] MRN: [id number] Date of Birth: Jul 18, 1923  [facility] of [facility]) CM/SW [place], LCSW Phone Number: 05/19/2019, 11:59 AM  Clinical Narrative:   CSW assisting patient will discharge plan. CSW spoke with patients daughter Mechele Claude via phone. Mechele Claude stated that she would like patient to return home with Home Health. Mechele Claude stated she would prefer [facility] since her parents have had them before in the past. Mechele Claude stated she will pick patient up from the hospital around 4pm, CSW made RN aware of plan.  CSW spoke with [facility] Beverlee Nims and she stated she will set patient up with [facility] and oxygen for discharge.     Expected Discharge Plan: Skilled Nursing Facility Barriers to Discharge: Continued Medical Work up  Expected Discharge Plan and Services Expected Discharge Plan: [place] In-house Referral: Clinical Social Work Discharge Planning Services: CM Consult   Living arrangements for the past 2 months: Single Family Home(however has been in and out of hospital and SNF for past 3 months) Expected Discharge Date: 05/19/19                                     Social Determinants of Health (SDOH) Interventions    Readmission Risk Interventions Readmission Risk Prevention Plan 03/08/2019  Transportation Screening Complete  PCP or Specialist Appt within 5-7 Days Complete  Home Care Screening Complete  Medication Review (RN CM) Complete  Some recent data might be hidden

## 2019-05-19 NOTE — TOC Progression Note (Signed)
Transition of Care [facility]) - Progression Note    Patient Details  Name: JONPAUL LUMM MRN: [id number] Date of Birth: Dec 31, 1922  Transition of Care [facility]) CM/SW Contact  Joaquin Courts, RN Phone Number: 05/19/2019, 1:09 PM  Clinical Narrative:   CM received call from bedside RN, patient will not be discharging today.    Expected Discharge Plan: Skilled Nursing Facility Barriers to Discharge: Continued Medical Work up  Expected Discharge Plan and Services Expected Discharge Plan: [place] In-house Referral: Clinical Social Work Discharge Planning Services: CM Consult   Living arrangements for the past 2 months: Single Family Home(however has been in and out of hospital and SNF for past 3 months) Expected Discharge Date: 05/19/19                                     Social Determinants of Health (SDOH) Interventions    Readmission Risk Interventions Readmission Risk Prevention Plan 03/08/2019  Transportation Screening Complete  PCP or Specialist Appt within 5-7 Days Complete  Home Care Screening Complete  Medication Review (RN CM) Complete  Some recent data might be hidden

## 2019-05-19 NOTE — Progress Notes (Signed)
I was reported that, his oxygenation dropped on 4 L oxygen per minute.  Discharge planning canceled.  We will continue to monitor him here continue current treatment.  Cardiology will keep on following

## 2019-05-19 NOTE — Progress Notes (Signed)
Home health agencies that serve [phone].        [place] Quality of Patient Care Rating Patient Survey Summary Rating  [facility] [phone] 3 out of 5 stars 5 out of [place] [phone] 3 out of 5 stars 4 out of [place] [phone] 4  out of 5 stars 3 out of [place] [phone] 4 out of 5 stars 4 out of [place] [phone] 4 out of 5 stars 4 out of 5 stars  [facility] [phone] 3  out of 5 stars 4 out of [place] [phone] 3 out of 5 stars 4 out of 5 stars  [facility] [phone] 3  out of 5 stars 3 out of [place] [phone] 3  out of 5 stars 3 out of [place] [phone] 4  out of 5 stars 3 out of [place] number Footnote as displayed on [place]  1 This agency provides services under a federal waiver program to non-traditional, chronic long term population.  2 This agency provides services to a special needs population.  3 Not Available.  4 The number of patient episodes for this measure is too small to report.  5 This measure currently does not have data or provider has been certified/recertified for less than 6 months.  6 The national average for this measure is not provided because of state-to-state differences in data collection.  7 Medicare is not displaying rates for this measure for any home health agency, because of an issue with the data.  8 There were problems with the data and they are being corrected.  9 Zero, or very few, patients met the survey's rules for inclusion. The scores shown, if any, reflect a very small number of surveys and may not accurately tell how an  agency is doing.  10 Survey results are based on less than 12 months of data.  11 Fewer than 70 patients completed the survey. Use the scores shown, if any, with caution as the number of surveys may be too low to accurately tell how an agency is doing.  12 No survey results are available for this period.  13 Data suppressed by CMS for one or more quarters.

## 2019-05-19 NOTE — Discharge Summary (Addendum)
Physician Discharge Summary  Andre [name] GDJ:[id number] DOB: 09-16-1923 DOA: 05/15/2019  PCP: Rosaria Ferries, MD  Admit date: 05/15/2019 Discharge date: 05/21/19 Admitted From: Home Disposition:  Home  Discharge Condition:Stable CODE STATUS:FULL Diet recommendation: Heart Healthy   Brief/Interim Summary: Patient is a 85 year old male with history of complete heart block status post pacemaker placement, hyperlipidemia, hypertension, chronic diastolic CHF, CKD stage III, atrial fibrillation who presents to the emergency department from SNF with complaints of worsening shortness of breath.  Patient is on oxygen at 4 L/min on baseline.  He was admitted in April with congestive heart failure requiring intubation.  On arrival he was hypoxic satting in the range of 50s.  Started on BiPAP.  Cardiology consulted and was following.  Started on IV diuresis. His respiratory status has continued to improve with diuresis.   He has been weaned off BiPAP and currently saturating fine on 4 L oxygen per minute which is his baseline. He is hemodynamically stable for discharge to home.  Patient was initially recommended discussing facility on discharge but he wants to go home with home health.  Home health will be arranged.  Following problems were addressed during his hospitalization:  Acute on chronic respiratory failure with hypoxia: Secondary to CHF exacerbation.  Initial imaging did not show any pneumonia.  Antibiotics discontinued.  Started on BiPAP.Now off Bipap and on nasal cannula at 4L/min.    Acute on chronic diastolic CHF: Echocardiogram on 3/20 shows ejection fraction of 60 to 65%. Started diuresis with IV Lasix.  Cardiology following.  Has crackles bilaterally on the bases.  No peripheral edema. Mildly elevated troponin secondary to CHF. He will be discharged on Lasix 20 mg daily at home  Acute on chronic kidney disease stage III: Baseline creatinine ranges from 1.5-2.Currently kidney  function on baseline.  Continue to monitor as an outpatient  UTI: Urine culture showed ENTEROBACTER HORMAECHEI.   Patient denies any dysuria.  Antibiotics changed to oral.  Atrial flutter/fibrillation:CHA2DS2 vas score 4.  Currently in sinus with paced rhythm.  Continue amiodarone.  On Eliquis for anticoagulation.  Complete heart block: Status post pacemaker.  Follows with cardiology  Hypertension: Currently blood pressure stable. Continue home meds.  Hyperlipidemia: Continue home medications  Deconditioning/debility: Physical therapy/Occupational Therapy consulted.Recommended SNF.  Patient wants to go home with home health.    Discharge Diagnoses:  Active Problems:   Hyperlipidemia   Essential hypertension   Acute-on-chronic kidney disease stage III   Atrial fibrillation/flutter   CHB (complete heart block) (HCC)   Pressure injury of skin   Pacemaker   Hypoxia   Acute on chronic respiratory failure with hypoxia (HCC)   Chronic atrial fibrillation   Acute exacerbation of CHF (congestive heart failure) (HCC)   Hypoalbuminemia   Acute on chronic heart failure (HCC)   Acute on chronic diastolic CHF (congestive heart failure) (HCC)   Acute on chronic diastolic CHF (congestive heart failure) (HCC)   Hypokalemia    Discharge Instructions  Discharge Instructions    Diet - low sodium heart healthy   Complete by:  As directed    Discharge instructions   Complete by:  As directed    1)Please follow-up with your PCP in a week.  Do a CBC, BMP test during the follow-up 2)Take prescribed medications as instructed. 3) You will be called by cardiology for appointment.   Increase activity slowly   Complete by:  As directed      Allergies as of 05/21/2019   No Known Allergies  Medication List    STOP taking these medications   feeding supplement (PRO-STAT SUGAR FREE 64) Liqd   isosorbide mononitrate 120 MG 24 hr tablet Commonly known as:  IMDUR   levalbuterol 0.63  MG/3ML nebulizer solution Commonly known as:  XOPENEX   NEPHRO-VITE PO   Rivaroxaban 15 MG Tabs tablet Commonly known as:  Xarelto     TAKE these medications   albuterol 108 (90 Base) MCG/ACT inhaler Commonly known as:  VENTOLIN HFA Inhale 1 puff into the lungs every 4 (four) hours as needed for wheezing or shortness of breath.   amiodarone 200 MG tablet Commonly known as:  Pacerone Take 1 tablet (200 mg total) by mouth daily. What changed:  Another medication with the same name was removed. Continue taking this medication, and follow the directions you see here.   amLODipine 5 MG tablet Commonly known as:  NORVASC Take 5 mg by mouth daily. What changed:  Another medication with the same name was removed. Continue taking this medication, and follow the directions you see here.   apixaban 2.5 MG Tabs tablet Commonly known as:  Eliquis Take 1 tablet (2.5 mg total) by mouth 2 (two) times daily.   atorvastatin 10 MG tablet Commonly known as:  LIPITOR Take 1 tablet (10 mg total) by mouth daily.   ciprofloxacin 500 MG tablet Commonly known as:  Cipro Take 1 tablet (500 mg total) by mouth 2 (two) times daily for 3 days.   docusate sodium 100 MG capsule Commonly known as:  COLACE Take 1 capsule (100 mg total) by mouth 2 (two) times daily.   famotidine 20 MG tablet Commonly known as:  PEPCID Take 1 tablet (20 mg total) by mouth daily.   furosemide 20 MG tablet Commonly known as:  Lasix Take 1 tablet (20 mg total) by mouth daily for 30 days.   ipratropium-albuterol 0.5-2.5 (3) MG/3ML Soln Commonly known as:  DUONEB Take 3 mLs by nebulization every 4 (four) hours as needed (shortness of breath).   Melatonin 3 MG Tabs Take 2 tablets (6 mg total) by mouth at bedtime.   mirtazapine 7.5 MG tablet Commonly known as:  REMERON Take 1 tablet (7.5 mg total) by mouth at bedtime.   polyethylene glycol 17 g packet Commonly known as:  MIRALAX / GLYCOLAX Take 17 g by mouth daily  as needed. What changed:    when to take this  reasons to take this   Potassium Chloride ER 20 MEQ Tbcr Take 20 mEq by mouth daily.            Durable Medical Equipment  (From admission, onward)         Start     Ordered   05/21/19 1213  For home use only DME Nebulizer machine  Once    Question:  Patient needs a nebulizer to treat with the following condition  Answer:  CHF (congestive heart failure) ([place])   05/21/19 1212   05/19/19 1216  For home use only DME oxygen  Once    Comments:  Please evaluate for light weight portable concentrator.  Question Answer Comment  Length of Need Lifetime   Mode or (Route) Nasal cannula   Liters per Minute 4   Frequency Continuous (stationary and portable oxygen unit needed)   Oxygen delivery system Gas      05/19/19 1215         Follow-up Information    Rosaria Ferries, MD. Schedule an appointment as soon as possible for  a visit in 1 week(s).   Contact information: [place] [id number] [phone]        [facility] Follow up.   Why:  agency will contact you to set up initial visit time. agency will provide home health nurse and physical therapy. Contact information: [facility] Follow up.   Why:  Home oxygen Contact information: [place] [address] [phone]          No Known Allergies  Consultations:  Cardiology   Procedures/Studies: Dg Chest Port 1 View  Result Date: 05/15/2019 CLINICAL DATA:  Dyspnea with exertion. EXAM: PORTABLE CHEST 1 VIEW COMPARISON:  05/01/2019 FINDINGS: The heart is enlarged. BILATERAL pulmonary opacities with effusions most consistent with pulmonary edema. Unchanged dual lead pacer. Worsening aeration. IMPRESSION: Worsening aeration. BILATERAL pulmonary opacities with effusions consistent with pulmonary edema. Electronically Signed   By: Staci Righter M.D.   On: 05/15/2019 18:15       Subjective: Patient seen and examined the bedside this morning.  Comfortable and hemodynamically stable.  Does not have any peripheral edema.  Had some mild crackles on bilateral bases.  Stable for discharge   Discharge Exam: Vitals:   05/21/19 0620 05/21/19 0935  BP: (!) 130/53 124/68  Pulse: 74 72  Resp: 20   Temp: 97.7 F (36.5 C)   SpO2: 94% 95%   Vitals:   05/20/19 1445 05/20/19 2118 05/21/19 0620 05/21/19 0935  BP: 129/65 (!) 129/56 (!) 130/53 124/68  Pulse: 75 77 74 72  Resp: 16 20 20    Temp: 98.1 F (36.7 C) 98.6 F (37 C) 97.7 F (36.5 C)   TempSrc: Oral Oral Oral   SpO2: 97% 99% 94% 95%  Weight:   57.5 kg   Height:        General: Pt is alert, awake, not in acute distress Cardiovascular: RRR, S1/S2 +, no rubs, no gallops Respiratory: Bilateral basal crackles Abdominal: Soft, NT, ND, bowel sounds + Extremities: no edema, no cyanosis    The results of significant diagnostics from this hospitalization (including imaging, microbiology, ancillary and laboratory) are listed below for reference.     Microbiology: Recent Results (from the past 240 hour(s))  Culture, blood (routine x 2)     Status: None   Collection Time: 05/15/19  4:20 PM  Result Value Ref Range Status   Specimen Description   Final    BLOOD LEFT ANTECUBITAL Performed at [facility] [address], [place], [place] [address]    Special Requests   Final    BOTTLES DRAWN AEROBIC AND ANAEROBIC Blood Culture adequate volume Performed at [facility] [address] [place], [place] [address]    Culture   Final    NO GROWTH 5 DAYS Performed at [facility], [place] [address] [place], [place] [address]    Report Status 05/20/2019 FINAL  Final  SARS Coronavirus 2 (CEPHEID- Performed in [facility] hospital lab), Hosp Order     Status: None   Collection Time: 05/15/19  4:25 PM  Result Value Ref Range Status   SARS Coronavirus 2 NEGATIVE  NEGATIVE Final    Comment: (NOTE) If result is NEGATIVE SARS-CoV-2 target nucleic acids are NOT DETECTED. The SARS-CoV-2 RNA is generally detectable in upper and lower  respiratory specimens during the acute phase of infection. The lowest  concentration of SARS-CoV-2 viral copies this assay can detect is 250  copies / mL. A negative result does not preclude SARS-CoV-2 infection  and should not be used as the sole basis for treatment or other  patient management decisions.  A negative result may occur with  improper specimen collection / handling, submission of specimen other  than nasopharyngeal swab, presence of viral mutation(s) within the  areas targeted by this assay, and inadequate number of viral copies  (<250 copies / mL). A negative result must be combined with clinical  observations, patient history, and epidemiological information. If result is POSITIVE SARS-CoV-2 target nucleic acids are DETECTED. The SARS-CoV-2 RNA is generally detectable in upper and lower  respiratory specimens dur ing the acute phase of infection.  Positive  results are indicative of active infection with SARS-CoV-2.  Clinical  correlation with patient history and other diagnostic information is  necessary to determine patient infection status.  Positive results do  not rule out bacterial infection or co-infection with other viruses. If result is PRESUMPTIVE POSTIVE SARS-CoV-2 nucleic acids MAY BE PRESENT.   A presumptive positive result was obtained on the submitted specimen  and confirmed on repeat testing.  While 2019 novel coronavirus  (SARS-CoV-2) nucleic acids may be present in the submitted sample  additional confirmatory testing may be necessary for epidemiological  and / or clinical management purposes  to differentiate between  SARS-CoV-2 and other Sarbecovirus currently known to infect humans.  If clinically indicated additional testing with an alternate test  methodology [phone]) is  advised. The SARS-CoV-2 RNA is generally  detectable in upper and lower respiratory sp ecimens during the acute  phase of infection. The expected result is Negative. Fact Sheet for Patients:  StrictlyIdeas.no Fact Sheet for Healthcare Providers: BankingDealers.co.za This test is not yet approved or cleared by the [place] FDA and has been authorized for detection and/or diagnosis of SARS-CoV-2 by FDA under an Emergency Use Authorization (EUA).  This EUA will remain in effect (meaning this test can be used) for the duration of the COVID-19 declaration under Section 564(b)(1) of the Act, 21 U.S.C. section 360bbb-3(b)(1), unless the authorization is terminated or revoked sooner. Performed at [facility], [place] [address], [place], [place] [address]   Culture, blood (routine x 2)     Status: None   Collection Time: 05/15/19  4:47 PM  Result Value Ref Range Status   Specimen Description   Final    BLOOD RIGHT HAND Performed at [facility] [address], [place], [place] [address]    Special Requests   Final    BOTTLES DRAWN AEROBIC AND ANAEROBIC Blood Culture adequate volume Performed at [facility] [address], [place], [place] [address]    Culture   Final    NO GROWTH 5 DAYS Performed at [facility], [place] [address], [place], [place] [address]    Report Status 05/20/2019 FINAL  Final  Urine Culture     Status: Abnormal   Collection Time: 05/15/19  9:50 PM  Result Value Ref Range Status   Specimen Description   Final    URINE, CLEAN CATCH Performed at [facility], [place] [address] [place], [place] [address]    Special Requests   Final    NONE Performed at [facility], [place] [address] [place], [place] [address]    Culture >=100,000 COLONIES/mL ENTEROBACTER HORMAECHEI (A)  Final   Report Status 05/19/2019 FINAL  Final    Organism ID, Bacteria ENTEROBACTER HORMAECHEI (A)  Final  Susceptibility   Enterobacter hormaechei - MIC*    CEFAZOLIN >=64 RESISTANT Resistant     CEFTRIAXONE <=1 SENSITIVE Sensitive     CIPROFLOXACIN <=0.25 SENSITIVE Sensitive     GENTAMICIN <=1 SENSITIVE Sensitive     IMIPENEM <=0.25 SENSITIVE Sensitive     NITROFURANTOIN 32 SENSITIVE Sensitive     TRIMETH/SULFA <=20 SENSITIVE Sensitive     PIP/TAZO <=4 SENSITIVE Sensitive     * >=100,000 COLONIES/mL ENTEROBACTER HORMAECHEI  MRSA PCR Screening     Status: None   Collection Time: 05/15/19 10:35 PM  Result Value Ref Range Status   MRSA by PCR NEGATIVE NEGATIVE Final    Comment:        The GeneXpert MRSA Assay (FDA approved for NASAL specimens only), is one component of a comprehensive MRSA colonization surveillance program. It is not intended to diagnose MRSA infection nor to guide or monitor treatment for MRSA infections. Performed at [facility], [place] [address] [place], [place] [address]      Labs: BNP (last 3 results) Recent Labs    02/23/19 0833 05/15/19 1630  BNP 1,628.2* 3,299.2*   Basic Metabolic Panel: Recent Labs  Lab 05/15/19 1626 05/16/19 0302 05/17/19 0929 05/19/19 0515 05/21/19 0446  NA 137 140 136 138 136  K 4.0 3.6 3.3* 3.7 4.1  CL 102 103 97* 99 98  CO2 26 28 29 31  32  GLUCOSE 138* 103* 146* 118* 105*  BUN 21 19 18 18 23   CREATININE 1.63* 1.45* 1.36* 1.17 1.49*  CALCIUM 8.2* 8.2* 8.3* 8.5* 8.3*  MG  --  2.0  --   --   --   PHOS  --  4.6  --   --   --    Liver Function Tests: Recent Labs  Lab 05/15/19 2046 05/16/19 0302  AST 16 15  ALT 19 19  ALKPHOS 64 66  BILITOT 0.2* 0.3  PROT 6.3* 6.4*  ALBUMIN 2.2* 2.3*   No results for input(s): LIPASE, AMYLASE in the last 168 hours. No results for input(s): AMMONIA in the last 168 hours. CBC: Recent Labs  Lab 05/15/19 1626 05/16/19 0302 05/20/19 0454  WBC 10.7* 10.3 12.0*  NEUTROABS 7.4  --   --   HGB  8.0* 8.2* 8.4*  HCT 26.6* 27.7* 28.8*  MCV 89.3 89.1 89.2  PLT 382 372 537*   Cardiac Enzymes: Recent Labs  Lab 05/15/19 1855 05/15/19 2046 05/16/19 0302 05/16/19 0910  TROPONINI 0.04* 0.03* 0.03* 0.04*   BNP: Invalid input(s): POCBNP CBG: No results for input(s): GLUCAP in the last 168 hours. D-Dimer No results for input(s): DDIMER in the last 72 hours. Hgb A1c No results for input(s): HGBA1C in the last 72 hours. Lipid Profile No results for input(s): CHOL, HDL, LDLCALC, TRIG, CHOLHDL, LDLDIRECT in the last 72 hours. Thyroid function studies No results for input(s): TSH, T4TOTAL, T3FREE, THYROIDAB in the last 72 hours.  Invalid input(s): FREET3 Anemia work up No results for input(s): VITAMINB12, FOLATE, FERRITIN, TIBC, IRON, RETICCTPCT in the last 72 hours. Urinalysis    Component Value Date/Time   COLORURINE STRAW (A) 05/15/2019 2150   APPEARANCEUR HAZY (A) 05/15/2019 2150   LABSPEC 1.005 05/15/2019 2150   PHURINE 6.0 05/15/2019 2150   GLUCOSEU NEGATIVE 05/15/2019 2150   HGBUR NEGATIVE 05/15/2019 2150   BILIRUBINUR NEGATIVE 05/15/2019 2150   BILIRUBINUR Negative 12/11/2018 [place] 05/15/2019 2150   PROTEINUR NEGATIVE 05/15/2019 2150   UROBILINOGEN 0.2 12/11/2018 0947   UROBILINOGEN  0.2 12/08/2010 0639   NITRITE NEGATIVE 05/15/2019 2150   LEUKOCYTESUR MODERATE (A) 05/15/2019 2150   Sepsis Labs Invalid input(s): PROCALCITONIN,  WBC,  LACTICIDVEN Microbiology Recent Results (from the past 240 hour(s))  Culture, blood (routine x 2)     Status: None   Collection Time: 05/15/19  4:20 PM  Result Value Ref Range Status   Specimen Description   Final    BLOOD LEFT ANTECUBITAL Performed at [facility] [address], [place], [place] [address]    Special Requests   Final    BOTTLES DRAWN AEROBIC AND ANAEROBIC Blood Culture adequate volume Performed at [facility] [address], [place], [place]  [address]    Culture   Final    NO GROWTH 5 DAYS Performed at [facility], [place] [address], [place], [place] [address]    Report Status 05/20/2019 FINAL  Final  SARS Coronavirus 2 (CEPHEID- Performed in [facility] hospital lab), Hosp Order     Status: None   Collection Time: 05/15/19  4:25 PM  Result Value Ref Range Status   SARS Coronavirus 2 NEGATIVE NEGATIVE Final    Comment: (NOTE) If result is NEGATIVE SARS-CoV-2 target nucleic acids are NOT DETECTED. The SARS-CoV-2 RNA is generally detectable in upper and lower  respiratory specimens during the acute phase of infection. The lowest  concentration of SARS-CoV-2 viral copies this assay can detect is 250  copies / mL. A negative result does not preclude SARS-CoV-2 infection  and should not be used as the sole basis for treatment or other  patient management decisions.  A negative result may occur with  improper specimen collection / handling, submission of specimen other  than nasopharyngeal swab, presence of viral mutation(s) within the  areas targeted by this assay, and inadequate number of viral copies  (<250 copies / mL). A negative result must be combined with clinical  observations, patient history, and epidemiological information. If result is POSITIVE SARS-CoV-2 target nucleic acids are DETECTED. The SARS-CoV-2 RNA is generally detectable in upper and lower  respiratory specimens dur ing the acute phase of infection.  Positive  results are indicative of active infection with SARS-CoV-2.  Clinical  correlation with patient history and other diagnostic information is  necessary to determine patient infection status.  Positive results do  not rule out bacterial infection or co-infection with other viruses. If result is PRESUMPTIVE POSTIVE SARS-CoV-2 nucleic acids MAY BE PRESENT.   A presumptive positive result was obtained on the submitted specimen  and confirmed on repeat testing.  While 2019 novel coronavirus   (SARS-CoV-2) nucleic acids may be present in the submitted sample  additional confirmatory testing may be necessary for epidemiological  and / or clinical management purposes  to differentiate between  SARS-CoV-2 and other Sarbecovirus currently known to infect humans.  If clinically indicated additional testing with an alternate test  methodology [phone]) is advised. The SARS-CoV-2 RNA is generally  detectable in upper and lower respiratory sp ecimens during the acute  phase of infection. The expected result is Negative. Fact Sheet for Patients:  StrictlyIdeas.no Fact Sheet for Healthcare Providers: BankingDealers.co.za This test is not yet approved or cleared by the [place] FDA and has been authorized for detection and/or diagnosis of SARS-CoV-2 by FDA under an Emergency Use Authorization (EUA).  This EUA will remain in effect (meaning this test can be used) for the duration of the COVID-19 declaration under Section 564(b)(1) of the Act, 21 U.S.C. section  360bbb-3(b)(1), unless the authorization is terminated or revoked sooner. Performed at [facility], [place] [address], [place], [place] [address]   Culture, blood (routine x 2)     Status: None   Collection Time: 05/15/19  4:47 PM  Result Value Ref Range Status   Specimen Description   Final    BLOOD RIGHT HAND Performed at [facility] [address] [place], [place] [address]    Special Requests   Final    BOTTLES DRAWN AEROBIC AND ANAEROBIC Blood Culture adequate volume Performed at [facility] [address] [place], [place] [address]    Culture   Final    NO GROWTH 5 DAYS Performed at [facility], [place] [address] [place], [place] [address]    Report Status 05/20/2019 FINAL  Final  Urine Culture     Status: Abnormal   Collection Time: 05/15/19  9:50 PM  Result Value Ref Range Status   Specimen  Description   Final    URINE, CLEAN CATCH Performed at [facility], [place] [address] [place], [place] [address]    Special Requests   Final    NONE Performed at [facility], [place] [address] [place], [place] [address]    Culture >=100,000 COLONIES/mL ENTEROBACTER HORMAECHEI (A)  Final   Report Status 05/19/2019 FINAL  Final   Organism ID, Bacteria ENTEROBACTER HORMAECHEI (A)  Final      Susceptibility   Enterobacter hormaechei - MIC*    CEFAZOLIN >=64 RESISTANT Resistant     CEFTRIAXONE <=1 SENSITIVE Sensitive     CIPROFLOXACIN <=0.25 SENSITIVE Sensitive     GENTAMICIN <=1 SENSITIVE Sensitive     IMIPENEM <=0.25 SENSITIVE Sensitive     NITROFURANTOIN 32 SENSITIVE Sensitive     TRIMETH/SULFA <=20 SENSITIVE Sensitive     PIP/TAZO <=4 SENSITIVE Sensitive     * >=100,000 COLONIES/mL ENTEROBACTER HORMAECHEI  MRSA PCR Screening     Status: None   Collection Time: 05/15/19 10:35 PM  Result Value Ref Range Status   MRSA by PCR NEGATIVE NEGATIVE Final    Comment:        The GeneXpert MRSA Assay (FDA approved for NASAL specimens only), is one component of a comprehensive MRSA colonization surveillance program. It is not intended to diagnose MRSA infection nor to guide or monitor treatment for MRSA infections. Performed at [facility], [place] [address] [place], [place] [address]     Please note: You were cared for by a hospitalist during your hospital stay. Once you are discharged, your primary care physician will handle any further medical issues. Please note that NO REFILLS for any discharge medications will be authorized once you are discharged, as it is imperative that you return to your primary care physician (or establish a relationship with a primary care physician if you do not have one) for your post hospital discharge needs so that they can reassess your need for medications and monitor your lab values.    Time  coordinating discharge: 40 minutes  SIGNED:   Shelly Coss, MD  [facility] 05/21/2019, 12:12 PM Pager [phone]  If 7PM-7AM, please contact night-coverage [email or web address] Password [email or web address]

## 2019-05-19 NOTE — Progress Notes (Deleted)
SATURATION QUALIFICATIONS: (This note is used to comply with regulatory documentation for home oxygen)  Patient Saturations on Room Air at Rest = 62%  Patient Saturations on Room Air while Ambulating = N/A  Patient Saturations on 8 Liters of oxygen while Ambulating = 90%  Please briefly explain why patient needs home oxygen: Patient is hypoxic on room air.  Patient requires 4L [place] at rest and 8L [place] while ambulating.

## 2019-05-19 NOTE — TOC Progression Note (Signed)
Transition of Care [facility]) - Progression Note    Patient Details  Name: [name] MRN: [id number] Date of Birth: [birth year]  Transition of Care [facility]) CM/SW Contact  Joaquin Courts, RN Phone Number: 05/19/2019, 12:03 PM  Clinical Narrative:    CM arranged Home health PT/RN through [facility], rep [facility] notified of referral.    Expected Discharge Plan: Skilled Nursing Facility Barriers to Discharge: Continued Medical Work up  Expected Discharge Plan and Services Expected Discharge Plan: [place] In-house Referral: Clinical Social Work Discharge Planning Services: CM Consult   Living arrangements for the past 2 months: Single Family Home(however has been in and out of hospital and SNF for past 3 months) Expected Discharge Date: 05/19/19                                     Social Determinants of Health (SDOH) Interventions    Readmission Risk Interventions Readmission Risk Prevention Plan 03/08/2019  Transportation Screening Complete  PCP or Specialist Appt within 5-7 Days Complete  Home Care Screening Complete  Medication Review (RN CM) Complete  Some recent data might be hidden

## 2019-05-19 NOTE — Progress Notes (Signed)
Pt has refused BIPAP QHS, RN aware.  RT to monitor and assess as needed.

## 2019-05-20 LAB — CULTURE, BLOOD (ROUTINE X 2)
Culture: NO GROWTH
Culture: NO GROWTH
Special Requests: ADEQUATE
Special Requests: ADEQUATE

## 2019-05-20 LAB — CBC
HCT: 28.8 % — ABNORMAL LOW (ref 39.0–52.0)
Hemoglobin: 8.4 g/dL — ABNORMAL LOW (ref 13.0–17.0)
MCH: 26 pg (ref 26.0–34.0)
MCHC: 29.2 g/dL — ABNORMAL LOW (ref 30.0–36.0)
MCV: 89.2 fL (ref 80.0–100.0)
Platelets: 537 10*3/uL — ABNORMAL HIGH (ref 150–400)
RBC: 3.23 MIL/uL — ABNORMAL LOW (ref 4.22–5.81)
RDW: 16.4 % — ABNORMAL HIGH (ref 11.5–15.5)
WBC: 12 10*3/uL — ABNORMAL HIGH (ref 4.0–10.5)
nRBC: 0 % (ref 0.0–0.2)

## 2019-05-20 MED ORDER — DIPHENHYDRAMINE HCL 25 MG PO CAPS: 25.0000 mg | ORAL_CAPSULE | Freq: Every evening | ORAL | Status: DC | PRN

## 2019-05-20 MED ORDER — FUROSEMIDE 10 MG/ML IJ SOLN
40.0000 mg | Freq: Two times a day (BID) | INTRAMUSCULAR | Status: DC
  Administered 2019-05-20 – 2019-05-21 (×2): 40 mg via INTRAVENOUS
  Filled 2019-05-20 (×2): qty 4

## 2019-05-20 MED ORDER — MELATONIN 3 MG PO TABS
3.0000 mg | ORAL_TABLET | Freq: Every evening | ORAL | Status: DC | PRN
  Administered 2019-05-20: 3 mg via ORAL
  Filled 2019-05-20 (×2): qty 1

## 2019-05-20 NOTE — Progress Notes (Signed)
Physical Therapy Treatment Patient Details Name: Andre [name] MRN: [id number] DOB: Feb 11, 1923 Today's Date: 05/20/2019     SATURATION QUALIFICATIONS: (This note is used to comply with regulatory documentation for home oxygen)  Patient Saturations on Room Air at Rest = 85%  Patient Saturations on [employer] while Ambulating = n/a  Patient Saturations on 3;4 Liters of oxygen while Ambulating = 75%;87%     History of Present Illness 85 year old male with history of complete heart block status post pacemaker placement, hyperlipidemia, hypertension, chronic diastolic CHF, CKD stage III, atrial fibrillation who was admitted  from [facility] with complaints of worsening shortness of breath, tested COvid negative and facility without any positive cases.   Patient is on oxygen at 4 L/min at home.  He was admitted in April with congestive heart failure requiring intubation.  On arrival he was hypoxic satting in the range of 50s.  Started on BiPAP.  Cardiology consulted and following.     PT Comments    Progressing slowly with mobility.    Follow Up Recommendations  Home health PT;Supervision/Assistance - 24 hour     Equipment Recommendations  (may need 4 wheeled walker)    Recommendations for Other Services       Precautions / Restrictions Precautions Precautions: Fall Precaution Comments: monitor O2 sats-requiring O2  Restrictions Weight Bearing Restrictions: No    Mobility  Bed Mobility Overal bed mobility: Needs Assistance Bed Mobility: Supine to Sit     Supine to sit: Supervision     General bed mobility comments: for safety, lines.   Transfers Overall transfer level: Needs assistance Equipment used: Rolling walker (2 wheeled) Transfers: Sit to/from Stand Sit to Stand: Min guard         General transfer comment: Close guard for safety. VCs safety,hand placement.   Ambulation/Gait Ambulation/Gait assistance: Min assist Gait Distance (Feet): 75  Feet Assistive device: Rolling walker (2 wheeled) Gait Pattern/deviations: Step-through pattern     General Gait Details: Assist for safety/equipment. Cues for pacing, pursed lip/deep breathing. Pt fatigues easily. Distance is limited by dyspnea, drop in sats   Stairs             Wheelchair Mobility    Modified Rankin (Stroke Patients Only)       Balance                                            Cognition Arousal/Alertness: Awake/alert Behavior During Therapy: WFL for tasks assessed/performed Overall Cognitive Status: Within Functional Limits for tasks assessed                                        Exercises      General Comments        Pertinent Vitals/Pain Pain Assessment: No/denies pain    Home Living                      Prior Function            PT Goals (current goals can now be found in the care plan section) Progress towards PT goals: Progressing toward goals    Frequency    Min 3X/week      PT Plan Current plan remains appropriate    Co-evaluation  AM-PAC PT "6 Clicks" Mobility   Outcome Measure  Help needed turning from your back to your side while in a flat bed without using bedrails?: A Little Help needed moving from lying on your back to sitting on the side of a flat bed without using bedrails?: A Little Help needed moving to and from a bed to a chair (including a wheelchair)?: A Little Help needed standing up from a chair using your arms (e.g., wheelchair or bedside chair)?: A Little Help needed to walk in hospital room?: A Little Help needed climbing 3-5 steps with a railing? : A Little 6 Click Score: 18    End of Session Equipment Utilized During Treatment: Gait belt;Oxygen Activity Tolerance: Patient limited by fatigue Patient left: with call bell/phone within reach;in chair   PT Visit Diagnosis: Unsteadiness on feet (R26.81);Difficulty in walking, not  elsewhere classified (R26.2)     Time: 1552-0802 PT Time Calculation (min) (ACUTE ONLY): 28 min  Charges:  $Gait Training: 23-37 mins                        Weston Anna, PT Acute Rehabilitation Services Pager: [phone] Office: [phone]

## 2019-05-20 NOTE — Care Management Important Message (Signed)
Important Message  Patient Details IM Letter given to Nancy Marus RN to present to the Patient Name: MASSIAH MINJARES MRN: [id number] Date of Birth: 1923/10/26   Medicare Important Message Given:  Yes    Kerin Salen 05/20/2019, 11:15 AMImportant Message  Patient Details  Name: DONNY HEFFERN MRN: [id number] Date of Birth: May 22, 1923   Medicare Important Message Given:  Yes    Kerin Salen 05/20/2019, 11:15 AM

## 2019-05-20 NOTE — Progress Notes (Signed)
PROGRESS NOTE    Andre [name]  KDT:[id number] DOB: May 06, 1923 DOA: 05/15/2019 PCP: Rosaria Ferries, MD   Brief Narrative: Patient is a 85 year old male with history of complete heart block status post pacemaker placement, hyperlipidemia, hypertension, chronic diastolic CHF, CKD stage III, atrial fibrillation who presents to the emergency department from SNF with complaints of worsening shortness of breath.  Patient is on oxygen at 4 L/min at home.  He was admitted in April with congestive heart failure requiring intubation.  On arrival he was hypoxic satting in the range of 50s.  Started on BiPAP.  Cardiology consulted and following.  Started on IV diuresis. His respiratory status has continued to improve.  Currently he will be continued on IV diuresis.  He has been weaned off BiPAP . There was plan for discharge yesterday to home but he rapidly desaturated on 4 L of oxygen per minute.  He is saturation dropped to 70% on ambulation and requires 5 L of oxygen to maintain saturation at 90%.  On 3 L of oxygen per minute at rest. Continue IV diuresis for today.  Plan for discharge tomorrow.  Assessment & Plan:   Active Problems:   Hyperlipidemia   Essential hypertension   Acute-on-chronic kidney disease stage III   Atrial fibrillation/flutter   CHB (complete heart block) (HCC)   Pressure injury of skin   Pacemaker   Hypoxia   Acute on chronic respiratory failure with hypoxia (HCC)   Chronic atrial fibrillation   Acute exacerbation of CHF (congestive heart failure) (HCC)   Hypoalbuminemia   Acute on chronic heart failure (HCC)   Acute on chronic diastolic CHF (congestive heart failure) (HCC)   Acute on chronic diastolic CHF (congestive heart failure) (HCC)   Hypokalemia   Acute on chronic respiratory failure with hypoxia: Secondary to CHF exacerbation.  Initial imaging did not show any pneumonia.  Antibiotics discontinued.  Started on BiPAP.Now off Bipap and on nasal cannula  at 4L/min.  Desaturates on ambulation.  Continue diuresis for today and anticipate discharge tomorrow.  Acute on chronic diastolic CHF: Echocardiogram on 3/20 shows ejection fraction of 60 to 65%.  Continue diuresis with IV Lasix.  Cardiology was  following.  Has crackles bilaterally on the bases.  No peripheral edema. Mildly elevated troponin secondary to CHF.  Acute on chronic kidney disease stage III: Baseline creatinine ranges from 1.5-2.Currently kidney function has definitely improved..  Continue to monitor  UTI: Urine culture grew Enterobacter .  Continue ciprofloxacin.  Atrial flutter/fibrillation:CHA2DS2 vas score 4.  Currently in sinus with paced rhythm.  Continue amiodarone.  On Eliquis for anticoagulation.  Complete heart block: Status post pacemaker.  Follows with cardiology  Hypertension: Currently blood pressure stable.  Amlodipine held  Hyperlipidemia: Continue home medications  Deconditioning/debility: Physical therapy/Occupational Therapy consulted.Recommended SNF. Social worker consulted.  Patient and family want home with home health         DVT prophylaxis:Eliquis Code Status: Full Family Communication: Discussed with daughter on phone on 05/19/19 Disposition Plan: Home tomorrow   Consultants: Cardiology  Procedures: None  Antimicrobials:  Anti-infectives (From admission, onward)   Start     Dose/Rate Route Frequency Ordered Stop   05/20/19 0800  ciprofloxacin (CIPRO) tablet 500 mg     500 mg Oral 2 times daily 05/19/19 1336 05/23/19 0759   05/20/19 0000  ciprofloxacin (CIPRO) 500 MG tablet     500 mg Oral 2 times daily 05/19/19 1117 05/23/19 2359   05/18/19 1200  cefTRIAXone (ROCEPHIN) 1 g  in sodium chloride 0.9 % 100 mL IVPB  Status:  Discontinued     1 g 200 mL/hr over 30 Minutes Intravenous Every 24 hours 05/18/19 1106 05/19/19 1336      Subjective: Patient seen and examined the bedside this morning.  Hemodynamically stable.  Respiratory status  stable ,currently on 4 L of oxygen per minute.  Again desaturated to 70% on ambulation, maintain saturation in 90s at 5 L/min.  Plan is to continue diuresis today and check tomorrow.  Patient verbalized understanding.  Objective: Vitals:   05/19/19 1330 05/19/19 2300 05/20/19 0510 05/20/19 0933  BP: 117/83 (!) 135/53 (!) 143/68 (!) 132/56  Pulse: 74 70 77   Resp: 20 16 18    Temp: 98.2 F (36.8 C) 97.9 F (36.6 C) 98.6 F (37 C)   TempSrc: Oral Oral Oral   SpO2: 93% 98% 97%   Weight:      Height:        Intake/Output Summary (Last 24 hours) at 05/20/2019 1257 Last data filed at 05/20/2019 4166 Gross per 24 hour  Intake 0 ml  Output 1050 ml  Net -1050 ml   Filed Weights   05/17/19 0500 05/18/19 0500 05/19/19 0542  Weight: 60.5 kg 59.3 kg 57.6 kg    Examination:  General exam: Not in distress, elderly debilitated male HEENT:PERRL,Oral mucosa moist, Ear/Nose normal on gross exam Respiratory system: Bilateral basal crackles, bilateral decreased air entry on the bases Cardiovascular system: S1 & S2 heard, RRR. No JVD, murmurs, rubs, gallops or clicks. Gastrointestinal system: Abdomen is nondistended, soft and nontender. No organomegaly or masses felt. Normal bowel sounds heard. Central nervous system: Alert and oriented. No focal neurological deficits. Extremities: No edema, no clubbing ,no cyanosis, distal peripheral pulses palpable. Skin: No rashes, lesions or ulcers,no icterus ,no pallor   Data Reviewed: I have personally reviewed following labs and imaging studies  CBC: Recent Labs  Lab 05/15/19 1626 05/16/19 0302 05/20/19 0454  WBC 10.7* 10.3 12.0*  NEUTROABS 7.4  --   --   HGB 8.0* 8.2* 8.4*  HCT 26.6* 27.7* 28.8*  MCV 89.3 89.1 89.2  PLT 382 372 063*   Basic Metabolic Panel: Recent Labs  Lab 05/15/19 1626 05/16/19 0302 05/17/19 0929 05/19/19 0515  NA 137 140 136 138  K 4.0 3.6 3.3* 3.7  CL 102 103 97* 99  CO2 26 28 29 31   GLUCOSE 138* 103* 146* 118*   BUN 21 19 18 18   CREATININE 1.63* 1.45* 1.36* 1.17  CALCIUM 8.2* 8.2* 8.3* 8.5*  MG  --  2.0  --   --   PHOS  --  4.6  --   --    GFR: Estimated Creatinine Clearance: 32.1 mL/min (by C-G formula based on SCr of 1.17 mg/dL). Liver Function Tests: Recent Labs  Lab 05/15/19 2046 05/16/19 0302  AST 16 15  ALT 19 19  ALKPHOS 64 66  BILITOT 0.2* 0.3  PROT 6.3* 6.4*  ALBUMIN 2.2* 2.3*   No results for input(s): LIPASE, AMYLASE in the last 168 hours. No results for input(s): AMMONIA in the last 168 hours. Coagulation Profile: No results for input(s): INR, PROTIME in the last 168 hours. Cardiac Enzymes: Recent Labs  Lab 05/15/19 1855 05/15/19 2046 05/16/19 0302 05/16/19 0910  TROPONINI 0.04* 0.03* 0.03* 0.04*   BNP (last 3 results) No results for input(s): PROBNP in the last 8760 hours. HbA1C: No results for input(s): HGBA1C in the last 72 hours. CBG: No results for input(s): GLUCAP in  the last 168 hours. Lipid Profile: No results for input(s): CHOL, HDL, LDLCALC, TRIG, CHOLHDL, LDLDIRECT in the last 72 hours. Thyroid Function Tests: No results for input(s): TSH, T4TOTAL, FREET4, T3FREE, THYROIDAB in the last 72 hours. Anemia Panel: No results for input(s): VITAMINB12, FOLATE, FERRITIN, TIBC, IRON, RETICCTPCT in the last 72 hours. Sepsis Labs: Recent Labs  Lab 05/15/19 1715 05/15/19 2046 05/16/19 0302  PROCALCITON  --  0.36  --   LATICACIDVEN 1.2  --  0.8    Recent Results (from the past 240 hour(s))  Culture, blood (routine x 2)     Status: None   Collection Time: 05/15/19  4:20 PM  Result Value Ref Range Status   Specimen Description   Final    BLOOD LEFT ANTECUBITAL Performed at [facility] [address], [place], [place] [address]    Special Requests   Final    BOTTLES DRAWN AEROBIC AND ANAEROBIC Blood Culture adequate volume Performed at [facility] [address] [place], [place] [address]    Culture   Final     NO GROWTH 5 DAYS Performed at [facility], [place] [address] [place], [place] [address]    Report Status 05/20/2019 FINAL  Final  SARS Coronavirus 2 (CEPHEID- Performed in [place] hospital lab), Hosp Order     Status: None   Collection Time: 05/15/19  4:25 PM  Result Value Ref Range Status   SARS Coronavirus 2 NEGATIVE NEGATIVE Final    Comment: (NOTE) If result is NEGATIVE SARS-CoV-2 target nucleic acids are NOT DETECTED. The SARS-CoV-2 RNA is generally detectable in upper and lower  respiratory specimens during the acute phase of infection. The lowest  concentration of SARS-CoV-2 viral copies this assay can detect is 250  copies / mL. A negative result does not preclude SARS-CoV-2 infection  and should not be used as the sole basis for treatment or other  patient management decisions.  A negative result may occur with  improper specimen collection / handling, submission of specimen other  than nasopharyngeal swab, presence of viral mutation(s) within the  areas targeted by this assay, and inadequate number of viral copies  (<250 copies / mL). A negative result must be combined with clinical  observations, patient history, and epidemiological information. If result is POSITIVE SARS-CoV-2 target nucleic acids are DETECTED. The SARS-CoV-2 RNA is generally detectable in upper and lower  respiratory specimens dur ing the acute phase of infection.  Positive  results are indicative of active infection with SARS-CoV-2.  Clinical  correlation with patient history and other diagnostic information is  necessary to determine patient infection status.  Positive results do  not rule out bacterial infection or co-infection with other viruses. If result is PRESUMPTIVE POSTIVE SARS-CoV-2 nucleic acids MAY BE PRESENT.   A presumptive positive result was obtained on the submitted specimen  and confirmed on repeat testing.  While 2019 novel coronavirus  (SARS-CoV-2) nucleic acids may be  present in the submitted sample  additional confirmatory testing may be necessary for epidemiological  and / or clinical management purposes  to differentiate between  SARS-CoV-2 and other Sarbecovirus currently known to infect humans.  If clinically indicated additional testing with an alternate test  methodology [phone]) is advised. The SARS-CoV-2 RNA is generally  detectable in upper and lower respiratory sp ecimens during the acute  phase of infection. The expected result is Negative. Fact Sheet for Patients:  StrictlyIdeas.no Fact Sheet for Healthcare Providers: BankingDealers.co.za This test  is not yet approved or cleared by the [place] and has been authorized for detection and/or diagnosis of SARS-CoV-2 by FDA under an Emergency Use Authorization (EUA).  This EUA will remain in effect (meaning this test can be used) for the duration of the COVID-19 declaration under Section 564(b)(1) of the Act, 21 U.S.C. section 360bbb-3(b)(1), unless the authorization is terminated or revoked sooner. Performed at [facility], [place] [address], [place], [place] [address]   Culture, blood (routine x 2)     Status: None   Collection Time: 05/15/19  4:47 PM  Result Value Ref Range Status   Specimen Description   Final    BLOOD RIGHT HAND Performed at [facility] [address], [place], [place] [address]    Special Requests   Final    BOTTLES DRAWN AEROBIC AND ANAEROBIC Blood Culture adequate volume Performed at [facility] [address] [place], [place] [address]    Culture   Final    NO GROWTH 5 DAYS Performed at [facility], [place] [address], [place], [place] [address]    Report Status 05/20/2019 FINAL  Final  Urine Culture     Status: Abnormal   Collection Time: 05/15/19  9:50 PM  Result Value Ref Range Status   Specimen Description   Final    URINE, CLEAN  CATCH Performed at [facility], [place] [address], [place], [place] [address]    Special Requests   Final    NONE Performed at [facility], [place] [address] [place],  [address]    Culture >=100,000 COLONIES/mL ENTEROBACTER HORMAECHEI (A)  Final   Report Status 05/19/2019 FINAL  Final   Organism ID, Bacteria ENTEROBACTER HORMAECHEI (A)  Final      Susceptibility   Enterobacter hormaechei - MIC*    CEFAZOLIN >=64 RESISTANT Resistant     CEFTRIAXONE <=1 SENSITIVE Sensitive     CIPROFLOXACIN <=0.25 SENSITIVE Sensitive     GENTAMICIN <=1 SENSITIVE Sensitive     IMIPENEM <=0.25 SENSITIVE Sensitive     NITROFURANTOIN 32 SENSITIVE Sensitive     TRIMETH/SULFA <=20 SENSITIVE Sensitive     PIP/TAZO <=4 SENSITIVE Sensitive     * >=100,000 COLONIES/mL ENTEROBACTER HORMAECHEI  MRSA PCR Screening     Status: None   Collection Time: 05/15/19 10:35 PM  Result Value Ref Range Status   MRSA by PCR NEGATIVE NEGATIVE Final    Comment:        The GeneXpert MRSA Assay (FDA approved for NASAL specimens only), is one component of a comprehensive MRSA colonization surveillance program. It is not intended to diagnose MRSA infection nor to guide or monitor treatment for MRSA infections. Performed at [facility], [place] [address] [place],  [address]          Radiology Studies: No results found.      Scheduled Meds: . amiodarone  200 mg Oral Daily  . apixaban  2.5 mg Oral BID  . atorvastatin  10 mg Oral Daily  . ciprofloxacin  500 mg Oral BID  . furosemide  40 mg Intravenous Q12H  . Gerhardt's butt cream   Topical BID  . mouth rinse  15 mL Mouth Rinse BID  . potassium chloride  40 mEq Oral Daily  . sodium chloride flush  3 mL Intravenous Q12H   Continuous Infusions: . sodium chloride Stopped (05/18/19 2054)     LOS: 5 days  Time spent:35 mins. More than 50% of that time was spent in counseling and/or  coordination of care.      Shelly Coss, MD [facility] Pager [phone]  If 7PM-7AM, please contact night-coverage [email or web address] Password [facility] 05/20/2019, 12:57 PM

## 2019-05-21 LAB — BASIC METABOLIC PANEL
Anion gap: 6 (ref 5–15)
BUN: 23 mg/dL (ref 8–23)
CO2: 32 mmol/L (ref 22–32)
Calcium: 8.3 mg/dL — ABNORMAL LOW (ref 8.9–10.3)
Chloride: 98 mmol/L (ref 98–111)
Creatinine, Ser: 1.49 mg/dL — ABNORMAL HIGH (ref 0.61–1.24)
GFR calc Af Amer: 46 mL/min — ABNORMAL LOW (ref >60)
GFR calc non Af Amer: 40 mL/min — ABNORMAL LOW (ref >60)
Glucose, Bld: 105 mg/dL — ABNORMAL HIGH (ref 70–99)
Potassium: 4.1 mmol/L (ref 3.5–5.1)
Sodium: 136 mmol/L (ref 135–145)

## 2019-05-21 MED ORDER — APIXABAN 2.5 MG PO TABS: 2.5000 mg | ORAL_TABLET | Freq: Two times a day (BID) | ORAL | 0 refills | Status: DC

## 2019-05-21 MED ORDER — ALBUTEROL SULFATE HFA 108 (90 BASE) MCG/ACT IN AERS: 1.0000 | INHALATION_SPRAY | RESPIRATORY_TRACT | 0 refills | Status: AC | PRN

## 2019-05-21 MED ORDER — FUROSEMIDE 20 MG PO TABS: 20.0000 mg | ORAL_TABLET | Freq: Every day | ORAL | Status: DC

## 2019-05-21 MED ORDER — FAMOTIDINE 20 MG PO TABS: 20.0000 mg | ORAL_TABLET | Freq: Every day | ORAL | 0 refills | Status: DC

## 2019-05-21 MED ORDER — POLYETHYLENE GLYCOL 3350 17 G PO PACK: 17.0000 g | PACK | Freq: Every day | ORAL | 0 refills | Status: DC | PRN

## 2019-05-21 MED ORDER — MELATONIN 3 MG PO TABS: 6.0000 mg | ORAL_TABLET | Freq: Every day | ORAL | 0 refills | Status: DC

## 2019-05-21 MED ORDER — IPRATROPIUM-ALBUTEROL 0.5-2.5 (3) MG/3ML IN SOLN: 3.0000 mL | RESPIRATORY_TRACT | 0 refills | Status: DC | PRN

## 2019-05-21 MED ORDER — MIRTAZAPINE 7.5 MG PO TABS: 7.5000 mg | ORAL_TABLET | Freq: Every day | ORAL | 0 refills | Status: DC

## 2019-05-21 MED ORDER — CIPROFLOXACIN HCL 500 MG PO TABS: 500.0000 mg | ORAL_TABLET | Freq: Every day | ORAL | Status: DC

## 2019-05-21 MED ORDER — DOCUSATE SODIUM 100 MG PO CAPS: 100.0000 mg | ORAL_CAPSULE | Freq: Two times a day (BID) | ORAL | 0 refills | Status: DC

## 2019-05-21 NOTE — TOC Transition Note (Signed)
Transition of Care [facility]) - CM/SW Discharge Note   Patient Details  Name: JASHUA KNAAK MRN: [id number] Date of Birth: 16-Apr-1923  Transition of Care [facility]) CM/SW Contact:  Dessa Phi, RN Phone Number: 05/21/2019, 12:32 PM   Clinical Narrative:  Ordered for nebulizer machine for home-[employer] aware rep Zach to deliver to rm prior d/c. Nsg aware.     Final next level of care: [place] Barriers to Discharge: No Barriers Identified   Patient Goals and CMS Choice Patient states their goals for this hospitalization and ongoing recovery are:: get better CMS Medicare.gov Compare Post Acute Care list provided to:: (na) Choice offered to / list presented to : NA  Discharge Placement                       Discharge Plan and Services In-house Referral: Clinical Social Work Discharge Planning Services: CM Consult            DME Arranged: [place] DME Agency: [employer] Date DME Agency Contacted: 05/21/19 Time DME Agency Contacted: [id number] Representative spoke with at DME Agency: [place]: RN, PT [place] Agency: [place] ([place]) Date [place]: 05/21/19 Time [place]: 1009 Representative spoke with at [place]: [place] ([place]) Interventions     Readmission Risk Interventions Readmission Risk Prevention Plan 05/20/2019 03/08/2019  Transportation Screening Complete Complete  PCP or Specialist Appt within 5-7 Days - Complete  PCP or Specialist Appt within 3-5 Days Not Complete -  Not Complete comments not yet ready for d/c -  Home Care Screening - Complete  Medication Review (RN CM) - Complete  HRI or Home Care Consult Complete -  Social Work Consult for [place] Planning/Counseling Complete -  Palliative Care Screening Not Applicable -  Medication Review [employer]) Complete -  Some recent data might be hidden

## 2019-05-21 NOTE — Progress Notes (Addendum)
PROGRESS NOTE    Andre [name]  OIZ:[id number] DOB: 1923/10/03 DOA: 05/15/2019 PCP: Rosaria Ferries, MD   Brief Narrative: Patient is a 85 year old male with history of complete heart block status post pacemaker placement, hyperlipidemia, hypertension, chronic diastolic CHF, CKD stage III, atrial fibrillation who presents to the emergency department from SNF with complaints of worsening shortness of breath.  Patient is on oxygen at 4 L/min at home.  He was admitted in April with congestive heart failure requiring intubation.  On arrival he was hypoxic satting in the range of 50s.  Started on BiPAP.  Cardiology consulted and following.  Started on IV diuresis. His respiratory status has continued to improve.  Currently he will be continued on IV diuresis.  He has been weaned off BiPAP . He is saturating fine on 3 L of oxygen per minute.  Stable for discharge today.  Assessment & Plan:   Active Problems:   Hyperlipidemia   Essential hypertension   Acute-on-chronic kidney disease stage III   Atrial fibrillation/flutter   CHB (complete heart block) (HCC)   Pressure injury of skin   Pacemaker   Hypoxia   Acute on chronic respiratory failure with hypoxia (HCC)   Chronic atrial fibrillation   Acute exacerbation of CHF (congestive heart failure) (HCC)   Hypoalbuminemia   Acute on chronic heart failure (HCC)   Acute on chronic diastolic CHF (congestive heart failure) (HCC)   Acute on chronic diastolic CHF (congestive heart failure) (HCC)   Hypokalemia   Acute on chronic respiratory failure with hypoxia: Secondary to CHF exacerbation.  Initial imaging did not show any pneumonia.  Antibiotics discontinued.  Started on BiPAP.Now off Bipap and on nasal cannula at 4L/min.  Acute on chronic diastolic CHF: Echocardiogram on 3/20 shows ejection fraction of 60 to 65%.  Continue diuresis with IV Lasix.  Cardiology was  following.  Has crackles bilaterally on the bases.  No peripheral  edema. Mildly elevated troponin secondary to CHF.  He will follow-up with cardiology as an outpatient.  Acute on chronic kidney disease stage III: Baseline creatinine ranges from 1.5-2.Currently kidney function is on baseline.  UTI: Urine culture grew Enterobacter .  Continue ciprofloxacin.  Atrial flutter/fibrillation:CHA2DS2 vas score 4.  Currently in sinus with paced rhythm.  Continue amiodarone.  On Eliquis for anticoagulation.  Complete heart block: Status post pacemaker.  Follows with cardiology  Hypertension: Currently blood pressure stable.  Amlodipine held  Hyperlipidemia: Continue home medications  Deconditioning/debility: Physical therapy/Occupational Therapy consulted.Recommended SNF. Social worker consulted.  Patient and family want home with home health         DVT prophylaxis:Eliquis Code Status: Full Family Communication: Discussed with daughter Disposition Plan: Home today  Consultants: Cardiology  Procedures: None  Antimicrobials:  Anti-infectives (From admission, onward)   Start     Dose/Rate Route Frequency Ordered Stop   05/20/19 0800  ciprofloxacin (CIPRO) tablet 500 mg     500 mg Oral 2 times daily 05/19/19 1336 05/23/19 0759   05/20/19 0000  ciprofloxacin (CIPRO) 500 MG tablet     500 mg Oral 2 times daily 05/19/19 1117 05/23/19 2359   05/18/19 1200  cefTRIAXone (ROCEPHIN) 1 g in sodium chloride 0.9 % 100 mL IVPB  Status:  Discontinued     1 g 200 mL/hr over 30 Minutes Intravenous Every 24 hours 05/18/19 1106 05/19/19 1336      Subjective: Patient seen and examined the bedside this morning.  Feels better.  Currently saturating fine on 3 L of  oxygen per minute.  Stable for discharge Objective: Vitals:   05/20/19 1445 05/20/19 2118 05/21/19 0620 05/21/19 0935  BP: 129/65 (!) 129/56 (!) 130/53 124/68  Pulse: 75 77 74 72  Resp: 16 20 20    Temp: 98.1 F (36.7 C) 98.6 F (37 C) 97.7 F (36.5 C)   TempSrc: Oral Oral Oral   SpO2: 97% 99% 94%  95%  Weight:   57.5 kg   Height:        Intake/Output Summary (Last 24 hours) at 05/21/2019 1205 Last data filed at 05/21/2019 1000 Gross per 24 hour  Intake 603 ml  Output 750 ml  Net -147 ml   Filed Weights   05/18/19 0500 05/19/19 0542 05/21/19 0620  Weight: 59.3 kg 57.6 kg 57.5 kg    Examination:   General exam:Not in distress, elderly male HEENT:PERRL,Oral mucosa moist, Ear/Nose normal on gross exam Respiratory system: Bilateral basal crackles, bilateral decreased air entry on the bases Cardiovascular system: S1 & S2 heard, RRR. No JVD, murmurs, rubs, gallops or clicks. Gastrointestinal system: Abdomen is nondistended, soft and nontender. No organomegaly or masses felt. Normal bowel sounds heard. Central nervous system: Alert and oriented. No focal neurological deficits. Extremities: No edema, no clubbing ,no cyanosis, distal peripheral pulses palpable. Skin: No rashes, lesions or ulcers,no icterus ,no pallor    Data Reviewed: I have personally reviewed following labs and imaging studies  CBC: Recent Labs  Lab 05/15/19 1626 05/16/19 0302 05/20/19 0454  WBC 10.7* 10.3 12.0*  NEUTROABS 7.4  --   --   HGB 8.0* 8.2* 8.4*  HCT 26.6* 27.7* 28.8*  MCV 89.3 89.1 89.2  PLT 382 372 333*   Basic Metabolic Panel: Recent Labs  Lab 05/15/19 1626 05/16/19 0302 05/17/19 0929 05/19/19 0515 05/21/19 0446  NA 137 140 136 138 136  K 4.0 3.6 3.3* 3.7 4.1  CL 102 103 97* 99 98  CO2 26 28 29 31  32  GLUCOSE 138* 103* 146* 118* 105*  BUN 21 19 18 18 23   CREATININE 1.63* 1.45* 1.36* 1.17 1.49*  CALCIUM 8.2* 8.2* 8.3* 8.5* 8.3*  MG  --  2.0  --   --   --   PHOS  --  4.6  --   --   --    GFR: Estimated Creatinine Clearance: 25.2 mL/min (A) (by C-G formula based on SCr of 1.49 mg/dL (H)). Liver Function Tests: Recent Labs  Lab 05/15/19 2046 05/16/19 0302  AST 16 15  ALT 19 19  ALKPHOS 64 66  BILITOT 0.2* 0.3  PROT 6.3* 6.4*  ALBUMIN 2.2* 2.3*   No results for  input(s): LIPASE, AMYLASE in the last 168 hours. No results for input(s): AMMONIA in the last 168 hours. Coagulation Profile: No results for input(s): INR, PROTIME in the last 168 hours. Cardiac Enzymes: Recent Labs  Lab 05/15/19 1855 05/15/19 2046 05/16/19 0302 05/16/19 0910  TROPONINI 0.04* 0.03* 0.03* 0.04*   BNP (last 3 results) No results for input(s): PROBNP in the last 8760 hours. HbA1C: No results for input(s): HGBA1C in the last 72 hours. CBG: No results for input(s): GLUCAP in the last 168 hours. Lipid Profile: No results for input(s): CHOL, HDL, LDLCALC, TRIG, CHOLHDL, LDLDIRECT in the last 72 hours. Thyroid Function Tests: No results for input(s): TSH, T4TOTAL, FREET4, T3FREE, THYROIDAB in the last 72 hours. Anemia Panel: No results for input(s): VITAMINB12, FOLATE, FERRITIN, TIBC, IRON, RETICCTPCT in the last 72 hours. Sepsis Labs: Recent Labs  Lab 05/15/19 1715 05/15/19  2046 05/16/19 0302  PROCALCITON  --  0.36  --   LATICACIDVEN 1.2  --  0.8    Recent Results (from the past 240 hour(s))  Culture, blood (routine x 2)     Status: None   Collection Time: 05/15/19  4:20 PM  Result Value Ref Range Status   Specimen Description   Final    BLOOD LEFT ANTECUBITAL Performed at [facility] [address], [place], [place] [address]    Special Requests   Final    BOTTLES DRAWN AEROBIC AND ANAEROBIC Blood Culture adequate volume Performed at [facility] [address] [place], [place] [address]    Culture   Final    NO GROWTH 5 DAYS Performed at [facility], [place] [address], [place], [place] [address]    Report Status 05/20/2019 FINAL  Final  SARS Coronavirus 2 (CEPHEID- Performed in [facility] hospital lab), Hosp Order     Status: None   Collection Time: 05/15/19  4:25 PM  Result Value Ref Range Status   SARS Coronavirus 2 NEGATIVE NEGATIVE Final    Comment: (NOTE) If result is NEGATIVE SARS-CoV-2 target  nucleic acids are NOT DETECTED. The SARS-CoV-2 RNA is generally detectable in upper and lower  respiratory specimens during the acute phase of infection. The lowest  concentration of SARS-CoV-2 viral copies this assay can detect is 250  copies / mL. A negative result does not preclude SARS-CoV-2 infection  and should not be used as the sole basis for treatment or other  patient management decisions.  A negative result may occur with  improper specimen collection / handling, submission of specimen other  than nasopharyngeal swab, presence of viral mutation(s) within the  areas targeted by this assay, and inadequate number of viral copies  (<250 copies / mL). A negative result must be combined with clinical  observations, patient history, and epidemiological information. If result is POSITIVE SARS-CoV-2 target nucleic acids are DETECTED. The SARS-CoV-2 RNA is generally detectable in upper and lower  respiratory specimens dur ing the acute phase of infection.  Positive  results are indicative of active infection with SARS-CoV-2.  Clinical  correlation with patient history and other diagnostic information is  necessary to determine patient infection status.  Positive results do  not rule out bacterial infection or co-infection with other viruses. If result is PRESUMPTIVE POSTIVE SARS-CoV-2 nucleic acids MAY BE PRESENT.   A presumptive positive result was obtained on the submitted specimen  and confirmed on repeat testing.  While 2019 novel coronavirus  (SARS-CoV-2) nucleic acids may be present in the submitted sample  additional confirmatory testing may be necessary for epidemiological  and / or clinical management purposes  to differentiate between  SARS-CoV-2 and other Sarbecovirus currently known to infect humans.  If clinically indicated additional testing with an alternate test  methodology [phone]) is advised. The SARS-CoV-2 RNA is generally  detectable in upper and lower  respiratory sp ecimens during the acute  phase of infection. The expected result is Negative. Fact Sheet for Patients:  StrictlyIdeas.no Fact Sheet for Healthcare Providers: BankingDealers.co.za This test is not yet approved or cleared by the [place] FDA and has been authorized for detection and/or diagnosis of SARS-CoV-2 by FDA under an Emergency Use Authorization (EUA).  This EUA will remain in effect (meaning this test can be used) for the duration of the COVID-19 declaration under Section 564(b)(1) of the Act, 21 U.S.C. section 360bbb-3(b)(1), unless the authorization is terminated  or revoked sooner. Performed at [facility], [place] [address], [place], [place] [address]   Culture, blood (routine x 2)     Status: None   Collection Time: 05/15/19  4:47 PM  Result Value Ref Range Status   Specimen Description   Final    BLOOD RIGHT HAND Performed at [facility] [address], [place], [place] [address]    Special Requests   Final    BOTTLES DRAWN AEROBIC AND ANAEROBIC Blood Culture adequate volume Performed at [facility] [address] [place], [place] [address]    Culture   Final    NO GROWTH 5 DAYS Performed at [facility], [place] [address], [place], [place] [address]    Report Status 05/20/2019 FINAL  Final  Urine Culture     Status: Abnormal   Collection Time: 05/15/19  9:50 PM  Result Value Ref Range Status   Specimen Description   Final    URINE, CLEAN CATCH Performed at [facility], [place] [address], [place], [place] [address]    Special Requests   Final    NONE Performed at [facility], [place] [address] [place], [place] [address]    Culture >=100,000 COLONIES/mL ENTEROBACTER HORMAECHEI (A)  Final   Report Status 05/19/2019 FINAL  Final   Organism ID, Bacteria ENTEROBACTER HORMAECHEI (A)  Final       Susceptibility   Enterobacter hormaechei - MIC*    CEFAZOLIN >=64 RESISTANT Resistant     CEFTRIAXONE <=1 SENSITIVE Sensitive     CIPROFLOXACIN <=0.25 SENSITIVE Sensitive     GENTAMICIN <=1 SENSITIVE Sensitive     IMIPENEM <=0.25 SENSITIVE Sensitive     NITROFURANTOIN 32 SENSITIVE Sensitive     TRIMETH/SULFA <=20 SENSITIVE Sensitive     PIP/TAZO <=4 SENSITIVE Sensitive     * >=100,000 COLONIES/mL ENTEROBACTER HORMAECHEI  MRSA PCR Screening     Status: None   Collection Time: 05/15/19 10:35 PM  Result Value Ref Range Status   MRSA by PCR NEGATIVE NEGATIVE Final    Comment:        The GeneXpert MRSA Assay (FDA approved for NASAL specimens only), is one component of a comprehensive MRSA colonization surveillance program. It is not intended to diagnose MRSA infection nor to guide or monitor treatment for MRSA infections. Performed at [facility], [place] [address], [place], [place] [address]          Radiology Studies: No results found.      Scheduled Meds:  amiodarone  200 mg Oral Daily   apixaban  2.5 mg Oral BID   atorvastatin  10 mg Oral Daily   ciprofloxacin  500 mg Oral BID   [START ON 05/22/2019] furosemide  20 mg Oral Daily   Gerhardt's butt cream   Topical BID   mouth rinse  15 mL Mouth Rinse BID   potassium chloride  40 mEq Oral Daily   sodium chloride flush  3 mL Intravenous Q12H   Continuous Infusions:  sodium chloride Stopped (05/18/19 2054)     LOS: 6 days    Time spent:35 mins. More than 50% of that time was spent in counseling and/or coordination of care.      Shelly Coss, MD [facility] Pager [phone]  If 7PM-7AM, please contact night-coverage [email or web address] Password [email or web address] 05/21/2019, 12:05 PM

## 2019-05-21 NOTE — TOC Transition Note (Signed)
Transition of Care [facility]) - CM/SW Discharge Note   Patient Details  Name: Andre [name] MRN: [id number] Date of Birth: 1923-08-09  Transition of Care [facility]) CM/SW Contact:  Dessa Phi, RN Phone Number: 05/21/2019, 10:10 AM   Clinical Narrative: Spoke t patient in rm d/c plan-home w/[facility] already chosen-rep Santiago Glad aware of d/c. [employer] for home 02-rep Zach aware of d/c today & to deliver home 02 travel tank to rm prior d/c. Patient has rw. From home w/spouse. Dtr-Joanne will transport home. No further CM needs.      Final next level of care: [place] Barriers to Discharge: No Barriers Identified   Patient Goals and CMS Choice Patient states their goals for this hospitalization and ongoing recovery are:: get better CMS Medicare.gov Compare Post Acute Care list provided to:: (na) Choice offered to / list presented to : NA  Discharge Placement                       Discharge Plan and Services In-house Referral: Clinical Social Work Discharge Planning Services: CM Consult            DME Arranged: Oxygen DME Agency: [employer] Date DME Agency Contacted: 05/21/19 Time DME Agency Contacted: 0165 Representative spoke with at DME Agency: [place]: RN, PT [place] Agency: [place] ([place]) Date [place]: 05/21/19 Time [place]: 1009 Representative spoke with at [place]: [place] ([place]) Interventions     Readmission Risk Interventions Readmission Risk Prevention Plan 05/20/2019 03/08/2019  Transportation Screening Complete Complete  PCP or Specialist Appt within 5-7 Days - Complete  PCP or Specialist Appt within 3-5 Days Not Complete -  Not Complete comments not yet ready for d/c -  Home Care Screening - Complete  Medication Review (RN CM) - Complete  HRI or Home Care Consult Complete -  Social Work Consult for [place] Planning/Counseling Complete -  Palliative Care  Screening Not Applicable -  Medication Review Press photographer) Complete -  Some recent data might be hidden

## 2019-05-21 NOTE — Progress Notes (Signed)
All of patients discharge information discussed with patient, including all medications, prescriptions, discharge instructions, and other information on AVS. Patient had all of his questions answered. All personal belongings including cell phone gathered and sent with patient, as well as oxygen tanks for home use and nebulizer machine. Offered to call patients daughter, Mechele Claude, but patient declined.

## 2019-05-23 ENCOUNTER — Telehealth: Payer: Self-pay | Admitting: Cardiology

## 2019-05-23 MED ORDER — AMIODARONE HCL 200 MG PO TABS: 200.0000 mg | ORAL_TABLET | Freq: Every day | ORAL | 2 refills | Status: DC

## 2019-05-23 NOTE — Telephone Encounter (Signed)
Submitted refill into pharmacy. As discharge instructions say to continue medication.

## 2019-05-23 NOTE — Telephone Encounter (Signed)
New message   Pt c/o medication issue:  1. Name of Medication: amiodarone (PACERONE) 200 MG tablet  2. How are you currently taking this medication (dosage and times per day)? n/a  3. Are you having a reaction (difficulty breathing--STAT)?n/a  4. What is your medication issue?Patient's daughter states that this medication was not ordered when the patient was discharged from the hospital. Please advise.

## 2019-05-24 ENCOUNTER — Other Ambulatory Visit: Payer: Self-pay

## 2019-05-24 MED ORDER — AMIODARONE HCL 200 MG PO TABS: 200.0000 mg | ORAL_TABLET | Freq: Every day | ORAL | 2 refills | Status: DC

## 2019-05-24 NOTE — Telephone Encounter (Signed)
Called patient daughter, advised medication was sent in- as discharge instructions stated to continue medication.  No other questions at this time.

## 2019-05-24 NOTE — Telephone Encounter (Signed)
Daughter of patient was calling to follow-up on the medicine question from yesterday. Please advise

## 2019-05-27 ENCOUNTER — Telehealth: Payer: Self-pay | Admitting: Family Medicine

## 2019-05-27 ENCOUNTER — Encounter: Payer: Self-pay | Admitting: Family Medicine

## 2019-05-27 ENCOUNTER — Ambulatory Visit (INDEPENDENT_AMBULATORY_CARE_PROVIDER_SITE_OTHER): Payer: Medicare Other | Admitting: Family Medicine

## 2019-05-27 VITALS — BP 127/55 | HR 78 | Ht 65.0 in | Wt 128.0 lb

## 2019-05-27 DIAGNOSIS — D649 Anemia, unspecified: Secondary | ICD-10-CM | POA: Diagnosis not present

## 2019-05-27 DIAGNOSIS — I5032 Chronic diastolic (congestive) heart failure: Secondary | ICD-10-CM

## 2019-05-27 DIAGNOSIS — I10 Essential (primary) hypertension: Secondary | ICD-10-CM | POA: Diagnosis not present

## 2019-05-27 MED ORDER — AMLODIPINE BESYLATE 5 MG PO TABS: 5.0000 mg | ORAL_TABLET | Freq: Every day | ORAL | 3 refills | Status: DC

## 2019-05-27 MED ORDER — FUROSEMIDE 20 MG PO TABS: 20.0000 mg | ORAL_TABLET | Freq: Every day | ORAL | 3 refills | Status: DC

## 2019-05-27 MED ORDER — APIXABAN 2.5 MG PO TABS: 2.5000 mg | ORAL_TABLET | Freq: Two times a day (BID) | ORAL | 3 refills | Status: AC

## 2019-05-27 MED ORDER — ATORVASTATIN CALCIUM 10 MG PO TABS: 10.0000 mg | ORAL_TABLET | Freq: Every day | ORAL | 3 refills | Status: AC

## 2019-05-27 MED ORDER — LOSARTAN POTASSIUM-HCTZ 100-25 MG PO TABS: 1.0000 | ORAL_TABLET | Freq: Every day | ORAL | 3 refills | Status: DC

## 2019-05-27 MED ORDER — POTASSIUM CHLORIDE ER 20 MEQ PO TBCR: 20.0000 meq | EXTENDED_RELEASE_TABLET | Freq: Every day | ORAL | 3 refills | Status: DC | PRN

## 2019-05-27 NOTE — Patient Instructions (Addendum)
There are no preventive care reminders to display for this patient.  Depression screen [facility] 2/9 09/03/2018 08/28/2017 04/05/2016  Decreased Interest 0 0 0  Down, Depressed, Hopeless 0 0 0  PHQ - 2 Score 0 0 0

## 2019-05-27 NOTE — Progress Notes (Signed)
Phone [phone]   Subjective:  Virtual visit via Video note. Chief complaint: Chief Complaint  Patient presents with  . Hospitalization Follow-up    In the ED 05/15/2019 - 05/21/2019. Referred to [facility] for nursing and PT[facility], [facility] for home oxygen.   . Congestive Heart Failure  . Anemia    This visit type was conducted due to national recommendations for restrictions regarding the COVID-19 Pandemic (e.g. social distancing).  This format is felt to be most appropriate for this patient at this time balancing risks to patient and risks to population by having him in for in person visit.  No physical exam was performed (except for noted visual exam or audio findings with Telehealth visits).    Our team/I connected with Larene Beach at  4:00 PM EDT by a video enabled telemedicine application (doxy.me or caregility through epic) and verified that I am speaking with the correct person using two identifiers.  Location patient: Home-O[facility] Location provider: [facility], office Persons participating in the virtual visit:  patient  Our team/I discussed the limitations of evaluation and management by telemedicine and the availability of in person appointments. In light of current covid-19 pandemic, patient also understands that we are trying to protect them by minimizing in office contact if at all possible.  The patient expressed consent for telemedicine visit and agreed to proceed. Patient understands insurance will be billed.   ROS- continued shortness of breath, no chest pain reported. Minimal edema. No recent weight gain. Does not report cough.    Past Medical History-  Patient Active Problem List   Diagnosis Date Noted  . Peripheral vascular disease ([place]) 04/21/2016    Priority: High  . Atrial fibrillation/flutter 03/17/2016    Priority: High  . Acute on chronic diastolic heart failure ([place]) 01/05/2016    Priority: High  . Acute-on-chronic kidney disease stage III 02/17/2015   Priority: Medium  . Malignant neoplasm of prostate ([place]) 01/05/2010    Priority: Medium  . Hyperlipidemia 12/04/2007    Priority: Medium  . Essential hypertension 12/04/2007    Priority: Medium  . Macular degeneration 02/17/2015    Priority: Low  . Former smoker 02/17/2015    Priority: Low  . History of skin cancer     Priority: Low  . Angiodysplasia of intestine with hemorrhage 05/15/2007    Priority: Low  . Hypokalemia   . Acute exacerbation of CHF (congestive heart failure) ([place]) 05/15/2019  . Hypoalbuminemia 05/15/2019  . Acute on chronic heart failure ([place]) 05/15/2019  . Acute on chronic diastolic CHF (congestive heart failure) ([place]) 05/15/2019  . Acute on chronic diastolic CHF (congestive heart failure) ([place]) 05/15/2019  . Acute on chronic respiratory failure with hypoxia ([place])   . Severe sepsis ([place])   . Chronic diastolic heart failure ([place])   . Lobar pneumonia, unspecified organism ([place])   . Chronic atrial fibrillation   . Pacemaker 03/12/2019  . Hypoxia 03/12/2019  . Pressure injury of skin 02/27/2019  . CHB (complete heart block) ([place]) 02/23/2019    Medications- reviewed and updated Current Outpatient Medications  Medication Sig Dispense Refill  . albuterol (VENTOLIN HFA) 108 (90 Base) MCG/ACT inhaler Inhale 1 puff into the lungs every 4 (four) hours as needed for wheezing or shortness of breath. 1 Inhaler 0  . amiodarone (PACERONE) 200 MG tablet Take 1 tablet (200 mg total) by mouth daily. 30 tablet 2  . amLODipine (NORVASC) 5 MG tablet Take 5 mg by mouth daily.    Marland Kitchen apixaban (  ELIQUIS) 2.5 MG TABS tablet Take 1 tablet (2.5 mg total) by mouth 2 (two) times daily. 60 tablet 0  . atorvastatin (LIPITOR) 10 MG tablet Take 1 tablet (10 mg total) by mouth daily. 90 tablet 3  . docusate sodium (COLACE) 100 MG capsule Take 1 capsule (100 mg total) by mouth 2 (two) times daily. 60 capsule 0  . famotidine (PEPCID) 20 MG tablet Take 1 tablet (20 mg total) by mouth daily. 30  tablet 0  . furosemide (LASIX) 20 MG tablet Take 1 tablet (20 mg total) by mouth daily for 30 days. 30 tablet 0  . ipratropium-albuterol (DUONEB) 0.5-2.5 (3) MG/3ML SOLN Take 3 mLs by nebulization every 4 (four) hours as needed (shortness of breath). 360 mL 0  . Melatonin 3 MG TABS Take 2 tablets (6 mg total) by mouth at bedtime. 60 tablet 0  . mirtazapine (REMERON) 7.5 MG tablet Take 1 tablet (7.5 mg total) by mouth at bedtime. 30 tablet 0  . polyethylene glycol (MIRALAX / GLYCOLAX) 17 g packet Take 17 g by mouth daily as needed. 30 each 0  . potassium chloride 20 MEQ TBCR Take 20 mEq by mouth daily. 30 tablet 0   No current facility-administered medications for this visit.      Objective:  BP (!) 127/55   Pulse 78   Ht 5\' 5"  (1.651 m)   Wt 128 lb (58.1 kg)   SpO2 98% Comment: on 3L at rest  BMI 21.30 kg/m  self reported vitals Gen: NAD, resting comfortably  Lungs: nonlabored, normal respiratory rate on 3 L home 02 Skin: appears dry, no obvious rash Family pushes on patient's legs and takes under 7 seconds for indentation (pretibial edema to resolve)     Assessment and Plan   #Hospital follow-up for CHF exacerbation S: Unfortunately patient has had a very complicated course since the last time I saw him in December 2019.  Back home for first time since march hospitalization.  Will be getting thearpy at home.  Patient is thrilled to be home   In March 2020 he was admitted and ultimately had to have a permanent pacemaker implanted on February 24, 2019 but also at this time had an episode of diastolic CHF exacerbation and was discharged on Lasix 40 mg daily.  During this hospitalization he also had a TEE/DCCV which was successful cardioversion on March 01, 2019 but he ultimately reverted back and eventually had to be started on amiodarone.  He was anticoagulated with Xarelto.  Patient developed hypoxia requiring oxygen during this hospital stay.  During March hospitalization he was  continued on amlodipine and Imdur.  Losartan hydrochlorothiazide was stopped given his renal function.  Patient was discharged to [facility] facility but ultimately returned to the hospital on April 8-he was sent to a [facility] by [facility] burn.  He presented with acute hypoxic respiratory failure with bilateral pneumonia-treated as healthcare acquired pneumonia but ultimately developed septic shock per notes through care everywhere.  He ultimately had to be intubated for 6 days.  Patient was noted to have anemia down to the 9-10 level-he was continued on Xarelto but plan was to hold this if any signs of GI bleed-he did not develop any.  He required IV diuresis with Lasix 40 mg twice a day and later transitioned to oral 40 mg twice daily. Patient had an updated echocardiogram in March which still showed EF 60 to 65%. [facility] was [facility]  Until April 22nd.  after [facility] for 2 weeks then went to [facility].   Patient's most recent hospitalization was from March 3 to March 9- At [facility] got out of breath and he requested to be transported to the hospital.  Upon arrival he was hypoxic satting in the 50s and was started on BiPAP and IV diuresis.  Ultimately was able to be weaned off BiPAP and down to oral Lasix.  It appears there were discussions about sending him back to [facility] but he preferred to go home.  Patient states he still is not breathing the best.  He is still requiring 3 L of oxygen at rest and 5 L with activity- sometimes they just stick with 4 L overall.  Patient had another exacerbation of diastolic heart failure leading to acute on chronic respiratory failure with associated hypoxia.  Chest x-ray did not show pneumonia and antibiotics were discontinued.  Required BiPAP initially as above but ultimately weaned to 4 L nasal cannula.    Patient did require IV diuresis but was ultimately discharged with Lasix 20 mg daily.  His atrial  flutter/fibrillation was treated with amiodarone for antiarrhythmic and Eliquis for anticoagulation-patient states he was changed to this from [place].  Pacemaker appears to be stable as did complete heart block.  Patient has follow-up with Dr. Caryl Comes about his pacemaker on Thursday.  The following Thursday the 25th he will see [name].  Patient had worsening creatinine which improved by discharge back to 1.5-2 range.  He did have a UTI while hospitalized and finished 3 days of antibiotics for Enterobacter hormaechei.   Patient is compliant with Lasix 20 mg daily.  He is watching his weight closely.  They are monitoring for edema or any increased shortness of breath.  For his blood pressure patient tells me he is still taking losartan hydrochlorothiazide 100-25 mg.  He is also on amlodipine 10 mg though discharge paperwork said 5 mg and Lasix 20 mg daily as above.  He is off isosorbide mononitrate/Imdur  [facility] and PT.  They believe we should be able to get labs through [facility]. A/P: Patient appears to be stable after multiple recent  hospitalizations-all of which appear to have showed CHF as a contributing factor. -Continue Lasix 20 mg daily along with potassium. -Close follow-up with [name] next week -Recommended follow-up with me 10 days after seeing [name] - Patient still with shortness of breath-I wonder if anemia could be contributing-we will look at ferritin, B12, folate to see if contributing - Chronic kidney disease and anemia of chronic disease could be contributing -Sent request for bloodraw to [facility] nurse -cbc, cmp, ferritin, b12, folate - patient has 24/7 care Freda Munro. Nurse twice a week. Mechele Claude every other day. GREAT support system -continue daily weigh ins/weight monitoring  Essential hypertension Patient's blood pressure is controlled on amlodipine 10 mg( was supposed to be reduced to 5 mg at discharge),  losartan-hydrochlorothiazide 100-25 mg (was supposed to be stopped at discharge) and Lasix 20 mg daily.  With recurrent heart failure hospitalizations I would like to reduce his amlodipine as my first priority-I reduced to 5 mg today.  He is going to continue the losartan hydrochlorothiazide for now but with his renal function in fact he is on Lasix it may make sense to change to losartan 100 mg and stop HCTZ- sees [name] next week (I am happy to order any med changes). Perhaps can reduce amlodipine to 2.5  mg at that visit and if still controlled- stop at following visit. If he would prefer to stop HCTZ sooner im also agreeable.    Future Appointments  Date Time Provider [place]  05/30/2019  4:30 PM Deboraha Sprang, MD [facility]  06/06/2019  9:40 AM Stanford Breed Denice Bors, MD [facility]  09/04/2019 10:40 AM Yong Channel Brayton Mars, MD [facility]   Lab/Order associations:   ICD-10-CM   1. Chronic heart failure with preserved ejection fraction (HCC)  I50.32   2. Anemia, unspecified type  D64.9   3. Essential hypertension  I10         Meds ordered this encounter  Medications  . amLODipine (NORVASC) 5 MG tablet    Sig: Take 1 tablet (5 mg total) by mouth daily.    Dispense:  90 tablet    Refill:  3  . apixaban (ELIQUIS) 2.5 MG TABS tablet    Sig: Take 1 tablet (2.5 mg total) by mouth 2 (two) times daily.    Dispense:  180 tablet    Refill:  3  . atorvastatin (LIPITOR) 10 MG tablet    Sig: Take 1 tablet (10 mg total) by mouth daily.    Dispense:  90 tablet    Refill:  3  . furosemide (LASIX) 20 MG tablet    Sig: Take 1 tablet (20 mg total) by mouth daily.    Dispense:  90 tablet    Refill:  3  . Potassium Chloride ER 20 MEQ TBCR    Sig: Take 20 mEq by mouth daily as needed (as long as taking lasix).    Dispense:  90 tablet    Refill:  3  . losartan-hydrochlorothiazide (HYZAAR) 100-25 MG tablet    Sig: Take 1 tablet by mouth daily.    Dispense:  90  tablet    Refill:  3   Time Stamp The duration of face-to-face time during this visit was greater than 40 minutes. Greater than 50% of this time was spent in counseling, explanation of diagnosis, planning of further management, and/or coordination of care including discussing how tough recent events have been for patient and how happy he is to be home, counseling on meds and reviewing them together as well as my concerns, planning follow up. .  Return precautions advised.  Garret Reddish, MD

## 2019-05-27 NOTE — Telephone Encounter (Signed)
Copied from [place] [id number]. Topic: Quick Communication - Home Health Verbal Orders >> May 27, 2019  1:21 PM Nils Flack wrote: Caller/Agency: [facility]  Callback Number: [phone] Requesting OT/PT/Skilled Nursing/Social Work/Speech Therapy: skilled nursing Frequency: 1 week1 2 week2 1 week 3 1 overy other weekffor 2 weeks 2 prn

## 2019-05-27 NOTE — Assessment & Plan Note (Signed)
Patient's blood pressure is controlled on amlodipine 10 mg( was supposed to be reduced to 5 mg at discharge), losartan-hydrochlorothiazide 100-25 mg (was supposed to be stopped at discharge) and Lasix 20 mg daily.  With recurrent heart failure hospitalizations I would like to reduce his amlodipine as my first priority-I reduced to 5 mg today.  He is going to continue the losartan hydrochlorothiazide for now but with his renal function in fact he is on Lasix it may make sense to change to losartan 100 mg and stop HCTZ- sees [name] next week (I am happy to order any med changes). Perhaps can reduce amlodipine to 2.5 mg at that visit and if still controlled- stop at following visit. If he would prefer to stop HCTZ sooner im also agreeable.

## 2019-05-28 ENCOUNTER — Ambulatory Visit (INDEPENDENT_AMBULATORY_CARE_PROVIDER_SITE_OTHER): Payer: Medicare Other | Admitting: *Deleted

## 2019-05-28 ENCOUNTER — Telehealth: Payer: Self-pay | Admitting: Family Medicine

## 2019-05-28 DIAGNOSIS — R001 Bradycardia, unspecified: Secondary | ICD-10-CM

## 2019-05-28 LAB — CUP PACEART REMOTE DEVICE CHECK
Battery Remaining Longevity: 61 mo
Battery Remaining Percentage: 95.5 %
Battery Voltage: 3.01 V
Brady Statistic AP VP Percent: 1 %
Brady Statistic AP VS Percent: 1 %
Brady Statistic AS VP Percent: 99 %
Brady Statistic AS VS Percent: 1 %
Brady Statistic RA Percent Paced: 1 %
Brady Statistic RV Percent Paced: 96 %
Date Time Interrogation Session: 20200615201345
Implantable Lead Implant Date: 20200315
Implantable Lead Implant Date: 20200315
Implantable Lead Location: 753859
Implantable Lead Location: 753860
Implantable Lead Model: 5076
Implantable Lead Model: 5076
Implantable Pulse Generator Implant Date: 20200315
Lead Channel Impedance Value: 400 Ohm
Lead Channel Impedance Value: 460 Ohm
Lead Channel Pacing Threshold Amplitude: 0.75 V
Lead Channel Pacing Threshold Amplitude: 1.75 V
Lead Channel Pacing Threshold Pulse Width: 0.4 ms
Lead Channel Pacing Threshold Pulse Width: 0.4 ms
Lead Channel Sensing Intrinsic Amplitude: 1.8 mV
Lead Channel Sensing Intrinsic Amplitude: 12 mV
Lead Channel Setting Pacing Amplitude: 3.5 V
Lead Channel Setting Pacing Amplitude: 5 V
Lead Channel Setting Pacing Pulse Width: 0.4 ms
Lead Channel Setting Sensing Sensitivity: 4 mV
Pulse Gen Model: 2272
Pulse Gen Serial Number: 9118929

## 2019-05-28 NOTE — Telephone Encounter (Signed)
Call back [phone] ok to leave message.

## 2019-05-28 NOTE — Telephone Encounter (Signed)
Called [facility] and provided verbal order.

## 2019-05-28 NOTE — Telephone Encounter (Signed)
Spoke with [facility] and provided verbal orders for PT.

## 2019-05-28 NOTE — Telephone Encounter (Signed)
Called Jerilynn and left VM to call the office.

## 2019-05-28 NOTE — Telephone Encounter (Signed)
Jerilynn from [facility] called to inquire about fax

## 2019-05-30 ENCOUNTER — Other Ambulatory Visit: Payer: Self-pay

## 2019-05-30 ENCOUNTER — Telehealth: Payer: Self-pay | Admitting: Cardiology

## 2019-05-30 ENCOUNTER — Encounter: Payer: Self-pay | Admitting: Internal Medicine

## 2019-05-30 ENCOUNTER — Telehealth (INDEPENDENT_AMBULATORY_CARE_PROVIDER_SITE_OTHER): Payer: Medicare Other | Admitting: Internal Medicine

## 2019-05-30 VITALS — BP 110/58 | Ht 65.0 in | Wt 131.2 lb

## 2019-05-30 DIAGNOSIS — I5032 Chronic diastolic (congestive) heart failure: Secondary | ICD-10-CM | POA: Diagnosis not present

## 2019-05-30 DIAGNOSIS — I10 Essential (primary) hypertension: Secondary | ICD-10-CM

## 2019-05-30 DIAGNOSIS — I480 Paroxysmal atrial fibrillation: Secondary | ICD-10-CM

## 2019-05-30 DIAGNOSIS — R001 Bradycardia, unspecified: Secondary | ICD-10-CM

## 2019-05-30 NOTE — Telephone Encounter (Addendum)
Spoke with daughter, patient has flip phone  Emial IPAD for visit next week Preferred email address [email or web address] Secondary emial address [email or web address]  Daughter stated that she had issues with getting email with another office virtual visit so secondary email address given.

## 2019-05-30 NOTE — Progress Notes (Signed)
Electrophysiology TeleHealth Note   Due to national recommendations of social distancing due to COVID 19, an audio/video telehealth visit is felt to be most appropriate for this patient at this time.  See MyChart message from today for the patient's consent to telehealth for [facility].   Date:  05/30/2019   ID:  Andre [name], DOB 09/07/1923, MRN [id number]  Location: patient's home  Provider location: [address] [place]  Evaluation Performed: Follow-up visit  PCP:  [name], MD  Cardiologist:   [facility] Electrophysiologist:  SK   Chief Complaint:  dysppnea and complete heart block  History of Present Illness:    Andre [name] is a 85 y.o. male who presents via audio/video conferencing for a telehealth visit today.  Since last being seen in our clinic, the patient reports having been hospitaized on two occassions, once at [facility] and [facility] he was intubated  CXR reports reviewed and concerning for pneumonia,--no BNP in the data set Information 2nd hand ( as no visitors) was that he responded relatively quickly to diuretics but has had had increasing O2 requirements now at 5 L with SATs..44's notwithstanding w exertion  No pulmonolgoist  No edema  When hospitalized 3/20 for heart block and atrial arrhyhtmias, he was started on amiodarone Date Cr K Hgb TSH LFTs  6/20 1.45 3.6 8.2 0.8 15                 The patient denies symptoms of fevers, chills, cough, or new SOB worrisome for COVID 19.   Past Medical History:  Diagnosis Date  . Acute on chronic respiratory failure with hypoxia ([place])   . Adenomatous colon polyp 04/1985   no further colonoscopy, 2008-last colonoscopy, no polyps    . Atrial fibrillation ([place])   . Chronic atrial fibrillation   . Chronic diastolic heart failure ([place])   . Diverticulosis   . History of skin cancer    dermatology every 6 months Dr. [name]  . Hyperlipidemia   . Hypertension   .  Lobar pneumonia, unspecified organism ([place])   . Macular degeneration    bilateral  . Nephrolithiasis   . PAF (paroxysmal atrial fibrillation) ([place])   . PAT (paroxysmal atrial tachycardia) (HCC)    many years ago, worse with smoking  . Severe sepsis [facility])     Past Surgical History:  Procedure Laterality Date  . CARDIOVERSION N/A 05/23/2016   Procedure: CARDIOVERSION;  Surgeon: Sanda Rhys Anchondo, MD;  Location: [facility] ENDOSCOPY;  Service: Cardiovascular;  Laterality: N/A;  . CARDIOVERSION N/A 03/01/2019   Procedure: CARDIOVERSION;  Surgeon: Lelon Perla, MD;  Location[facility] ENDOSCOPY;  Service: Cardiovascular;  Laterality: N/A;  . CATARACT EXTRACTION     bilateral  . INGUINAL HERNIA REPAIR    . PACEMAKER IMPLANT N/A 02/24/2019   Procedure: PACEMAKER IMPLANT;  Surgeon: Deboraha Sprang, MD;  Location: [facility] LAB;  Service: Cardiovascular;  Laterality: N/A;  . TEE WITHOUT CARDIOVERSION N/A 03/22/2016   Procedure: TRANSESOPHAGEAL ECHOCARDIOGRAM (TEE);  Surgeon: [name], MD;  Location: [facility];  Service: Cardiovascular;  Laterality: N/A;  . TEE WITHOUT CARDIOVERSION N/A 05/23/2016   Procedure: TRANSESOPHAGEAL ECHOCARDIOGRAM (TEE);  Surgeon: Sanda Kiaja Shorty, MD;  Location: [facility] ENDOSCOPY;  Service: Cardiovascular;  Laterality: N/A;  . TEE WITHOUT CARDIOVERSION N/A 03/01/2019   Procedure: TRANSESOPHAGEAL ECHOCARDIOGRAM (TEE);  Surgeon: Lelon Perla, MD;  Location[facility] ENDOSCOPY;  Service: Cardiovascular;  Laterality: N/A;  . TENDON REPAIR  12/11   right leg    Current Outpatient Medications  Medication Sig Dispense Refill  . albuterol (VENTOLIN HFA) 108 (90 Base) MCG/ACT inhaler Inhale 1 puff into the lungs every 4 (four) hours as needed for wheezing or shortness of breath. 1 Inhaler 0  . amiodarone (PACERONE) 200 MG tablet Take 1 tablet (200 mg total) by mouth daily. 30 tablet 2  . amLODipine (NORVASC) 5 MG tablet Take 1 tablet (5 mg total) by mouth daily. 90 tablet 3  .  apixaban (ELIQUIS) 2.5 MG TABS tablet Take 1 tablet (2.5 mg total) by mouth 2 (two) times daily. 180 tablet 3  . atorvastatin (LIPITOR) 10 MG tablet Take 1 tablet (10 mg total) by mouth daily. 90 tablet 3  . furosemide (LASIX) 20 MG tablet Take 1 tablet (20 mg total) by mouth daily. 90 tablet 3  . losartan-hydrochlorothiazide (HYZAAR) 100-25 MG tablet Take 1 tablet by mouth daily. 90 tablet 3  . Potassium Chloride ER 20 MEQ TBCR Take 20 mEq by mouth daily as needed (as long as taking lasix). 90 tablet 3   No current facility-administered medications for this visit.     Allergies:   Patient has no known allergies.   Social History:  The patient  reports that he quit smoking about 15 years ago. His smoking use included cigarettes. He has a 60.00 pack-year smoking history. He has never used smokeless tobacco. He reports current alcohol use. He reports that he does not use drugs.   Family History:  The patient's   family history includes Cancer in his father; Hypertension in his mother.   ROS:  Please see the history of present illness.   All other systems are personally reviewed and negative.    Exam:    Vital Signs:  BP (!) 110/58   Ht 5\' 5"  (1.651 m)   Wt 131 lb 3.2 oz (59.5 kg)   BMI 21.83 kg/m     Well appearing, alert and conversant, regular work of breathing,  good skin color Eyes- anicteric, neuro- grossly intact, skin- no apparent rash or lesions or cyanosis, mouth- oral mucosa is pink   Labs/Other Tests and Data Reviewed:    Recent Labs: 05/15/2019: B Natriuretic Peptide 1,112.4 05/16/2019: ALT 19; Magnesium 2.0; TSH 0.808 05/20/2019: Hemoglobin 8.4; Platelets 537 05/21/2019: BUN 23; Creatinine, Ser 1.49; Potassium 4.1; Sodium 136   Wt Readings from Last 3 Encounters:  05/30/19 131 lb 3.2 oz (59.5 kg)  05/27/19 128 lb (58.1 kg)  05/21/19 126 lb 12.8 oz (57.5 kg)     Other studies personally reviewed: Additional studies/ records that were reviewed today include: hosptial  records  Review of the above records today demonstrates: As above  Prior radiographs: *See Below     Last device remote is reviewed from [facility] PDF dated 6/20  which reveals normal device function,   arrhythmias - none    ASSESSMENT & PLAN:    Complete heart block  Atrial fib persistent s/p DCCV  Amiodarone Tx  Pacemaker St Jude  ( High RV pacing outputs)  Anemia  Subacute 12.5>>8.2  O2 dependence COPD  emphysema (CXR report- but no retrosternal air 3/20  Personally reviewed  )  CHF chronic diastolic  Pt continues with profound dyspnea which I suspect is multifactorial Agree with HHN to keep him on 5 L and O2 sats>> 80s with walking-- suspect some degree of CHF  No interval AFib, some farfield oversensing with false positive mode switch    Will increase  lasix 40 mg>>bid   Needs BNP  Also significant anemia new since 3/20  Will ask S Hunter to add aBNP and fe studies to lab work to be drawn by home health  Unlikley but amio may also be contributing and would have a relatively low threshold for stopping amio if after diuresis and anemia his ) 2 requirements persist  Needs reprogramming of his RV/RA output as they are very high-- plan to do this in about 3 months     COVID 19 screen The patient denies symptoms of COVID 19 at this time.  The importance of social distancing was discussed today.  Follow-up:  66m  Next remote: As Scheduled   Current medicines are reviewed at length with the patient today.   The patient does not have concerns regarding his medicines.  The following changes were made today:  none  Labs/ tests ordered today include: As above  No orders of the defined types were placed in this encounter.   Future tests ( post COVID )    in    months  Patient Risk:  after full review of this patients clinical status, I feel that they are at moderate risk at this time.  Today, I have spent 29 minutes with the patient with telehealth technology  discussing the above.  Signed, Virl Axe, MD  05/30/2019 4:37 PM     [place] [address] [phone] (office) [phone] (fax)

## 2019-05-30 NOTE — Telephone Encounter (Signed)
  Daughter was asked to call [facility] to set up Mr Cocuzza's virtual appt next week on 06/06/19

## 2019-05-30 NOTE — Progress Notes (Signed)
Dr. Caryl Comes,   From my last note we were requesting "[facility] nurse -cbc, cmp, ferritin, b12, folate". Since he was normocytic I was also trying to check b12/folate.   I have not seen any results yet. Will try to forward to you when I get them.  Garret Reddish

## 2019-05-31 ENCOUNTER — Telehealth: Payer: Self-pay | Admitting: Cardiology

## 2019-05-31 MED ORDER — FUROSEMIDE 40 MG PO TABS: 40.0000 mg | ORAL_TABLET | Freq: Two times a day (BID) | ORAL | 3 refills | Status: DC

## 2019-05-31 NOTE — Telephone Encounter (Signed)
contact daughter to set up Video/ consent/ my chart/ pre reg completed

## 2019-06-02 ENCOUNTER — Encounter: Payer: Self-pay | Admitting: Family Medicine

## 2019-06-02 ENCOUNTER — Other Ambulatory Visit: Payer: Self-pay

## 2019-06-02 ENCOUNTER — Emergency Department (HOSPITAL_COMMUNITY): Payer: Medicare Other

## 2019-06-02 ENCOUNTER — Encounter (HOSPITAL_COMMUNITY): Payer: Self-pay | Admitting: Internal Medicine

## 2019-06-02 ENCOUNTER — Inpatient Hospital Stay (HOSPITAL_COMMUNITY)
Admission: EM | Admit: 2019-06-02 | Discharge: 2019-06-05 | DRG: 291 | Disposition: A | Discharge status: Home Health | Payer: Medicare Other | Attending: Internal Medicine | Admitting: Internal Medicine

## 2019-06-02 DIAGNOSIS — N170 Acute kidney failure with tubular necrosis: Secondary | ICD-10-CM | POA: Diagnosis not present

## 2019-06-02 DIAGNOSIS — R531 Weakness: Secondary | ICD-10-CM | POA: Diagnosis not present

## 2019-06-02 DIAGNOSIS — E785 Hyperlipidemia, unspecified: Secondary | ICD-10-CM | POA: Diagnosis present

## 2019-06-02 DIAGNOSIS — Z8249 Family history of ischemic heart disease and other diseases of the circulatory system: Secondary | ICD-10-CM

## 2019-06-02 DIAGNOSIS — J9601 Acute respiratory failure with hypoxia: Secondary | ICD-10-CM | POA: Diagnosis not present

## 2019-06-02 DIAGNOSIS — I5023 Acute on chronic systolic (congestive) heart failure: Secondary | ICD-10-CM

## 2019-06-02 DIAGNOSIS — R1310 Dysphagia, unspecified: Secondary | ICD-10-CM | POA: Diagnosis not present

## 2019-06-02 DIAGNOSIS — I959 Hypotension, unspecified: Secondary | ICD-10-CM | POA: Diagnosis not present

## 2019-06-02 DIAGNOSIS — J90 Pleural effusion, not elsewhere classified: Secondary | ICD-10-CM

## 2019-06-02 DIAGNOSIS — I480 Paroxysmal atrial fibrillation: Secondary | ICD-10-CM | POA: Diagnosis present

## 2019-06-02 DIAGNOSIS — N172 Acute kidney failure with medullary necrosis: Secondary | ICD-10-CM | POA: Diagnosis not present

## 2019-06-02 DIAGNOSIS — Z87891 Personal history of nicotine dependence: Secondary | ICD-10-CM | POA: Diagnosis not present

## 2019-06-02 DIAGNOSIS — N179 Acute kidney failure, unspecified: Secondary | ICD-10-CM | POA: Diagnosis not present

## 2019-06-02 DIAGNOSIS — I4892 Unspecified atrial flutter: Secondary | ICD-10-CM | POA: Diagnosis present

## 2019-06-02 DIAGNOSIS — J431 Panlobular emphysema: Secondary | ICD-10-CM | POA: Diagnosis not present

## 2019-06-02 DIAGNOSIS — R0602 Shortness of breath: Secondary | ICD-10-CM | POA: Diagnosis not present

## 2019-06-02 DIAGNOSIS — Z950 Presence of cardiac pacemaker: Secondary | ICD-10-CM | POA: Diagnosis not present

## 2019-06-02 DIAGNOSIS — M6281 Muscle weakness (generalized): Secondary | ICD-10-CM | POA: Diagnosis not present

## 2019-06-02 DIAGNOSIS — J849 Interstitial pulmonary disease, unspecified: Secondary | ICD-10-CM | POA: Diagnosis not present

## 2019-06-02 DIAGNOSIS — D649 Anemia, unspecified: Secondary | ICD-10-CM | POA: Diagnosis present

## 2019-06-02 DIAGNOSIS — N183 Chronic kidney disease, stage 3 unspecified: Secondary | ICD-10-CM | POA: Diagnosis present

## 2019-06-02 DIAGNOSIS — I10 Essential (primary) hypertension: Secondary | ICD-10-CM | POA: Diagnosis present

## 2019-06-02 DIAGNOSIS — I4811 Longstanding persistent atrial fibrillation: Secondary | ICD-10-CM | POA: Diagnosis not present

## 2019-06-02 DIAGNOSIS — Z7901 Long term (current) use of anticoagulants: Secondary | ICD-10-CM | POA: Diagnosis not present

## 2019-06-02 DIAGNOSIS — Z20828 Contact with and (suspected) exposure to other viral communicable diseases: Secondary | ICD-10-CM | POA: Diagnosis present

## 2019-06-02 DIAGNOSIS — Z515 Encounter for palliative care: Secondary | ICD-10-CM | POA: Diagnosis not present

## 2019-06-02 DIAGNOSIS — I4891 Unspecified atrial fibrillation: Secondary | ICD-10-CM | POA: Diagnosis present

## 2019-06-02 DIAGNOSIS — I5033 Acute on chronic diastolic (congestive) heart failure: Secondary | ICD-10-CM | POA: Diagnosis present

## 2019-06-02 DIAGNOSIS — J918 Pleural effusion in other conditions classified elsewhere: Secondary | ICD-10-CM | POA: Diagnosis present

## 2019-06-02 DIAGNOSIS — E46 Unspecified protein-calorie malnutrition: Secondary | ICD-10-CM | POA: Diagnosis not present

## 2019-06-02 DIAGNOSIS — I5032 Chronic diastolic (congestive) heart failure: Secondary | ICD-10-CM | POA: Diagnosis not present

## 2019-06-02 DIAGNOSIS — I482 Chronic atrial fibrillation, unspecified: Secondary | ICD-10-CM | POA: Diagnosis not present

## 2019-06-02 DIAGNOSIS — I9589 Other hypotension: Secondary | ICD-10-CM | POA: Diagnosis not present

## 2019-06-02 DIAGNOSIS — J9621 Acute and chronic respiratory failure with hypoxia: Secondary | ICD-10-CM | POA: Diagnosis present

## 2019-06-02 DIAGNOSIS — J439 Emphysema, unspecified: Secondary | ICD-10-CM | POA: Diagnosis present

## 2019-06-02 DIAGNOSIS — I4819 Other persistent atrial fibrillation: Secondary | ICD-10-CM | POA: Diagnosis not present

## 2019-06-02 DIAGNOSIS — I130 Hypertensive heart and chronic kidney disease with heart failure and stage 1 through stage 4 chronic kidney disease, or unspecified chronic kidney disease: Principal | ICD-10-CM | POA: Diagnosis present

## 2019-06-02 DIAGNOSIS — I442 Atrioventricular block, complete: Secondary | ICD-10-CM | POA: Diagnosis present

## 2019-06-02 DIAGNOSIS — Z9981 Dependence on supplemental oxygen: Secondary | ICD-10-CM

## 2019-06-02 DIAGNOSIS — E7800 Pure hypercholesterolemia, unspecified: Secondary | ICD-10-CM | POA: Diagnosis not present

## 2019-06-02 DIAGNOSIS — Z7189 Other specified counseling: Secondary | ICD-10-CM | POA: Diagnosis not present

## 2019-06-02 LAB — COMPREHENSIVE METABOLIC PANEL
ALT: 18 U/L (ref 0–44)
AST: 33 U/L (ref 15–41)
Albumin: 2.3 g/dL — ABNORMAL LOW (ref 3.5–5.0)
Alkaline Phosphatase: 63 U/L (ref 38–126)
Anion gap: 10 (ref 5–15)
BUN: 29 mg/dL — ABNORMAL HIGH (ref 8–23)
CO2: 24 mmol/L (ref 22–32)
Calcium: 8.7 mg/dL — ABNORMAL LOW (ref 8.9–10.3)
Chloride: 101 mmol/L (ref 98–111)
Creatinine, Ser: 1.84 mg/dL — ABNORMAL HIGH (ref 0.61–1.24)
GFR calc Af Amer: 36 mL/min — ABNORMAL LOW (ref >60)
GFR calc non Af Amer: 31 mL/min — ABNORMAL LOW (ref >60)
Glucose, Bld: 114 mg/dL — ABNORMAL HIGH (ref 70–99)
Potassium: 4.8 mmol/L (ref 3.5–5.1)
Sodium: 135 mmol/L (ref 135–145)
Total Bilirubin: 1.1 mg/dL (ref 0.3–1.2)
Total Protein: 6.3 g/dL — ABNORMAL LOW (ref 6.5–8.1)

## 2019-06-02 LAB — CBC WITH DIFFERENTIAL/PLATELET
Abs Immature Granulocytes: 0.08 10*3/uL — ABNORMAL HIGH (ref 0.00–0.07)
Basophils Absolute: 0.1 10*3/uL (ref 0.0–0.1)
Basophils Relative: 0 %
Eosinophils Absolute: 0.2 10*3/uL (ref 0.0–0.5)
Eosinophils Relative: 1 %
HCT: 27.8 % — ABNORMAL LOW (ref 39.0–52.0)
Hemoglobin: 8.5 g/dL — ABNORMAL LOW (ref 13.0–17.0)
Immature Granulocytes: 1 %
Lymphocytes Relative: 12 %
Lymphs Abs: 1.7 10*3/uL (ref 0.7–4.0)
MCH: 26.5 pg (ref 26.0–34.0)
MCHC: 30.6 g/dL (ref 30.0–36.0)
MCV: 86.6 fL (ref 80.0–100.0)
Monocytes Absolute: 0.9 10*3/uL (ref 0.1–1.0)
Monocytes Relative: 7 %
Neutro Abs: 11.1 10*3/uL — ABNORMAL HIGH (ref 1.7–7.7)
Neutrophils Relative %: 79 %
Platelets: 497 10*3/uL — ABNORMAL HIGH (ref 150–400)
RBC: 3.21 MIL/uL — ABNORMAL LOW (ref 4.22–5.81)
RDW: 16.8 % — ABNORMAL HIGH (ref 11.5–15.5)
WBC: 14.1 10*3/uL — ABNORMAL HIGH (ref 4.0–10.5)
nRBC: 0 % (ref 0.0–0.2)

## 2019-06-02 LAB — LACTIC ACID, PLASMA
Lactic Acid, Venous: 1.9 mmol/L (ref 0.5–1.9)
Lactic Acid, Venous: 0.8 mmol/L (ref 0.5–1.9)

## 2019-06-02 LAB — BRAIN NATRIURETIC PEPTIDE: B Natriuretic Peptide: 363.6 pg/mL — ABNORMAL HIGH (ref 0.0–100.0)

## 2019-06-02 LAB — SARS CORONAVIRUS 2 BY RT PCR (HOSPITAL ORDER, PERFORMED IN ~~LOC~~ HOSPITAL LAB): SARS Coronavirus 2: NEGATIVE

## 2019-06-02 LAB — TROPONIN I: Troponin I: 0.04 ng/mL (ref <0.03)

## 2019-06-02 MED ORDER — ATORVASTATIN CALCIUM 10 MG PO TABS
10.0000 mg | ORAL_TABLET | Freq: Every day | ORAL | Status: DC
  Administered 2019-06-03 – 2019-06-05 (×3): 10 mg via ORAL
  Filled 2019-06-02 (×3): qty 1

## 2019-06-02 MED ORDER — HYDROCHLOROTHIAZIDE 12.5 MG PO CAPS: 12.5000 mg | ORAL_CAPSULE | Freq: Every day | ORAL | Status: DC

## 2019-06-02 MED ORDER — LOSARTAN POTASSIUM 50 MG PO TABS: 100.0000 mg | ORAL_TABLET | Freq: Every day | ORAL | Status: DC

## 2019-06-02 MED ORDER — POTASSIUM CHLORIDE CRYS ER 20 MEQ PO TBCR
20.0000 meq | EXTENDED_RELEASE_TABLET | Freq: Every day | ORAL | Status: DC
  Administered 2019-06-03 – 2019-06-05 (×3): 20 meq via ORAL
  Filled 2019-06-02 (×3): qty 1

## 2019-06-02 MED ORDER — FUROSEMIDE 10 MG/ML IJ SOLN
40.0000 mg | Freq: Once | INTRAMUSCULAR | Status: AC
  Administered 2019-06-02: 40 mg via INTRAVENOUS
  Filled 2019-06-02: qty 4

## 2019-06-02 MED ORDER — LOSARTAN POTASSIUM 50 MG PO TABS
100.0000 mg | ORAL_TABLET | Freq: Every day | ORAL | Status: DC
  Administered 2019-06-03 – 2019-06-05 (×3): 100 mg via ORAL
  Filled 2019-06-02 (×3): qty 2

## 2019-06-02 MED ORDER — AMLODIPINE BESYLATE 5 MG PO TABS
5.0000 mg | ORAL_TABLET | Freq: Every day | ORAL | Status: DC
  Administered 2019-06-03 – 2019-06-05 (×3): 5 mg via ORAL
  Filled 2019-06-02 (×3): qty 1

## 2019-06-02 MED ORDER — LOSARTAN POTASSIUM-HCTZ 100-25 MG PO TABS: 1.0000 | ORAL_TABLET | Freq: Every day | ORAL | Status: DC

## 2019-06-02 MED ORDER — ALBUTEROL SULFATE (2.5 MG/3ML) 0.083% IN NEBU: 2.5000 mg | INHALATION_SOLUTION | RESPIRATORY_TRACT | Status: DC | PRN

## 2019-06-02 MED ORDER — AMIODARONE HCL 200 MG PO TABS
200.0000 mg | ORAL_TABLET | Freq: Every day | ORAL | Status: DC
  Administered 2019-06-03 – 2019-06-05 (×3): 200 mg via ORAL
  Filled 2019-06-02 (×3): qty 1

## 2019-06-02 MED ORDER — HYDROCHLOROTHIAZIDE 25 MG PO TABS
25.0000 mg | ORAL_TABLET | Freq: Every day | ORAL | Status: DC
  Administered 2019-06-03 – 2019-06-05 (×3): 25 mg via ORAL
  Filled 2019-06-02 (×3): qty 1

## 2019-06-02 MED ORDER — ACETAMINOPHEN 650 MG RE SUPP: 650.0000 mg | Freq: Four times a day (QID) | RECTAL | Status: DC | PRN

## 2019-06-02 MED ORDER — ONDANSETRON HCL 4 MG PO TABS: 4.0000 mg | ORAL_TABLET | Freq: Four times a day (QID) | ORAL | Status: DC | PRN

## 2019-06-02 MED ORDER — HEPARIN (PORCINE) 25000 UT/250ML-% IV SOLN
1150.0000 [IU]/h | INTRAVENOUS | Status: DC
  Administered 2019-06-03: 850 [IU]/h via INTRAVENOUS
  Administered 2019-06-04: 1150 [IU]/h via INTRAVENOUS
  Administered 2019-06-04: 1000 [IU]/h via INTRAVENOUS
  Filled 2019-06-02 (×3): qty 250

## 2019-06-02 MED ORDER — FUROSEMIDE 10 MG/ML IJ SOLN
60.0000 mg | Freq: Two times a day (BID) | INTRAMUSCULAR | Status: DC
  Administered 2019-06-02 – 2019-06-05 (×5): 60 mg via INTRAVENOUS
  Filled 2019-06-02 (×6): qty 6

## 2019-06-02 MED ORDER — ACETAMINOPHEN 325 MG PO TABS: 650.0000 mg | ORAL_TABLET | Freq: Four times a day (QID) | ORAL | Status: DC | PRN

## 2019-06-02 MED ORDER — ONDANSETRON HCL 4 MG/2ML IJ SOLN: 4.0000 mg | Freq: Four times a day (QID) | INTRAMUSCULAR | Status: DC | PRN

## 2019-06-02 NOTE — ED Provider Notes (Signed)
[facility] DEPARTMENT Provider Note   CSN: [id number] Arrival date & time: 06/02/19  1534    History   Chief Complaint Chief Complaint  Patient presents with  . Shortness of Breath    HPI Andre [name] is a 85 y.o. male.     HPI   85 year old male with a history of complete heart block status post pacemaker placement, hyperlipidemia, hypertension, chronic diastolic CHF, CKD stage III, atrial fibrillation, chronic respiratory failure on 3 to 4 L/min of oxygen at baseline, admission in April with congestive heart failure requiring intubation, presents with concern for worsening shortness of breath.  Patient had an admission in June for similar.  Reports the symptoms developed 1 week ago, but significantly worsened last night.  Reports that the done to the bathroom is just a few feet, and when he walked to the bathroom he had to sit there 5 or 10 minutes to catch his breath.  Reports his 3 children were with him today and they recommended he come to the hospital.  Reports that if he sits straight up his symptoms are better than when he is lying down or sludging.  Reports he is coughing up clear sputum.  No fevers.  He checks his weights related to his CHF and they have not increased.  He has not paid attention to leg swelling.  He takes Lasix and has not missed any doses.  Denies other body aches.  Past Medical History:  Diagnosis Date  . Acute on chronic respiratory failure with hypoxia ([place])   . Adenomatous colon polyp 04/1985   no further colonoscopy, 2008-last colonoscopy, no polyps    . Atrial fibrillation ([place])   . Chronic atrial fibrillation   . Chronic diastolic heart failure ([place])   . Diverticulosis   . History of skin cancer    dermatology every 6 months Dr. [name]  . Hyperlipidemia   . Hypertension   . Lobar pneumonia, unspecified organism ([place])   . Macular degeneration    bilateral  . Nephrolithiasis   . PAF (paroxysmal atrial  fibrillation) ([place])   . PAT (paroxysmal atrial tachycardia) (HCC)    many years ago, worse with smoking  . Severe sepsis [facility])     Patient Active Problem List   Diagnosis Date Noted  . Acute respiratory failure with hypoxia ([place]) 06/02/2019  . CKD (chronic kidney disease) stage 3, GFR 30-59 ml/min (HCC) 06/02/2019  . Hypokalemia   . Acute exacerbation of CHF (congestive heart failure) ([place]) 05/15/2019  . Hypoalbuminemia 05/15/2019  . Acute on chronic heart failure ([place]) 05/15/2019  . Acute on chronic diastolic CHF (congestive heart failure) ([place]) 05/15/2019  . Acute on chronic diastolic CHF (congestive heart failure) ([place]) 05/15/2019  . Acute on chronic respiratory failure with hypoxia ([place])   . Severe sepsis ([place])   . Chronic diastolic heart failure ([place])   . Lobar pneumonia, unspecified organism ([place])   . Chronic atrial fibrillation   . Pacemaker 03/12/2019  . Hypoxia 03/12/2019  . Pressure injury of skin 02/27/2019  . CHB (complete heart block) ([facility]) 02/23/2019  . Peripheral vascular disease ([place]) 04/21/2016  . Atrial fibrillation/flutter 03/17/2016  . Acute on chronic diastolic heart failure ([place]) 01/05/2016  . Macular degeneration 02/17/2015  . Acute-on-chronic kidney disease stage III 02/17/2015  . Former smoker 02/17/2015  . History of skin cancer   . Malignant neoplasm of prostate ([place]) 01/05/2010  . Hyperlipidemia 12/04/2007  . Essential hypertension 12/04/2007  . Angiodysplasia of  intestine with hemorrhage 05/15/2007    Past Surgical History:  Procedure Laterality Date  . CARDIOVERSION N/A 05/23/2016   Procedure: CARDIOVERSION;  Surgeon: [name], MD;  Location: [facility] ENDOSCOPY;  Service: Cardiovascular;  Laterality: N/A;  . CARDIOVERSION N/A 03/01/2019   Procedure: CARDIOVERSION;  Surgeon: Lelon Perla, MD;  Location: [facility] ENDOSCOPY;  Service: Cardiovascular;  Laterality: N/A;  . CATARACT EXTRACTION     bilateral  . INGUINAL HERNIA REPAIR    . PACEMAKER  IMPLANT N/A 02/24/2019   Procedure: PACEMAKER IMPLANT;  Surgeon: Deboraha Sprang, MD;  Location: [facility] CV LAB;  Service: Cardiovascular;  Laterality: N/A;  . TEE WITHOUT CARDIOVERSION N/A 03/22/2016   Procedure: TRANSESOPHAGEAL ECHOCARDIOGRAM (TEE);  Surgeon: [name], MD;  Location: [facility];  Service: Cardiovascular;  Laterality: N/A;  . TEE WITHOUT CARDIOVERSION N/A 05/23/2016   Procedure: TRANSESOPHAGEAL ECHOCARDIOGRAM (TEE);  Surgeon: [name], MD;  Location: [facility] ENDOSCOPY;  Service: Cardiovascular;  Laterality: N/A;  . TEE WITHOUT CARDIOVERSION N/A 03/01/2019   Procedure: TRANSESOPHAGEAL ECHOCARDIOGRAM (TEE);  Surgeon: Lelon Perla, MD;  Location: [facility] ENDOSCOPY;  Service: Cardiovascular;  Laterality: N/A;  . TENDON REPAIR  12/11   right leg        Home Medications    Prior to Admission medications   Medication Sig Start Date End Date Taking? Authorizing Provider  albuterol (VENTOLIN HFA) 108 (90 Base) MCG/ACT inhaler Inhale 1 puff into the lungs every 4 (four) hours as needed for wheezing or shortness of breath. 05/21/19   Shelly Coss, MD  amiodarone (PACERONE) 200 MG tablet Take 1 tablet (200 mg total) by mouth daily. 05/24/19   Lelon Perla, MD  amLODipine (NORVASC) 5 MG tablet Take 1 tablet (5 mg total) by mouth daily. 05/27/19   [name], MD  apixaban (ELIQUIS) 2.5 MG TABS tablet Take 1 tablet (2.5 mg total) by mouth 2 (two) times daily. 05/27/19   [name], MD  atorvastatin (LIPITOR) 10 MG tablet Take 1 tablet (10 mg total) by mouth daily. 05/27/19   [name], MD  furosemide (LASIX) 40 MG tablet Take 1 tablet (40 mg total) by mouth 2 (two) times daily. 05/31/19 05/25/20  Deboraha Sprang, MD  losartan-hydrochlorothiazide (HYZAAR) 100-25 MG tablet Take 1 tablet by mouth daily. 05/27/19   [name], MD  Potassium Chloride ER 20 MEQ TBCR Take 20 mEq by mouth daily as needed (as long as taking lasix). 05/27/19   [name], MD    Family History Family History  Problem Relation Age of Onset  . Hypertension Mother   . Cancer Father        lung    Social History Social History   Tobacco Use  . Smoking status: Former Smoker    Packs/day: 1.00    Years: 60.00    Pack years: 60.00    Types: Cigarettes    Quit date: 12/13/2003    Years since quitting: 15.4  . Smokeless tobacco: Never Used  Substance Use Topics  . Alcohol use: Yes    Alcohol/week: 0.0 standard drinks    Comment: 2 glass of wine a day  . Drug use: No     Allergies   Patient has no known allergies.   Review of Systems Review of Systems  Constitutional: Negative for fever.  HENT: Negative for sore throat.   Eyes: Negative for visual disturbance.  Respiratory: Positive for cough and shortness of breath.   Cardiovascular: Negative for chest  pain.  Gastrointestinal: Negative for abdominal pain, nausea and vomiting.  Genitourinary: Negative for difficulty urinating.  Musculoskeletal: Negative for back pain and neck stiffness.  Skin: Negative for rash.  Neurological: Negative for syncope and headaches.     Physical Exam Updated Vital Signs BP (!) 154/63   Pulse 88   Temp 98.2 F (36.8 C) (Axillary)   Resp (!) 27   Ht 5' 5.75" (1.67 m)   Wt 59.4 kg   SpO2 96%   BMI 21.31 kg/m   Physical Exam Vitals signs and nursing note reviewed.  Constitutional:      General: He is not in acute distress.    Appearance: He is well-developed. He is not diaphoretic.  HENT:     Head: Normocephalic and atraumatic.  Eyes:     Conjunctiva/sclera: Conjunctivae normal.  Neck:     Musculoskeletal: Normal range of motion.  Cardiovascular:     Rate and Rhythm: Normal rate and regular rhythm.     Heart sounds: Normal heart sounds. No murmur. No friction rub. No gallop.   Pulmonary:     Effort: Pulmonary effort is normal. No respiratory distress.     Breath sounds: Examination of the right-upper field reveals decreased breath sounds.  Examination of the left-upper field reveals rales. Examination of the right-middle field reveals decreased breath sounds. Examination of the left-middle field reveals rales. Examination of the right-lower field reveals decreased breath sounds. Examination of the left-lower field reveals rales. Decreased breath sounds and rales present. No wheezing.  Abdominal:     General: There is no distension.     Palpations: Abdomen is soft.     Tenderness: There is no abdominal tenderness. There is no guarding.  Skin:    General: Skin is warm and dry.  Neurological:     Mental Status: He is alert and oriented to person, place, and time.      ED Treatments / Results  Labs (all labs ordered are listed, but only abnormal results are displayed) Labs Reviewed  CBC WITH DIFFERENTIAL/PLATELET - Abnormal; Notable for the following components:      Result Value   WBC 14.1 (*)    RBC 3.21 (*)    Hemoglobin 8.5 (*)    HCT 27.8 (*)    RDW 16.8 (*)    Platelets 497 (*)    Neutro Abs 11.1 (*)    Abs Immature Granulocytes 0.08 (*)    All other components within normal limits  COMPREHENSIVE METABOLIC PANEL - Abnormal; Notable for the following components:   Glucose, Bld 114 (*)    BUN 29 (*)    Creatinine, Ser 1.84 (*)    Calcium 8.7 (*)    Total Protein 6.3 (*)    Albumin 2.3 (*)    GFR calc non Af Amer 31 (*)    GFR calc Af Amer 36 (*)    All other components within normal limits  BRAIN NATRIURETIC PEPTIDE - Abnormal; Notable for the following components:   B Natriuretic Peptide 363.6 (*)    All other components within normal limits  TROPONIN I - Abnormal; Notable for the following components:   Troponin I 0.04 (*)    All other components within normal limits  SARS CORONAVIRUS 2 (HOSPITAL ORDER, [facility])  LACTIC ACID, PLASMA  LACTIC ACID, PLASMA  COMPREHENSIVE METABOLIC PANEL  MAGNESIUM  CBC WITH DIFFERENTIAL/PLATELET  TROPONIN I  TROPONIN I  TROPONIN I   HEPARIN LEVEL (UNFRACTIONATED)  APTT  EKG EKG Interpretation  Date/Time:  "Sunday June 02 2019 15:47:37 EDT Ventricular Rate:  82 PR Interval:    QRS Duration: 196 QT Interval:  464 QTC Calculation: 542 R Axis:   -67 Text Interpretation:  Sinus or ectopic atrial rhythm LVH with IVCD, LAD and secondary repol abnrm Prolonged QT interval No significant change since last tracing Confirmed by Neyland Pettengill ([id number]) on 06/02/2019 4:41:17 PM   Radiology Dg Chest Portable 1 View  Result Date: 06/02/2019 CLINICAL DATA:  Acute shortness of breath EXAM: PORTABLE CHEST 1 VIEW COMPARISON:  05/15/2019 FINDINGS: An enlarging RIGHT pleural effusion is noted, now moderate to large. Cardiomegaly and interstitial pulmonary edema again noted. Compressive RIGHT LOWER lung atelectasis noted. LEFT pacemaker again noted. No pneumothorax or acute bony abnormality. IMPRESSION: Enlarging RIGHT pleural effusion, now moderate to large. Interstitial pulmonary edema again noted. Electronically Signed   By: Jeffrey  Hu M.D.   On: 06/02/2019 17:20    Procedures .Critical Care Performed by: Amarri Satterly, MD Authorized by: Kayci [place], MD   Critical care provider statement:    Critical care time (minutes):  45   Critical care was time spent personally by me on the following activities:  Discussions with consultants, evaluation of patient's response to treatment, examination of patient, ordering and performing treatments and interventions, ordering and review of laboratory studies, ordering and review of radiographic studies, pulse oximetry, re-evaluation of patient's condition, obtaining history from patient or surrogate and review of old charts   (including critical care time)  Medications Ordered in ED Medications  amiodarone (PACERONE) tablet 200 mg (has no administration in time range)  amLODipine (NORVASC) tablet 5 mg (has no administration in time range)  atorvastatin (LIPITOR) tablet 10 mg (has  no administration in time range)  potassium chloride SA (K-DUR) CR tablet 20 mEq (has no administration in time range)  albuterol (PROVENTIL) (2.5 MG/3ML) 0.083% nebulizer solution 2.5 mg (has no administration in time range)  acetaminophen (TYLENOL) tablet 650 mg (has no administration in time range)    Or  acetaminophen (TYLENOL) suppository 650 mg (has no administration in time range)  ondansetron (ZOFRAN) tablet 4 mg (has no administration in time range)    Or  ondansetron (ZOFRAN) injection 4 mg (has no administration in time range)  furosemide (LASIX) injection 60 mg (60 mg Intravenous Given 06/02/19 2359)  losartan (COZAAR) tablet 100 mg (has no administration in time range)    And  hydrochlorothiazide (HYDRODIURIL) tablet 25 mg (has no administration in time range)  heparin ADULT infusion 100 units/mL (25000 units/250mL sodium chloride 0.45%) (has no administration in time range)  furosemide (LASIX) injection 40 mg (40 mg Intravenous Given 06/02/19 1709)     Initial Impression / Assessment and Plan / ED Course  I have reviewed the triage vital signs and the nursing notes.  Pertinent labs & imaging results that were available during my care of the patient were reviewed by me and considered in my medical decision making (see chart for details).        89"  year old male with a history of complete heart block status post pacemaker placement, hyperlipidemia, hypertension, chronic diastolic CHF, CKD stage III, atrial fibrillation, chronic respiratory failure on 3 to 5 L/min of oxygen at baseline, admission in April with congestive heart failure requiring intubation, presents with concern for worsening shortness of breath.  Exam and history concerning for CHF exacerbation and pleural effusion.  X-ray confirms these findings.  Given Lasix IV.  Patient hypoxic on his home 5 L  of oxygen, and requires nonrebreather in order to maintain his oxygen saturation.  Discussed possibility of BiPAP,  however he feels comfortable with the nonrebreather at this time.  At time of my evaluation, he is feeling improved on nonrebreather.  Discussed with Dr. Hal Hope, who recommended discussion with PCCM.  Discussed with Dr. James Ivanoff patient's presentation, and at this time we will continue plan for admission with diuresis to the stepdown unit, and plan for IR guided thoracentesis in the morning unless patient has a change in his status.  His COVID test is negative.  He is full code.  Final Clinical Impressions(s) / ED Diagnoses   Final diagnoses:  Acute on chronic systolic congestive heart failure (HCC)  Pleural effusion  Acute on chronic respiratory failure with hypoxia [facility])    ED Discharge Orders    None       Gareth Morgan, MD 06/03/19 [phone]

## 2019-06-02 NOTE — Progress Notes (Addendum)
ANTICOAGULATION CONSULT NOTE - Initial Consult  Pharmacy Consult for Heparin (Apixaban on hold) Indication: atrial fibrillation  No Known Allergies  Patient Measurements: Height: 5' 5.75" (167 cm) Weight: 131 lb (59.4 kg) IBW/kg (Calculated) : 63.23  Vital Signs: Temp: 98.2 F (36.8 C) (06/21 1551) Temp Source: Axillary (06/21 1551) BP: 132/46 (06/21 2315) Pulse Rate: 75 (06/21 2315)  Labs: Recent Labs    06/02/19 1705  HGB 8.5*  HCT 27.8*  PLT 497*  CREATININE 1.84*  TROPONINI 0.04*    Estimated Creatinine Clearance: 21.1 mL/min (A) (by C-G formula based on SCr of 1.84 mg/dL (H)).   Medical History: Past Medical History:  Diagnosis Date  . Acute on chronic respiratory failure with hypoxia ([place])   . Adenomatous colon polyp 04/1985   no further colonoscopy, 2008-last colonoscopy, no polyps    . Atrial fibrillation ([place])   . Chronic atrial fibrillation   . Chronic diastolic heart failure ([place])   . Diverticulosis   . History of skin cancer    dermatology every 6 months Dr. [name]  . Hyperlipidemia   . Hypertension   . Lobar pneumonia, unspecified organism ([place])   . Macular degeneration    bilateral  . Nephrolithiasis   . PAF (paroxysmal atrial fibrillation) ([place])   . PAT (paroxysmal atrial tachycardia) (HCC)    many years ago, worse with smoking  . Severe sepsis [facility])     Assessment: 85 y/o M presents to the ED with 2 day history of worsening shortness of breath, starting heparin and holding apixaban during acute illness, Hgb 8.5, platelets 497, noted renal dysfunction. Will be using aPTT to dose heparin for now given apixaban influence on heparin levels. It has been >12 hours since last apixaban dose, will start heparin now.   Goal of Therapy:  Heparin level 0.3-0.7 units/ml aPTT 66-102 seconds Monitor platelets by anticoagulation protocol: Yes   Plan:  Start heparin drip at 850 units/hr 0830 HL/aPTT Daily CBC/HL/aPTT Monitor for bleeding  Narda Bonds, PharmD, BCPS Clinical Pharmacist Phone: [phone]

## 2019-06-02 NOTE — ED Triage Notes (Signed)
Pt BIB [facility] for shortness of breath that started on Friday. Per EMS patient normally wears 3-5L of oxygen at home all the time. EMS reports patient was 80% on 5L upon their arrival. Pt denying any fevers, pain, nausea or vomiting. Pt 78% on 5L upon ED arrival. Pt 100% on non-rebreather at present time.

## 2019-06-02 NOTE — H&P (Signed)
History and Physical    Andre [name] IRS:[id number] DOB: 09-Jan-1923 DOA: 06/02/2019  PCP: [name], MD   Patient coming from: Home.  Chief Complaint: Shortness of breath.  HPI: Andre [name] is a 85 y.o. male with history of diastolic CHF last EF measured in March 2020 was 65%, chronic respiratory failure on home oxygen about 5 L, A. fib on amiodarone and apixaban, complete heart block status post pacemaker placement, chronic anemia presents to the ER because of worsening shortness of breath over the last couple of days.  Patient was recently admitted twice for CHF exacerbation.  During which patient also was intubated.  Patient states he was doing fine until last few days he became more short of breath denies any chest pain productive cough fever or chills.  He states he has been compliant with his Lasix and other medications.  Chart review shows that he had recently followed with cardiologist Dr. Caryl Comes.  ED Course: In the ER patient was hypoxic requiring 15 L oxygen and chest x-ray shows congestion with enlarging right-sided pleural effusion which at this time looks large.  Pulmonary critical care was consulted.  Patient was given Lasix 40 mg IV despite which patient was still short of breath I ordered another 60.  Patient admitted for acute respiratory failure with hypoxia likely from CHF exacerbation with right pleural effusion.  Other labs include creatinine of 1.8 hemoglobin 8.5 WBC 14.1 platelets 497 BNP 363 troponin 0.04 and EKG shows paced rhythm.  Review of Systems: As per HPI, rest all negative.   Past Medical History:  Diagnosis Date   Acute on chronic respiratory failure with hypoxia ([place])    Adenomatous colon polyp 04/1985   no further colonoscopy, 2008-last colonoscopy, no polyps     Atrial fibrillation (HCC)    Chronic atrial fibrillation    Chronic diastolic heart failure (HCC)    Diverticulosis    History of skin cancer    dermatology every 6  months Dr. [name]   Hyperlipidemia    Hypertension    Lobar pneumonia, unspecified organism ([place])    Macular degeneration    bilateral   Nephrolithiasis    PAF (paroxysmal atrial fibrillation) (HCC)    PAT (paroxysmal atrial tachycardia) ([place])    many years ago, worse with smoking   Severe sepsis [facility])     Past Surgical History:  Procedure Laterality Date   CARDIOVERSION N/A 05/23/2016   Procedure: CARDIOVERSION;  Surgeon: [name], MD;  Location: [place];  Service: Cardiovascular;  Laterality: N/A;   CARDIOVERSION N/A 03/01/2019   Procedure: CARDIOVERSION;  Surgeon: Lelon Perla, MD;  Location: [place];  Service: Cardiovascular;  Laterality: N/A;   CATARACT EXTRACTION     bilateral   INGUINAL HERNIA REPAIR     PACEMAKER IMPLANT N/A 02/24/2019   Procedure: PACEMAKER IMPLANT;  Surgeon: Deboraha Sprang, MD;  Location: [place] [facility];  Service: Cardiovascular;  Laterality: N/A;   TEE WITHOUT CARDIOVERSION N/A 03/22/2016   Procedure: TRANSESOPHAGEAL ECHOCARDIOGRAM (TEE);  Surgeon: [name], MD;  Location: [place];  Service: Cardiovascular;  Laterality: N/A;   TEE WITHOUT CARDIOVERSION N/A 05/23/2016   Procedure: TRANSESOPHAGEAL ECHOCARDIOGRAM (TEE);  Surgeon: [name], MD;  Location: [facility];  Service: Cardiovascular;  Laterality: N/A;   TEE WITHOUT CARDIOVERSION N/A 03/01/2019   Procedure: TRANSESOPHAGEAL ECHOCARDIOGRAM (TEE);  Surgeon: Lelon Perla, MD;  Location: [place];  Service: Cardiovascular;  Laterality: N/A;   TENDON REPAIR  12/11  right leg     reports that he quit smoking about 15 years ago. His smoking use included cigarettes. He has a 60.00 pack-year smoking history. He has never used smokeless tobacco. He reports current alcohol use. He reports that he does not use drugs.  No Known Allergies  Family History  Problem Relation Age of Onset   Hypertension Mother    Cancer Father        lung     Prior to Admission medications   Medication Sig Start Date End Date Taking? Authorizing Provider  albuterol (VENTOLIN HFA) 108 (90 Base) MCG/ACT inhaler Inhale 1 puff into the lungs every 4 (four) hours as needed for wheezing or shortness of breath. 05/21/19   Shelly Coss, MD  amiodarone (PACERONE) 200 MG tablet Take 1 tablet (200 mg total) by mouth daily. 05/24/19   Lelon Perla, MD  amLODipine (NORVASC) 5 MG tablet Take 1 tablet (5 mg total) by mouth daily. 05/27/19   [name], MD  apixaban (ELIQUIS) 2.5 MG TABS tablet Take 1 tablet (2.5 mg total) by mouth 2 (two) times daily. 05/27/19   [name], MD  atorvastatin (LIPITOR) 10 MG tablet Take 1 tablet (10 mg total) by mouth daily. 05/27/19   [name], MD  furosemide (LASIX) 40 MG tablet Take 1 tablet (40 mg total) by mouth 2 (two) times daily. 05/31/19 05/25/20  Deboraha Sprang, MD  losartan-hydrochlorothiazide (HYZAAR) 100-25 MG tablet Take 1 tablet by mouth daily. 05/27/19   [name], MD  Potassium Chloride ER 20 MEQ TBCR Take 20 mEq by mouth daily as needed (as long as taking lasix). 05/27/19   [name], MD    Physical Exam: Vitals:   06/02/19 1830 06/02/19 1845 06/02/19 1915 06/02/19 1930  BP: (!) 140/51 (!) 136/57 (!) 135/53 123/62  Pulse: 75 78 72 81  Resp: 19 19 20  (!) 25  Temp:      TempSrc:      SpO2: 100% 100% 100% 90%  Weight:      Height:          Constitutional: Moderately built and nourished. Vitals:   06/02/19 1830 06/02/19 1845 06/02/19 1915 06/02/19 1930  BP: (!) 140/51 (!) 136/57 (!) 135/53 123/62  Pulse: 75 78 72 81  Resp: 19 19 20  (!) 25  Temp:      TempSrc:      SpO2: 100% 100% 100% 90%  Weight:      Height:       Eyes: Anicteric no pallor. ENMT: No discharge from the ears eyes nose or mouth. Neck: No mass or.  No neck rigidity. Respiratory: No rhonchi or crepitations. Cardiovascular: S1-S2 heard. Abdomen: Soft nontender bowel sounds  present. Musculoskeletal: Bilateral lower extremity edema present. Skin: No rash. Neurologic: Alert awake oriented to time place and person.  Moves all extremities. Psychiatric: Appears normal.  Normal affect.   Labs on Admission: I have personally reviewed following labs and imaging studies  CBC: Recent Labs  Lab 06/02/19 1705  WBC 14.1*  NEUTROABS 11.1*  HGB 8.5*  HCT 27.8*  MCV 86.6  PLT 017*   Basic Metabolic Panel: Recent Labs  Lab 06/02/19 1705  NA 135  K 4.8  CL 101  CO2 24  GLUCOSE 114*  BUN 29*  CREATININE 1.84*  CALCIUM 8.7*   GFR: Estimated Creatinine Clearance: 21.1 mL/min (A) (by C-G formula based on SCr of 1.84 mg/dL (H)). Liver Function Tests: Recent Labs  Lab  06/02/19 1705  AST 33  ALT 18  ALKPHOS 63  BILITOT 1.1  PROT 6.3*  ALBUMIN 2.3*   No results for input(s): LIPASE, AMYLASE in the last 168 hours. No results for input(s): AMMONIA in the last 168 hours. Coagulation Profile: No results for input(s): INR, PROTIME in the last 168 hours. Cardiac Enzymes: Recent Labs  Lab 06/02/19 1705  TROPONINI 0.04*   BNP (last 3 results) No results for input(s): PROBNP in the last 8760 hours. HbA1C: No results for input(s): HGBA1C in the last 72 hours. CBG: No results for input(s): GLUCAP in the last 168 hours. Lipid Profile: No results for input(s): CHOL, HDL, LDLCALC, TRIG, CHOLHDL, LDLDIRECT in the last 72 hours. Thyroid Function Tests: No results for input(s): TSH, T4TOTAL, FREET4, T3FREE, THYROIDAB in the last 72 hours. Anemia Panel: No results for input(s): VITAMINB12, FOLATE, FERRITIN, TIBC, IRON, RETICCTPCT in the last 72 hours. Urine analysis:    Component Value Date/Time   COLORURINE STRAW (A) 05/15/2019 2150   APPEARANCEUR HAZY (A) 05/15/2019 2150   LABSPEC 1.005 05/15/2019 2150   PHURINE 6.0 05/15/2019 2150   GLUCOSEU NEGATIVE 05/15/2019 2150   HGBUR NEGATIVE 05/15/2019 2150   BILIRUBINUR NEGATIVE 05/15/2019 2150    BILIRUBINUR Negative 12/11/2018 0947   KETONESUR NEGATIVE 05/15/2019 2150   PROTEINUR NEGATIVE 05/15/2019 2150   UROBILINOGEN 0.2 12/11/2018 0947   UROBILINOGEN 0.2 12/08/2010 0639   NITRITE NEGATIVE 05/15/2019 2150   LEUKOCYTESUR MODERATE (A) 05/15/2019 2150   Sepsis Labs: @LABRCNTIP (procalcitonin:4,lacticidven:4) ) Recent Results (from the past 240 hour(s))  SARS Coronavirus 2 (CEPHEID- Performed in [facility] hospital lab), Hosp Order     Status: None   Collection Time: 06/02/19  5:05 PM   Specimen: Nasopharyngeal Swab  Result Value Ref Range Status   SARS Coronavirus 2 NEGATIVE NEGATIVE Final    Comment: (NOTE) If result is NEGATIVE SARS-CoV-2 target nucleic acids are NOT DETECTED. The SARS-CoV-2 RNA is generally detectable in upper and lower  respiratory specimens during the acute phase of infection. The lowest  concentration of SARS-CoV-2 viral copies this assay can detect is 250  copies / mL. A negative result does not preclude SARS-CoV-2 infection  and should not be used as the sole basis for treatment or other  patient management decisions.  A negative result may occur with  improper specimen collection / handling, submission of specimen other  than nasopharyngeal swab, presence of viral mutation(s) within the  areas targeted by this assay, and inadequate number of viral copies  (<250 copies / mL). A negative result must be combined with clinical  observations, patient history, and epidemiological information. If result is POSITIVE SARS-CoV-2 target nucleic acids are DETECTED. The SARS-CoV-2 RNA is generally detectable in upper and lower  respiratory specimens dur ing the acute phase of infection.  Positive  results are indicative of active infection with SARS-CoV-2.  Clinical  correlation with patient history and other diagnostic information is  necessary to determine patient infection status.  Positive results do  not rule out bacterial infection or co-infection  with other viruses. If result is PRESUMPTIVE POSTIVE SARS-CoV-2 nucleic acids MAY BE PRESENT.   A presumptive positive result was obtained on the submitted specimen  and confirmed on repeat testing.  While 2019 novel coronavirus  (SARS-CoV-2) nucleic acids may be present in the submitted sample  additional confirmatory testing may be necessary for epidemiological  and / or clinical management purposes  to differentiate between  SARS-CoV-2 and other Sarbecovirus currently known to infect humans.  If clinically indicated additional testing with an alternate test  methodology [phone]) is advised. The SARS-CoV-2 RNA is generally  detectable in upper and lower respiratory sp ecimens during the acute  phase of infection. The expected result is Negative. Fact Sheet for Patients:  StrictlyIdeas.no Fact Sheet for Healthcare Providers: BankingDealers.co.za This test is not yet approved or cleared by the [place] FDA and has been authorized for detection and/or diagnosis of SARS-CoV-2 by FDA under an Emergency Use Authorization (EUA).  This EUA will remain in effect (meaning this test can be used) for the duration of the COVID-19 declaration under Section 564(b)(1) of the Act, 21 U.S.C. section 360bbb-3(b)(1), unless the authorization is terminated or revoked sooner. Performed at [facility], [place] [address] [place], [place] [address]      Radiological Exams on Admission: Dg Chest Portable 1 View  Result Date: 06/02/2019 CLINICAL DATA:  Acute shortness of breath EXAM: PORTABLE CHEST 1 VIEW COMPARISON:  05/15/2019 FINDINGS: An enlarging RIGHT pleural effusion is noted, now moderate to large. Cardiomegaly and interstitial pulmonary edema again noted. Compressive RIGHT LOWER lung atelectasis noted. LEFT pacemaker again noted. No pneumothorax or acute bony abnormality. IMPRESSION: Enlarging RIGHT pleural effusion, now moderate to large.  Interstitial pulmonary edema again noted. Electronically Signed   By: Margarette Canada M.D.   On: 06/02/2019 17:20    EKG: Independently reviewed.  Paced rhythm.  Assessment/Plan Principal Problem:   Acute respiratory failure with hypoxia (HCC) Active Problems:   Essential hypertension   Acute on chronic diastolic heart failure (HCC)   Atrial fibrillation/flutter   Pacemaker   CKD (chronic kidney disease) stage 3, GFR 30-59 ml/min (HCC)    1. Acute respiratory failure with hypoxia at this time most likely from CHF exacerbation with large right pleural effusion.  Patient was given 40 IV Lasix I have ordered 60 mg IV in addition I also place patient on every 12 Lasix.  Consulted pulmonary critical care for possible need for thoracentesis.  We will cycle cardiac markers.  Closely observe. 2. History of atrial fibrillation on amiodarone and apixaban.  In anticipation of possible thoracentesis and holding apixaban and keeping patient on heparin. 3. Complete heart block status post pacemaker placement. 4. Chronic kidney disease stage III creatinine appears to be at baseline.  Note that patient is on Hyzaar.  If creatinine worsen may have to hold. 5. History of COPD presently not wheezing. 6. Anemia likely from renal disease follow CBC appears to be chronic. 7. Hypertension on Hyzaar and amlodipine.  See #4 with regarding holding Hyzaar. 8. Hyperlipidemia on statins.   DVT prophylaxis: Heparin. Code Status: Full code. Family Communication: Discussed with patient's daughter. Disposition Plan: Home. Consults called: Pulmonary critical care. Admission status: Inpatient.   [name] MD [facility] Pager [phone].  If 7PM-7AM, please contact night-coverage [email or web address] Password [facility]  06/02/2019, 11:12 PM

## 2019-06-03 ENCOUNTER — Inpatient Hospital Stay (HOSPITAL_COMMUNITY): Payer: Medicare Other

## 2019-06-03 DIAGNOSIS — I4819 Other persistent atrial fibrillation: Secondary | ICD-10-CM

## 2019-06-03 DIAGNOSIS — J9621 Acute and chronic respiratory failure with hypoxia: Secondary | ICD-10-CM

## 2019-06-03 DIAGNOSIS — J9601 Acute respiratory failure with hypoxia: Secondary | ICD-10-CM

## 2019-06-03 DIAGNOSIS — J90 Pleural effusion, not elsewhere classified: Secondary | ICD-10-CM

## 2019-06-03 LAB — CBC WITH DIFFERENTIAL/PLATELET
Abs Immature Granulocytes: 0.05 10*3/uL (ref 0.00–0.07)
Basophils Absolute: 0.1 10*3/uL (ref 0.0–0.1)
Basophils Relative: 1 %
Eosinophils Absolute: 0.3 10*3/uL (ref 0.0–0.5)
Eosinophils Relative: 2 %
HCT: 30.3 % — ABNORMAL LOW (ref 39.0–52.0)
Hemoglobin: 9.3 g/dL — ABNORMAL LOW (ref 13.0–17.0)
Immature Granulocytes: 0 %
Lymphocytes Relative: 18 %
Lymphs Abs: 2.4 10*3/uL (ref 0.7–4.0)
MCH: 26 pg (ref 26.0–34.0)
MCHC: 30.7 g/dL (ref 30.0–36.0)
MCV: 84.6 fL (ref 80.0–100.0)
Monocytes Absolute: 1 10*3/uL (ref 0.1–1.0)
Monocytes Relative: 7 %
Neutro Abs: 9.4 10*3/uL — ABNORMAL HIGH (ref 1.7–7.7)
Neutrophils Relative %: 72 %
Platelets: 436 10*3/uL — ABNORMAL HIGH (ref 150–400)
RBC: 3.58 MIL/uL — ABNORMAL LOW (ref 4.22–5.81)
RDW: 16.5 % — ABNORMAL HIGH (ref 11.5–15.5)
WBC: 13.2 10*3/uL — ABNORMAL HIGH (ref 4.0–10.5)
nRBC: 0 % (ref 0.0–0.2)

## 2019-06-03 LAB — BODY FLUID CELL COUNT WITH DIFFERENTIAL
Lymphs, Fluid: 22 %
Monocyte-Macrophage-Serous Fluid: 12 % — ABNORMAL LOW (ref 50–90)
Neutrophil Count, Fluid: 66 % — ABNORMAL HIGH (ref 0–25)
Total Nucleated Cell Count, Fluid: 810 cu mm (ref 0–1000)

## 2019-06-03 LAB — APTT
aPTT: 51 seconds — ABNORMAL HIGH (ref 24–36)
aPTT: 58 seconds — ABNORMAL HIGH (ref 24–36)

## 2019-06-03 LAB — PROTEIN, PLEURAL OR PERITONEAL FLUID: Total protein, fluid: 3.9 g/dL

## 2019-06-03 LAB — COMPREHENSIVE METABOLIC PANEL
ALT: 14 U/L (ref 0–44)
AST: 11 U/L — ABNORMAL LOW (ref 15–41)
Albumin: 2.2 g/dL — ABNORMAL LOW (ref 3.5–5.0)
Alkaline Phosphatase: 62 U/L (ref 38–126)
Anion gap: 10 (ref 5–15)
BUN: 28 mg/dL — ABNORMAL HIGH (ref 8–23)
CO2: 25 mmol/L (ref 22–32)
Calcium: 8.6 mg/dL — ABNORMAL LOW (ref 8.9–10.3)
Chloride: 102 mmol/L (ref 98–111)
Creatinine, Ser: 1.7 mg/dL — ABNORMAL HIGH (ref 0.61–1.24)
GFR calc Af Amer: 39 mL/min — ABNORMAL LOW (ref >60)
GFR calc non Af Amer: 34 mL/min — ABNORMAL LOW (ref >60)
Glucose, Bld: 118 mg/dL — ABNORMAL HIGH (ref 70–99)
Potassium: 3.7 mmol/L (ref 3.5–5.1)
Sodium: 137 mmol/L (ref 135–145)
Total Bilirubin: 0.3 mg/dL (ref 0.3–1.2)
Total Protein: 6.2 g/dL — ABNORMAL LOW (ref 6.5–8.1)

## 2019-06-03 LAB — CBC
HCT: 29.3 % — ABNORMAL LOW (ref 39.0–52.0)
Hemoglobin: 9 g/dL — ABNORMAL LOW (ref 13.0–17.0)
MCH: 26.5 pg (ref 26.0–34.0)
MCHC: 30.7 g/dL (ref 30.0–36.0)
MCV: 86.4 fL (ref 80.0–100.0)
Platelets: 411 10*3/uL — ABNORMAL HIGH (ref 150–400)
RBC: 3.39 MIL/uL — ABNORMAL LOW (ref 4.22–5.81)
RDW: 16.7 % — ABNORMAL HIGH (ref 11.5–15.5)
WBC: 12.6 10*3/uL — ABNORMAL HIGH (ref 4.0–10.5)
nRBC: 0 % (ref 0.0–0.2)

## 2019-06-03 LAB — TROPONIN I
Troponin I: 0.04 ng/mL (ref <0.03)
Troponin I: 0.04 ng/mL (ref <0.03)
Troponin I: 0.04 ng/mL (ref <0.03)

## 2019-06-03 LAB — LACTATE DEHYDROGENASE: LDH: 168 U/L (ref 98–192)

## 2019-06-03 LAB — MAGNESIUM: Magnesium: 2 mg/dL (ref 1.7–2.4)

## 2019-06-03 LAB — GLUCOSE, PLEURAL OR PERITONEAL FLUID: Glucose, Fluid: 103 mg/dL

## 2019-06-03 LAB — ALBUMIN, PLEURAL OR PERITONEAL FLUID: Albumin, Fluid: 1.7 g/dL

## 2019-06-03 LAB — LACTATE DEHYDROGENASE, PLEURAL OR PERITONEAL FLUID: LD, Fluid: 224 U/L — ABNORMAL HIGH (ref 3–23)

## 2019-06-03 LAB — MRSA PCR SCREENING: MRSA by PCR: NEGATIVE

## 2019-06-03 LAB — HEPARIN LEVEL (UNFRACTIONATED): Heparin Unfractionated: 1.88 IU/mL — ABNORMAL HIGH (ref 0.30–0.70)

## 2019-06-03 MED ORDER — MELATONIN 3 MG PO TABS
3.0000 mg | ORAL_TABLET | Freq: Every evening | ORAL | Status: DC | PRN
  Administered 2019-06-04 (×2): 3 mg via ORAL
  Filled 2019-06-03 (×3): qty 1

## 2019-06-03 NOTE — Progress Notes (Signed)
[place] for Heparin (Apixaban on hold) Indication: atrial fibrillation  No Known Allergies  Patient Measurements: Height: 5' 5.75" (167 cm) Weight: 131 lb (59.4 kg) IBW/kg (Calculated) : 63.23  Vital Signs: BP: 127/47 (06/22 1400) Pulse Rate: 67 (06/22 1505)  Labs: Recent Labs    06/02/19 1705 06/02/19 2327 06/03/19 0237 06/03/19 0707 06/03/19 0836 06/03/19 1500 06/03/19 1823  HGB 8.5*  --  9.3* 9.0*  --   --   --   HCT 27.8*  --  30.3* 29.3*  --   --   --   PLT 497*  --  436* 411*  --   --   --   APTT  --   --   --   --  51*  --  58*  HEPARINUNFRC  --   --   --   --  1.88*  --   --   CREATININE 1.84*  --  1.70*  --   --   --   --   TROPONINI 0.04* 0.04* 0.04*  --   --  0.04*  --     Estimated Creatinine Clearance: 22.8 mL/min (A) (by C-G formula based on SCr of 1.7 mg/dL (H)).  Assessment: 85 y/o M presents to the ED with 2 day history of worsening shortness of breath on and holding apixaban during acute illness. Heparin level is elevated as expected due to influence of apixaban.  -aPTT up to 58 after increase to 900 units/hr  Goal of Therapy:  Heparin level 0.3-0.7 units/ml aPTT 66-102 seconds Monitor platelets by anticoagulation protocol: Yes   Plan:  Increase heparin drip to 1000 units/hr  Daily aPTT, heparin level and CBC  Hildred Laser, PharmD Clinical Pharmacist **Pharmacist phone directory can now be found on [email or web address] ([email or web address]).  Listed under [place].

## 2019-06-03 NOTE — Progress Notes (Signed)
[place] for Heparin (Apixaban on hold) Indication: atrial fibrillation  No Known Allergies  Patient Measurements: Height: 5' 5.75" (167 cm) Weight: 131 lb (59.4 kg) IBW/kg (Calculated) : 63.23  Vital Signs: BP: 117/49 (06/22 0600) Pulse Rate: 79 (06/22 0700)  Labs: Recent Labs    06/02/19 1705 06/02/19 2327 06/03/19 0237 06/03/19 0707 06/03/19 0836  HGB 8.5*  --  9.3* 9.0*  --   HCT 27.8*  --  30.3* 29.3*  --   PLT 497*  --  436* 411*  --   APTT  --   --   --   --  51*  HEPARINUNFRC  --   --   --   --  1.88*  CREATININE 1.84*  --  1.70*  --   --   TROPONINI 0.04* 0.04* 0.04*  --   --     Estimated Creatinine Clearance: 22.8 mL/min (A) (by C-G formula based on SCr of 1.7 mg/dL (H)).  Assessment: 85 y/o M presents to the ED with 2 day history of worsening shortness of breath, starting heparin and holding apixaban during acute illness. Initial heparin level is elevated as expected due to influence of apixaban. APTT is below goal though. Pt had a thoracentesis this AM but it is unclear if heparin was turned off. CBC is stable and no bleeding noted.  Goal of Therapy:  Heparin level 0.3-0.7 units/ml aPTT 66-102 seconds Monitor platelets by anticoagulation protocol: Yes   Plan:  Increase heparin drip conservatively to 900 units/hr since it is unknown if heparin was off during the procedure this morning Check an 8 hr aPTT Daily aPTT, heparin level and CBC  Salome Arnt, PharmD, BCPS Please see AMION for all pharmacy numbers 06/03/2019 9:45 AM

## 2019-06-03 NOTE — ED Notes (Signed)
Lunch Tray Ordered @ 1010-per RN called by Levada Dy

## 2019-06-03 NOTE — ED Notes (Signed)
Pt still complaining of "feeling like they don't have enough oxygen." RN notified MD. MD aware.

## 2019-06-03 NOTE — ED Notes (Signed)
RN personally gave pleural fluid to lab tech.

## 2019-06-03 NOTE — Progress Notes (Signed)
PROGRESS NOTE    Andre [name]  FOY:[id number] DOB: Jul 13, 1923 DOA: 06/02/2019 PCP: [name], MD  Outpatient Specialists:   Brief Narrative:  As per H&P "Andre [name] is a 85 y.o. male with history of diastolic CHF last EF measured in March 2020 was 65%, chronic respiratory failure on home oxygen about 5 L, A. fib on amiodarone and apixaban, complete heart block status post pacemaker placement, chronic anemia presents to the ER because of worsening shortness of breath over the last couple of days.  Patient was recently admitted twice for CHF exacerbation.  During which patient also was intubated.  Patient states he was doing fine until last few days he became more short of breath denies any chest pain productive cough fever or chills.  He states he has been compliant with his Lasix and other medications.  Chart review shows that he had recently followed with cardiologist Dr. Caryl Comes.  ED Course: In the ER patient was hypoxic requiring 15 L oxygen and chest x-ray shows congestion with enlarging right-sided pleural effusion which at this time looks large.  Pulmonary critical care was consulted.  Patient was given Lasix 40 mg IV despite which patient was still short of breath I ordered another 60.  Patient admitted for acute respiratory failure with hypoxia likely from CHF exacerbation with right pleural effusion.  Other labs include creatinine of 1.8 hemoglobin 8.5 WBC 14.1 platelets 497 BNP 363 troponin 0.04 and EKG shows paced rhythm".  06/03/2019: Patient has undergone right-sided thoracentesis.  1.7 L of bloody fluid was removed.  CT scan of the chest without contrast ordered.  Pulmonary input is appreciated.  Concerns for possible malignancy.  Patient feels a lot better after the thoracentesis.  Assessment & Plan:   Principal Problem:   Acute respiratory failure with hypoxia (HCC) Active Problems:   Essential hypertension   Acute on chronic diastolic heart failure (HCC)  Atrial fibrillation/flutter   Pacemaker   CKD (chronic kidney disease) stage 3, GFR 30-59 ml/min (HCC)   Pleural effusion on right  Acute respiratory failure with hypoxia: Likely multifactorial.  Patient has significant right-sided pleural effusion.  Patient also have CHF exacerbation.  However, patient has improved significantly after thoracentesis.  Follow pleural fluid analysis Continue Lasix Further management will depend on hospital course.   History of atrial fibrillation: Patient is on amiodarone and apixaban.  Apixaban was held prior to thoracentesis. Optimize heart rate.  History of complete heart block: Patient is status post pacemaker placement.  Chronic kidney disease stage III: Serum creatinine appears to be at baseline.    History of COPD: Stable.   Anemia: Likely multifactorial.   Suspect anemia of chronic inflammation.    Hypertension: Continue to monitor and optimize.   Hyperlipidemia: On statins.   DVT prophylaxis: Heparin.  Restart apixaban 2.5 mg p.o. twice daily when okay with the pulmonary team. Code Status: Full code. Family Communication:  Disposition Plan: Home. Consults called: Pulmonary critical care.   Procedures:   Thoracentesis  Antimicrobials:   None   Subjective: Shortness of breath has resolved. Patient feels a lot better. Patient tells me "now, I can breath"  Objective: Vitals:   06/03/19 1400 06/03/19 1500 06/03/19 1505 06/03/19 1523  BP: (!) 127/47     Pulse:  65 67   Resp: 18 (!) 21  20  Temp:      TempSrc:      SpO2:  92% 94%   Weight:      Height:  Intake/Output Summary (Last 24 hours) at 06/03/2019 1704 Last data filed at 06/03/2019 1519 Gross per 24 hour  Intake -  Output 1900 ml  Net -1900 ml   Filed Weights   06/02/19 1552  Weight: 59.4 kg    Examination:  General exam: Appears calm and comfortable  Respiratory system: Decreased air entry right lung field posteriorly  Cardiovascular system: S1 & S2 heard Gastrointestinal system: Abdomen is nondistended, soft and nontender. No organomegaly or masses felt. Normal bowel sounds heard. Central nervous system: Alert and oriented.  Extremities. Extremities: 1-1+ right leg edema.  Data Reviewed: I have personally reviewed following labs and imaging studies  CBC: Recent Labs  Lab 06/02/19 1705 06/03/19 0237 06/03/19 0707  WBC 14.1* 13.2* 12.6*  NEUTROABS 11.1* 9.4*  --   HGB 8.5* 9.3* 9.0*  HCT 27.8* 30.3* 29.3*  MCV 86.6 84.6 86.4  PLT 497* 436* 947*   Basic Metabolic Panel: Recent Labs  Lab 06/02/19 1705 06/02/19 2327 06/03/19 0237  NA 135  --  137  K 4.8  --  3.7  CL 101  --  102  CO2 24  --  25  GLUCOSE 114*  --  118*  BUN 29*  --  28*  CREATININE 1.84*  --  1.70*  CALCIUM 8.7*  --  8.6*  MG  --  2.0  --    GFR: Estimated Creatinine Clearance: 22.8 mL/min (A) (by C-G formula based on SCr of 1.7 mg/dL (H)). Liver Function Tests: Recent Labs  Lab 06/02/19 1705 06/03/19 0237  AST 33 11*  ALT 18 14  ALKPHOS 63 62  BILITOT 1.1 0.3  PROT 6.3* 6.2*  ALBUMIN 2.3* 2.2*   No results for input(s): LIPASE, AMYLASE in the last 168 hours. No results for input(s): AMMONIA in the last 168 hours. Coagulation Profile: No results for input(s): INR, PROTIME in the last 168 hours. Cardiac Enzymes: Recent Labs  Lab 06/02/19 1705 06/02/19 2327 06/03/19 0237 06/03/19 1500  TROPONINI 0.04* 0.04* 0.04* 0.04*   BNP (last 3 results) No results for input(s): PROBNP in the last 8760 hours. HbA1C: No results for input(s): HGBA1C in the last 72 hours. CBG: No results for input(s): GLUCAP in the last 168 hours. Lipid Profile: No results for input(s): CHOL, HDL, LDLCALC, TRIG, CHOLHDL, LDLDIRECT in the last 72 hours. Thyroid Function Tests: No results for input(s): TSH, T4TOTAL, FREET4, T3FREE, THYROIDAB in the last 72 hours. Anemia Panel: No results for input(s): VITAMINB12, FOLATE, FERRITIN,  TIBC, IRON, RETICCTPCT in the last 72 hours. Urine analysis:    Component Value Date/Time   COLORURINE STRAW (A) 05/15/2019 2150   APPEARANCEUR HAZY (A) 05/15/2019 2150   LABSPEC 1.005 05/15/2019 2150   PHURINE 6.0 05/15/2019 2150   GLUCOSEU NEGATIVE 05/15/2019 2150   HGBUR NEGATIVE 05/15/2019 2150   BILIRUBINUR NEGATIVE 05/15/2019 2150   BILIRUBINUR Negative 12/11/2018 0947   KETONESUR NEGATIVE 05/15/2019 2150   PROTEINUR NEGATIVE 05/15/2019 2150   UROBILINOGEN 0.2 12/11/2018 0947   UROBILINOGEN 0.2 12/08/2010 0639   NITRITE NEGATIVE 05/15/2019 2150   LEUKOCYTESUR MODERATE (A) 05/15/2019 2150   Sepsis Labs: @LABRCNTIP (procalcitonin:4,lacticidven:4)  ) Recent Results (from the past 240 hour(s))  SARS Coronavirus 2 (CEPHEID- Performed in [place] hospital lab), Hosp Order     Status: None   Collection Time: 06/02/19  5:05 PM   Specimen: Nasopharyngeal Swab  Result Value Ref Range Status   SARS Coronavirus 2 NEGATIVE NEGATIVE Final    Comment: (NOTE) If result is NEGATIVE SARS-CoV-2  target nucleic acids are NOT DETECTED. The SARS-CoV-2 RNA is generally detectable in upper and lower  respiratory specimens during the acute phase of infection. The lowest  concentration of SARS-CoV-2 viral copies this assay can detect is 250  copies / mL. A negative result does not preclude SARS-CoV-2 infection  and should not be used as the sole basis for treatment or other  patient management decisions.  A negative result may occur with  improper specimen collection / handling, submission of specimen other  than nasopharyngeal swab, presence of viral mutation(s) within the  areas targeted by this assay, and inadequate number of viral copies  (<250 copies / mL). A negative result must be combined with clinical  observations, patient history, and epidemiological information. If result is POSITIVE SARS-CoV-2 target nucleic acids are DETECTED. The SARS-CoV-2 RNA is generally detectable in  upper and lower  respiratory specimens dur ing the acute phase of infection.  Positive  results are indicative of active infection with SARS-CoV-2.  Clinical  correlation with patient history and other diagnostic information is  necessary to determine patient infection status.  Positive results do  not rule out bacterial infection or co-infection with other viruses. If result is PRESUMPTIVE POSTIVE SARS-CoV-2 nucleic acids MAY BE PRESENT.   A presumptive positive result was obtained on the submitted specimen  and confirmed on repeat testing.  While 2019 novel coronavirus  (SARS-CoV-2) nucleic acids may be present in the submitted sample  additional confirmatory testing may be necessary for epidemiological  and / or clinical management purposes  to differentiate between  SARS-CoV-2 and other Sarbecovirus currently known to infect humans.  If clinically indicated additional testing with an alternate test  methodology [phone]) is advised. The SARS-CoV-2 RNA is generally  detectable in upper and lower respiratory sp ecimens during the acute  phase of infection. The expected result is Negative. Fact Sheet for Patients:  StrictlyIdeas.no Fact Sheet for Healthcare Providers: BankingDealers.co.za This test is not yet approved or cleared by the [place] FDA and has been authorized for detection and/or diagnosis of SARS-CoV-2 by FDA under an Emergency Use Authorization (EUA).  This EUA will remain in effect (meaning this test can be used) for the duration of the COVID-19 declaration under Section 564(b)(1) of the Act, 21 U.S.C. section 360bbb-3(b)(1), unless the authorization is terminated or revoked sooner. Performed at [facility], [place] [address] [place], [place] [address]   Body fluid culture (includes gram stain)     Status: None (Preliminary result)   Collection Time: 06/03/19  6:03 AM   Specimen: Pleural Fluid  Result Value  Ref Range Status   Specimen Description PLEURAL RIGHT  Final   Special Requests NONE  Final   Gram Stain   Final    MODERATE WBC PRESENT,BOTH PMN AND MONONUCLEAR NO ORGANISMS SEEN Performed at [facility], [place] [address], [place], [place] [address]    Culture PENDING  Incomplete   Report Status PENDING  Incomplete         Radiology Studies: Dg Chest Port 1 View  Result Date: 06/03/2019 CLINICAL DATA:  Pleural effusion on the right EXAM: PORTABLE CHEST 1 VIEW COMPARISON:  Yesterday FINDINGS: Diminished right pleural effusion with residual potentially loculated laterally. Diffuse interstitial and airspace opacity. No pneumothorax. Normal heart size. Dual-chamber pacer leads from the right. IMPRESSION: No complicating feature after right thoracentesis. Pleural fluid has decreased to small volume. Electronically Signed   By: Monte Fantasia M.D.   On: 06/03/2019 07:00  Dg Chest Portable 1 View  Result Date: 06/02/2019 CLINICAL DATA:  Acute shortness of breath EXAM: PORTABLE CHEST 1 VIEW COMPARISON:  05/15/2019 FINDINGS: An enlarging RIGHT pleural effusion is noted, now moderate to large. Cardiomegaly and interstitial pulmonary edema again noted. Compressive RIGHT LOWER lung atelectasis noted. LEFT pacemaker again noted. No pneumothorax or acute bony abnormality. IMPRESSION: Enlarging RIGHT pleural effusion, now moderate to large. Interstitial pulmonary edema again noted. Electronically Signed   By: Margarette Canada M.D.   On: 06/02/2019 17:20        Scheduled Meds: . amiodarone  200 mg Oral Daily  . amLODipine  5 mg Oral Daily  . atorvastatin  10 mg Oral Daily  . furosemide  60 mg Intravenous BID  . hydrochlorothiazide  25 mg Oral Daily   And  . losartan  100 mg Oral Daily  . potassium chloride SA  20 mEq Oral Daily   Continuous Infusions: . heparin 900 Units/hr (06/03/19 0944)     LOS: 1 day    Time spent: 35 minutes    Dana Allan, MD  [facility]  Pager #: [phone] 7PM-7AM contact night coverage as above

## 2019-06-03 NOTE — ED Notes (Signed)
Dr. Lara Mulch notified RN that pt did not need ABG if doing well on bipap

## 2019-06-03 NOTE — ED Notes (Signed)
ED TO INPATIENT HANDOFF REPORT  ED Nurse Name and Phone #: [phone]  S Name/Age/Gender Andre [name] 85 y.o. male Room/Bed: 022C/022C  Code Status   Code Status: Full Code  Home/SNF/Other Home Patient oriented to: self, place, time and situation Is this baseline? Yes   Triage Complete: Triage complete  Chief Complaint SOB  Triage Note Pt BIB [facility] for shortness of breath that started on Friday. Per EMS patient normally wears 3-5L of oxygen at home all the time. EMS reports patient was 80% on 5L upon their arrival. Pt denying any fevers, pain, nausea or vomiting. Pt 78% on 5L upon ED arrival. Pt 100% on non-rebreather at present time.    Allergies No Known Allergies  Level of Care/Admitting Diagnosis ED Disposition    ED Disposition Condition Comment   Admit  Hospital Area: [place] [[id number]]  Level of Care: Progressive [102]  Covid Evaluation: N/A  Diagnosis: Acute respiratory failure with hypoxia [facility]) [[id number]]  Admitting Physician: Andre [name] [phone]  Attending Physician: Andre [name] [phone]  Estimated length of stay: past midnight tomorrow  Certification:: I certify this patient will need inpatient services for at least 2 midnights  PT Class (Do Not Modify): Inpatient [101]  PT Acc Code (Do Not Modify): Private [1]       B Medical/Surgery History Past Medical History:  Diagnosis Date  . Acute on chronic respiratory failure with hypoxia ([place])   . Adenomatous colon polyp 04/1985   no further colonoscopy, 2008-last colonoscopy, no polyps    . Atrial fibrillation ([place])   . Chronic atrial fibrillation   . Chronic diastolic heart failure ([place])   . Diverticulosis   . History of skin cancer    dermatology every 6 months Dr. [name]  . Hyperlipidemia   . Hypertension   . Lobar pneumonia, unspecified organism ([place])   . Macular degeneration    bilateral  . Nephrolithiasis   . PAF (paroxysmal atrial fibrillation) ([place])    . PAT (paroxysmal atrial tachycardia) ([facility])    many years ago, worse with smoking  . Severe sepsis [facility])    Past Surgical History:  Procedure Laterality Date  . CARDIOVERSION N/A 05/23/2016   Procedure: CARDIOVERSION;  Surgeon: [name], MD;  Location: [facility] ENDOSCOPY;  Service: Cardiovascular;  Laterality: N/A;  . CARDIOVERSION N/A 03/01/2019   Procedure: CARDIOVERSION;  Surgeon: Lelon Perla, MD;  Location: [facility] ENDOSCOPY;  Service: Cardiovascular;  Laterality: N/A;  . CATARACT EXTRACTION     bilateral  . INGUINAL HERNIA REPAIR    . PACEMAKER IMPLANT N/A 02/24/2019   Procedure: PACEMAKER IMPLANT;  Surgeon: Deboraha Sprang, MD;  Location: [place] CV LAB;  Service: Cardiovascular;  Laterality: N/A;  . TEE WITHOUT CARDIOVERSION N/A 03/22/2016   Procedure: TRANSESOPHAGEAL ECHOCARDIOGRAM (TEE);  Surgeon: [name], MD;  Location: [place];  Service: Cardiovascular;  Laterality: N/A;  . TEE WITHOUT CARDIOVERSION N/A 05/23/2016   Procedure: TRANSESOPHAGEAL ECHOCARDIOGRAM (TEE);  Surgeon: [name], MD;  Location: [facility] ENDOSCOPY;  Service: Cardiovascular;  Laterality: N/A;  . TEE WITHOUT CARDIOVERSION N/A 03/01/2019   Procedure: TRANSESOPHAGEAL ECHOCARDIOGRAM (TEE);  Surgeon: Lelon Perla, MD;  Location: [facility] ENDOSCOPY;  Service: Cardiovascular;  Laterality: N/A;  . TENDON REPAIR  12/11   right leg     A IV Location/Drains/Wounds Patient Lines/Drains/Airways Status   Active Line/Drains/Airways    Name:   Placement date:   Placement time:   Site:   Days:   Peripheral IV 06/02/19  Left Antecubital   06/02/19    1554    Antecubital   1   Peripheral IV 06/02/19 Right Forearm   06/02/19    1900    Forearm   1   External Urinary Catheter   05/15/19    2345    -   19   External Urinary Catheter   06/02/19    1722    -   1   Incision (Closed) 02/24/19 Chest Left;Anterior   02/24/19    1030     99          Intake/Output Last 24 hours  Intake/Output Summary (Last 24  hours) at 06/03/2019 1452 Last data filed at 06/03/2019 0300 Gross per 24 hour  Intake -  Output 1100 ml  Net -1100 ml    Labs/Imaging Results for orders placed or performed during the hospital encounter of 06/02/19 (from the past 48 hour(s))  CBC with Differential     Status: Abnormal   Collection Time: 06/02/19  5:05 PM  Result Value Ref Range   WBC 14.1 (H) 4.0 - 10.5 K/uL   RBC 3.21 (L) 4.22 - 5.81 MIL/uL   Hemoglobin 8.5 (L) 13.0 - 17.0 g/dL   HCT 27.8 (L) 39.0 - 52.0 %   MCV 86.6 80.0 - 100.0 fL   MCH 26.5 26.0 - 34.0 pg   MCHC 30.6 30.0 - 36.0 g/dL   RDW 16.8 (H) 11.5 - 15.5 %   Platelets 497 (H) 150 - 400 K/uL   nRBC 0.0 0.0 - 0.2 %   Neutrophils Relative % 79 %   Neutro Abs 11.1 (H) 1.7 - 7.7 K/uL   Lymphocytes Relative 12 %   Lymphs Abs 1.7 0.7 - 4.0 K/uL   Monocytes Relative 7 %   Monocytes Absolute 0.9 0.1 - 1.0 K/uL   Eosinophils Relative 1 %   Eosinophils Absolute 0.2 0.0 - 0.5 K/uL   Basophils Relative 0 %   Basophils Absolute 0.1 0.0 - 0.1 K/uL   Immature Granulocytes 1 %   Abs Immature Granulocytes 0.08 (H) 0.00 - 0.07 K/uL    Comment: Performed at [facility], [address] [place], [place] [address]  Comprehensive metabolic panel     Status: Abnormal   Collection Time: 06/02/19  5:05 PM  Result Value Ref Range   Sodium 135 135 - 145 mmol/L   Potassium 4.8 3.5 - 5.1 mmol/L   Chloride 101 98 - 111 mmol/L   CO2 24 22 - 32 mmol/L   Glucose, Bld 114 (H) 70 - 99 mg/dL   BUN 29 (H) 8 - 23 mg/dL   Creatinine, Ser 1.84 (H) 0.61 - 1.24 mg/dL   Calcium 8.7 (L) 8.9 - 10.3 mg/dL   Total Protein 6.3 (L) 6.5 - 8.1 g/dL   Albumin 2.3 (L) 3.5 - 5.0 g/dL   AST 33 15 - 41 U/L   ALT 18 0 - 44 U/L   Alkaline Phosphatase 63 38 - 126 U/L   Total Bilirubin 1.1 0.3 - 1.2 mg/dL   GFR calc non Af Amer 31 (L) >60 mL/min   GFR calc Af Amer 36 (L) >60 mL/min   Anion gap 10 5 - 15    Comment: Performed at [facility], [place] [address], [place], [place]  [address]  Brain natriuretic peptide     Status: Abnormal   Collection Time: 06/02/19  5:05 PM  Result Value Ref Range   B Natriuretic Peptide  363.6 (H) 0.0 - 100.0 pg/mL    Comment: Performed at [facility], [place] [address], [place], [place] [address]  Troponin I - ONCE - STAT     Status: Abnormal   Collection Time: 06/02/19  5:05 PM  Result Value Ref Range   Troponin I 0.04 (HH) <0.03 ng/mL    Comment: CRITICAL RESULT CALLED TO, READ BACK BY AND VERIFIED WITH: Tiana Loft AT 1815 06/02/2019 BY L BENFIELD Performed at [facility], [place] [address], [place], [place] [address]   SARS Coronavirus 2 (CEPHEID- Performed in [place] hospital lab), Hosp Order     Status: None   Collection Time: 06/02/19  5:05 PM   Specimen: Nasopharyngeal Swab  Result Value Ref Range   SARS Coronavirus 2 NEGATIVE NEGATIVE    Comment: (NOTE) If result is NEGATIVE SARS-CoV-2 target nucleic acids are NOT DETECTED. The SARS-CoV-2 RNA is generally detectable in upper and lower  respiratory specimens during the acute phase of infection. The lowest  concentration of SARS-CoV-2 viral copies this assay can detect is 250  copies / mL. A negative result does not preclude SARS-CoV-2 infection  and should not be used as the sole basis for treatment or other  patient management decisions.  A negative result may occur with  improper specimen collection / handling, submission of specimen other  than nasopharyngeal swab, presence of viral mutation(s) within the  areas targeted by this assay, and inadequate number of viral copies  (<250 copies / mL). A negative result must be combined with clinical  observations, patient history, and epidemiological information. If result is POSITIVE SARS-CoV-2 target nucleic acids are DETECTED. The SARS-CoV-2 RNA is generally detectable in upper and lower  respiratory specimens dur ing the acute phase of infection.  Positive  results are indicative of active infection with  SARS-CoV-2.  Clinical  correlation with patient history and other diagnostic information is  necessary to determine patient infection status.  Positive results do  not rule out bacterial infection or co-infection with other viruses. If result is PRESUMPTIVE POSTIVE SARS-CoV-2 nucleic acids MAY BE PRESENT.   A presumptive positive result was obtained on the submitted specimen  and confirmed on repeat testing.  While 2019 novel coronavirus  (SARS-CoV-2) nucleic acids may be present in the submitted sample  additional confirmatory testing may be necessary for epidemiological  and / or clinical management purposes  to differentiate between  SARS-CoV-2 and other Sarbecovirus currently known to infect humans.  If clinically indicated additional testing with an alternate test  methodology [phone]) is advised. The SARS-CoV-2 RNA is generally  detectable in upper and lower respiratory sp ecimens during the acute  phase of infection. The expected result is Negative. Fact Sheet for Patients:  StrictlyIdeas.no Fact Sheet for Healthcare Providers: BankingDealers.co.za This test is not yet approved or cleared by the [place] FDA and has been authorized for detection and/or diagnosis of SARS-CoV-2 by FDA under an Emergency Use Authorization (EUA).  This EUA will remain in effect (meaning this test can be used) for the duration of the COVID-19 declaration under Section 564(b)(1) of the Act, 21 U.S.C. section 360bbb-3(b)(1), unless the authorization is terminated or revoked sooner. Performed at [facility], [place] [address], [place], [place] [address]   Lactic acid, plasma     Status: None   Collection Time: 06/02/19  5:05 PM  Result Value Ref Range   Lactic Acid, Venous 1.9 0.5 - 1.9 mmol/L    Comment: Performed  at [facility], [place] [address] [place], [place] [address]  Lactic acid, plasma     Status: None   Collection Time:  06/02/19  6:58 PM  Result Value Ref Range   Lactic Acid, Venous 0.8 0.5 - 1.9 mmol/L    Comment: Performed at [place] [address], [place], [place] [address]  Magnesium     Status: None   Collection Time: 06/02/19 11:27 PM  Result Value Ref Range   Magnesium 2.0 1.7 - 2.4 mg/dL    Comment: Performed at [facility], [place] [address], [place], [place] [address]  Troponin I - Now Then Q6H     Status: Abnormal   Collection Time: 06/02/19 11:27 PM  Result Value Ref Range   Troponin I 0.04 (HH) <0.03 ng/mL    Comment: CRITICAL VALUE NOTED.  VALUE IS CONSISTENT WITH PREVIOUSLY REPORTED AND CALLED VALUE. Performed at [facility], [place] [address], [place], [place] [address]   Comprehensive metabolic panel     Status: Abnormal   Collection Time: 06/03/19  2:37 AM  Result Value Ref Range   Sodium 137 135 - 145 mmol/L   Potassium 3.7 3.5 - 5.1 mmol/L   Chloride 102 98 - 111 mmol/L   CO2 25 22 - 32 mmol/L   Glucose, Bld 118 (H) 70 - 99 mg/dL   BUN 28 (H) 8 - 23 mg/dL   Creatinine, Ser 1.70 (H) 0.61 - 1.24 mg/dL   Calcium 8.6 (L) 8.9 - 10.3 mg/dL   Total Protein 6.2 (L) 6.5 - 8.1 g/dL   Albumin 2.2 (L) 3.5 - 5.0 g/dL   AST 11 (L) 15 - 41 U/L   ALT 14 0 - 44 U/L   Alkaline Phosphatase 62 38 - 126 U/L   Total Bilirubin 0.3 0.3 - 1.2 mg/dL   GFR calc non Af Amer 34 (L) >60 mL/min   GFR calc Af Amer 39 (L) >60 mL/min   Anion gap 10 5 - 15    Comment: Performed at [facility], [place] [address], [place], [place] [address]  CBC WITH DIFFERENTIAL     Status: Abnormal   Collection Time: 06/03/19  2:37 AM  Result Value Ref Range   WBC 13.2 (H) 4.0 - 10.5 K/uL   RBC 3.58 (L) 4.22 - 5.81 MIL/uL   Hemoglobin 9.3 (L) 13.0 - 17.0 g/dL   HCT 30.3 (L) 39.0 - 52.0 %   MCV 84.6 80.0 - 100.0 fL   MCH 26.0 26.0 - 34.0 pg   MCHC 30.7 30.0 - 36.0 g/dL   RDW 16.5 (H) 11.5 - 15.5 %   Platelets 436 (H) 150 - 400 K/uL   nRBC 0.0 0.0 - 0.2 %   Neutrophils Relative % 72 %    Neutro Abs 9.4 (H) 1.7 - 7.7 K/uL   Lymphocytes Relative 18 %   Lymphs Abs 2.4 0.7 - 4.0 K/uL   Monocytes Relative 7 %   Monocytes Absolute 1.0 0.1 - 1.0 K/uL   Eosinophils Relative 2 %   Eosinophils Absolute 0.3 0.0 - 0.5 K/uL   Basophils Relative 1 %   Basophils Absolute 0.1 0.0 - 0.1 K/uL   Immature Granulocytes 0 %   Abs Immature Granulocytes 0.05 0.00 - 0.07 K/uL    Comment: Performed at [facility], [address], [place], [place] [address]  Troponin I - Now Then Q6H     Status: Abnormal  Collection Time: 06/03/19  2:37 AM  Result Value Ref Range   Troponin I 0.04 (HH) <0.03 ng/mL    Comment: CRITICAL VALUE NOTED.  VALUE IS CONSISTENT WITH PREVIOUSLY REPORTED AND CALLED VALUE. Performed at [facility], [place] [address] [place], [place] [address]   Body fluid culture (includes gram stain)     Status: None (Preliminary result)   Collection Time: 06/03/19  6:03 AM   Specimen: Pleural Fluid  Result Value Ref Range   Specimen Description PLEURAL RIGHT    Special Requests NONE    Gram Stain      MODERATE WBC PRESENT,BOTH PMN AND MONONUCLEAR NO ORGANISMS SEEN Performed at [facility], [place] [address], [place], [place] [address]    Culture PENDING    Report Status PENDING   Albumin, pleural or peritoneal fluid     Status: None   Collection Time: 06/03/19  7:04 AM  Result Value Ref Range   Albumin, Fluid 1.7 g/dL    Comment: (NOTE) No normal range established for this test Results should be evaluated in conjunction with serum values    Fluid Type-FALB PLEURAL RIGHT     Comment: Performed at [facility] [address], [place], [place] [address]  Lactate dehydrogenase (pleural or peritoneal fluid)     Status: Abnormal   Collection Time: 06/03/19  7:04 AM  Result Value Ref Range   LD, Fluid 224 (H) 3 - 23 U/L    Comment: (NOTE) Results should be evaluated in conjunction with serum values    Fluid Type-FLDH PLEURAL RIGHT     Comment: Performed  at [facility] [address], [place], [place] [address]  Glucose, pleural or peritoneal fluid     Status: None   Collection Time: 06/03/19  7:04 AM  Result Value Ref Range   Glucose, Fluid 103 mg/dL    Comment: (NOTE) No normal range established for this test Results should be evaluated in conjunction with serum values    Fluid Type-FGLU PLEURAL RIGHT     Comment: Performed at [facility] [address] [place], [place] [address]  Protein, pleural or peritoneal fluid     Status: None   Collection Time: 06/03/19  7:04 AM  Result Value Ref Range   Total protein, fluid 3.9 g/dL    Comment: (NOTE) No normal range established for this test Results should be evaluated in conjunction with serum values    Fluid Type-FTP PLEURAL RIGHT     Comment: Performed at [facility] [address] [place], [place] [address]  Body fluid cell count with differential     Status: Abnormal   Collection Time: 06/03/19  7:04 AM  Result Value Ref Range   Fluid Type-FCT PLEURAL RIGHT    Color, Fluid AMBER (A) YELLOW   Appearance, Fluid CLOUDY (A) CLEAR   WBC, Fluid 810 0 - 1,000 cu mm   Neutrophil Count, Fluid 66 (H) 0 - 25 %   Lymphs, Fluid 22 %   Monocyte-Macrophage-Serous Fluid 12 (L) 50 - 90 %   Other Cells, Fluid MESOTHELIAL CELLS PRESENT %    Comment: Performed at [facility], [place] [address], [place], [place] [address]  Lactate dehydrogenase     Status: None   Collection Time: 06/03/19  7:07 AM  Result Value Ref Range   LDH 168 98 - 192 U/L    Comment: Performed at [facility], [place]  [address] [place], [place] [address]  CBC     Status: Abnormal   Collection Time: 06/03/19  7:07 AM  Result Value Ref Range   WBC 12.6 (H) 4.0 - 10.5 K/uL   RBC 3.39 (L) 4.22 - 5.81 MIL/uL   Hemoglobin 9.0 (L) 13.0 - 17.0 g/dL   HCT 29.3 (L) 39.0 - 52.0 %   MCV 86.4 80.0 - 100.0 fL   MCH 26.5 26.0 - 34.0 pg   MCHC 30.7 30.0 - 36.0 g/dL   RDW 16.7 (H) 11.5 - 15.5 %    Platelets 411 (H) 150 - 400 K/uL   nRBC 0.0 0.0 - 0.2 %    Comment: Performed at [facility] [address] [place], [place] [address]  Heparin level (unfractionated)     Status: Abnormal   Collection Time: 06/03/19  8:36 AM  Result Value Ref Range   Heparin Unfractionated 1.88 (H) 0.30 - 0.70 IU/mL    Comment: RESULTS CONFIRMED BY MANUAL DILUTION Performed at [facility], [place] [address], [place], [place] [address]   APTT     Status: Abnormal   Collection Time: 06/03/19  8:36 AM  Result Value Ref Range   aPTT 51 (H) 24 - 36 seconds    Comment:        IF BASELINE aPTT IS ELEVATED, SUGGEST PATIENT RISK ASSESSMENT BE USED TO DETERMINE APPROPRIATE ANTICOAGULANT THERAPY. Performed at [facility], [place] [address], [place],  [address]    Dg Chest Port 1 View  Result Date: 06/03/2019 CLINICAL DATA:  Pleural effusion on the right EXAM: PORTABLE CHEST 1 VIEW COMPARISON:  Yesterday FINDINGS: Diminished right pleural effusion with residual potentially loculated laterally. Diffuse interstitial and airspace opacity. No pneumothorax. Normal heart size. Dual-chamber pacer leads from the right. IMPRESSION: No complicating feature after right thoracentesis. Pleural fluid has decreased to small volume. Electronically Signed   By: Monte Fantasia M.D.   On: 06/03/2019 07:00   Dg Chest Portable 1 View  Result Date: 06/02/2019 CLINICAL DATA:  Acute shortness of breath EXAM: PORTABLE CHEST 1 VIEW COMPARISON:  05/15/2019 FINDINGS: An enlarging RIGHT pleural effusion is noted, now moderate to large. Cardiomegaly and interstitial pulmonary edema again noted. Compressive RIGHT LOWER lung atelectasis noted. LEFT pacemaker again noted. No pneumothorax or acute bony abnormality. IMPRESSION: Enlarging RIGHT pleural effusion, now moderate to large. Interstitial pulmonary edema again noted. Electronically Signed   By: Margarette Canada M.D.   On: 06/02/2019 17:20    Pending Labs Unresulted Labs  (From admission, onward)    Start     Ordered   06/04/19 0500  Heparin level (unfractionated)  Daily,   R     06/03/19 0946   06/04/19 0500  APTT  Daily,   R     06/03/19 0946   06/04/19 0500  CBC  Daily,   R     06/03/19 0946   06/03/19 1800  APTT  Once-Timed,   STAT     06/03/19 0943   06/03/19 0525  Cholesterol, body fluid  (Thoracentesis Labs Panel)  Once,   STAT     06/03/19 0524   06/03/19 0523  PH, Body Fluid  (Thoracentesis Labs Panel)  Once,   STAT    Comments: Send specimen on ice.    06/03/19 0524   06/03/19 0523  Triglycerides, Body Fluid  (Thoracentesis Labs Panel)  Once,   STAT     06/03/19 0524   06/02/19 2313  Troponin I -  Now Then Q6H  Now then every 6 hours,   R (with STAT occurrences)     06/02/19 2312          Vitals/Pain Today's Vitals   06/03/19 1000 06/03/19 1200 06/03/19 1245 06/03/19 1400  BP: 121/79 (!) 123/51  (!) 127/47  Pulse: 77 74 68   Resp: (!) 24 (!) 21 (!) 32 18  Temp:      TempSrc:      SpO2: 98% 95% 100%   Weight:      Height:      PainSc:        Isolation Precautions No active isolations  Medications Medications  amiodarone (PACERONE) tablet 200 mg (200 mg Oral Given 06/03/19 1048)  amLODipine (NORVASC) tablet 5 mg (5 mg Oral Given 06/03/19 1049)  atorvastatin (LIPITOR) tablet 10 mg (10 mg Oral Given 06/03/19 1049)  potassium chloride SA (K-DUR) CR tablet 20 mEq (20 mEq Oral Given 06/03/19 1048)  albuterol (PROVENTIL) (2.5 MG/3ML) 0.083% nebulizer solution 2.5 mg (has no administration in time range)  acetaminophen (TYLENOL) tablet 650 mg (has no administration in time range)    Or  acetaminophen (TYLENOL) suppository 650 mg (has no administration in time range)  ondansetron (ZOFRAN) tablet 4 mg (has no administration in time range)    Or  ondansetron (ZOFRAN) injection 4 mg (has no administration in time range)  furosemide (LASIX) injection 60 mg (60 mg Intravenous Given 06/03/19 0839)  losartan (COZAAR) tablet 100 mg (100 mg  Oral Given 06/03/19 1048)    And  hydrochlorothiazide (HYDRODIURIL) tablet 25 mg (25 mg Oral Given 06/03/19 1048)  heparin ADULT infusion 100 units/mL (25000 units/279mL sodium chloride 0.45%) (900 Units/hr Intravenous Rate/Dose Change 06/03/19 0944)  furosemide (LASIX) injection 40 mg (40 mg Intravenous Given 06/02/19 1709)    Mobility walks with device Moderate fall risk   Focused Assessments Pulmonary Assessment Handoff:  Lung sounds: Bilateral Breath Sounds: Fine crackles, Diminished L Breath Sounds: Diminished, Fine crackles R Breath Sounds: Diminished, Fine crackles O2 Device: Nasal Cannula O2 Flow Rate (L/min): 4 L/min      R Recommendations: See Admitting Provider Note  Report given to:   Additional Notes:  Pt currently on 4L [place]. No pai. No SOB. 94%.

## 2019-06-03 NOTE — ED Notes (Addendum)
Pt becoming more dyspneic at rest. MD notified. RR 32 at rest.

## 2019-06-03 NOTE — Procedures (Signed)
Thoracentesis Procedure Note  Pre-operative Diagnosis: pleural effusion, right  Post-operative Diagnosis: same  Indications: acute hypoxemic respiratory failure  Procedure Details  Consent: Informed consent was obtained. Risks of the procedure were discussed including: infection, bleeding, pain, pneumothorax.  Under sterile conditions the patient was positioned. Betadine solution and sterile drapes were utilized.  1% plain lidocaine was used to anesthetize the 7th rib space. Fluid was obtained without any difficulties and minimal blood loss.  A dressing was applied to the wound and wound care instructions were provided.   Findings 1670 ml of bloody pleural fluid was obtained. A sample was sent cell counts, cultures, and chemical analysis.  Complications:  None; patient tolerated the procedure well.          Condition: stable  Plan A follow up chest x-ray was ordered. Bed Rest for 4 hours. Tylenol 650 mg. for pain.  Attending Attestation: I performed the procedure.  Renee Pain, MD Board Certified by the ABIM, [place]

## 2019-06-03 NOTE — ED Notes (Signed)
ORDERED BFAST TRAY  

## 2019-06-03 NOTE — ED Notes (Signed)
Pt transferred to hospital bed

## 2019-06-03 NOTE — ED Notes (Signed)
Heart healthy lunch tray ordered 

## 2019-06-03 NOTE — Progress Notes (Signed)
Pt arrived to unit via bed with staff in stable condition. Pt on 4L O2. Call bell within reach and bed alarm set. Pt instructed to call when assistance is needed. PCR MRSA swab and CHG bath completed. Pt placed on PC monitor. Will continue to monitor.

## 2019-06-03 NOTE — Consult Note (Signed)
NAME:  Andre [name] MRN:  [id number] DOB:  09-27-1923 LOS: 1 ADMISSION DATE:  06/02/2019  CONSULTATION DATE:  06/03/2019 REFERRING MD:  Dr. Hal Hope  REASON FOR CONSULTATION:  dyspnea   Initial Pulmonary/Critical Care Consultation  Brief History   N/A  History of present illness   This 85 y.o. Caucasian male reformed smoker presented to the [facility] Emergency Department via [facility] with complaints of progressively worsening dyspnea over the past 3 days.  The patient endorses dyspnea, exertional dyspnea, orthopnea, paroxysmal nocturnal dyspnea and nocturia.  He also notices that he has had some lower extremity edema, which she admits is a chronic problem for him.  He claims to have a history of congestive heart failure, although his most recent echocardiogram reported normal LVEF without diastolic dysfunction.  He also states that he was hospitalized for almost 3 months earlier this year for congestive heart failure and pneumonia.  He was discharged home almost 2 weeks ago.  He states that at the time of his discharge, he felt that he was back to his baseline.  Typically, he exercises daily by walking 1 mile.  However, he has not been able to do so in the past 3 days.  He denies fever chills.  He does report cough productive of mucus secretions.  No abdominal pain.  No diarrhea.  No abnormal blood loss.  REVIEW OF SYSTEMS Constitutional: No weight loss. No night sweats. No fever. No chills. No fatigue. HEENT: No headaches, dysphagia, sore throat, otalgia, nasal congestion, postnasal drip CV:  Orthopnea, paroxysmal nocturnal dyspnea, nocturia. Chronic lower extremity edema.  No chest pain, palpitations GI:  No abdominal pain, nausea, vomiting, diarrhea, change in bowel pattern, anorexia Resp: Dyspnea at rest and with exertion. Cough productive of mucous secretions.  No hemoptysis, wheezing.  GU: no dysuria, change in color of urine, no urgency or frequency.  No flank  pain. MS:  No joint pain or swelling. No myalgias,  No decreased range of motion.  Psych:  No change in mood or affect. No memory loss. Skin: no rash or lesions.   Past Medical/Surgical/Social/Family History   Past Medical History:  Diagnosis Date  . Acute on chronic respiratory failure with hypoxia ([place])   . Adenomatous colon polyp 04/1985   no further colonoscopy, 2008-last colonoscopy, no polyps    . Atrial fibrillation ([place])   . Chronic atrial fibrillation   . Chronic diastolic heart failure ([place])   . Diverticulosis   . History of skin cancer    dermatology every 6 months Dr. [name]  . Hyperlipidemia   . Hypertension   . Lobar pneumonia, unspecified organism ([place])   . Macular degeneration    bilateral  . Nephrolithiasis   . PAF (paroxysmal atrial fibrillation) ([place])   . PAT (paroxysmal atrial tachycardia) (HCC)    many years ago, worse with smoking  . Severe sepsis [facility])     Past Surgical History:  Procedure Laterality Date  . CARDIOVERSION N/A 05/23/2016   Procedure: CARDIOVERSION;  Surgeon: [name], MD;  Location: [facility] ENDOSCOPY;  Service: Cardiovascular;  Laterality: N/A;  . CARDIOVERSION N/A 03/01/2019   Procedure: CARDIOVERSION;  Surgeon: Lelon Perla, MD;  Location: [facility] ENDOSCOPY;  Service: Cardiovascular;  Laterality: N/A;  . CATARACT EXTRACTION     bilateral  . INGUINAL HERNIA REPAIR    . PACEMAKER IMPLANT N/A 02/24/2019   Procedure: PACEMAKER IMPLANT;  Surgeon: Deboraha Sprang, MD;  Location: [place] CV LAB;  Service:  Cardiovascular;  Laterality: N/A;  . TEE WITHOUT CARDIOVERSION N/A 03/22/2016   Procedure: TRANSESOPHAGEAL ECHOCARDIOGRAM (TEE);  Surgeon: [name], MD;  Location: [place];  Service: Cardiovascular;  Laterality: N/A;  . TEE WITHOUT CARDIOVERSION N/A 05/23/2016   Procedure: TRANSESOPHAGEAL ECHOCARDIOGRAM (TEE);  Surgeon: [name], MD;  Location: [facility] ENDOSCOPY;  Service: Cardiovascular;  Laterality: N/A;  . TEE  WITHOUT CARDIOVERSION N/A 03/01/2019   Procedure: TRANSESOPHAGEAL ECHOCARDIOGRAM (TEE);  Surgeon: Lelon Perla, MD;  Location: [facility] ENDOSCOPY;  Service: Cardiovascular;  Laterality: N/A;  . TENDON REPAIR  12/11   right leg    Social History   Tobacco Use  . Smoking status: Former Smoker    Packs/day: 1.00    Years: 60.00    Pack years: 60.00    Types: Cigarettes    Quit date: 12/13/2003    Years since quitting: 15.4  . Smokeless tobacco: Never Used  Substance Use Topics  . Alcohol use: Yes    Alcohol/week: 0.0 standard drinks    Comment: 2 glass of wine a day    Family History  Problem Relation Age of Onset  . Hypertension Mother   . Cancer Father        lung     Significant Hospital Events   N/A   Consults:  Pulmonary   Procedures:  N/A   Significant Diagnostic Tests:  N/A   Micro Data:   Results for orders placed or performed during the hospital encounter of 06/02/19  SARS Coronavirus 2 (CEPHEID- Performed in [place] hospital lab), Hosp Order     Status: None   Collection Time: 06/02/19  5:05 PM   Specimen: Nasopharyngeal Swab  Result Value Ref Range Status   SARS Coronavirus 2 NEGATIVE NEGATIVE Final    Comment: (NOTE) If result is NEGATIVE SARS-CoV-2 target nucleic acids are NOT DETECTED. The SARS-CoV-2 RNA is generally detectable in upper and lower  respiratory specimens during the acute phase of infection. The lowest  concentration of SARS-CoV-2 viral copies this assay can detect is 250  copies / mL. A negative result does not preclude SARS-CoV-2 infection  and should not be used as the sole basis for treatment or other  patient management decisions.  A negative result may occur with  improper specimen collection / handling, submission of specimen other  than nasopharyngeal swab, presence of viral mutation(s) within the  areas targeted by this assay, and inadequate number of viral copies  (<250 copies / mL). A negative result must be  combined with clinical  observations, patient history, and epidemiological information. If result is POSITIVE SARS-CoV-2 target nucleic acids are DETECTED. The SARS-CoV-2 RNA is generally detectable in upper and lower  respiratory specimens dur ing the acute phase of infection.  Positive  results are indicative of active infection with SARS-CoV-2.  Clinical  correlation with patient history and other diagnostic information is  necessary to determine patient infection status.  Positive results do  not rule out bacterial infection or co-infection with other viruses. If result is PRESUMPTIVE POSTIVE SARS-CoV-2 nucleic acids MAY BE PRESENT.   A presumptive positive result was obtained on the submitted specimen  and confirmed on repeat testing.  While 2019 novel coronavirus  (SARS-CoV-2) nucleic acids may be present in the submitted sample  additional confirmatory testing may be necessary for epidemiological  and / or clinical management purposes  to differentiate between  SARS-CoV-2 and other Sarbecovirus currently known to infect humans.  If clinically indicated additional testing with an alternate test  methodology [phone]) is advised. The SARS-CoV-2 RNA is generally  detectable in upper and lower respiratory sp ecimens during the acute  phase of infection. The expected result is Negative. Fact Sheet for Patients:  StrictlyIdeas.no Fact Sheet for Healthcare Providers: BankingDealers.co.za This test is not yet approved or cleared by the [place] FDA and has been authorized for detection and/or diagnosis of SARS-CoV-2 by FDA under an Emergency Use Authorization (EUA).  This EUA will remain in effect (meaning this test can be used) for the duration of the COVID-19 declaration under Section 564(b)(1) of the Act, 21 U.S.C. section 360bbb-3(b)(1), unless the authorization is terminated or revoked sooner. Performed at [facility], [place] [address], [place], [place] [address]       Antimicrobials:  N/A  Interim history/subjective:  N/A   Objective   BP (!) 126/56   Pulse 77   Temp 98.2 F (36.8 C) (Axillary)   Resp (!) 27   Ht 5' 5.75" (1.67 m)   Wt 59.4 kg   SpO2 100%   BMI 21.31 kg/m     Filed Weights   06/02/19 1552  Weight: 59.4 kg    Intake/Output Summary (Last 24 hours) at 06/03/2019 0500 Last data filed at 06/03/2019 0300 Gross per 24 hour  Intake -  Output 1100 ml  Net -1100 ml    Examination: GENERAL: alert, oriented to time, person and place, pleasant or well-developed. No acute distress. HEAD: normocephalic, atraumatic EYE: PERRLA, EOM intact, no scleral icterus, no pallor. NOSE: nares are patent. No polyps. No exudate. No sinus tenderness. THROAT/ORAL CAVITY: Normal dentition. No oral thrush. No exudate. Mucous membranes are moist. No tonsillar enlargement.  NECK: supple, no thyromegaly, no JVD, no lymphadenopathy. Trachea midline. CHEST/LUNG: symmetric in development and expansion. Diminished air entry in the right lower lung fields. R mid lung crackles. No wheezes. HEART: RRR without murmur, rub or gallop.  L pectoral pacemaker pocket. ABDOMEN: soft, nontender, nondistended. Normoactive bowel sounds. No rebound. No guarding. No hepatosplenomegaly. EXTREMITIES: Edema: 1+. No cyanosis. No clubbing. 2+ DP pulses LYMPHATIC: no cervical/axillary/inguinal lymph nodes appreciated MUSCULOSKELETAL: No point tenderness. No bulk atrophy. Joints: normal.  SKIN:  No rash or lesion. NEUROLOGIC: Doll's eyes intact. Corneal reflex intact. Spontaneous respirations intact. Cranial nerves II-XII are grossly symmetric and physiologic. Babinski absent. No sensory deficit. Motor: 5/5 @ RUE, 5/5 @ LUE, 5/5 @ RLL,  5/5 @ LLL.  DTR: 2+ @ R biceps, 2+ @ L biceps, 2+ @ R patellar,  2+ @ L patellar. No cerebellar signs. Gait was not assessed.   Resolved Hospital Problem list   N/A   Assessment & Plan:    ASSESSMENT/PLAN:  ASSESSMENT (included in the Hospital Problem List)  Principal Problem:   Acute respiratory failure with hypoxia ([place]) Active Problems:   Pleural effusion on right   Essential hypertension   Acute on chronic diastolic heart failure (HCC)   Atrial fibrillation/flutter   Pacemaker   CKD (chronic kidney disease) stage 3, GFR 30-59 ml/min (HCC)   By systems: PULMONARY  Large R pleural effusion  COPD/emphysema, presumably GOLD stage IV based on chronic hypoxemic respiratory failure, on home oxygen With recent history of hospitalization for "heart failure" but with leukocytosis, thoracentesis and pleural fluid analysis is warranted.   CARDIOVASCULAR  Paced rhythm  Chronic atrial fibrillation, on Eliquis  Complete heart block   RENAL  No acute issues   INFECTIOUS  No acute issues Pleural fluid analysis to rule out empyema and exudative effusion  PLAN/RECOMMENDATIONS   Thoracentesis at bedside  Pleural fluid analysis    My assessment, plan of care, findings, medications, side effects, etc. were discussed with: nurse and Dr. Hal Hope (Hospitalist).   Best practice:  Diet: Cardiac/Heart Healthy Pain/Anxiety/Delirium protocol (if indicated): N/A VAP protocol (if indicated): N/A DVT prophylaxis: Eliquis GI prophylaxis: famotidine Glucose control: N/A Mobility/Activity: OOB as tolerated CODE STATUS: FULL CODE Family Communication:  no family at bedside Disposition:    Labs   CBC: Recent Labs  Lab 06/02/19 1705 06/03/19 0237  WBC 14.1* 13.2*  NEUTROABS 11.1* 9.4*  HGB 8.5* 9.3*  HCT 27.8* 30.3*  MCV 86.6 84.6  PLT 497* 436*    Basic Metabolic Panel: Recent Labs  Lab 06/02/19 1705 06/02/19 2327 06/03/19 0237  NA 135  --  137  K 4.8  --  3.7  CL 101  --  102  CO2 24  --  25  GLUCOSE 114*  --  118*  BUN 29*  --  28*  CREATININE 1.84*  --  1.70*  CALCIUM 8.7*  --  8.6*  MG  --  2.0  --    GFR: Estimated Creatinine  Clearance: 22.8 mL/min (A) (by C-G formula based on SCr of 1.7 mg/dL (H)). Recent Labs  Lab 06/02/19 1705 06/02/19 1858 06/03/19 0237  WBC 14.1*  --  13.2*  LATICACIDVEN 1.9 0.8  --     Liver Function Tests: Recent Labs  Lab 06/02/19 1705 06/03/19 0237  AST 33 11*  ALT 18 14  ALKPHOS 63 62  BILITOT 1.1 0.3  PROT 6.3* 6.2*  ALBUMIN 2.3* 2.2*   No results for input(s): LIPASE, AMYLASE in the last 168 hours. No results for input(s): AMMONIA in the last 168 hours.  ABG    Component Value Date/Time   PHART 7.477 (H) 05/16/2019 0912   PCO2ART 41.3 05/16/2019 0912   PO2ART 66.8 (L) 05/16/2019 0912   HCO3 30.2 (H) 05/16/2019 0912   O2SAT 93.8 05/16/2019 0912     Coagulation Profile: No results for input(s): INR, PROTIME in the last 168 hours.  Cardiac Enzymes: Recent Labs  Lab 06/02/19 1705 06/02/19 2327 06/03/19 0237  TROPONINI 0.04* 0.04* 0.04*    HbA1C: No results found for: HGBA1C  CBG: No results for input(s): GLUCAP in the last 168 hours.   Review of Systems:   See above   Past Medical History   Past Medical History:  Diagnosis Date  . Acute on chronic respiratory failure with hypoxia ([place])   . Adenomatous colon polyp 04/1985   no further colonoscopy, 2008-last colonoscopy, no polyps    . Atrial fibrillation ([place])   . Chronic atrial fibrillation   . Chronic diastolic heart failure ([place])   . Diverticulosis   . History of skin cancer    dermatology every 6 months Dr. [name]  . Hyperlipidemia   . Hypertension   . Lobar pneumonia, unspecified organism ([place])   . Macular degeneration    bilateral  . Nephrolithiasis   . PAF (paroxysmal atrial fibrillation) ([place])   . PAT (paroxysmal atrial tachycardia) (HCC)    many years ago, worse with smoking  . Severe sepsis [facility])       Surgical History    Past Surgical History:  Procedure Laterality Date  . CARDIOVERSION N/A 05/23/2016   Procedure: CARDIOVERSION;  Surgeon: [name], MD;   Location: [place] ENDOSCOPY;  Service: Cardiovascular;  Laterality: N/A;  . CARDIOVERSION N/A 03/01/2019   Procedure: CARDIOVERSION;  Surgeon: Lelon Perla,  MD;  Location: [place];  Service: Cardiovascular;  Laterality: N/A;  . CATARACT EXTRACTION     bilateral  . INGUINAL HERNIA REPAIR    . PACEMAKER IMPLANT N/A 02/24/2019   Procedure: PACEMAKER IMPLANT;  Surgeon: Deboraha Sprang, MD;  Location: [place] CV LAB;  Service: Cardiovascular;  Laterality: N/A;  . TEE WITHOUT CARDIOVERSION N/A 03/22/2016   Procedure: TRANSESOPHAGEAL ECHOCARDIOGRAM (TEE);  Surgeon: [name], MD;  Location: [place];  Service: Cardiovascular;  Laterality: N/A;  . TEE WITHOUT CARDIOVERSION N/A 05/23/2016   Procedure: TRANSESOPHAGEAL ECHOCARDIOGRAM (TEE);  Surgeon: [name], MD;  Location: [facility] ENDOSCOPY;  Service: Cardiovascular;  Laterality: N/A;  . TEE WITHOUT CARDIOVERSION N/A 03/01/2019   Procedure: TRANSESOPHAGEAL ECHOCARDIOGRAM (TEE);  Surgeon: Lelon Perla, MD;  Location: [facility] ENDOSCOPY;  Service: Cardiovascular;  Laterality: N/A;  . TENDON REPAIR  12/11   right leg      Social History   Social History   Socioeconomic History  . Marital status: Married    Spouse name: Not on file  . Number of children: 5  . Years of education: Not on file  . Highest education level: Not on file  Occupational History  . Occupation: RETIRED    Employer: RETIRED  Social Needs  . Financial resource strain: Not on file  . Food insecurity    Worry: Not on file    Inability: Not on file  . Transportation needs    Medical: Not on file    Non-medical: Not on file  Tobacco Use  . Smoking status: Former Smoker    Packs/day: 1.00    Years: 60.00    Pack years: 60.00    Types: Cigarettes    Quit date: 12/13/2003    Years since quitting: 15.4  . Smokeless tobacco: Never Used  Substance and Sexual Activity  . Alcohol use: Yes    Alcohol/week: 0.0 standard drinks    Comment: 2 glass of wine a  day  . Drug use: No  . Sexual activity: Not on file  Lifestyle  . Physical activity    Days per week: Not on file    Minutes per session: Not on file  . Stress: Not on file  Relationships  . Social Herbalist on phone: Not on file    Gets together: Not on file    Attends religious service: Not on file    Active member of club or organization: Not on file    Attends meetings of clubs or organizations: Not on file    Relationship status: Not on file  Other Topics Concern  . Not on file  Social History Narrative   Married (64 years in 08/2015-goes to outside practice). 5 children. 8 grandchildren (lost 1 grandchild #9 to [place] 18,  Lost #9 to seizures)      Retired at [employer], high Cabin crew      Hobbies: time with family, go to beach (51 years in a row), travel      Family History    Family History  Problem Relation Age of Onset  . Hypertension Mother   . Cancer Father        lung   family history includes Cancer in his father; Hypertension in his mother.    Allergies No Known Allergies    Current Medications  Current Facility-Administered Medications:  .  acetaminophen (TYLENOL) tablet 650 mg, 650 mg, Oral, Q6H PRN **OR** acetaminophen (TYLENOL) suppository 650 mg, 650 mg, Rectal, Q6H PRN,  [name], MD .  albuterol (PROVENTIL) (2.5 MG/3ML) 0.083% nebulizer solution 2.5 mg, 2.5 mg, Inhalation, Q4H PRN, [name], MD .  amiodarone (PACERONE) tablet 200 mg, 200 mg, Oral, Daily, Gean Birchwood N, MD .  amLODipine (NORVASC) tablet 5 mg, 5 mg, Oral, Daily, [name], MD .  atorvastatin (LIPITOR) tablet 10 mg, 10 mg, Oral, Daily, [name], MD .  furosemide (LASIX) injection 60 mg, 60 mg, Intravenous, BID, [name], MD, 60 mg at 06/02/19 2359 .  heparin ADULT infusion 100 units/mL (25000 units/21mL sodium chloride 0.45%), 850 Units/hr, Intravenous, Continuous, Erenest Blank, RPH, Last Rate:  8.5 mL/hr at 06/03/19 0139, 850 Units/hr at 06/03/19 0139 .  losartan (COZAAR) tablet 100 mg, 100 mg, Oral, Daily **AND** hydrochlorothiazide (HYDRODIURIL) tablet 25 mg, 25 mg, Oral, Daily, [name], MD .  ondansetron (ZOFRAN) tablet 4 mg, 4 mg, Oral, Q6H PRN **OR** ondansetron (ZOFRAN) injection 4 mg, 4 mg, Intravenous, Q6H PRN, [name], MD .  potassium chloride SA (K-DUR) CR tablet 20 mEq, 20 mEq, Oral, Daily, [name], MD  Current Outpatient Medications:  .  albuterol (VENTOLIN HFA) 108 (90 Base) MCG/ACT inhaler, Inhale 1 puff into the lungs every 4 (four) hours as needed for wheezing or shortness of breath., Disp: 1 Inhaler, Rfl: 0 .  amiodarone (PACERONE) 200 MG tablet, Take 1 tablet (200 mg total) by mouth daily., Disp: 30 tablet, Rfl: 2 .  amLODipine (NORVASC) 5 MG tablet, Take 1 tablet (5 mg total) by mouth daily., Disp: 90 tablet, Rfl: 3 .  apixaban (ELIQUIS) 2.5 MG TABS tablet, Take 1 tablet (2.5 mg total) by mouth 2 (two) times daily., Disp: 180 tablet, Rfl: 3 .  atorvastatin (LIPITOR) 10 MG tablet, Take 1 tablet (10 mg total) by mouth daily., Disp: 90 tablet, Rfl: 3 .  furosemide (LASIX) 40 MG tablet, Take 1 tablet (40 mg total) by mouth 2 (two) times daily., Disp: 180 tablet, Rfl: 3 .  losartan-hydrochlorothiazide (HYZAAR) 100-25 MG tablet, Take 1 tablet by mouth daily., Disp: 90 tablet, Rfl: 3 .  Potassium Chloride ER 20 MEQ TBCR, Take 20 mEq by mouth daily as needed (as long as taking lasix)., Disp: 90 tablet, Rfl: 3  Home Medications  Prior to Admission medications   Medication Sig Start Date End Date Taking? Authorizing Provider  albuterol (VENTOLIN HFA) 108 (90 Base) MCG/ACT inhaler Inhale 1 puff into the lungs every 4 (four) hours as needed for wheezing or shortness of breath. 05/21/19   Shelly Coss, MD  amiodarone (PACERONE) 200 MG tablet Take 1 tablet (200 mg total) by mouth daily. 05/24/19   Lelon Perla, MD  amLODipine (NORVASC) 5  MG tablet Take 1 tablet (5 mg total) by mouth daily. 05/27/19   [name], MD  apixaban (ELIQUIS) 2.5 MG TABS tablet Take 1 tablet (2.5 mg total) by mouth 2 (two) times daily. 05/27/19   [name], MD  atorvastatin (LIPITOR) 10 MG tablet Take 1 tablet (10 mg total) by mouth daily. 05/27/19   [name], MD  furosemide (LASIX) 40 MG tablet Take 1 tablet (40 mg total) by mouth 2 (two) times daily. 05/31/19 05/25/20  Deboraha Sprang, MD  losartan-hydrochlorothiazide (HYZAAR) 100-25 MG tablet Take 1 tablet by mouth daily. 05/27/19   [name], MD  Potassium Chloride ER 20 MEQ TBCR Take 20 mEq by mouth daily as needed (as long as taking lasix). 05/27/19   Garret Reddish  Jenetta Downer, MD     Renee Pain, MD Board Certified by the ABIM, [place] Pager: [phone]

## 2019-06-03 NOTE — Progress Notes (Signed)
Patient seen by PCCM this AM.  Inocencio Homes done.  Fluid analysis is extudative.  I reviewed the CXR and previous CT.  High concern for malignancy specially with bloody fluid.  Not concerned for infection.  Would recommend a CT of the chest without contrast but more importantly palliative care.  Will need to f/u on cytology of the fluid prior to further interventions.  PCCM will see again in AM.  Rush Farmer, M.D. [facility] Pulmonary/Critical Care Medicine. Pager: [phone]. After hours pager: [phone].

## 2019-06-03 NOTE — ED Notes (Signed)
Pts daughter given update over the phone, paracentesis went well overnight, fluid drained, patient off bipap, 4L [place], still waiting on a bed upstairs. The patients daughter thanked Armed forces training and education officer for the update.

## 2019-06-03 NOTE — ED Notes (Signed)
Pt saying that he feels much better after being put on bipap. Pt appearing to have an easier time breathing. Less dyspnea.

## 2019-06-04 DIAGNOSIS — R1310 Dysphagia, unspecified: Secondary | ICD-10-CM

## 2019-06-04 LAB — CBC WITH DIFFERENTIAL/PLATELET
Abs Immature Granulocytes: 0.06 10*3/uL (ref 0.00–0.07)
Basophils Absolute: 0.1 10*3/uL (ref 0.0–0.1)
Basophils Relative: 1 %
Eosinophils Absolute: 0.3 10*3/uL (ref 0.0–0.5)
Eosinophils Relative: 2 %
HCT: 29.2 % — ABNORMAL LOW (ref 39.0–52.0)
Hemoglobin: 9.2 g/dL — ABNORMAL LOW (ref 13.0–17.0)
Immature Granulocytes: 1 %
Lymphocytes Relative: 24 %
Lymphs Abs: 3.1 10*3/uL (ref 0.7–4.0)
MCH: 26.3 pg (ref 26.0–34.0)
MCHC: 31.5 g/dL (ref 30.0–36.0)
MCV: 83.4 fL (ref 80.0–100.0)
Monocytes Absolute: 0.9 10*3/uL (ref 0.1–1.0)
Monocytes Relative: 7 %
Neutro Abs: 8.4 10*3/uL — ABNORMAL HIGH (ref 1.7–7.7)
Neutrophils Relative %: 65 %
Platelets: 404 10*3/uL — ABNORMAL HIGH (ref 150–400)
RBC: 3.5 MIL/uL — ABNORMAL LOW (ref 4.22–5.81)
RDW: 16.3 % — ABNORMAL HIGH (ref 11.5–15.5)
WBC: 12.9 10*3/uL — ABNORMAL HIGH (ref 4.0–10.5)
nRBC: 0 % (ref 0.0–0.2)

## 2019-06-04 LAB — APTT
aPTT: 63 seconds — ABNORMAL HIGH (ref 24–36)
aPTT: 85 seconds — ABNORMAL HIGH (ref 24–36)

## 2019-06-04 LAB — RENAL FUNCTION PANEL
Albumin: 2 g/dL — ABNORMAL LOW (ref 3.5–5.0)
Anion gap: 11 (ref 5–15)
BUN: 32 mg/dL — ABNORMAL HIGH (ref 8–23)
CO2: 27 mmol/L (ref 22–32)
Calcium: 8.5 mg/dL — ABNORMAL LOW (ref 8.9–10.3)
Chloride: 96 mmol/L — ABNORMAL LOW (ref 98–111)
Creatinine, Ser: 1.81 mg/dL — ABNORMAL HIGH (ref 0.61–1.24)
GFR calc Af Amer: 37 mL/min — ABNORMAL LOW (ref >60)
GFR calc non Af Amer: 32 mL/min — ABNORMAL LOW (ref >60)
Glucose, Bld: 99 mg/dL (ref 70–99)
Phosphorus: 4.8 mg/dL — ABNORMAL HIGH (ref 2.5–4.6)
Potassium: 3.4 mmol/L — ABNORMAL LOW (ref 3.5–5.1)
Sodium: 134 mmol/L — ABNORMAL LOW (ref 135–145)

## 2019-06-04 LAB — HEPARIN LEVEL (UNFRACTIONATED): Heparin Unfractionated: 1.26 IU/mL — ABNORMAL HIGH (ref 0.30–0.70)

## 2019-06-04 LAB — PH, BODY FLUID: pH, Body Fluid: 7.6

## 2019-06-04 NOTE — Progress Notes (Signed)
PCCM Note  Cytology still pending, will keep on PCCM list and revisit later today or in AM pending cytology for recommendations  Rush Farmer, M.D. [facility] Pulmonary/Critical Care Medicine. Pager: [phone]. After hours pager: [phone]

## 2019-06-04 NOTE — Progress Notes (Signed)
PROGRESS NOTE    JIMMIE RUETER  WYO:[id number] DOB: [birth year] DOA: 06/02/2019 PCP: [name], MD  Outpatient Specialists:   Brief Narrative:  As per H&P "CHRLES SELLEY is a 85 y.o. male with history of diastolic CHF last EF measured in March 2020 was 65%, chronic respiratory failure on home oxygen about 5 L, A. fib on amiodarone and apixaban, complete heart block status post pacemaker placement, chronic anemia presents to the ER because of worsening shortness of breath over the last couple of days.  Patient was recently admitted twice for CHF exacerbation.  During which patient also was intubated.  Patient states he was doing fine until last few days he became more short of breath denies any chest pain productive cough fever or chills.  He states he has been compliant with his Lasix and other medications.  Chart review shows that he had recently followed with cardiologist Dr. Caryl Comes.  ED Course: In the ER patient was hypoxic requiring 15 L oxygen and chest x-ray shows congestion with enlarging right-sided pleural effusion which at this time looks large.  Pulmonary critical care was consulted.  Patient was given Lasix 40 mg IV despite which patient was still short of breath I ordered another 60.  Patient admitted for acute respiratory failure with hypoxia likely from CHF exacerbation with right pleural effusion.  Other labs include creatinine of 1.8 hemoglobin 8.5 WBC 14.1 platelets 497 BNP 363 troponin 0.04 and EKG shows paced rhythm".  06/03/2019: Patient has undergone right-sided thoracentesis.  1.7 L of bloody fluid was removed.  CT scan of the chest without contrast ordered.  Pulmonary input is appreciated.  Concerns for possible malignancy.  Patient feels a lot better after the thoracentesis.  06/04/2019: Patient seen.  No new complaints.  Patient remains stable.  Pleural fluid analysis is pending.  Input from the pulmonary team is appreciated.  Will change heparin drip to Eliquis  once no further procedures planned.  Assessment & Plan:   Principal Problem:   Acute respiratory failure with hypoxia (HCC) Active Problems:   Essential hypertension   Acute on chronic diastolic heart failure (HCC)   Atrial fibrillation/flutter   Pacemaker   CKD (chronic kidney disease) stage 3, GFR 30-59 ml/min (HCC)   Pleural effusion on right  Acute respiratory failure with hypoxia: Likely multifactorial.  Patient has significant right-sided pleural effusion.  Patient also have CHF exacerbation.  However, patient has improved significantly after thoracentesis.  Follow pleural fluid analysis Continue Lasix Further management will depend on hospital course. 06/04/2019: Patient's respiratory status has remained stable.   History of atrial fibrillation: Patient is on amiodarone and apixaban.  Apixaban was held prior to thoracentesis. Optimize heart rate. 06/04/2019: Patient is still on heparin drip.  Will discontinue heparin now.  Restart Eliquis once no further procedures planned.  History of complete heart block: Patient is status post pacemaker placement.  Chronic kidney disease stage III: Serum creatinine appears to be at baseline.    History of COPD: Stable.   Anemia: Likely multifactorial.   Suspect anemia of chronic inflammation.    Hypertension: Continue to monitor and optimize.   Hyperlipidemia: On statins.   DVT prophylaxis: Heparin.  Restart apixaban 2.5 mg p.o. twice daily when okay with the pulmonary team. Code Status: Full code. Family Communication:  Disposition Plan: Home. Consults called: Pulmonary critical care.   Procedures:   Thoracentesis  Antimicrobials:   None   Subjective: No shortness of breath.   No fever or chills.  Objective: Vitals:   06/04/19 0300 06/04/19 0551 06/04/19 0747 06/04/19 1219  BP:   (!) 112/55 (!) 103/50  Pulse:   78 66  Resp:   18 19  Temp: 98.1 F (36.7 C)  (!) 97.4 F (36.3 C)   TempSrc: Oral   Oral   SpO2:   97% 99%  Weight:  56.8 kg    Height:        Intake/Output Summary (Last 24 hours) at 06/04/2019 1454 Last data filed at 06/04/2019 0546 Gross per 24 hour  Intake 1049.39 ml  Output 1250 ml  Net -200.61 ml   Filed Weights   06/02/19 1552 06/04/19 0551  Weight: 59.4 kg 56.8 kg    Examination:  General exam: Appears calm and comfortable  Respiratory system: Decreased air entry right lung field posteriorly Cardiovascular system: S1 & S2 heard Gastrointestinal system: Abdomen is nondistended, soft and nontender. No organomegaly or masses felt. Normal bowel sounds heard. Central nervous system: Alert and oriented.  Extremities. Extremities: 1-1+ right leg edema.  Data Reviewed: I have personally reviewed following labs and imaging studies  CBC: Recent Labs  Lab 06/02/19 1705 06/03/19 0237 06/03/19 0707 06/04/19 0427  WBC 14.1* 13.2* 12.6* 12.9*  NEUTROABS 11.1* 9.4*  --  8.4*  HGB 8.5* 9.3* 9.0* 9.2*  HCT 27.8* 30.3* 29.3* 29.2*  MCV 86.6 84.6 86.4 83.4  PLT 497* 436* 411* 119*   Basic Metabolic Panel: Recent Labs  Lab 06/02/19 1705 06/02/19 2327 06/03/19 0237 06/04/19 0427  NA 135  --  137 134*  K 4.8  --  3.7 3.4*  CL 101  --  102 96*  CO2 24  --  25 27  GLUCOSE 114*  --  118* 99  BUN 29*  --  28* 32*  CREATININE 1.84*  --  1.70* 1.81*  CALCIUM 8.7*  --  8.6* 8.5*  MG  --  2.0  --   --   PHOS  --   --   --  4.8*   GFR: Estimated Creatinine Clearance: 20.5 mL/min (A) (by C-G formula based on SCr of 1.81 mg/dL (H)). Liver Function Tests: Recent Labs  Lab 06/02/19 1705 06/03/19 0237 06/04/19 0427  AST 33 11*  --   ALT 18 14  --   ALKPHOS 63 62  --   BILITOT 1.1 0.3  --   PROT 6.3* 6.2*  --   ALBUMIN 2.3* 2.2* 2.0*   No results for input(s): LIPASE, AMYLASE in the last 168 hours. No results for input(s): AMMONIA in the last 168 hours. Coagulation Profile: No results for input(s): INR, PROTIME in the last 168 hours. Cardiac  Enzymes: Recent Labs  Lab 06/02/19 1705 06/02/19 2327 06/03/19 0237 06/03/19 1500  TROPONINI 0.04* 0.04* 0.04* 0.04*   BNP (last 3 results) No results for input(s): PROBNP in the last 8760 hours. HbA1C: No results for input(s): HGBA1C in the last 72 hours. CBG: No results for input(s): GLUCAP in the last 168 hours. Lipid Profile: No results for input(s): CHOL, HDL, LDLCALC, TRIG, CHOLHDL, LDLDIRECT in the last 72 hours. Thyroid Function Tests: No results for input(s): TSH, T4TOTAL, FREET4, T3FREE, THYROIDAB in the last 72 hours. Anemia Panel: No results for input(s): VITAMINB12, FOLATE, FERRITIN, TIBC, IRON, RETICCTPCT in the last 72 hours. Urine analysis:    Component Value Date/Time   COLORURINE STRAW (A) 05/15/2019 2150   APPEARANCEUR HAZY (A) 05/15/2019 2150   LABSPEC 1.005 05/15/2019 2150   PHURINE 6.0 05/15/2019 2150  GLUCOSEU NEGATIVE 05/15/2019 2150   HGBUR NEGATIVE 05/15/2019 2150   BILIRUBINUR NEGATIVE 05/15/2019 2150   BILIRUBINUR Negative 12/11/2018 0947   KETONESUR NEGATIVE 05/15/2019 2150   PROTEINUR NEGATIVE 05/15/2019 2150   UROBILINOGEN 0.2 12/11/2018 0947   UROBILINOGEN 0.2 12/08/2010 0639   NITRITE NEGATIVE 05/15/2019 2150   LEUKOCYTESUR MODERATE (A) 05/15/2019 2150   Sepsis Labs: @LABRCNTIP (procalcitonin:4,lacticidven:4)  ) Recent Results (from the past 240 hour(s))  SARS Coronavirus 2 (CEPHEID- Performed in [place] hospital lab), Hosp Order     Status: None   Collection Time: 06/02/19  5:05 PM   Specimen: Nasopharyngeal Swab  Result Value Ref Range Status   SARS Coronavirus 2 NEGATIVE NEGATIVE Final    Comment: (NOTE) If result is NEGATIVE SARS-CoV-2 target nucleic acids are NOT DETECTED. The SARS-CoV-2 RNA is generally detectable in upper and lower  respiratory specimens during the acute phase of infection. The lowest  concentration of SARS-CoV-2 viral copies this assay can detect is 250  copies / mL. A negative result does not  preclude SARS-CoV-2 infection  and should not be used as the sole basis for treatment or other  patient management decisions.  A negative result may occur with  improper specimen collection / handling, submission of specimen other  than nasopharyngeal swab, presence of viral mutation(s) within the  areas targeted by this assay, and inadequate number of viral copies  (<250 copies / mL). A negative result must be combined with clinical  observations, patient history, and epidemiological information. If result is POSITIVE SARS-CoV-2 target nucleic acids are DETECTED. The SARS-CoV-2 RNA is generally detectable in upper and lower  respiratory specimens dur ing the acute phase of infection.  Positive  results are indicative of active infection with SARS-CoV-2.  Clinical  correlation with patient history and other diagnostic information is  necessary to determine patient infection status.  Positive results do  not rule out bacterial infection or co-infection with other viruses. If result is PRESUMPTIVE POSTIVE SARS-CoV-2 nucleic acids MAY BE PRESENT.   A presumptive positive result was obtained on the submitted specimen  and confirmed on repeat testing.  While 2019 novel coronavirus  (SARS-CoV-2) nucleic acids may be present in the submitted sample  additional confirmatory testing may be necessary for epidemiological  and / or clinical management purposes  to differentiate between  SARS-CoV-2 and other Sarbecovirus currently known to infect humans.  If clinically indicated additional testing with an alternate test  methodology [phone]) is advised. The SARS-CoV-2 RNA is generally  detectable in upper and lower respiratory sp ecimens during the acute  phase of infection. The expected result is Negative. Fact Sheet for Patients:  StrictlyIdeas.no Fact Sheet for Healthcare Providers: BankingDealers.co.za This test is not yet approved or cleared by  the [place] FDA and has been authorized for detection and/or diagnosis of SARS-CoV-2 by FDA under an Emergency Use Authorization (EUA).  This EUA will remain in effect (meaning this test can be used) for the duration of the COVID-19 declaration under Section 564(b)(1) of the Act, 21 U.S.C. section 360bbb-3(b)(1), unless the authorization is terminated or revoked sooner. Performed at [facility], [place] [address], [place], [place] [address]   Body fluid culture (includes gram stain)     Status: None (Preliminary result)   Collection Time: 06/03/19  6:03 AM   Specimen: Pleural Fluid  Result Value Ref Range Status   Specimen Description PLEURAL RIGHT  Final   Special Requests NONE  Final   Gram Stain  Final    MODERATE WBC PRESENT,BOTH PMN AND MONONUCLEAR NO ORGANISMS SEEN    Culture   Final    NO GROWTH 1 DAY Performed at [facility], [place] [address] [place], [place] [address]    Report Status PENDING  Incomplete  MRSA PCR Screening     Status: None   Collection Time: 06/03/19  4:34 PM   Specimen: Nasopharyngeal  Result Value Ref Range Status   MRSA by PCR NEGATIVE NEGATIVE Final    Comment:        The GeneXpert MRSA Assay (FDA approved for NASAL specimens only), is one component of a comprehensive MRSA colonization surveillance program. It is not intended to diagnose MRSA infection nor to guide or monitor treatment for MRSA infections. Performed at [facility], [place] [address] [place], [place] [address]          Radiology Studies: Ct Chest Wo Contrast  Result Date: 06/04/2019 CLINICAL DATA:  Linear pleural effusion after thoracentesis. EXAM: CT CHEST WITHOUT CONTRAST TECHNIQUE: Multidetector CT imaging of the chest was performed following the standard protocol without IV contrast. COMPARISON:  Chest radiograph earlier this day.  Chest CT 04/01/2019 FINDINGS: Cardiovascular: Dense aortic atherosclerosis. No aneurysm. Left-sided pacemaker in  place is leads in the right atrium and ventricle. Mild cardiomegaly. Coronary artery calcifications. Mediastinum/Nodes: No enlarged mediastinal lymph nodes. Limited assessment for hilar adenopathy given lack of IV contrast. Right hilar calcifications likely calcified nodes. Unchanged 3.4 cm right thyroid nodule. Esophagus decompressed. Unchanged right anterior epicardial well-defined round mass lesion measuring 3.9 x 2.3 cm. Lungs/Pleura: Emphysema. Moderate right pleural effusion is partially loculated anterior laterally and medial about the mediastinum. Fluid measures simple fluid density without CT findings to suggest hemorrhage. There is adjacent compressive atelectasis. Mild right middle lobe bronchiectasis minimal left pleural thickening without effusion. Diffuse coarse interstitial markings throughout the left lung. Subpleural nodule in the anterior left upper lobe measuring 5 mm, image 57 series 5, more conspicuous than on prior exam. Mild left lower lobe bronchiectasis with mild subpleural reticulation, possible honeycombing. Trachea and mainstem bronchi are patent. No pneumothorax. Scattered calcified granuloma. Upper Abdomen: Right anterior epicardial lesion as described. No acute upper abdominal finding. Musculoskeletal: Chronic lower thoracic compression fracture. There are no acute or suspicious osseous abnormalities. IMPRESSION: 1. Moderate partially loculated right pleural effusion which measures simple fluid density, no CT findings to suggest hemorrhage. Associated compressive atelectasis. 2. Emphysema with diffuse interstitial coarsening, bronchiectasis, possible basilar honeycombing. Findings suggest underlying interstitial lung disease. 3. Subpleural 5 mm left upper lobe pulmonary nodule, more conspicuous than on prior exam. Recommend follow-up CT in 1 year, giving consideration for patient's advanced age. 4. Right anterior epicardial rounded soft tissue density is stable from exam 2 months ago,  indeterminate. Aortic Atherosclerosis (ICD10-I70.0) and Emphysema (ICD10-J43.9). Electronically Signed   By: Keith Rake M.D.   On: 06/04/2019 01:49   Dg Chest Port 1 View  Result Date: 06/03/2019 CLINICAL DATA:  Pleural effusion on the right EXAM: PORTABLE CHEST 1 VIEW COMPARISON:  Yesterday FINDINGS: Diminished right pleural effusion with residual potentially loculated laterally. Diffuse interstitial and airspace opacity. No pneumothorax. Normal heart size. Dual-chamber pacer leads from the right. IMPRESSION: No complicating feature after right thoracentesis. Pleural fluid has decreased to small volume. Electronically Signed   By: Monte Fantasia M.D.   On: 06/03/2019 07:00   Dg Chest Portable 1 View  Result Date: 06/02/2019 CLINICAL DATA:  Acute shortness of breath EXAM: PORTABLE CHEST 1 VIEW COMPARISON:  05/15/2019 FINDINGS: An enlarging RIGHT pleural effusion is noted, now moderate to large. Cardiomegaly and interstitial pulmonary edema again noted. Compressive RIGHT LOWER lung atelectasis noted. LEFT pacemaker again noted. No pneumothorax or acute bony abnormality. IMPRESSION: Enlarging RIGHT pleural effusion, now moderate to large. Interstitial pulmonary edema again noted. Electronically Signed   By: Margarette Canada M.D.   On: 06/02/2019 17:20        Scheduled Meds:  amiodarone  200 mg Oral Daily   amLODipine  5 mg Oral Daily   atorvastatin  10 mg Oral Daily   furosemide  60 mg Intravenous BID   hydrochlorothiazide  25 mg Oral Daily   And   losartan  100 mg Oral Daily   potassium chloride SA  20 mEq Oral Daily   Continuous Infusions:  heparin 1,150 Units/hr (06/04/19 0744)     LOS: 2 days    Time spent: 25 minutes    Dana Allan, MD  [facility] Pager #: [phone] 7PM-7AM contact night coverage as above

## 2019-06-04 NOTE — Consult Note (Signed)
   [facility] CM Inpatient Consult   06/04/2019  Andre [name] 22-Mar-1923 [id number]    Patient reviewed for a 30 day readmission and 3 hospitalizations in the past 6 months; also has 33% extreme high risk score for unplanned readmission; and to check for potential needs of [facility] care management services under his Medicare/ [employer] plan.  Review of patient's medical record and history and physical dated 06/02/19, reveal as:   [name] is a 85 y.o. male with history of diastolic CHF last EF measured in March 2020 was 65%, chronic respiratory failure on home oxygen about 5 L, A. fib on amiodarone and apixaban, complete heart block status post pacemaker placement, former smoker, chronic anemia-   presents to the ER because of worsening shortness of breath over the last couple of days. Patient was recently admitted twice for CHF exacerbation.  During which patient also was intubated.  Patient states he was doing fine until last few days he became more short of breath denies any chest pain productive cough fever or chills.  He states he has been compliant with his Lasix and other medications.  Chart review shows that he had recently followed with cardiologist Dr. Caryl Comes.   Patient has undergone right-sided thoracentesis with 1.7 L of bloody fluid was removed.  CT scan of the chest without contrast ordered. Concerns for possible malignancy.  Patient feels a lot better after the thoracentesis.  Primary Care Provider is Dr. Garret Reddish with [facility], listed as providing transition of care.  Plan:  Follow for progression, disposition and needs. Please place a [facility] Care Management consult as appropriate for follow-up post discharge.  Of note, [facility] Care Management services does not replace or interfere with any services that are arranged by transition of care case management or social work.    For questions and referral, please contact:  Terriona Horlacher A. Daquane Aguilar, BSN, RN-BC  [facility] Liaison Cell: [phone]

## 2019-06-04 NOTE — Progress Notes (Signed)
pts bp 109/45 pt symptomatic Dr. Marthenia Rolling notfied and verbal order to hold lasix at this time, will continue to monitor

## 2019-06-04 NOTE — Progress Notes (Addendum)
NAME:  Andre [name] MRN:  [id number] DOB:  Dec 13, 1922 LOS: 2 ADMISSION DATE:  06/02/2019  CONSULTATION DATE:  06/03/2019 REFERRING MD:  Dr. Hal Hope  REASON FOR CONSULTATION:  dyspnea   Initial Pulmonary/Critical Care Consultation  Brief History   85 year old former smoker. Admitted w/cc: shortness of breath, LE swelling and PND.  PCCM consulted for large right pleural effusion    Past Medical/Surgical/Social/Family History    Significant Hospital Events   N/A   Consults:  Pulmonary   Procedures:  Right thoracentesis 6/22: 1670 bloody exudate.    Significant Diagnostic Tests:  CT chest 6/22: 1. Moderate partially loculated right pleural effusion which measures simple fluid density, no CT findings to suggest hemorrhage. Associated compressive atelectasis. 2. Emphysema with diffuse interstitial coarsening, bronchiectasis, possible basilar honeycombing. Findings suggest underlying interstitial lung disease. 3. Subpleural 5 mm left upper lobe pulmonary nodule, more conspicuous than on prior exam. Recommend follow-up CT in 1 year, giving consideration for patient's advanced age. 4. Right anterior epicardial rounded soft tissue density is stable from exam 2 months ago, indeterminate.  Micro Data:       Antimicrobials:  N/A  Interim history/subjective:    Objective   Blood Pressure (Abnormal) 112/55   Pulse 78   Temperature (Abnormal) 97.4 F (36.3 C) (Oral)   Respiration 18   Height 5' 5.75" (1.67 m)   Weight 56.8 kg   Oxygen Saturation 97%   Body Mass Index 20.37 kg/m     Filed Weights   06/02/19 1552 06/04/19 0551  Weight: 59.4 kg 56.8 kg    Intake/Output Summary (Last 24 hours) at 06/04/2019 0919 Last data filed at 06/04/2019 0546 Gross per 24 hour  Intake 1049.39 ml  Output 1250 ml  Net -200.61 ml    Examination:  General: This is a pleasant 85 year old male patient resting in bed he is in no acute distress HEENT normocephalic atraumatic no  jugular venous distention does have mild temporal wasting Pulmonary: Diffuse scattered rhonchi no accessory use Cardiac: Regular rate and rhythm without murmur rub or gallop Abdomen: Soft nontender no organomegaly Extremities: Warm dry, dependent edema is appreciated Neuro: Awake oriented no focal deficits. Resolved Hospital Problem list   N/A   Assessment & Plan:  Acute on chronic hypoxic respiratory failure COPD (presume GOLD IV) Large exudative right pleural effusion Acute on chronic diastolic HF A fib/flutter  HTN  Discussion CT chest still showing partially loculated right effusion, small subpleural mass left anterior UL. To date the effusion is exudative but culture negative. The loculated component of this would suggest a element of chronicity. The fact that it is exudate and partially loculated raises concern about older parapneumonic process or even malignancy.   Now 1.3 liters negative. Feels much better. I also wonder about chronic aspiration   Plan Cont IV lasix as BUN/cr allow will d/w Dr Nelda Marseille re: residual loculated effusion. Need to decide if we should place CT or just diuresis and accept he will have some eventual pleural thickening. He is not a candidate for VATS. F/U cxr and results of cytology will help. If malignant might be best to consider pleur-x.  Will ask SLP to see.   Erick Colace ACNP-BC [place] Pager # [phone] OR # [phone] if no answer  Attending Note:  85 year old male with exudative pleural effusion.  Cytology remains pending.  No events overnight, no new complaints.  On exam, decreased BS at the bases.  I reviewed chest CT  myself, loculated effusion noted.  Discussed with PCCM-NP.  Pleural effusion:  - F/U on cytology  - If malignant then repeat imaging in a 4 weeks or if symptomatic, if recurs then will likely need a pleurex catheter and referral to oncology.  If not malignant then would not tap any further unless  symptomatic, could be an old parapneumonic effusion or traumatic.  Hypoxemia:  - Titrate O2 for sat of 88-92%  Deconditioning:  - PT as able  Dysphagia:  - SLP as ordered  PCCM will sign off, please call back if needed.  Patient seen and examined, agree with above note.  I dictated the care and orders written for this patient under my direction.  Rush Farmer, [place]

## 2019-06-04 NOTE — Progress Notes (Signed)
[facility] for Heparin (Apixaban on hold) Indication: atrial fibrillation  No Known Allergies  Patient Measurements: Height: 5' 5.75" (167 cm) Weight: 125 lb 3.5 oz (56.8 kg) IBW/kg (Calculated) : 63.23  Vital Signs: Temp: 98.1 F (36.7 C) (06/23 0300) Temp Source: Oral (06/23 0300)  Labs: Recent Labs    06/02/19 1705 06/02/19 2327 06/03/19 0237 06/03/19 0707 06/03/19 0836 06/03/19 1500 06/03/19 1823 06/04/19 0427  HGB 8.5*  --  9.3* 9.0*  --   --   --  9.2*  HCT 27.8*  --  30.3* 29.3*  --   --   --  29.2*  PLT 497*  --  436* 411*  --   --   --  404*  APTT  --   --   --   --  51*  --  58* 63*  HEPARINUNFRC  --   --   --   --  1.88*  --   --  1.26*  CREATININE 1.84*  --  1.70*  --   --   --   --  1.81*  TROPONINI 0.04* 0.04* 0.04*  --   --  0.04*  --   --     Estimated Creatinine Clearance: 20.5 mL/min (A) (by C-G formula based on SCr of 1.81 mg/dL (H)).  Assessment: 85 y/o M presents to the ED with 2 day history of worsening shortness of breath on and holding apixaban during acute illness. Heparin level is elevated at 1.26 as expected due to influence of apixaban.  -aPTT up to 63,   Goal of Therapy:  Heparin level 0.3-0.7 units/ml aPTT 66-102 seconds Monitor platelets by anticoagulation protocol: Yes   Plan:  Increase heparin drip to 1150 units/hr APTT in 8 hours  Daily aPTT, heparin level and CBC  Gigi Onstad A. Levada Dy, PharmD, [place] Pager: [phone] Please utilize Amion for appropriate phone number to reach the unit pharmacist ([place])

## 2019-06-05 ENCOUNTER — Encounter (HOSPITAL_COMMUNITY): Payer: Self-pay | Admitting: Emergency Medicine

## 2019-06-05 ENCOUNTER — Emergency Department (HOSPITAL_COMMUNITY): Payer: Medicare Other

## 2019-06-05 ENCOUNTER — Other Ambulatory Visit: Payer: Self-pay | Admitting: *Deleted

## 2019-06-05 ENCOUNTER — Other Ambulatory Visit: Payer: Self-pay

## 2019-06-05 ENCOUNTER — Inpatient Hospital Stay (HOSPITAL_COMMUNITY)
Admission: EM | Admit: 2019-06-05 | Discharge: 2019-06-08 | DRG: 291 | Disposition: A | Discharge status: Hospice | Payer: Medicare Other | Attending: Internal Medicine | Admitting: Internal Medicine

## 2019-06-05 DIAGNOSIS — I959 Hypotension, unspecified: Secondary | ICD-10-CM

## 2019-06-05 DIAGNOSIS — Z85828 Personal history of other malignant neoplasm of skin: Secondary | ICD-10-CM

## 2019-06-05 DIAGNOSIS — J439 Emphysema, unspecified: Secondary | ICD-10-CM | POA: Diagnosis present

## 2019-06-05 DIAGNOSIS — Z1159 Encounter for screening for other viral diseases: Secondary | ICD-10-CM

## 2019-06-05 DIAGNOSIS — I480 Paroxysmal atrial fibrillation: Secondary | ICD-10-CM | POA: Diagnosis present

## 2019-06-05 DIAGNOSIS — Z86010 Personal history of colonic polyps: Secondary | ICD-10-CM

## 2019-06-05 DIAGNOSIS — I5032 Chronic diastolic (congestive) heart failure: Secondary | ICD-10-CM

## 2019-06-05 DIAGNOSIS — I130 Hypertensive heart and chronic kidney disease with heart failure and stage 1 through stage 4 chronic kidney disease, or unspecified chronic kidney disease: Secondary | ICD-10-CM | POA: Diagnosis not present

## 2019-06-05 DIAGNOSIS — H3530 Unspecified macular degeneration: Secondary | ICD-10-CM | POA: Diagnosis present

## 2019-06-05 DIAGNOSIS — Z515 Encounter for palliative care: Secondary | ICD-10-CM

## 2019-06-05 DIAGNOSIS — R0602 Shortness of breath: Secondary | ICD-10-CM

## 2019-06-05 DIAGNOSIS — N183 Chronic kidney disease, stage 3 unspecified: Secondary | ICD-10-CM | POA: Diagnosis present

## 2019-06-05 DIAGNOSIS — I4892 Unspecified atrial flutter: Secondary | ICD-10-CM | POA: Diagnosis present

## 2019-06-05 DIAGNOSIS — Z8249 Family history of ischemic heart disease and other diseases of the circulatory system: Secondary | ICD-10-CM

## 2019-06-05 DIAGNOSIS — Z7901 Long term (current) use of anticoagulants: Secondary | ICD-10-CM

## 2019-06-05 DIAGNOSIS — Z950 Presence of cardiac pacemaker: Secondary | ICD-10-CM

## 2019-06-05 DIAGNOSIS — I5043 Acute on chronic combined systolic (congestive) and diastolic (congestive) heart failure: Secondary | ICD-10-CM | POA: Diagnosis present

## 2019-06-05 DIAGNOSIS — I4819 Other persistent atrial fibrillation: Secondary | ICD-10-CM

## 2019-06-05 DIAGNOSIS — M6281 Muscle weakness (generalized): Secondary | ICD-10-CM | POA: Diagnosis present

## 2019-06-05 DIAGNOSIS — R531 Weakness: Secondary | ICD-10-CM

## 2019-06-05 DIAGNOSIS — J849 Interstitial pulmonary disease, unspecified: Secondary | ICD-10-CM | POA: Diagnosis present

## 2019-06-05 DIAGNOSIS — Z66 Do not resuscitate: Secondary | ICD-10-CM | POA: Diagnosis present

## 2019-06-05 DIAGNOSIS — Z809 Family history of malignant neoplasm, unspecified: Secondary | ICD-10-CM

## 2019-06-05 DIAGNOSIS — Z87442 Personal history of urinary calculi: Secondary | ICD-10-CM

## 2019-06-05 DIAGNOSIS — I5033 Acute on chronic diastolic (congestive) heart failure: Secondary | ICD-10-CM | POA: Diagnosis present

## 2019-06-05 DIAGNOSIS — N189 Chronic kidney disease, unspecified: Secondary | ICD-10-CM | POA: Diagnosis present

## 2019-06-05 DIAGNOSIS — E785 Hyperlipidemia, unspecified: Secondary | ICD-10-CM | POA: Diagnosis present

## 2019-06-05 DIAGNOSIS — I4891 Unspecified atrial fibrillation: Secondary | ICD-10-CM | POA: Diagnosis present

## 2019-06-05 DIAGNOSIS — Z7189 Other specified counseling: Secondary | ICD-10-CM

## 2019-06-05 DIAGNOSIS — N179 Acute kidney failure, unspecified: Secondary | ICD-10-CM

## 2019-06-05 DIAGNOSIS — E7800 Pure hypercholesterolemia, unspecified: Secondary | ICD-10-CM

## 2019-06-05 DIAGNOSIS — E8809 Other disorders of plasma-protein metabolism, not elsewhere classified: Secondary | ICD-10-CM | POA: Diagnosis present

## 2019-06-05 DIAGNOSIS — J90 Pleural effusion, not elsewhere classified: Secondary | ICD-10-CM

## 2019-06-05 DIAGNOSIS — I442 Atrioventricular block, complete: Secondary | ICD-10-CM | POA: Diagnosis present

## 2019-06-05 DIAGNOSIS — J9621 Acute and chronic respiratory failure with hypoxia: Secondary | ICD-10-CM

## 2019-06-05 DIAGNOSIS — Z79899 Other long term (current) drug therapy: Secondary | ICD-10-CM

## 2019-06-05 DIAGNOSIS — J918 Pleural effusion in other conditions classified elsewhere: Secondary | ICD-10-CM | POA: Diagnosis present

## 2019-06-05 DIAGNOSIS — D509 Iron deficiency anemia, unspecified: Secondary | ICD-10-CM | POA: Diagnosis present

## 2019-06-05 DIAGNOSIS — E871 Hypo-osmolality and hyponatremia: Secondary | ICD-10-CM | POA: Diagnosis present

## 2019-06-05 DIAGNOSIS — E876 Hypokalemia: Secondary | ICD-10-CM | POA: Diagnosis present

## 2019-06-05 DIAGNOSIS — I5023 Acute on chronic systolic (congestive) heart failure: Secondary | ICD-10-CM

## 2019-06-05 DIAGNOSIS — D72829 Elevated white blood cell count, unspecified: Secondary | ICD-10-CM | POA: Diagnosis present

## 2019-06-05 DIAGNOSIS — I482 Chronic atrial fibrillation, unspecified: Secondary | ICD-10-CM | POA: Diagnosis present

## 2019-06-05 LAB — BASIC METABOLIC PANEL
Anion gap: 12 (ref 5–15)
BUN: 39 mg/dL — ABNORMAL HIGH (ref 8–23)
CO2: 26 mmol/L (ref 22–32)
Calcium: 8.4 mg/dL — ABNORMAL LOW (ref 8.9–10.3)
Chloride: 95 mmol/L — ABNORMAL LOW (ref 98–111)
Creatinine, Ser: 2.14 mg/dL — ABNORMAL HIGH (ref 0.61–1.24)
GFR calc Af Amer: 30 mL/min — ABNORMAL LOW (ref >60)
GFR calc non Af Amer: 26 mL/min — ABNORMAL LOW (ref >60)
Glucose, Bld: 142 mg/dL — ABNORMAL HIGH (ref 70–99)
Potassium: 3.8 mmol/L (ref 3.5–5.1)
Sodium: 133 mmol/L — ABNORMAL LOW (ref 135–145)

## 2019-06-05 LAB — CBC
HCT: 28.5 % — ABNORMAL LOW (ref 39.0–52.0)
Hemoglobin: 8.9 g/dL — ABNORMAL LOW (ref 13.0–17.0)
MCH: 25.6 pg — ABNORMAL LOW (ref 26.0–34.0)
MCHC: 31.2 g/dL (ref 30.0–36.0)
MCV: 82.1 fL (ref 80.0–100.0)
Platelets: 352 10*3/uL (ref 150–400)
RBC: 3.47 MIL/uL — ABNORMAL LOW (ref 4.22–5.81)
RDW: 16 % — ABNORMAL HIGH (ref 11.5–15.5)
WBC: 12 10*3/uL — ABNORMAL HIGH (ref 4.0–10.5)
nRBC: 0 % (ref 0.0–0.2)

## 2019-06-05 LAB — CBC WITH DIFFERENTIAL/PLATELET
Abs Immature Granulocytes: 0.09 10*3/uL — ABNORMAL HIGH (ref 0.00–0.07)
Basophils Absolute: 0 10*3/uL (ref 0.0–0.1)
Basophils Relative: 0 %
Eosinophils Absolute: 0 10*3/uL (ref 0.0–0.5)
Eosinophils Relative: 0 %
HCT: 27.6 % — ABNORMAL LOW (ref 39.0–52.0)
Hemoglobin: 8.6 g/dL — ABNORMAL LOW (ref 13.0–17.0)
Immature Granulocytes: 1 %
Lymphocytes Relative: 10 %
Lymphs Abs: 1.5 10*3/uL (ref 0.7–4.0)
MCH: 26.1 pg (ref 26.0–34.0)
MCHC: 31.2 g/dL (ref 30.0–36.0)
MCV: 83.9 fL (ref 80.0–100.0)
Monocytes Absolute: 0.7 10*3/uL (ref 0.1–1.0)
Monocytes Relative: 5 %
Neutro Abs: 11.7 10*3/uL — ABNORMAL HIGH (ref 1.7–7.7)
Neutrophils Relative %: 84 %
Platelets: 361 10*3/uL (ref 150–400)
RBC: 3.29 MIL/uL — ABNORMAL LOW (ref 4.22–5.81)
RDW: 16.2 % — ABNORMAL HIGH (ref 11.5–15.5)
WBC: 14 10*3/uL — ABNORMAL HIGH (ref 4.0–10.5)
nRBC: 0 % (ref 0.0–0.2)

## 2019-06-05 LAB — CHOLESTEROL, BODY FLUID: Cholesterol, Fluid: 59 mg/dL

## 2019-06-05 LAB — HEPARIN LEVEL (UNFRACTIONATED): Heparin Unfractionated: 0.99 IU/mL — ABNORMAL HIGH (ref 0.30–0.70)

## 2019-06-05 LAB — TRIGLYCERIDES, BODY FLUIDS: Triglycerides, Fluid: 23 mg/dL

## 2019-06-05 LAB — APTT: aPTT: 77 seconds — ABNORMAL HIGH (ref 24–36)

## 2019-06-05 LAB — BRAIN NATRIURETIC PEPTIDE: B Natriuretic Peptide: 264.6 pg/mL — ABNORMAL HIGH (ref 0.0–100.0)

## 2019-06-05 LAB — LACTIC ACID, PLASMA: Lactic Acid, Venous: 1.8 mmol/L (ref 0.5–1.9)

## 2019-06-05 MED ORDER — SODIUM CHLORIDE 0.9 % IV BOLUS
500.0000 mL | Freq: Once | INTRAVENOUS | Status: AC
  Administered 2019-06-05: 500 mL via INTRAVENOUS

## 2019-06-05 NOTE — Progress Notes (Signed)
[place] to be D/C'd Home per MD order.  Discussed with the patient and all questions fully answered.  VSS, Skin clean, dry and intact without evidence of skin break down, no evidence of skin tears noted. IV catheter discontinued intact. Site without signs and symptoms of complications. Dressing and pressure applied.  An After Visit Summary was printed and given to the patient. Patient received prescription.  D/c education completed with patient/family including follow up instructions, medication list, d/c activities limitations if indicated, with other d/c instructions as indicated by MD - patient able to verbalize understanding, all questions fully answered.   Patient instructed to return to ED, call 911, or call MD for any changes in condition.   Patient escorted via [place], and D/C home via private auto.  Luci Bank 06/05/2019 4:37 PM

## 2019-06-05 NOTE — TOC Transition Note (Signed)
Transition of Care [facility]) - CM/SW Discharge Note   Patient Details  Name: Andre [name] MRN: [id number] Date of Birth: 06/09/1923  Transition of [facility]) CM/SW Contact:  Sharin Mons, RN Phone Number: 06/05/2019, 4:02 PM   Clinical Narrative:    Admitted with acute respiratory failure with hypoxia, R pleural effusion and  s/p thoracentesis. PMH includes HTN, dCHF, a fib, s/p pacemaker, CKD, and on home oxygen. Multi admits. From home with wife. States has 24/7 caregiver @ home.   Plan: transition to home today with the resumption of home health services. Wife to provide transportation to home. [name] (Daughter) [name] (Spouse)      [phone]         PCP: Garret Reddish   Final next level of care: Home w Home Health Services Barriers to Discharge: No Barriers Identified   Patient Goals and CMS Choice Patient states their goals for this hospitalization and ongoing recovery are:: go home CMS Medicare.gov Compare Post Acute Care list provided to:: Patient Choice offered to / list presented to : Patient  Discharge Placement                       Discharge Plan and Services                DME Arranged: N/A DME Agency: NA       HH Arranged: RN, PT(resumption of home health services) [place] Agency: [place] ([place]) Date [place]: 06/05/19 Time [place]: 1200 Representative spoke with at [place]: [place] ([place]) Interventions     Readmission Risk Interventions Readmission Risk Prevention Plan 06/05/2019 05/20/2019 03/08/2019  Transportation Screening Complete Complete Complete  PCP or Specialist Appt within 5-7 Days - - Complete  PCP or Specialist Appt within 3-5 Days - Not Complete -  Not Complete comments - not yet ready for d/c -  Home Care Screening - - Complete  Medication Review (RN CM) - - Complete  [place]RI or [place] - Complete -  Social  Work Consult for [place] Planning/Counseling - Complete -  Palliative Care Screening - Not Applicable -  Medication Review Press photographer) Complete Complete -  PCP or Specialist appointment within 3-5 days of discharge Complete - -  [place] or Home Care Consult Complete - -  SW Recovery Care/Counseling Consult Complete - -  Palliative Care Screening Not Applicable - -  [place] Not Applicable - -  Some recent data might be hidden

## 2019-06-05 NOTE — Consult Note (Signed)
   [facility] Inpatient Consult   06/05/2019  AZELL BILL [birth year] [phone]    Follow-up note:  Patient being followed for disposition and needs for [facility] care management services for community follow-up as a benefit of Medicare/ Next Gen plan.  Reached out to Inpatient transition of care RN CM earlier today, however, was informed that disposition still to be finalized. Awaiting for call back.  Called to speak to patient in his room but patient transitioned to home prior to speaking with him. Attempted to call patient on his contact number listed in Epic but was unsuccessful/ no response.  Review of transition of care RN CM note, shows patient is from home with wife prior to admission and transitioning to home with 24/7 caregiver at home, and also with the resumption of home health services (RN, PT with [facility]).  .  Patient may benefit from EMMI calls to follow-up his continued recoveryafter discharge. Referral made for [facility] General calls post discharge.  Primary Care Provider isDr. Garret Reddish with [place] at [facility], listed as providing transition of care.   For questions and additional information, please call:  Kadiatou Oplinger A. Aneesh Faller, BSN, RN-BC [facility] Liaison Cell: [phone]

## 2019-06-05 NOTE — ED Triage Notes (Signed)
Pt BIB [facility] from home, pt was walking, felt weakness in both legs and had a near fall. Denies LOC, states he has been feeling more short of breath than usual. Home health aid increased pt's home O2 to 8L [place], pt arrived on 6L [place]. Reports improvement of shortness of breath. EMS VS: BP 90/60, SpO2 94% 6L [place].

## 2019-06-05 NOTE — Discharge Summary (Signed)
Physician Discharge Summary  Patient ID: Andre [name] MRN: [id number] DOB/AGE: 03-14-1923 85 y.o.  Admit date: 06/02/2019 Discharge date: 06/05/2019  Admission Diagnoses:  Discharge Diagnoses:  Principal Problem:   Acute respiratory failure with hypoxia [facility]) Active Problems:   Essential hypertension   Acute on chronic diastolic heart failure (HCC)   Atrial fibrillation/flutter   Pacemaker   CKD (chronic kidney disease) stage 3, GFR 30-59 ml/min (HCC)   Pleural effusion on right   Discharged Condition: stable  Hospital Course: Patient is a 85 year old Caucasian male with past medical history significant for diastolic congestive heart failure (last EF measured in March 2020 was 65%), chronic respiratory failure on home oxygen about 5 L per minute via nasal cannula, A. fib on amiodarone and apixaban, complete heart block status post pacemaker placement and chronic anemia.  Patient was admitted with shortness of breath.  According to the patient, he was doing well until a few days prior to presentation when he became more short of breath.  Patient denied associated chest pain, fever or chills or productive cough. Patient reported that he had been compliant with his diuretics.  On presentation to the hospital, patient was quite hypoxic, requiring 15 L of supplemental oxygen.  Chest x-ray done on presentation revealed congestion with large right-sided pleural effusion. Patient was admitted for further assessment and management.  Patient was started on IV Lasix.  Pulmonary team was consulted to assist with management of the right-sided pleural effusion.  Patient underwent right-sided thoracentesis today yielded 1.7 L of bloody fluid.  Following thoracentesis, patient symptoms improved significantly.  Pleural fluid was exudative.  Patient has been cleared for discharge by the pulmonary team.  Patient will follow with the primary care provider in the pulmonary team on discharge.  Acute  respiratory failure with hypoxia: Likely multifactorial.  Patient had significant right-sided pleural effusion.  Patient could also have CHF exacerbation.  Patient symptoms improved significantly with thoracentesis. Patient was continued on diuretics. Patient has improved significantly and will be discharged to the care of the primary care    History of atrial fibrillation: Patient will be continued on amiodarone and apixaban.  Apixaban was held prior to thoracentesis. Heart rate has remained controlled.  History of complete heart block: Patient is status post pacemaker placement.  Chronic kidney disease stage III: Serum creatinine appeared to be at baseline.   History of COPD: Stable.   Anemia: Likely multifactorial.   Likely anemia of chronic inflammation.    Hypertension: Continue to monitor and optimize.   Hyperlipidemia: On statins.  Consults: pulmonary/intensive care  Significant Diagnostic Studies:  Thoracentesis for right-sided pleural fluid. Pleural fluid analysis was exudative.  Discharge Exam: Blood pressure (!) 151/52, pulse 79, temperature 98 F (36.7 C), resp. rate 17, height 5' 5.75" (1.67 m), weight 58 kg, SpO2 97 %.  Disposition: Discharge disposition: 01-Home or Self Care   Discharge Instructions    Diet - low sodium heart healthy   Complete by: As directed    Increase activity slowly   Complete by: As directed      Allergies as of 06/05/2019   No Known Allergies     Medication List    TAKE these medications   albuterol 108 (90 Base) MCG/ACT inhaler Commonly known as: VENTOLIN HFA Inhale 1 puff into the lungs every 4 (four) hours as needed for wheezing or shortness of breath.   amiodarone 200 MG tablet Commonly known as: Pacerone Take 1 tablet (200 mg total) by mouth daily.  amLODipine 5 MG tablet Commonly known as: NORVASC Take 1 tablet (5 mg total) by mouth daily.   apixaban 2.5 MG Tabs tablet Commonly known as:  Eliquis Take 1 tablet (2.5 mg total) by mouth 2 (two) times daily. What changed: when to take this   atorvastatin 10 MG tablet Commonly known as: LIPITOR Take 1 tablet (10 mg total) by mouth daily.   furosemide 40 MG tablet Commonly known as: Lasix Take 1 tablet (40 mg total) by mouth 2 (two) times daily. What changed: how much to take   losartan-hydrochlorothiazide 100-25 MG tablet Commonly known as: HYZAAR Take 1 tablet by mouth daily.   Potassium Chloride ER 20 MEQ Tbcr Take 20 mEq by mouth daily as needed (as long as taking lasix). What changed: when to take this        Signed: Bonnell Public 06/05/2019, 11:18 AM

## 2019-06-05 NOTE — Evaluation (Signed)
Physical Therapy Evaluation Patient Details Name: Andre [name] MRN: [id number] DOB: 15-Oct-1923 Today's Date: 06/05/2019   History of Present Illness  Pt is a 85 y/o male admitted secondary to acute respiratory failure with hypoxia. Found to have R pleural effusion and is s/p thoracentesis. PMH includes HTN, dCHF, a fib, s/p pacemaker, CKD, and on home oxygen.   Clinical Impression  Pt admitted secondary to problem above with deficits below. Pt with increased SOB during gait training. Initially requiring min guard A, however, with increased SOB, pt becoming notably unsteady and required mod A to transfer to chair for seated rest. Oxygen sats fluctuating from mid 80s to low 90s on 4L during gait. At end of session, pt's oxygen sats at 94-95% on 4L. Pt very adamant about return home. Reports he has 24/7 caregiver support if needed. Educated about energy conservation techniques and safety with mobility at home. Will continue to follow acutely to maximize functional mobility independence and safety.     Follow Up Recommendations Home health PT;Supervision/Assistance - 24 hour    Equipment Recommendations  None recommended by PT    Recommendations for Other Services       Precautions / Restrictions Precautions Precautions: Fall;Other (comment) Precaution Comments: monitor O2 sats Restrictions Weight Bearing Restrictions: No      Mobility  Bed Mobility Overal bed mobility: Needs Assistance Bed Mobility: Supine to Sit     Supine to sit: Supervision     General bed mobility comments: Supervision for safety and line management.   Transfers Overall transfer level: Needs assistance Equipment used: Rolling walker (2 wheeled) Transfers: Sit to/from Stand Sit to Stand: Min guard         General transfer comment: Min guard for steadying assist. Cues for safe hand placement.   Ambulation/Gait Ambulation/Gait assistance: Min guard;Mod assist Gait Distance (Feet): 50 Feet(40',  10') Assistive device: Rolling walker (2 wheeled) Gait Pattern/deviations: Step-through pattern Gait velocity: Decreased    General Gait Details: Pt with notable SOB during gait. With increased SOB, pt noted to have increased instability and required mod A to safely transfer to chair for seated rest in middle of gait training. Otherwise requiring min guard for steadying assist. Oxygen sats fluctuating between mid 80s and low 90s on 4L during gait.   Stairs            Wheelchair Mobility    Modified Rankin (Stroke Patients Only)       Balance Overall balance assessment: Needs assistance Sitting-balance support: No upper extremity supported;Feet supported Sitting balance-Leahy Scale: Fair     Standing balance support: Bilateral upper extremity supported;During functional activity Standing balance-Leahy Scale: Poor Standing balance comment: Reliant on BUE support                              Pertinent Vitals/Pain Pain Assessment: No/denies pain    Home Living Family/patient expects to be discharged to:: Private residence Living Arrangements: Spouse/significant other Available Help at Discharge: Family;Personal care attendant;Available 24 hours/day Type of Home: House Home Access: Stairs to enter Entrance Stairs-Rails: Left;Right;Can reach both Entrance Stairs-Number of Steps: 5 Home Layout: One level Home Equipment: Walker - 2 wheels Additional Comments: Reports he has 24/7 caregiver     Prior Function Level of Independence: Needs assistance   Gait / Transfers Assistance Needed: Pt reports using RW for ambulation   ADL's / Homemaking Assistance Needed: Reports caregiver has been assisting with sponge bathing.  Hand Dominance        Extremity/Trunk Assessment   Upper Extremity Assessment Upper Extremity Assessment: Generalized weakness    Lower Extremity Assessment Lower Extremity Assessment: Generalized weakness    Cervical / Trunk  Assessment Cervical / Trunk Assessment: Kyphotic  Communication   Communication: HOH  Cognition Arousal/Alertness: Awake/alert Behavior During Therapy: WFL for tasks assessed/performed Overall Cognitive Status: Within Functional Limits for tasks assessed                                        General Comments General comments (skin integrity, edema, etc.): Pt very adamant about returning home with wife.     Exercises     Assessment/Plan    PT Assessment Patient needs continued PT services  PT Problem List Decreased strength;Decreased activity tolerance;Decreased balance;Decreased mobility;Decreased knowledge of use of DME;Decreased knowledge of precautions;Cardiopulmonary status limiting activity       PT Treatment Interventions DME instruction;Gait training;Functional mobility training;Patient/family education;Therapeutic activities;Therapeutic exercise;Stair training;Balance training    PT Goals (Current goals can be found in the Care Plan section)  Acute Rehab PT Goals Patient Stated Goal: to go home today PT Goal Formulation: With patient Time For Goal Achievement: 06/19/19 Potential to Achieve Goals: Good    Frequency Min 3X/week   Barriers to discharge        Co-evaluation               AM-PAC PT "6 Clicks" Mobility  Outcome Measure Help needed turning from your back to your side while in a flat bed without using bedrails?: None Help needed moving from lying on your back to sitting on the side of a flat bed without using bedrails?: A Little Help needed moving to and from a bed to a chair (including a wheelchair)?: A Little Help needed standing up from a chair using your arms (e.g., wheelchair or bedside chair)?: A Little Help needed to walk in hospital room?: A Lot Help needed climbing 3-5 steps with a railing? : A Lot 6 Click Score: 17    End of Session Equipment Utilized During Treatment: Gait belt;Oxygen Activity Tolerance: Patient  limited by fatigue Patient left: in chair;with call bell/phone within reach Nurse Communication: Mobility status PT Visit Diagnosis: Unsteadiness on feet (R26.81);Muscle weakness (generalized) (M62.81)    Time: 1346-1410 PT Time Calculation (min) (ACUTE ONLY): 24 min   Charges:   PT Evaluation $PT Eval Moderate Complexity: 1 Mod PT Treatments $Gait Training: 8-22 mins        Leighton Ruff, PT, DPT  Acute Rehabilitation Services  Pager: [phone] Office: [phone]   Rudean Hitt 06/05/2019, 3:28 PM

## 2019-06-05 NOTE — Progress Notes (Signed)
[place] for Heparin (Apixaban on hold) Indication: atrial fibrillation  No Known Allergies  Patient Measurements: Height: 5' 5.75" (167 cm) Weight: 127 lb 13.9 oz (58 kg) IBW/kg (Calculated) : 63.23  Vital Signs: Temp: 98 F (36.7 C) (06/24 0752) Temp Source: Oral (06/24 0013) BP: 151/52 (06/24 0752) Pulse Rate: 79 (06/24 0752)  Labs: Recent Labs    06/02/19 1705 06/02/19 2327 06/03/19 0237 06/03/19 0707 06/03/19 0836 06/03/19 1500  06/04/19 0427 06/04/19 1613 06/05/19 0427  HGB 8.5*  --  9.3* 9.0*  --   --   --  9.2*  --  8.9*  HCT 27.8*  --  30.3* 29.3*  --   --   --  29.2*  --  28.5*  PLT 497*  --  436* 411*  --   --   --  404*  --  352  APTT  --   --   --   --  51*  --    < > 63* 85* 77*  HEPARINUNFRC  --   --   --   --  1.88*  --   --  1.26*  --  0.99*  CREATININE 1.84*  --  1.70*  --   --   --   --  1.81*  --   --   TROPONINI 0.04* 0.04* 0.04*  --   --  0.04*  --   --   --   --    < > = values in this interval not displayed.    Estimated Creatinine Clearance: 20.9 mL/min (A) (by C-G formula based on SCr of 1.81 mg/dL (H)).  Assessment: 85 y/o M presents to the ED with 2 day history of worsening shortness of breath on and holding apixaban during acute illness. Heparin level is elevated at 0.99 as expected due to influence of apixaban.  -aPTT up to 85 and then 77 this AM,   Goal of Therapy:  Heparin level 0.3-0.7 units/ml aPTT 66-102 seconds Monitor platelets by anticoagulation protocol: Yes   Plan:  Continue heparin drip at 1150 units/hr Daily aPTT, heparin level and CBC  Larico Dimock A. Levada Dy, PharmD, [place] Pager: [phone] Please utilize Amion for appropriate phone number to reach the unit pharmacist ([place])

## 2019-06-05 NOTE — ED Provider Notes (Signed)
[facility] EMERGENCY DEPARTMENT Provider Note   CSN: [id number] Arrival date & time: 06/05/19  1953    History   Chief Complaint Chief Complaint  Patient presents with  . Shortness of Breath  . Fall    HPI Andre [name] is a 85 y.o. male with a PMH of CHF, atrial fibrillation on Amiodarone and Eliquis, complete heart block s/p pacemaker, respiratory failure with 4-5L on oxygen, and sepsis presenting with constant shortness of breath onset at 3pm today. Patient arrived via EMS from home. Home health aid reports patient was requiring 8L of oxygen and EMS provided 6L of oxygen. Patient reports shortness of breath is worse with exertion. Patient reports he had an episode of bilateral leg weakness at 5pm while going to the bathroom. Patient denies falling, LOC, or hitting head. Patient states he was caught by his caregiver. Patient reports mild bilateral leg edema. Patient denies leg pain. Patient denies chest pain. Patient reports intermittent chronic dry cough. Patient denies nausea, vomiting, abdominal pain, chills, fever, or congestion. Patient denies sick exposures or recent travel. His cardiologist is Dr. Caryl Comes. Patient states he had fluid removed from his right lung on Monday. Patient states he has been compliant with his medications. Last echo was in 02/2019 and it reveals LVEF 60-65%. Patient denies current tobacco or drug use.     HPI  Past Medical History:  Diagnosis Date  . Acute on chronic respiratory failure with hypoxia ([place])   . Adenomatous colon polyp 04/1985   no further colonoscopy, 2008-last colonoscopy, no polyps    . Atrial fibrillation ([place])   . Chronic atrial fibrillation   . Chronic diastolic heart failure ([place])   . Diverticulosis   . History of skin cancer    dermatology every 6 months Dr. [name]  . Hyperlipidemia   . Hypertension   . Lobar pneumonia, unspecified organism ([place])   . Macular degeneration    bilateral  .  Nephrolithiasis   . PAF (paroxysmal atrial fibrillation) ([place])   . PAT (paroxysmal atrial tachycardia) (H[facility])    many years ago, worse with smoking  . Severe sepsis [facility])     Patient Active Problem List   Diagnosis Date Noted  . Pleural effusion on right 06/03/2019  . Acute respiratory failure with hypoxia ([place]) 06/02/2019  . CKD (chronic kidney disease) stage 3, GFR 30-59 ml/min (HCC) 06/02/2019  . Hypokalemia   . Acute exacerbation of CHF (congestive heart failure) ([place]) 05/15/2019  . Hypoalbuminemia 05/15/2019  . Acute on chronic heart failure ([place]) 05/15/2019  . Acute on chronic diastolic CHF (congestive heart failure) ([place]) 05/15/2019  . Acute on chronic diastolic CHF (congestive heart failure) ([place]) 05/15/2019  . Acute on chronic respiratory failure with hypoxia ([place])   . Severe sepsis ([place])   . Chronic diastolic heart failure ([place])   . Lobar pneumonia, unspecified organism ([place])   . Chronic atrial fibrillation   . Pacemaker 03/12/2019  . Hypoxia 03/12/2019  . Pressure injury of skin 02/27/2019  . CHB (complete heart block) ([place]) 02/23/2019  . Peripheral vascular disease ([place]) 04/21/2016  . Atrial fibrillation/flutter 03/17/2016  . Acute on chronic diastolic heart failure ([place]) 01/05/2016  . Macular degeneration 02/17/2015  . Acute-on-chronic kidney disease stage III 02/17/2015  . Former smoker 02/17/2015  . History of skin cancer   . Malignant neoplasm of prostate ([place]) 01/05/2010  . Hyperlipidemia 12/04/2007  . Essential hypertension 12/04/2007  . Angiodysplasia of intestine with hemorrhage 05/15/2007    Past  Surgical History:  Procedure Laterality Date  . CARDIOVERSION N/A 05/23/2016   Procedure: CARDIOVERSION;  Surgeon: [name], MD;  Location: [facility] ENDOSCOPY;  Service: Cardiovascular;  Laterality: N/A;  . CARDIOVERSION N/A 03/01/2019   Procedure: CARDIOVERSION;  Surgeon: Lelon Perla, MD;  Location: [facility] ENDOSCOPY;  Service: Cardiovascular;  Laterality:  N/A;  . CATARACT EXTRACTION     bilateral  . INGUINAL HERNIA REPAIR    . PACEMAKER IMPLANT N/A 02/24/2019   Procedure: PACEMAKER IMPLANT;  Surgeon: Deboraha Sprang, MD;  Location: [facility] CV LAB;  Service: Cardiovascular;  Laterality: N/A;  . TEE WITHOUT CARDIOVERSION N/A 03/22/2016   Procedure: TRANSESOPHAGEAL ECHOCARDIOGRAM (TEE);  Surgeon: [name], MD;  Location: [facility];  Service: Cardiovascular;  Laterality: N/A;  . TEE WITHOUT CARDIOVERSION N/A 05/23/2016   Procedure: TRANSESOPHAGEAL ECHOCARDIOGRAM (TEE);  Surgeon: [name], MD;  Location: [facility] ENDOSCOPY;  Service: Cardiovascular;  Laterality: N/A;  . TEE WITHOUT CARDIOVERSION N/A 03/01/2019   Procedure: TRANSESOPHAGEAL ECHOCARDIOGRAM (TEE);  Surgeon: Lelon Perla, MD;  Location: [facility] ENDOSCOPY;  Service: Cardiovascular;  Laterality: N/A;  . TENDON REPAIR  12/11   right leg        Home Medications    Prior to Admission medications   Medication Sig Start Date End Date Taking? Authorizing Provider  albuterol (VENTOLIN HFA) 108 (90 Base) MCG/ACT inhaler Inhale 1 puff into the lungs every 4 (four) hours as needed for wheezing or shortness of breath. 05/21/19   Shelly Coss, MD  amiodarone (PACERONE) 200 MG tablet Take 1 tablet (200 mg total) by mouth daily. 05/24/19   Lelon Perla, MD  amLODipine (NORVASC) 5 MG tablet Take 1 tablet (5 mg total) by mouth daily. 05/27/19   [name], MD  apixaban (ELIQUIS) 2.5 MG TABS tablet Take 1 tablet (2.5 mg total) by mouth 2 (two) times daily. Patient taking differently: Take 2.5 mg by mouth at bedtime.  05/27/19   [name], MD  atorvastatin (LIPITOR) 10 MG tablet Take 1 tablet (10 mg total) by mouth daily. 05/27/19   [name], MD  furosemide (LASIX) 40 MG tablet Take 1 tablet (40 mg total) by mouth 2 (two) times daily. Patient taking differently: Take 20 mg by mouth 2 (two) times daily.  05/31/19 05/25/20  Deboraha Sprang, MD   losartan-hydrochlorothiazide (HYZAAR) 100-25 MG tablet Take 1 tablet by mouth daily. 05/27/19   [name], MD  Potassium Chloride ER 20 MEQ TBCR Take 20 mEq by mouth daily as needed (as long as taking lasix). Patient taking differently: Take 20 mEq by mouth daily.  05/27/19   [name], MD    Family History Family History  Problem Relation Age of Onset  . Hypertension Mother   . Cancer Father        lung    Social History Social History   Tobacco Use  . Smoking status: Former Smoker    Packs/day: 1.00    Years: 60.00    Pack years: 60.00    Types: Cigarettes    Quit date: 12/13/2003    Years since quitting: 15.4  . Smokeless tobacco: Never Used  Substance Use Topics  . Alcohol use: Yes    Alcohol/week: 0.0 standard drinks    Comment: 2 glass of wine a day  . Drug use: No     Allergies   Patient has no known allergies.   Review of Systems Review of Systems  Constitutional: Negative for chills, diaphoresis, fatigue,  fever and unexpected weight change.  HENT: Negative for congestion, rhinorrhea, sore throat and trouble swallowing.   Eyes: Negative for visual disturbance.  Respiratory: Positive for cough and shortness of breath. Negative for chest tightness, wheezing and stridor.   Cardiovascular: Positive for leg swelling. Negative for chest pain and palpitations.  Gastrointestinal: Negative for abdominal pain, blood in stool, diarrhea, nausea and vomiting.  Endocrine: Negative for cold intolerance and heat intolerance.  Skin: Negative for pallor and rash.  Allergic/Immunologic: Negative for environmental allergies and food allergies.  Neurological: Positive for weakness. Negative for dizziness, syncope, speech difficulty, light-headedness and numbness.  Psychiatric/Behavioral: The patient is not nervous/anxious.     Physical Exam Updated Vital Signs BP (!) 112/44   Pulse 71   Temp 97.6 F (36.4 C) (Oral)   Resp 20   SpO2 94%   Physical Exam  Vitals signs and nursing note reviewed.  Constitutional:      General: He is not in acute distress.    Appearance: He is well-developed. He is not diaphoretic.  HENT:     Head: Normocephalic and atraumatic.     Mouth/Throat:     Mouth: Mucous membranes are dry.     Pharynx: Uvula midline. No pharyngeal swelling, oropharyngeal exudate or posterior oropharyngeal erythema.  Neck:     Musculoskeletal: Normal range of motion.  Cardiovascular:     Rate and Rhythm: Normal rate and regular rhythm.     Heart sounds: Normal heart sounds. No murmur. No friction rub. No gallop.   Pulmonary:     Effort: Pulmonary effort is normal. No accessory muscle usage or respiratory distress.     Breath sounds: Normal breath sounds. No wheezing, rhonchi or rales.     Comments: Patient is speaking in full sentences on 4L of oxygen.  Abdominal:     Palpations: Abdomen is soft.     Tenderness: There is no abdominal tenderness.  Musculoskeletal: Normal range of motion.     Right lower leg: No edema.     Left lower leg: No edema.  Skin:    General: Skin is warm.     Findings: No erythema or rash.  Neurological:     Mental Status: He is alert.    Mental Status:  Alert, oriented, thought content appropriate, able to give a coherent history. Speech fluent without evidence of aphasia. Able to follow 2 step commands without difficulty.  Cranial Nerves:  II:  Peripheral visual fields grossly normal, pupils equal, round, reactive to light III,IV, VI: ptosis not present, extra-ocular motions intact bilaterally  V,VII: smile symmetric, facial light touch sensation equal VIII: hearing grossly normal to voice  IX,X: symmetric elevation of soft palate, uvula elevates symmetrically  XI: bilateral shoulder shrug symmetric and strong XII: midline tongue extension without fassiculations Motor:  Normal tone. 5/5 in upper and lower extremities bilaterally including strong and equal grip strength and dorsiflexion/plantar  flexion Sensory: Pinprick and light touch normal in all extremities.  Deep Tendon Reflexes: 2+ and symmetric in the biceps and patella Cerebellar: normal finger-to-nose with bilateral upper extremities CV: distal pulses palpable throughout     ED Treatments / Results  Labs (all labs ordered are listed, but only abnormal results are displayed) Labs Reviewed  BASIC METABOLIC PANEL - Abnormal; Notable for the following components:      Result Value   Sodium 133 (*)    Chloride 95 (*)    Glucose, Bld 142 (*)    BUN 39 (*)    Creatinine,  Ser 2.14 (*)    Calcium 8.4 (*)    GFR calc non Af Amer 26 (*)    GFR calc Af Amer 30 (*)    All other components within normal limits  CBC WITH DIFFERENTIAL/PLATELET - Abnormal; Notable for the following components:   WBC 14.0 (*)    RBC 3.29 (*)    Hemoglobin 8.6 (*)    HCT 27.6 (*)    RDW 16.2 (*)    Neutro Abs 11.7 (*)    Abs Immature Granulocytes 0.09 (*)    All other components within normal limits  BRAIN NATRIURETIC PEPTIDE - Abnormal; Notable for the following components:   B Natriuretic Peptide 264.6 (*)    All other components within normal limits  CULTURE, BLOOD (ROUTINE X 2)  CULTURE, BLOOD (ROUTINE X 2)  SARS CORONAVIRUS 2 (HOSPITAL ORDER, [place])  LACTIC ACID, PLASMA  TROPONIN I (HIGH SENSITIVITY)  TROPONIN I (HIGH SENSITIVITY)  URINALYSIS, ROUTINE W REFLEX MICROSCOPIC  LACTIC ACID, PLASMA    EKG EKG Interpretation  Date/Time:  Wednesday June 05 2019 19:55:23 EDT Ventricular Rate:  69 PR Interval:  184 QRS Duration: 186 QT Interval:  530 QTC Calculation: 567 R Axis:   -73 Text Interpretation:  Atrial-sensed ventricular-paced rhythm with occasional Premature ventricular complexes Abnormal ECG Confirmed by Julianne Rice [phone]) on 06/05/2019 10:58:53 PM   Radiology Dg Chest 2 View  Result Date: 06/05/2019 CLINICAL DATA:  85 year old male with shortness of breath. EXAM: CHEST - 2 VIEW  COMPARISON:  Chest CT dated 06/03/2019 FINDINGS: There is emphysema and chronic interstitial coarsening. Moderate right pleural effusion and associated atelectatic changes. No pneumothorax. The cardiac silhouette is within normal limits. Left pectoral pacemaker device. Osteopenia with degenerative changes of the spine. No acute osseous pathology. IMPRESSION: 1. Moderate right pleural effusion and associated atelectatic changes similar to the prior CT. 2. Emphysema. Electronically Signed   By: Anner Crete M.D.   On: 06/05/2019 21:22    Procedures Procedures (including critical care time)  Medications Ordered in ED Medications  sodium chloride 0.9 % bolus 500 mL (500 mLs Intravenous New Bag/Given 06/05/19 2135)     Initial Impression / Assessment and Plan / ED Course  I have reviewed the triage vital signs and the nursing notes.  Pertinent labs & imaging results that were available during my care of the patient were reviewed by me and considered in my medical decision making (see chart for details).  Clinical Course as of Jun 05 5  Wed Jun 05, 2019  2126 1. Moderate right pleural effusion and associated atelectatic changes similar to the prior CT. 2. Emphysema.    DG Chest 2 View [AH]  2214 Leukocytosis noted at 14. This appears to be increasing compared to previous values.  WBC(!): 14.0 [AH]  2235 BNP elevated at 264. This appears to be slightly better than previous values.  B Natriuretic Peptide(!): 264.6 [AH]  2237 Creatinine elevated at 2.14. This appears to have increased compared to previous values.  Creatinine(!): 2.14 [AH]    Clinical Course User Index [AH] Arville Lime, PA-C      Patient presents with shortness of breath and leg weakness. Patient was hypotensive at arrival and gentle IVF were provided. Blood pressure has improved while in the ER. Vitals, labs, and imaging reviewed. CXR reveals worsening leukocytosis. CXR reveals right pleural effusion.  Creatinine elevated compared to previous values. BNP elevated, but appears to be improved compared to previous values. EKG reveals  paced rhythm. Patient has not had chest pain while in the ER. Patient is stable with 4L of oxygen. Patient is in no acute distress at this time. Consulted hospitalist for admission. Hospitalist has agreed to admit patient.   Findings and plan of care discussed with supervising physician Dr. Lita Mains.  Final Clinical Impressions(s) / ED Diagnoses   Final diagnoses:  Shortness of breath  Acute on chronic systolic congestive heart failure [facility])  Weakness    ED Discharge Orders    None       Arville Lime, [place] 06/06/19 0010    Julianne Rice, MD 06/14/19 1249

## 2019-06-05 NOTE — H&P (Addendum)
History and Physical    Andre [name] XNT:[id number] DOB: May 08, 1923 DOA: 06/05/2019  Referring MD/NP/PA: Darlin Drop, PA-C PCP: [name], MD  Patient coming from: Home via EMS  Chief Complaint: Shortness of breath I have personally briefly reviewed patient's old medical records in [place]   HPI: Andre [name] is a 85 y.o. male with medical history significant of hypertension, diastolic CHF last EF 60 -96% in 02/2019, atrial fibrillation on Eliquis, complete heart block status post pacemaker, and chronic respiratory failure on 5 L nasal cannula oxygen; who presents with complaints of shortness of breath.  Patient had just been discharged from the hospital today for acute on chronic respiratory failure secondary to congestive heart failure exacerbation with right-sided pleural effusion.  During his hospital stay he was evaluated by PCCM who performed thoracentesis taking off 1670 ml of bloody fluid on 6/22.  The fluid was exudative with reactive mesothelial cells.  After getting home, he reported getting up to walk around and feeling very short of breath with any exertion.  He was going to use the restroom while using his walker when he reports that his legs gave out.  He states that he would have fallen at the nursing aide had not been present.  Associated times included intermittent chronic dry cough, lower extremities swelling, easy bruisability.  Denies having any lightheadedness, nausea, vomiting, abdominal pain, loss of consciousness, or trauma to his head.  ED Course: Upon admission into the emergency department patient was noted to be afebrile, blood pressure 91/48 with improved to 151/52 with 500 ml of IV fluids, and O2 saturations 92-100% with 4 L nasal cannula oxygen.  Labs revealed WBC 14, hemoglobin 8.6, sodium 133 BUN 39, creatinine 2.14(previously 1.81 this a.m), and lactic acid 1.8.  Chest x-ray showing moderate right-sided pleural effusion.  TRH called to  admit.  Accepted to a progressive bed  Review of Systems  Constitutional: Negative for chills and fever.  HENT: Negative for ear pain.   Eyes: Negative for photophobia and pain.  Respiratory: Positive for cough and shortness of breath.   Gastrointestinal: Negative for abdominal pain, nausea and vomiting.  Genitourinary: Negative for dysuria and frequency.  Musculoskeletal: Positive for joint pain. Negative for falls.  Skin: Negative for itching.  Neurological: Positive for weakness. Negative for loss of consciousness.  Endo/Heme/Allergies: Bruises/bleeds easily.  Psychiatric/Behavioral: Negative for memory loss and substance abuse.    Past Medical History:  Diagnosis Date  . Acute on chronic respiratory failure with hypoxia ([place])   . Adenomatous colon polyp 04/1985   no further colonoscopy, 2008-last colonoscopy, no polyps    . Atrial fibrillation ([place])   . Chronic atrial fibrillation   . Chronic diastolic heart failure ([place])   . Diverticulosis   . History of skin cancer    dermatology every 6 months Dr. [name]  . Hyperlipidemia   . Hypertension   . Lobar pneumonia, unspecified organism ([place])   . Macular degeneration    bilateral  . Nephrolithiasis   . PAF (paroxysmal atrial fibrillation) ([place])   . PAT (paroxysmal atrial tachycardia) ([facility])    many years ago, worse with smoking  . Severe sepsis [facility])     Past Surgical History:  Procedure Laterality Date  . CARDIOVERSION N/A 05/23/2016   Procedure: CARDIOVERSION;  Surgeon: [name], MD;  Location: [facility] ENDOSCOPY;  Service: Cardiovascular;  Laterality: N/A;  . CARDIOVERSION N/A 03/01/2019   Procedure: CARDIOVERSION;  Surgeon: Lelon Perla, MD;  Location: [facility]  ENDOSCOPY;  Service: Cardiovascular;  Laterality: N/A;  . CATARACT EXTRACTION     bilateral  . INGUINAL HERNIA REPAIR    . PACEMAKER IMPLANT N/A 02/24/2019   Procedure: PACEMAKER IMPLANT;  Surgeon: Deboraha Sprang, MD;  Location: [place] CV LAB;  Service:  Cardiovascular;  Laterality: N/A;  . TEE WITHOUT CARDIOVERSION N/A 03/22/2016   Procedure: TRANSESOPHAGEAL ECHOCARDIOGRAM (TEE);  Surgeon: [name], MD;  Location: [place];  Service: Cardiovascular;  Laterality: N/A;  . TEE WITHOUT CARDIOVERSION N/A 05/23/2016   Procedure: TRANSESOPHAGEAL ECHOCARDIOGRAM (TEE);  Surgeon: [name], MD;  Location: [facility] ENDOSCOPY;  Service: Cardiovascular;  Laterality: N/A;  . TEE WITHOUT CARDIOVERSION N/A 03/01/2019   Procedure: TRANSESOPHAGEAL ECHOCARDIOGRAM (TEE);  Surgeon: Lelon Perla, MD;  Location: [facility] ENDOSCOPY;  Service: Cardiovascular;  Laterality: N/A;  . TENDON REPAIR  12/11   right leg     reports that he quit smoking about 15 years ago. His smoking use included cigarettes. He has a 60.00 pack-year smoking history. He has never used smokeless tobacco. He reports current alcohol use. He reports that he does not use drugs.  No Known Allergies  Family History  Problem Relation Age of Onset  . Hypertension Mother   . Cancer Father        lung    Prior to Admission medications   Medication Sig Start Date End Date Taking? Authorizing Provider  albuterol (VENTOLIN HFA) 108 (90 Base) MCG/ACT inhaler Inhale 1 puff into the lungs every 4 (four) hours as needed for wheezing or shortness of breath. 05/21/19   Shelly Coss, MD  amiodarone (PACERONE) 200 MG tablet Take 1 tablet (200 mg total) by mouth daily. 05/24/19   Lelon Perla, MD  amLODipine (NORVASC) 5 MG tablet Take 1 tablet (5 mg total) by mouth daily. 05/27/19   [name], MD  apixaban (ELIQUIS) 2.5 MG TABS tablet Take 1 tablet (2.5 mg total) by mouth 2 (two) times daily. Patient taking differently: Take 2.5 mg by mouth at bedtime.  05/27/19   [name], MD  atorvastatin (LIPITOR) 10 MG tablet Take 1 tablet (10 mg total) by mouth daily. 05/27/19   [name], MD  furosemide (LASIX) 40 MG tablet Take 1 tablet (40 mg total) by mouth 2 (two) times daily.  Patient taking differently: Take 20 mg by mouth 2 (two) times daily.  05/31/19 05/25/20  Deboraha Sprang, MD  losartan-hydrochlorothiazide (HYZAAR) 100-25 MG tablet Take 1 tablet by mouth daily. 05/27/19   [name], MD  Potassium Chloride ER 20 MEQ TBCR Take 20 mEq by mouth daily as needed (as long as taking lasix). Patient taking differently: Take 20 mEq by mouth daily.  05/27/19   [name], MD    Physical Exam:  Constitutional: Elderly male in NAD, calm, comfortable Vitals:   06/05/19 1958 06/05/19 2030 06/05/19 2100  BP: (!) 91/48 (!) 106/43 (!) 112/44  Pulse: 70 66 71  Resp: (!) 27 (!) 24 20  Temp: 97.6 F (36.4 C)    TempSrc: Oral    SpO2: 100% 99% 94%   Eyes: PERRL, lids and conjunctivae normal ENMT: Mucous membranes are moist. Posterior pharynx clear of any exudate or lesions.   Neck: normal, supple, no masses, no thyromegaly Respiratory: Poor overall air movement with breath sounds significantly decreased on the lower right lung field.  Currently able to talk in complete sentences on 4 L nasal cannula oxygen. Cardiovascular: Regular rate and rhythm, no murmurs /  rubs / gallops.  Trace lower extremity edema. 2+ pedal pulses. No carotid bruits.  Abdomen: no tenderness, no masses palpated. No hepatosplenomegaly. Bowel sounds positive.  Musculoskeletal: no clubbing / cyanosis. No joint deformity upper and lower extremities. Good ROM, no contractures. Normal muscle tone.  Skin: Bruising noted of the upper and lower extremities. Neurologic: CN 2-12 grossly intact. Sensation intact, DTR normal. Strength 5/5 in all 4.  Psychiatric: Normal judgment and insight. Alert and oriented x 3. Normal mood.     Labs on Admission: I have personally reviewed following labs and imaging studies  CBC: Recent Labs  Lab 06/02/19 1705 06/03/19 0237 06/03/19 0707 06/04/19 0427 06/05/19 0427 06/05/19 2128  WBC 14.1* 13.2* 12.6* 12.9* 12.0* 14.0*  NEUTROABS 11.1* 9.4*  --  8.4*   --  11.7*  HGB 8.5* 9.3* 9.0* 9.2* 8.9* 8.6*  HCT 27.8* 30.3* 29.3* 29.2* 28.5* 27.6*  MCV 86.6 84.6 86.4 83.4 82.1 83.9  PLT 497* 436* 411* 404* 352 315   Basic Metabolic Panel: Recent Labs  Lab 06/02/19 1705 06/02/19 2327 06/03/19 0237 06/04/19 0427 06/05/19 2128  NA 135  --  137 134* 133*  K 4.8  --  3.7 3.4* 3.8  CL 101  --  102 96* 95*  CO2 24  --  25 27 26   GLUCOSE 114*  --  118* 99 142*  BUN 29*  --  28* 32* 39*  CREATININE 1.84*  --  1.70* 1.81* 2.14*  CALCIUM 8.7*  --  8.6* 8.5* 8.4*  MG  --  2.0  --   --   --   PHOS  --   --   --  4.8*  --    GFR: Estimated Creatinine Clearance: 17.7 mL/min (A) (by C-G formula based on SCr of 2.14 mg/dL (H)). Liver Function Tests: Recent Labs  Lab 06/02/19 1705 06/03/19 0237 06/04/19 0427  AST 33 11*  --   ALT 18 14  --   ALKPHOS 63 62  --   BILITOT 1.1 0.3  --   PROT 6.3* 6.2*  --   ALBUMIN 2.3* 2.2* 2.0*   No results for input(s): LIPASE, AMYLASE in the last 168 hours. No results for input(s): AMMONIA in the last 168 hours. Coagulation Profile: No results for input(s): INR, PROTIME in the last 168 hours. Cardiac Enzymes: Recent Labs  Lab 06/02/19 1705 06/02/19 2327 06/03/19 0237 06/03/19 1500  TROPONINI 0.04* 0.04* 0.04* 0.04*   BNP (last 3 results) No results for input(s): PROBNP in the last 8760 hours. HbA1C: No results for input(s): HGBA1C in the last 72 hours. CBG: No results for input(s): GLUCAP in the last 168 hours. Lipid Profile: No results for input(s): CHOL, HDL, LDLCALC, TRIG, CHOLHDL, LDLDIRECT in the last 72 hours. Thyroid Function Tests: No results for input(s): TSH, T4TOTAL, FREET4, T3FREE, THYROIDAB in the last 72 hours. Anemia Panel: No results for input(s): VITAMINB12, FOLATE, FERRITIN, TIBC, IRON, RETICCTPCT in the last 72 hours. Urine analysis:    Component Value Date/Time   COLORURINE STRAW (A) 05/15/2019 2150   APPEARANCEUR HAZY (A) 05/15/2019 2150   LABSPEC 1.005 05/15/2019 2150    PHURINE 6.0 05/15/2019 2150   GLUCOSEU NEGATIVE 05/15/2019 2150   HGBUR NEGATIVE 05/15/2019 2150   BILIRUBINUR NEGATIVE 05/15/2019 2150   BILIRUBINUR Negative 12/11/2018 0947   KETONESUR NEGATIVE 05/15/2019 2150   PROTEINUR NEGATIVE 05/15/2019 2150   UROBILINOGEN 0.2 12/11/2018 0947   UROBILINOGEN 0.2 12/08/2010 0639   NITRITE NEGATIVE 05/15/2019 2150   LEUKOCYTESUR  MODERATE (A) 05/15/2019 2150   Sepsis Labs: Recent Results (from the past 240 hour(s))  SARS Coronavirus 2 (CEPHEID- Performed in [facility] hospital lab), Hosp Order     Status: None   Collection Time: 06/02/19  5:05 PM   Specimen: Nasopharyngeal Swab  Result Value Ref Range Status   SARS Coronavirus 2 NEGATIVE NEGATIVE Final    Comment: (NOTE) If result is NEGATIVE SARS-CoV-2 target nucleic acids are NOT DETECTED. The SARS-CoV-2 RNA is generally detectable in upper and lower  respiratory specimens during the acute phase of infection. The lowest  concentration of SARS-CoV-2 viral copies this assay can detect is 250  copies / mL. A negative result does not preclude SARS-CoV-2 infection  and should not be used as the sole basis for treatment or other  patient management decisions.  A negative result may occur with  improper specimen collection / handling, submission of specimen other  than nasopharyngeal swab, presence of viral mutation(s) within the  areas targeted by this assay, and inadequate number of viral copies  (<250 copies / mL). A negative result must be combined with clinical  observations, patient history, and epidemiological information. If result is POSITIVE SARS-CoV-2 target nucleic acids are DETECTED. The SARS-CoV-2 RNA is generally detectable in upper and lower  respiratory specimens dur ing the acute phase of infection.  Positive  results are indicative of active infection with SARS-CoV-2.  Clinical  correlation with patient history and other diagnostic information is  necessary to determine  patient infection status.  Positive results do  not rule out bacterial infection or co-infection with other viruses. If result is PRESUMPTIVE POSTIVE SARS-CoV-2 nucleic acids MAY BE PRESENT.   A presumptive positive result was obtained on the submitted specimen  and confirmed on repeat testing.  While 2019 novel coronavirus  (SARS-CoV-2) nucleic acids may be present in the submitted sample  additional confirmatory testing may be necessary for epidemiological  and / or clinical management purposes  to differentiate between  SARS-CoV-2 and other Sarbecovirus currently known to infect humans.  If clinically indicated additional testing with an alternate test  methodology [phone]) is advised. The SARS-CoV-2 RNA is generally  detectable in upper and lower respiratory sp ecimens during the acute  phase of infection. The expected result is Negative. Fact Sheet for Patients:  StrictlyIdeas.no Fact Sheet for Healthcare Providers: BankingDealers.co.za This test is not yet approved or cleared by the [place] FDA and has been authorized for detection and/or diagnosis of SARS-CoV-2 by FDA under an Emergency Use Authorization (EUA).  This EUA will remain in effect (meaning this test can be used) for the duration of the COVID-19 declaration under Section 564(b)(1) of the Act, 21 U.S.C. section 360bbb-3(b)(1), unless the authorization is terminated or revoked sooner. Performed at [facility], [place] [address] [place], [place] [address]   Body fluid culture (includes gram stain)     Status: None (Preliminary result)   Collection Time: 06/03/19  6:03 AM   Specimen: Pleural Fluid  Result Value Ref Range Status   Specimen Description PLEURAL RIGHT  Final   Special Requests NONE  Final   Gram Stain   Final    MODERATE WBC PRESENT,BOTH PMN AND MONONUCLEAR NO ORGANISMS SEEN    Culture   Final    NO GROWTH 2 DAYS Performed at [facility], [place] [address], [place], [place] [address]    Report Status PENDING  Incomplete  MRSA PCR Screening     Status: None  Collection Time: 06/03/19  4:34 PM   Specimen: Nasopharyngeal  Result Value Ref Range Status   MRSA by PCR NEGATIVE NEGATIVE Final    Comment:        The GeneXpert MRSA Assay (FDA approved for NASAL specimens only), is one component of a comprehensive MRSA colonization surveillance program. It is not intended to diagnose MRSA infection nor to guide or monitor treatment for MRSA infections. Performed at [facility], [place] [address], [place], [place] [address]      Radiological Exams on Admission: Dg Chest 2 View  Result Date: 06/05/2019 CLINICAL DATA:  85 year old male with shortness of breath. EXAM: CHEST - 2 VIEW COMPARISON:  Chest CT dated 06/03/2019 FINDINGS: There is emphysema and chronic interstitial coarsening. Moderate right pleural effusion and associated atelectatic changes. No pneumothorax. The cardiac silhouette is within normal limits. Left pectoral pacemaker device. Osteopenia with degenerative changes of the spine. No acute osseous pathology. IMPRESSION: 1. Moderate right pleural effusion and associated atelectatic changes similar to the prior CT. 2. Emphysema. Electronically Signed   By: Anner Crete M.D.   On: 06/05/2019 21:22    EKG: Independently reviewed.  Ventricularly paced rhythm at 69 bpm..  Assessment/Plan Acute on chronic respiratory failure with hypoxia, emphysema, recurrent right-sided pleural effusion: Patient presents with complaints of worsening shortness of breath.  Chest x-ray shows moderate right-sided pleural effusion with associated atelectatic changes.  Patient maintaining O2 saturations on 4 L currently.  Last thoracentesis performed by PCCM 2 days ago with 1670 milliliters of bloody pleural fluid removed.  Fluid was exudative and cytology revealed mesothelial cells. -Admit to a progressive bed -Continuous pulse  oximetry with nasal cannula oxygen as needed -Discussed over the phone with PCCM physician in [place] who suggested IR -Consulted IR in a.m. for possible need of Pleurx catheter   Hypotension: Acute.  Patient's blood pressures noted to be initially as low as 91/48 on admission.  Patient was given 500 mL normal saline IV fluids with some improvement.  Maps currently greater than 65. -Hold antihypertensive agents -Continue to monitor and reassess fluid status  Acute kidney injury superimposed on chronic kidney disease stage 3: Creatinine 2.14 with BUN 31.  Previously creatinine at discharge had been around 1.7 -1.8.  Suspect possibly related with overdiuresis. -Recheck creatinine in a.m. -Consider additional IV fluids as needed  Leukocytosis: Acute on chronic.  WBC 14 which is higher than that discharge.  Suspect this is likely reactive in nature. -Continue to monitor  Weakness: Patient noted to have bilateral lower extremity weakness. -PT to eval and treat  Normocytic hypochromic anemia: Acute on chronic.  Hemoglobin 8.6 on admission which appears similar to previous. -Continue to monitor recheck CBC in a.m.  Atrial fibrillation on chronic anticoagulation, Complete Heart Block status post pacemaker: Patient currently rate controlled and had restarted on Eliquis prior to discharge. CHA2DS2-VASc score =at least 4.  -Continue amiodarone -Hold Eliquis for possible need of procedure -Heparin per pharmacy  Diastolic CHF: Echocardiogram from 02/2019 shows EF of 60 to 65%.  Previously discharged home 2 weeks ago on just 20 mg of Lasix, but during most recent discharge increase to 40 mg twice daily.  Hyponatremia: Sodium 133.  Suspect secondary to possible overdiuresis.  -Recheck sodium levels in a.m.  Hyperlipidemia -Continue atorvastatin  DVT prophylaxis: Heparin per pharmacy Code Status: full Family Communication: No family present at bedside Disposition Plan: TBD Consults called: none  Admission status:observation  Norval Morton MD [facility] Pager [phone]  If 7PM-7AM, please contact night-coverage [email or web address] Password [facility]  06/05/2019, 11:49 PM

## 2019-06-06 ENCOUNTER — Telehealth: Payer: Medicare Other | Admitting: Cardiology

## 2019-06-06 DIAGNOSIS — I482 Chronic atrial fibrillation, unspecified: Secondary | ICD-10-CM

## 2019-06-06 DIAGNOSIS — I5033 Acute on chronic diastolic (congestive) heart failure: Secondary | ICD-10-CM | POA: Diagnosis not present

## 2019-06-06 DIAGNOSIS — N170 Acute kidney failure with tubular necrosis: Secondary | ICD-10-CM | POA: Diagnosis not present

## 2019-06-06 DIAGNOSIS — Z515 Encounter for palliative care: Secondary | ICD-10-CM | POA: Diagnosis present

## 2019-06-06 DIAGNOSIS — N179 Acute kidney failure, unspecified: Secondary | ICD-10-CM | POA: Diagnosis present

## 2019-06-06 DIAGNOSIS — E871 Hypo-osmolality and hyponatremia: Secondary | ICD-10-CM | POA: Diagnosis present

## 2019-06-06 DIAGNOSIS — Z66 Do not resuscitate: Secondary | ICD-10-CM | POA: Diagnosis present

## 2019-06-06 DIAGNOSIS — I959 Hypotension, unspecified: Secondary | ICD-10-CM | POA: Diagnosis present

## 2019-06-06 DIAGNOSIS — I130 Hypertensive heart and chronic kidney disease with heart failure and stage 1 through stage 4 chronic kidney disease, or unspecified chronic kidney disease: Secondary | ICD-10-CM | POA: Diagnosis present

## 2019-06-06 DIAGNOSIS — I5043 Acute on chronic combined systolic (congestive) and diastolic (congestive) heart failure: Secondary | ICD-10-CM | POA: Diagnosis present

## 2019-06-06 DIAGNOSIS — Z1159 Encounter for screening for other viral diseases: Secondary | ICD-10-CM | POA: Diagnosis not present

## 2019-06-06 DIAGNOSIS — I5023 Acute on chronic systolic (congestive) heart failure: Secondary | ICD-10-CM | POA: Diagnosis not present

## 2019-06-06 DIAGNOSIS — N172 Acute kidney failure with medullary necrosis: Secondary | ICD-10-CM | POA: Diagnosis not present

## 2019-06-06 DIAGNOSIS — J90 Pleural effusion, not elsewhere classified: Secondary | ICD-10-CM | POA: Diagnosis present

## 2019-06-06 DIAGNOSIS — Z86010 Personal history of colonic polyps: Secondary | ICD-10-CM | POA: Diagnosis not present

## 2019-06-06 DIAGNOSIS — H3530 Unspecified macular degeneration: Secondary | ICD-10-CM | POA: Diagnosis present

## 2019-06-06 DIAGNOSIS — N183 Chronic kidney disease, stage 3 (moderate): Secondary | ICD-10-CM | POA: Diagnosis present

## 2019-06-06 DIAGNOSIS — E46 Unspecified protein-calorie malnutrition: Secondary | ICD-10-CM

## 2019-06-06 DIAGNOSIS — J849 Interstitial pulmonary disease, unspecified: Secondary | ICD-10-CM | POA: Diagnosis present

## 2019-06-06 DIAGNOSIS — D509 Iron deficiency anemia, unspecified: Secondary | ICD-10-CM | POA: Diagnosis present

## 2019-06-06 DIAGNOSIS — I9589 Other hypotension: Secondary | ICD-10-CM

## 2019-06-06 DIAGNOSIS — I4892 Unspecified atrial flutter: Secondary | ICD-10-CM | POA: Diagnosis present

## 2019-06-06 DIAGNOSIS — I442 Atrioventricular block, complete: Secondary | ICD-10-CM | POA: Diagnosis present

## 2019-06-06 DIAGNOSIS — E876 Hypokalemia: Secondary | ICD-10-CM | POA: Diagnosis present

## 2019-06-06 DIAGNOSIS — J918 Pleural effusion in other conditions classified elsewhere: Secondary | ICD-10-CM | POA: Diagnosis present

## 2019-06-06 DIAGNOSIS — J439 Emphysema, unspecified: Secondary | ICD-10-CM | POA: Diagnosis present

## 2019-06-06 DIAGNOSIS — E785 Hyperlipidemia, unspecified: Secondary | ICD-10-CM | POA: Diagnosis present

## 2019-06-06 DIAGNOSIS — I480 Paroxysmal atrial fibrillation: Secondary | ICD-10-CM | POA: Diagnosis present

## 2019-06-06 DIAGNOSIS — J9621 Acute and chronic respiratory failure with hypoxia: Secondary | ICD-10-CM | POA: Diagnosis present

## 2019-06-06 DIAGNOSIS — D72829 Elevated white blood cell count, unspecified: Secondary | ICD-10-CM | POA: Diagnosis present

## 2019-06-06 DIAGNOSIS — Z7189 Other specified counseling: Secondary | ICD-10-CM | POA: Diagnosis not present

## 2019-06-06 DIAGNOSIS — R0602 Shortness of breath: Secondary | ICD-10-CM | POA: Diagnosis not present

## 2019-06-06 DIAGNOSIS — Z85828 Personal history of other malignant neoplasm of skin: Secondary | ICD-10-CM | POA: Diagnosis not present

## 2019-06-06 LAB — URINALYSIS, ROUTINE W REFLEX MICROSCOPIC
Bilirubin Urine: NEGATIVE
Glucose, UA: NEGATIVE mg/dL
Hgb urine dipstick: NEGATIVE
Ketones, ur: NEGATIVE mg/dL
Leukocytes,Ua: NEGATIVE
Nitrite: NEGATIVE
Protein, ur: 30 mg/dL — AB
Specific Gravity, Urine: 1.009 (ref 1.005–1.030)
pH: 6 (ref 5.0–8.0)

## 2019-06-06 LAB — MAGNESIUM: Magnesium: 1.9 mg/dL (ref 1.7–2.4)

## 2019-06-06 LAB — CBC
HCT: 29.6 % — ABNORMAL LOW (ref 39.0–52.0)
Hemoglobin: 9.4 g/dL — ABNORMAL LOW (ref 13.0–17.0)
MCH: 26 pg (ref 26.0–34.0)
MCHC: 31.8 g/dL (ref 30.0–36.0)
MCV: 82 fL (ref 80.0–100.0)
Platelets: 350 10*3/uL (ref 150–400)
RBC: 3.61 MIL/uL — ABNORMAL LOW (ref 4.22–5.81)
RDW: 16.1 % — ABNORMAL HIGH (ref 11.5–15.5)
WBC: 12 10*3/uL — ABNORMAL HIGH (ref 4.0–10.5)
nRBC: 0 % (ref 0.0–0.2)

## 2019-06-06 LAB — BASIC METABOLIC PANEL
Anion gap: 11 (ref 5–15)
BUN: 36 mg/dL — ABNORMAL HIGH (ref 8–23)
CO2: 28 mmol/L (ref 22–32)
Calcium: 8.6 mg/dL — ABNORMAL LOW (ref 8.9–10.3)
Chloride: 96 mmol/L — ABNORMAL LOW (ref 98–111)
Creatinine, Ser: 2.01 mg/dL — ABNORMAL HIGH (ref 0.61–1.24)
GFR calc Af Amer: 32 mL/min — ABNORMAL LOW (ref >60)
GFR calc non Af Amer: 28 mL/min — ABNORMAL LOW (ref >60)
Glucose, Bld: 112 mg/dL — ABNORMAL HIGH (ref 70–99)
Potassium: 3.4 mmol/L — ABNORMAL LOW (ref 3.5–5.1)
Sodium: 135 mmol/L (ref 135–145)

## 2019-06-06 LAB — TROPONIN I (HIGH SENSITIVITY): Troponin I (High Sensitivity): 26 ng/L — ABNORMAL HIGH (ref <18)

## 2019-06-06 LAB — BODY FLUID CULTURE: Culture: NO GROWTH

## 2019-06-06 LAB — PROTIME-INR
INR: 1.3 — ABNORMAL HIGH (ref 0.8–1.2)
Prothrombin Time: 16.4 seconds — ABNORMAL HIGH (ref 11.4–15.2)

## 2019-06-06 LAB — HEPARIN LEVEL (UNFRACTIONATED): Heparin Unfractionated: 0.87 IU/mL — ABNORMAL HIGH (ref 0.30–0.70)

## 2019-06-06 LAB — APTT: aPTT: 78 seconds — ABNORMAL HIGH (ref 24–36)

## 2019-06-06 LAB — SARS CORONAVIRUS 2 BY RT PCR (HOSPITAL ORDER, PERFORMED IN ~~LOC~~ HOSPITAL LAB): SARS Coronavirus 2: NEGATIVE

## 2019-06-06 LAB — LACTIC ACID, PLASMA: Lactic Acid, Venous: 1 mmol/L (ref 0.5–1.9)

## 2019-06-06 MED ORDER — HEPARIN (PORCINE) 25000 UT/250ML-% IV SOLN
1150.0000 [IU]/h | INTRAVENOUS | Status: AC
  Administered 2019-06-06: 01:00:00 1150 [IU]/h via INTRAVENOUS
  Filled 2019-06-06: qty 250

## 2019-06-06 MED ORDER — POLYVINYL ALCOHOL 1.4 % OP SOLN
1.0000 [drp] | OPHTHALMIC | Status: DC | PRN
  Administered 2019-06-06 – 2019-06-07 (×2): 1 [drp] via OPHTHALMIC
  Filled 2019-06-06: qty 15

## 2019-06-06 MED ORDER — ALBUTEROL SULFATE (2.5 MG/3ML) 0.083% IN NEBU: 3.0000 mL | INHALATION_SOLUTION | RESPIRATORY_TRACT | Status: DC | PRN

## 2019-06-06 MED ORDER — ACETAMINOPHEN 325 MG PO TABS: 650.0000 mg | ORAL_TABLET | Freq: Four times a day (QID) | ORAL | Status: DC | PRN

## 2019-06-06 MED ORDER — POTASSIUM CHLORIDE CRYS ER 20 MEQ PO TBCR
50.0000 meq | EXTENDED_RELEASE_TABLET | Freq: Once | ORAL | Status: AC
  Administered 2019-06-06: 50 meq via ORAL
  Filled 2019-06-06: qty 2

## 2019-06-06 MED ORDER — ACETAMINOPHEN 650 MG RE SUPP: 650.0000 mg | Freq: Four times a day (QID) | RECTAL | Status: DC | PRN

## 2019-06-06 MED ORDER — ALBUMIN HUMAN 25 % IV SOLN
50.0000 g | Freq: Once | INTRAVENOUS | Status: AC
  Administered 2019-06-06: 12.5 g via INTRAVENOUS
  Filled 2019-06-06: qty 200

## 2019-06-06 MED ORDER — SODIUM CHLORIDE 0.9 % IV SOLN
Freq: Once | INTRAVENOUS | Status: AC
  Administered 2019-06-06: 01:00:00 via INTRAVENOUS

## 2019-06-06 MED ORDER — ONDANSETRON HCL 4 MG/2ML IJ SOLN: 4.0000 mg | Freq: Four times a day (QID) | INTRAMUSCULAR | Status: DC | PRN

## 2019-06-06 MED ORDER — ONDANSETRON HCL 4 MG PO TABS: 4.0000 mg | ORAL_TABLET | Freq: Four times a day (QID) | ORAL | Status: DC | PRN

## 2019-06-06 MED ORDER — ATORVASTATIN CALCIUM 10 MG PO TABS
10.0000 mg | ORAL_TABLET | Freq: Every day | ORAL | Status: DC
  Administered 2019-06-06 – 2019-06-08 (×3): 10 mg via ORAL
  Filled 2019-06-06 (×3): qty 1

## 2019-06-06 NOTE — Progress Notes (Signed)
IR aware of request for right Pleurx placement - this has been reviewed by Dr. Anselm Pancoast who approves procedure on the basis that this patient is accepted by palliative/hospice who will manage the drain. Palliative consult has been placed by primary team.   Will tentatively plan for Pleurx placement tomorrow as long as patient has been seen by palliative and approved for hospice services. I will place orders for pre-procedure labs, NPO after midnight, continue to hold Eliquis, heparin IV will need to be held 4 hours prior to planned procedure time.  Full consult/consent to follow once patient has been accepted by hospice.   Please call IR with questions or concerns.   Candiss Norse, PA-C

## 2019-06-06 NOTE — Plan of Care (Signed)

## 2019-06-06 NOTE — Progress Notes (Signed)
[place] for Heparin (Apixaban on hold) Indication: atrial fibrillation  No Known Allergies  Patient Measurements: Height: 5\' 5"  (165.1 cm) Weight: 125 lb 14.1 oz (57.1 kg) IBW/kg (Calculated) : 61.5  Vital Signs: Temp: 98.6 F (37 C) (06/25 0324) Temp Source: Oral (06/25 0324) BP: 143/46 (06/25 0324) Pulse Rate: 80 (06/25 0324)  Labs: Recent Labs    06/03/19 1500  06/04/19 0427 06/04/19 1613 06/05/19 0427 06/05/19 2128 06/06/19 0545  HGB  --    < > 9.2*  --  8.9* 8.6* 9.4*  HCT  --    < > 29.2*  --  28.5* 27.6* 29.6*  PLT  --    < > 404*  --  352 361 350  APTT  --    < > 63* 85* 77*  --  78*  LABPROT  --   --   --   --   --   --  16.4*  INR  --   --   --   --   --   --  1.3*  HEPARINUNFRC  --   --  1.26*  --  0.99*  --  0.87*  CREATININE  --   --  1.81*  --   --  2.14* 2.01*  TROPONINI 0.04*  --   --   --   --   --   --    < > = values in this interval not displayed.    Estimated Creatinine Clearance: 18.5 mL/min (A) (by C-G formula based on SCr of 2.01 mg/dL (H)).  Assessment: 6/21  85 y/o M presents to the ED with 2 day history of worsening shortness of breath on and holding apixaban during acute illness.  6/25 Readmit and resume heparin.  Did not receive any eliquis.   Confirmatory aptt still within range at 78s on 1150 units/hr of heparin. Heparin levels still not correlating.   Goal of Therapy:  Heparin level 0.3-0.7 units/ml aPTT 66-102 seconds Monitor platelets by anticoagulation protocol: Yes   Plan:  Continue heparin drip at 1150 units/hr Heparin level, aptt and cbc daily  Thanks for allowing pharmacy to be a part of this patient's care.  Erin Hearing PharmD., BCPS Clinical Pharmacist 06/06/2019 8:03 AM

## 2019-06-06 NOTE — Progress Notes (Signed)
Physical Therapy Evaluation Patient Details Name: Andre [name] MRN: [id number] DOB: 1923-11-18 Today's Date: 06/06/2019   History of Present Illness   Andre [name] is a 85 y.o. male with medical history significant of hypertension, diastolic CHF last EF 60 -63% in 02/2019, atrial fibrillation on Eliquis, complete heart block status post pacemaker, and chronic respiratory failure on 5 L nasal cannula oxygen; who presents with complaints of shortness of breath.  Patient had just been discharged from the hospital today for acute on chronic respiratory failure secondary to congestive heart failure exacerbation with right-sided pleural effusion.  During his hospital stay he was evaluated by PCCM who performed thoracentesis taking off 1670 ml of bloody fluid on 6/22.    Clinical Impression  Pt admitted with/for worsening SOB once home.  Pt still needing min to light moderate assist for mobility and gait.Marland Kitchen  Pt currently limited functionally due to the problems listed below.  (see problems list.)  Pt will benefit from PT to maximize function and safety to be able to get home safely with available assist.     Follow Up Recommendations Home health PT;Supervision/Assistance - 24 hour    Equipment Recommendations  None recommended by PT    Recommendations for Other Services       Precautions / Restrictions Precautions Precautions: Fall;Other (comment) Precaution Comments: monitor O2 sats Restrictions Weight Bearing Restrictions: No      Mobility  Bed Mobility Overal bed mobility: Needs Assistance Bed Mobility: Supine to Sit     Supine to sit: Supervision     General bed mobility comments: Supervision for safety and line management.   Transfers Overall transfer level: Needs assistance Equipment used: Rolling walker (2 wheeled) Transfers: Sit to/from Stand Sit to Stand: Min guard;Min assist         General transfer comment: Min for steadying assist. Cues for safe hand  placement.   Ambulation/Gait Ambulation/Gait assistance: Min assist Gait Distance (Feet): 110 Feet Assistive device: Rolling walker (2 wheeled) Gait Pattern/deviations: Step-through pattern Gait velocity: Decreased to moderate   General Gait Details: pt generally steady with poor use of the RW, stand out side the RW during straight-line walking and turns.  Sats on 4L [place] initially dropped into the upper 70's, but then pt asked to slow down and use more efficient breathing.  SpO2 rose to 93% on 4L  EHr in the upper 90's  Stairs            Wheelchair Mobility    Modified Rankin (Stroke Patients Only)       Balance Overall balance assessment: Needs assistance Sitting-balance support: No upper extremity supported;Feet supported Sitting balance-Leahy Scale: Fair     Standing balance support: Bilateral upper extremity supported;During functional activity Standing balance-Leahy Scale: Poor Standing balance comment: Reliant on BUE support                              Pertinent Vitals/Pain Pain Assessment: No/denies pain    Home Living Family/patient expects to be discharged to:: Private residence Living Arrangements: Spouse/significant other Available Help at Discharge: Family;Personal care attendant;Available 24 hours/day Type of Home: House Home Access: Stairs to enter Entrance Stairs-Rails: Left;Right;Can reach both Entrance Stairs-Number of Steps: 5 Home Layout: One level Home Equipment: Walker - 2 wheels Additional Comments: Reports he has 24/7 caregiver     Prior Function Level of Independence: Needs assistance   Gait / Transfers Assistance Needed: Pt reports using RW for  ambulation   ADL's / Homemaking Assistance Needed: Reports caregiver has been assisting with sponge bathing.         Hand Dominance        Extremity/Trunk Assessment   Upper Extremity Assessment Upper Extremity Assessment: Generalized weakness    Lower Extremity  Assessment Lower Extremity Assessment: Generalized weakness    Cervical / Trunk Assessment Cervical / Trunk Assessment: Kyphotic  Communication   Communication: HOH  Cognition Arousal/Alertness: Awake/alert Behavior During Therapy: WFL for tasks assessed/performed Overall Cognitive Status: Within Functional Limits for tasks assessed                                        General Comments      Exercises     Assessment/Plan    PT Assessment Patient needs continued PT services  PT Problem List Decreased strength;Decreased activity tolerance;Decreased balance;Decreased mobility;Decreased knowledge of use of DME;Decreased knowledge of precautions;Cardiopulmonary status limiting activity       PT Treatment Interventions DME instruction;Gait training;Functional mobility training;Patient/family education;Therapeutic activities;Therapeutic exercise;Stair training;Balance training    PT Goals (Current goals can be found in the Care Plan section)  Acute Rehab PT Goals Patient Stated Goal: get my breath under control and walk more. PT Goal Formulation: With patient Time For Goal Achievement: 06/20/19 Potential to Achieve Goals: Good    Frequency Min 3X/week   Barriers to discharge        Co-evaluation               AM-PAC PT "6 Clicks" Mobility  Outcome Measure Help needed turning from your back to your side while in a flat bed without using bedrails?: None Help needed moving from lying on your back to sitting on the side of a flat bed without using bedrails?: A Little Help needed moving to and from a bed to a chair (including a wheelchair)?: A Little Help needed standing up from a chair using your arms (e.g., wheelchair or bedside chair)?: A Little Help needed to walk in hospital room?: A Little Help needed climbing 3-5 steps with a railing? : A Lot 6 Click Score: 18    End of Session Equipment Utilized During Treatment: Gait belt;Oxygen Activity  Tolerance: Patient limited by fatigue;Patient tolerated treatment well Patient left: in chair;with call bell/phone within reach Nurse Communication: Mobility status PT Visit Diagnosis: Unsteadiness on feet (R26.81);Muscle weakness (generalized) (M62.81)    Time: 5400-8676 PT Time Calculation (min) (ACUTE ONLY): 27 min   Charges:   PT Evaluation $PT Eval Moderate Complexity: 1 Mod PT Treatments $Gait Training: 8-22 mins        06/06/2019  Donnella Sham, PT Acute Rehabilitation Services [phone]  (pager) [phone]  (office)  Tessie Fass Sudeep Scheibel 06/06/2019, 3:19 PM

## 2019-06-06 NOTE — ED Notes (Signed)
ED TO INPATIENT HANDOFF REPORT  ED Nurse Name and Phone #: [phone] Andre [name] Name/Age/Gender Andre [name] 85 y.o. male Room/Bed: 023C/023C  Code Status   Code Status: Full Code  Home/SNF/Other Home Patient oriented to: self, place, time and situation Is this baseline? Yes   Triage Complete: Triage complete  Chief Complaint SOB  Triage Note Pt BIB [facility] from home, pt was walking, felt weakness in both legs and had a near fall. Denies LOC, states he has been feeling more short of breath than usual. Home health aid increased pt's home O2 to 8L [place], pt arrived on 6L [place]. Reports improvement of shortness of breath. EMS VS: BP 90/60, SpO2 94% 6L [place].   Allergies No Known Allergies  Level of Care/Admitting Diagnosis ED Disposition    ED Disposition Condition [facility] Area: [place] [100100]  Level of Care: Progressive [102]  I expect the patient will be discharged within 24 hours: No (not a candidate for 5C-Observation unit)  Covid Evaluation: Screening Protocol (No Symptoms)  Diagnosis: Recurrent pleural effusion on right [956213]  Admitting Physician: Norval Morton [[id number]]  Attending Physician: Norval Morton [[id number]]  PT Class (Do Not Modify): Observation [104]  PT Acc Code (Do Not Modify): Observation [10022]       B Medical/Surgery History Past Medical History:  Diagnosis Date  . Acute on chronic respiratory failure with hypoxia ([name])   . Adenomatous colon polyp 04/1985   no further colonoscopy, 2008-last colonoscopy, no polyps    . Atrial fibrillation ([place])   . Chronic atrial fibrillation   . Chronic diastolic heart failure ([place])   . Diverticulosis   . History of skin cancer    dermatology every 6 months Dr. [name]  . Hyperlipidemia   . Hypertension   . Lobar pneumonia, unspecified organism ([place])   . Macular degeneration    bilateral  . Nephrolithiasis   . PAF (paroxysmal atrial fibrillation) ([place])   .  PAT (paroxysmal atrial tachycardia) ([facility])    many years ago, worse with smoking  . Severe sepsis [facility])    Past Surgical History:  Procedure Laterality Date  . CARDIOVERSION N/A 05/23/2016   Procedure: CARDIOVERSION;  Surgeon: [name], MD;  Location: [facility] ENDOSCOPY;  Service: Cardiovascular;  Laterality: N/A;  . CARDIOVERSION N/A 03/01/2019   Procedure: CARDIOVERSION;  Surgeon: Lelon Perla, MD;  Location: [facility] ENDOSCOPY;  Service: Cardiovascular;  Laterality: N/A;  . CATARACT EXTRACTION     bilateral  . INGUINAL HERNIA REPAIR    . PACEMAKER IMPLANT N/A 02/24/2019   Procedure: PACEMAKER IMPLANT;  Surgeon: Deboraha Sprang, MD;  Location: [facility] CV LAB;  Service: Cardiovascular;  Laterality: N/A;  . TEE WITHOUT CARDIOVERSION N/A 03/22/2016   Procedure: TRANSESOPHAGEAL ECHOCARDIOGRAM (TEE);  Surgeon: [name], MD;  Location: [place];  Service: Cardiovascular;  Laterality: N/A;  . TEE WITHOUT CARDIOVERSION N/A 05/23/2016   Procedure: TRANSESOPHAGEAL ECHOCARDIOGRAM (TEE);  Surgeon: [name], MD;  Location: [facility] ENDOSCOPY;  Service: Cardiovascular;  Laterality: N/A;  . TEE WITHOUT CARDIOVERSION N/A 03/01/2019   Procedure: TRANSESOPHAGEAL ECHOCARDIOGRAM (TEE);  Surgeon: Lelon Perla, MD;  Location: [facility] ENDOSCOPY;  Service: Cardiovascular;  Laterality: N/A;  . TENDON REPAIR  12/11   right leg     A IV Location/Drains/Wounds Patient Lines/Drains/Airways Status   Active Line/Drains/Airways    Name:   Placement date:   Placement time:   Site:   Days:   Peripheral  IV 06/02/19 Left Antecubital   06/02/19    1554    Antecubital   4   Peripheral IV 06/02/19 Right Forearm   06/02/19    1900    Forearm   4   Peripheral IV 06/05/19 Left Antecubital   06/05/19    2036    Antecubital   1   External Urinary Catheter   06/02/19    1722    -   4   Incision (Closed) 02/24/19 Chest Left;Anterior   02/24/19    1030     102          Intake/Output Last 24 hours No intake or  output data in the 24 hours ending 06/06/19 0038  Labs/Imaging Results for orders placed or performed during the hospital encounter of 06/05/19 (from the past 48 hour(s))  Basic metabolic panel     Status: Abnormal   Collection Time: 06/05/19  9:28 PM  Result Value Ref Range   Sodium 133 (L) 135 - 145 mmol/L   Potassium 3.8 3.5 - 5.1 mmol/L   Chloride 95 (L) 98 - 111 mmol/L   CO2 26 22 - 32 mmol/L   Glucose, Bld 142 (H) 70 - 99 mg/dL   BUN 39 (H) 8 - 23 mg/dL   Creatinine, Ser 2.14 (H) 0.61 - 1.24 mg/dL   Calcium 8.4 (L) 8.9 - 10.3 mg/dL   GFR calc non Af Amer 26 (L) >60 mL/min   GFR calc Af Amer 30 (L) >60 mL/min   Anion gap 12 5 - 15    Comment: Performed at [facility], [address], [place], [place] [address]  CBC with Differential     Status: Abnormal   Collection Time: 06/05/19  9:28 PM  Result Value Ref Range   WBC 14.0 (H) 4.0 - 10.5 K/uL   RBC 3.29 (L) 4.22 - 5.81 MIL/uL   Hemoglobin 8.6 (L) 13.0 - 17.0 g/dL   HCT 27.6 (L) 39.0 - 52.0 %   MCV 83.9 80.0 - 100.0 fL   MCH 26.1 26.0 - 34.0 pg   MCHC 31.2 30.0 - 36.0 g/dL   RDW 16.2 (H) 11.5 - 15.5 %   Platelets 361 150 - 400 K/uL   nRBC 0.0 0.0 - 0.2 %   Neutrophils Relative % 84 %   Neutro Abs 11.7 (H) 1.7 - 7.7 K/uL   Lymphocytes Relative 10 %   Lymphs Abs 1.5 0.7 - 4.0 K/uL   Monocytes Relative 5 %   Monocytes Absolute 0.7 0.1 - 1.0 K/uL   Eosinophils Relative 0 %   Eosinophils Absolute 0.0 0.0 - 0.5 K/uL   Basophils Relative 0 %   Basophils Absolute 0.0 0.0 - 0.1 K/uL   Immature Granulocytes 1 %   Abs Immature Granulocytes 0.09 (H) 0.00 - 0.07 K/uL    Comment: Performed at [facility] [address], [place], [place] [address]  Brain natriuretic peptide     Status: Abnormal   Collection Time: 06/05/19  9:28 PM  Result Value Ref Range   B Natriuretic Peptide 264.6 (H) 0.0 - 100.0 pg/mL    Comment: Performed at [facility] [address] [place], [place] [address]  Lactic acid, plasma      Status: None   Collection Time: 06/05/19  9:28 PM  Result Value Ref Range   Lactic Acid, Venous 1.8 0.5 - 1.9 mmol/L    Comment: Performed at [facility]  [address] ,  [address]   Dg Chest 2 View  Result Date: 06/05/2019 CLINICAL DATA:  85 year old male with shortness of breath. EXAM: CHEST - 2 VIEW COMPARISON:  Chest CT dated 06/03/2019 FINDINGS: There is emphysema and chronic interstitial coarsening. Moderate right pleural effusion and associated atelectatic changes. No pneumothorax. The cardiac silhouette is within normal limits. Left pectoral pacemaker device. Osteopenia with degenerative changes of the spine. No acute osseous pathology. IMPRESSION: 1. Moderate right pleural effusion and associated atelectatic changes similar to the prior CT. 2. Emphysema. Electronically Signed   By: Anner Crete M.D.   On: 06/05/2019 21:22    Pending Labs Unresulted Labs (From admission, onward)    Start     Ordered   06/06/19 0900  Heparin level (unfractionated)  Once-Timed,   STAT     06/06/19 0030   06/06/19 0900  APTT  Once-Timed,   STAT     06/06/19 0030   06/06/19 0500  CBC  Tomorrow morning,   R     06/06/19 0017   06/06/19 3474  Basic metabolic panel  Tomorrow morning,   R     06/06/19 0017   06/06/19 0500  Protime-INR  Tomorrow morning,   R     06/06/19 0017   06/06/19 0500  Magnesium  Tomorrow morning,   R     06/06/19 0023   06/05/19 2339  SARS Coronavirus 2 (CEPHEID - Performed in [place] hospital lab), Hosp Order  (Asymptomatic Patients Labs)  Once,   STAT    Question:  Rule Out  Answer:  Yes   06/05/19 2338   06/05/19 2104  Blood culture (routine x 2)  BLOOD CULTURE X 2,   STAT     06/05/19 2103   06/05/19 2104  Lactic acid, plasma  Now then every 2 hours,   STAT     06/05/19 2103   06/05/19 2103  Urinalysis, Routine w reflex microscopic  ONCE - STAT,   STAT     06/05/19 2102   06/05/19 2059  Troponin I (High Sensitivity)  STAT Now then  every 2 hours,   R (with STAT occurrences)    Question:  Indication  Answer:  Suspect ACS   06/05/19 2102          Vitals/Pain Today's Vitals   06/05/19 2315 06/05/19 2330 06/05/19 2345 06/06/19 0000  BP: (!) 131/38 (!) 125/44 (!) 113/41 (!) 120/45  Pulse: 76 75 75 73  Resp: (!) 21 16 (!) 21 (!) 22  Temp:      TempSrc:      SpO2: 95% 93% 92% 96%  PainSc:        Isolation Precautions No active isolations  Medications Medications  albuterol (PROVENTIL) (2.5 MG/3ML) 0.083% nebulizer solution 3 mL (has no administration in time range)  atorvastatin (LIPITOR) tablet 10 mg (has no administration in time range)  ondansetron (ZOFRAN) tablet 4 mg (has no administration in time range)    Or  ondansetron (ZOFRAN) injection 4 mg (has no administration in time range)  acetaminophen (TYLENOL) tablet 650 mg (has no administration in time range)    Or  acetaminophen (TYLENOL) suppository 650 mg (has no administration in time range)  heparin ADULT infusion 100 units/mL (25000 units/216mL sodium chloride 0.45%) (has no administration in time range)  0.9 %  sodium chloride infusion (has no administration in time range)  sodium chloride 0.9 % bolus 500 mL (500 mLs Intravenous New Bag/Given 06/05/19 2135)    Mobility  walks Moderate fall risk   Focused Assessments Pulmonary Assessment Handoff:  Lung sounds: Bilateral Breath Sounds: Diminished O2 Device: Nasal Cannula O2 Flow Rate (L/min): 3 L/min      R Recommendations: See Admitting Provider Note  Report given to:   Additional Notes: currently on nasal cannula at 4L. Wears 3L at baseline.

## 2019-06-06 NOTE — Progress Notes (Signed)
PROGRESS NOTE    Andre [name]  JJK:[id number] DOB: 06/29/1923 DOA: 06/05/2019 PCP: [name], MD   Brief Narrative:  85 y.o. male PMHx HTN, diastolic CHF, last EF 67 -65% in 02/2019, atrial fibrillation on Eliquis, complete heart block status post pacemaker, and chronic respiratory failure on 5 L nasal cannula oxygen;   Presents with complaints of shortness of breath.  Patient admitted on 6/21 and had just been discharged from the hospital 6/24 for acute on chronic respiratory failure secondary to congestive heart failure exacerbation with right-sided pleural effusion.  During his hospital stay he was evaluated by PCCM who performed thoracentesis taking off 1670 ml of bloody fluid on 6/22.  The fluid was exudative with reactive mesothelial cells.  After getting home, he reported getting up to walk around and feeling very short of breath with any exertion.  He was going to use the restroom while using his walker when he reports that his legs gave out.  He states that he would have fallen at the nursing aide had not been present.  Associated times included intermittent chronic dry cough, lower extremities swelling, easy bruisability.  Denies having any lightheadedness, nausea, vomiting, abdominal pain, loss of consciousness, or trauma to his head.   ED Course: Upon admission into the emergency department patient was noted to be afebrile, blood pressure 91/48 with improved to 151/52 with 500 ml of IV fluids, and O2 saturations 92-100% with 4 L nasal cannula oxygen.  Labs revealed WBC 14, hemoglobin 8.6, sodium 133 BUN 39, creatinine 2.14(previously 1.81 this a.m), and lactic acid 1.8.  Chest x-ray showing moderate right-sided pleural effusion.  TRH called to admit.  Accepted to a progressive bed    Subjective: A/O x4, negative CP, positive acute on chronic S OB, negative abdominal pain.  States on previous visit 2 days ago was not informed of condition of his lungs.     Assessment &  Plan:   Principal Problem:   Acute on chronic respiratory failure with hypoxia (HCC) Active Problems:   Hyperlipidemia   Acute-on-chronic kidney disease stage III   Atrial fibrillation/flutter   Pacemaker   Chronic diastolic heart failure (HCC)   Recurrent pleural effusion on right   Hypotension   Acute on chronic respiratory failure with hypoxia, emphysema, recurrent right-sided pleural effusion: Patient presents with complaints of worsening shortness of breath.  Chest x-ray shows moderate right-sided pleural effusion with associated atelectatic changes.  Patient maintaining O2 saturations on 4 L currently.  Last thoracentesis performed by PCCM 2 days ago with 1670 milliliters of bloody pleural fluid removed.  Fluid was exudative and cytology revealed mesothelial cells. -Admit to a progressive bed -Continuous pulse oximetry with nasal cannula oxygen as needed -Discussed over the phone with PCCM physician in blackbox who suggested IR -Consulted IR in a.m. for possible need of Pleurx catheter   .   Acute kidney injury superimposed on chronic kidney disease stage 3: Creatinine 2.14 with BUN 31.  Previously creatinine at discharge had been around 1.7 -1.8.  Suspect possibly related with overdiuresis. -Recheck creatinine in a.m. -Consider additional IV fluids as needed   Leukocytosis: Acute on chronic.  WBC 14 which is higher than that discharge.  Suspect this is likely reactive in nature. -Continue to monitor   Weakness: Patient noted to have bilateral lower extremity weakness. -PT to eval and treat   Normocytic hypochromic anemia: Acute on chronic.  Hemoglobin 8.6 on admission which appears similar to previous. -Continue to monitor recheck CBC in  a.m.  Diastolic CHF: Echocardiogram from 02/2019 shows EF of 60 to 65%.  Previously discharged home 2 weeks ago on just 20 mg of Lasix, but during most recent discharge increase to 40 mg twice daily.   Atrial fibrillation on chronic  anticoagulation, Complete Heart Block status post pacemaker: Patient currently rate controlled and had restarted on Eliquis prior to discharge. CHA2DS2-VASc score =at least 4.   -Continue amiodarone -Hold Eliquis for possible need of procedure -Heparin per pharmacy   Hypotension - Albumin 50 g x 1 administered in unit - Normal saline 500 ml administered in ED - Patient's BP stabilized.  Patient and daughter understand this is only temporary given his tenuous overall condition. - Hold all hypertensive meds    Hyponatremia: Sodium 133.  Suspect secondary to possible overdiuresis.  -Recheck sodium levels in a.m.  Hypokalemia - Potassium goal> 4 - Potassium p.o. 50 mEq  Hypoalbunemia - 6/25 Albumin 50 g - Repeat albumin level daily x3 days   Hyperlipidemia -Continue atorvastatin  Goals of care - 6/24 palliative care:Spoke at length with patient and daughter they understand that he is in the terminal phase of his life and would like to pursue hospice.  Have requested placement of Pleurx cath in order to allow patient to stay out of the hospital and spend as much time as possible with family.  Evaluate patient for home services.  Please ensure daughter is contacted and kept in the loop.    DVT prophylaxis: Heparin drip Code Status: DNR Family Communication: Spoke with daughter [name] at length and discussed patient's wish to go hospice route and have Pleurx cath placed Disposition Plan: Home with hospice   Consultants:  Sheral Apley     Procedures/Significant Events:  6/22 CT chest  Wo contrast:1. Moderate partially loculated right pleural effusion which measures simple fluid density, no CT findings to suggest hemorrhage. Associated compressive atelectasis. 2. Emphysema with diffuse interstitial coarsening, bronchiectasis, possible basilar honeycombing. Findings suggest underlying interstitial lung disease. 3. Subpleural 5 mm left upper lobe pulmonary nodule, more  conspicuous than on prior exam. Recommend follow-up CT in 1 year, giving consideration for patient's advanced age. 4. Right anterior epicardial rounded soft tissue density is stable from exam 2 months ago, indeterminate. 6/22 RIGHT thoracentesis: 1670 bloody exudate aspirated.  Note did not fully resolve effusion secondary to partially loculated RIGHT effusion. ---------------------------------------------------------------------------------------------------------------     I have personally reviewed and interpreted all radiology studies and my findings are as above.  VENTILATOR SETTINGS:    Cultures   Antimicrobials:    Devices    LINES / TUBES:  Permanent pacer left side of chest wall>>    Continuous Infusions: . heparin 1,150 Units/hr (06/06/19 0200)     Objective: Vitals:   06/06/19 0045 06/06/19 0100 06/06/19 0115 06/06/19 0324  BP: (!) 131/51 (!) 107/46 (!) 137/50 (!) 143/46  Pulse: 79 77 76 80  Resp: 19 18 20 20   Temp:   98.1 F (36.7 C) 98.6 F (37 C)  TempSrc:    Oral  SpO2: 91% 91% 95% 98%  Weight:   57.1 kg   Height:   5\' 5"  (1.651 m)     Intake/Output Summary (Last 24 hours) at 06/06/2019 0726 Last data filed at 06/06/2019 0200 Gross per 24 hour  Intake 516.01 ml  Output -  Net 516.01 ml   Filed Weights   06/06/19 0115  Weight: 57.1 kg    Examination:  General: A/O x4, positive acute on chronic  respiratory distress, cachectic Eyes: negative scleral hemorrhage, negative anisocoria, negative icterus ENT: Negative Runny nose, negative gingival bleeding, Neck:  Negative scars, masses, torticollis, lymphadenopathy, JVD Lungs: LEFT LUNG FIELDS CLEAR TO AUSCULTATION, RIGHT lung fields absent breath sounds, without wheezes or crackles Cardiovascular: Irregular irregular rhythm and rate, positive grade 3/6 holosystolic murmur, negative gallop or rub normal S1 and S2 Abdomen: negative abdominal pain, nondistended, positive soft, bowel sounds,  no rebound, no ascites, no appreciable mass Extremities: No significant cyanosis, clubbing, or edema bilateral lower extremities Skin: Negative rashes, lesions, ulcers Psychiatric:  Negative depression, negative anxiety, negative fatigue, negative mania  Central nervous system:  Cranial nerves II through XII intact, tongue/uvula midline, all extremities muscle strength 5/5, sensation intact throughout, negative dysarthria, negative expressive aphasia, negative receptive aphasia.  .     Data Reviewed: Care during the described time interval was provided by me .  I have reviewed this patient's available data, including medical history, events of note, physical examination, and all test results as part of my evaluation.   CBC: Recent Labs  Lab 06/02/19 1705 06/03/19 0237 06/03/19 0707 06/04/19 0427 06/05/19 0427 06/05/19 2128 06/06/19 0545  WBC 14.1* 13.2* 12.6* 12.9* 12.0* 14.0* 12.0*  NEUTROABS 11.1* 9.4*  --  8.4*  --  11.7*  --   HGB 8.5* 9.3* 9.0* 9.2* 8.9* 8.6* 9.4*  HCT 27.8* 30.3* 29.3* 29.2* 28.5* 27.6* 29.6*  MCV 86.6 84.6 86.4 83.4 82.1 83.9 82.0  PLT 497* 436* 411* 404* 352 361 025   Basic Metabolic Panel: Recent Labs  Lab 06/02/19 1705 06/02/19 2327 06/03/19 0237 06/04/19 0427 06/05/19 2128 06/06/19 0545  NA 135  --  137 134* 133* 135  K 4.8  --  3.7 3.4* 3.8 3.4*  CL 101  --  102 96* 95* 96*  CO2 24  --  25 27 26 28   GLUCOSE 114*  --  118* 99 142* 112*  BUN 29*  --  28* 32* 39* 36*  CREATININE 1.84*  --  1.70* 1.81* 2.14* 2.01*  CALCIUM 8.7*  --  8.6* 8.5* 8.4* 8.6*  MG  --  2.0  --   --   --  1.9  PHOS  --   --   --  4.8*  --   --    GFR: Estimated Creatinine Clearance: 18.5 mL/min (A) (by C-G formula based on SCr of 2.01 mg/dL (H)). Liver Function Tests: Recent Labs  Lab 06/02/19 1705 06/03/19 0237 06/04/19 0427  AST 33 11*  --   ALT 18 14  --   ALKPHOS 63 62  --   BILITOT 1.1 0.3  --   PROT 6.3* 6.2*  --   ALBUMIN 2.3* 2.2* 2.0*   No  results for input(s): LIPASE, AMYLASE in the last 168 hours. No results for input(s): AMMONIA in the last 168 hours. Coagulation Profile: Recent Labs  Lab 06/06/19 0545  INR 1.3*   Cardiac Enzymes: Recent Labs  Lab 06/02/19 1705 06/02/19 2327 06/03/19 0237 06/03/19 1500  TROPONINI 0.04* 0.04* 0.04* 0.04*   BNP (last 3 results) No results for input(s): PROBNP in the last 8760 hours. HbA1C: No results for input(s): HGBA1C in the last 72 hours. CBG: No results for input(s): GLUCAP in the last 168 hours. Lipid Profile: No results for input(s): CHOL, HDL, LDLCALC, TRIG, CHOLHDL, LDLDIRECT in the last 72 hours. Thyroid Function Tests: No results for input(s): TSH, T4TOTAL, FREET4, T3FREE, THYROIDAB in the last 72 hours. Anemia Panel: No results for input(s):  VITAMINB12, FOLATE, FERRITIN, TIBC, IRON, RETICCTPCT in the last 72 hours. Urine analysis:    Component Value Date/Time   COLORURINE YELLOW 06/06/2019 0119   APPEARANCEUR CLEAR 06/06/2019 0119   LABSPEC 1.009 06/06/2019 0119   PHURINE 6.0 06/06/2019 0119   GLUCOSEU NEGATIVE 06/06/2019 0119   HGBUR NEGATIVE 06/06/2019 0119   BILIRUBINUR NEGATIVE 06/06/2019 0119   BILIRUBINUR Negative 12/11/2018 0947   KETONESUR NEGATIVE 06/06/2019 0119   PROTEINUR 30 (A) 06/06/2019 0119   UROBILINOGEN 0.2 12/11/2018 0947   UROBILINOGEN 0.2 12/08/2010 0639   NITRITE NEGATIVE 06/06/2019 0119   LEUKOCYTESUR NEGATIVE 06/06/2019 0119   Sepsis Labs: @LABRCNTIP (procalcitonin:4,lacticidven:4)  ) Recent Results (from the past 240 hour(s))  SARS Coronavirus 2 (CEPHEID- Performed in [place] hospital lab), Hosp Order     Status: None   Collection Time: 06/02/19  5:05 PM   Specimen: Nasopharyngeal Swab  Result Value Ref Range Status   SARS Coronavirus 2 NEGATIVE NEGATIVE Final    Comment: (NOTE) If result is NEGATIVE SARS-CoV-2 target nucleic acids are NOT DETECTED. The SARS-CoV-2 RNA is generally detectable in upper and lower   respiratory specimens during the acute phase of infection. The lowest  concentration of SARS-CoV-2 viral copies this assay can detect is 250  copies / mL. A negative result does not preclude SARS-CoV-2 infection  and should not be used as the sole basis for treatment or other  patient management decisions.  A negative result may occur with  improper specimen collection / handling, submission of specimen other  than nasopharyngeal swab, presence of viral mutation(s) within the  areas targeted by this assay, and inadequate number of viral copies  (<250 copies / mL). A negative result must be combined with clinical  observations, patient history, and epidemiological information. If result is POSITIVE SARS-CoV-2 target nucleic acids are DETECTED. The SARS-CoV-2 RNA is generally detectable in upper and lower  respiratory specimens dur ing the acute phase of infection.  Positive  results are indicative of active infection with SARS-CoV-2.  Clinical  correlation with patient history and other diagnostic information is  necessary to determine patient infection status.  Positive results do  not rule out bacterial infection or co-infection with other viruses. If result is PRESUMPTIVE POSTIVE SARS-CoV-2 nucleic acids MAY BE PRESENT.   A presumptive positive result was obtained on the submitted specimen  and confirmed on repeat testing.  While 2019 novel coronavirus  (SARS-CoV-2) nucleic acids may be present in the submitted sample  additional confirmatory testing may be necessary for epidemiological  and / or clinical management purposes  to differentiate between  SARS-CoV-2 and other Sarbecovirus currently known to infect humans.  If clinically indicated additional testing with an alternate test  methodology [phone]) is advised. The SARS-CoV-2 RNA is generally  detectable in upper and lower respiratory sp ecimens during the acute  phase of infection. The expected result is Negative. Fact  Sheet for Patients:  StrictlyIdeas.no Fact Sheet for Healthcare Providers: BankingDealers.co.za This test is not yet approved or cleared by the [place] FDA and has been authorized for detection and/or diagnosis of SARS-CoV-2 by FDA under an Emergency Use Authorization (EUA).  This EUA will remain in effect (meaning this test can be used) for the duration of the COVID-19 declaration under Section 564(b)(1) of the Act, 21 U.S.C. section 360bbb-3(b)(1), unless the authorization is terminated or revoked sooner. Performed at [facility], [place] [address], [place], [place] [address]   Body fluid culture (includes gram stain)  Status: None   Collection Time: 06/03/19  6:03 AM   Specimen: Pleural Fluid  Result Value Ref Range Status   Specimen Description PLEURAL RIGHT  Final   Special Requests NONE  Final   Gram Stain   Final    MODERATE WBC PRESENT,BOTH PMN AND MONONUCLEAR NO ORGANISMS SEEN    Culture   Final    NO GROWTH 3 DAYS Performed at [facility], [address] [place], [place] [address]    Report Status 06/06/2019 FINAL  Final  MRSA PCR Screening     Status: None   Collection Time: 06/03/19  4:34 PM   Specimen: Nasopharyngeal  Result Value Ref Range Status   MRSA by PCR NEGATIVE NEGATIVE Final    Comment:        The GeneXpert MRSA Assay (FDA approved for NASAL specimens only), is one component of a comprehensive MRSA colonization surveillance program. It is not intended to diagnose MRSA infection nor to guide or monitor treatment for MRSA infections. Performed at [facility], [place] [address] [place], [place] [address]   SARS Coronavirus 2 (CEPHEID - Performed in [place] hospital lab), Hosp Order     Status: None   Collection Time: 06/06/19 12:57 AM   Specimen: Nasopharyngeal Swab  Result Value Ref Range Status   SARS Coronavirus 2 NEGATIVE NEGATIVE Final    Comment: (NOTE) If result is  NEGATIVE SARS-CoV-2 target nucleic acids are NOT DETECTED. The SARS-CoV-2 RNA is generally detectable in upper and lower  respiratory specimens during the acute phase of infection. The lowest  concentration of SARS-CoV-2 viral copies this assay can detect is 250  copies / mL. A negative result does not preclude SARS-CoV-2 infection  and should not be used as the sole basis for treatment or other  patient management decisions.  A negative result may occur with  improper specimen collection / handling, submission of specimen other  than nasopharyngeal swab, presence of viral mutation(s) within the  areas targeted by this assay, and inadequate number of viral copies  (<250 copies / mL). A negative result must be combined with clinical  observations, patient history, and epidemiological information. If result is POSITIVE SARS-CoV-2 target nucleic acids are DETECTED. The SARS-CoV-2 RNA is generally detectable in upper and lower  respiratory specimens dur ing the acute phase of infection.  Positive  results are indicative of active infection with SARS-CoV-2.  Clinical  correlation with patient history and other diagnostic information is  necessary to determine patient infection status.  Positive results do  not rule out bacterial infection or co-infection with other viruses. If result is PRESUMPTIVE POSTIVE SARS-CoV-2 nucleic acids MAY BE PRESENT.   A presumptive positive result was obtained on the submitted specimen  and confirmed on repeat testing.  While 2019 novel coronavirus  (SARS-CoV-2) nucleic acids may be present in the submitted sample  additional confirmatory testing may be necessary for epidemiological  and / or clinical management purposes  to differentiate between  SARS-CoV-2 and other Sarbecovirus currently known to infect humans.  If clinically indicated additional testing with an alternate test  methodology [phone]) is advised. The SARS-CoV-2 RNA is generally  detectable  in upper and lower respiratory sp ecimens during the acute  phase of infection. The expected result is Negative. Fact Sheet for Patients:  StrictlyIdeas.no Fact Sheet for Healthcare Providers: BankingDealers.co.za This test is not yet approved or cleared by the [place] FDA and has been authorized for detection and/or diagnosis of  SARS-CoV-2 by FDA under an Emergency Use Authorization (EUA).  This EUA will remain in effect (meaning this test can be used) for the duration of the COVID-19 declaration under Section 564(b)(1) of the Act, 21 U.S.C. section 360bbb-3(b)(1), unless the authorization is terminated or revoked sooner. Performed at [facility], [place] [address] [place], [place] [address]          Radiology Studies: Dg Chest 2 View  Result Date: 06/05/2019 CLINICAL DATA:  85 year old male with shortness of breath. EXAM: CHEST - 2 VIEW COMPARISON:  Chest CT dated 06/03/2019 FINDINGS: There is emphysema and chronic interstitial coarsening. Moderate right pleural effusion and associated atelectatic changes. No pneumothorax. The cardiac silhouette is within normal limits. Left pectoral pacemaker device. Osteopenia with degenerative changes of the spine. No acute osseous pathology. IMPRESSION: 1. Moderate right pleural effusion and associated atelectatic changes similar to the prior CT. 2. Emphysema. Electronically Signed   By: Anner Crete M.D.   On: 06/05/2019 21:22        Scheduled Meds: . atorvastatin  10 mg Oral Daily   Continuous Infusions: . heparin 1,150 Units/hr (06/06/19 0200)     LOS: 0 days   The patient is critically ill with multiple organ systems failure and requires high complexity decision making for assessment and support, frequent evaluation and titration of therapies, application of advanced monitoring technologies and extensive interpretation of multiple databases. Critical Care Time devoted to  patient care services described in this note  Time spent: 40 minutes     Cienna Dumais, Geraldo Docker, MD [facility] Pager [phone]  If 7PM-7AM, please contact night-coverage [email or web address] Password [facility] 06/06/2019, 7:26 AM

## 2019-06-06 NOTE — Progress Notes (Signed)
[place] for Heparin (Apixaban on hold) Indication: atrial fibrillation  No Known Allergies  Patient Measurements:    Vital Signs: Temp: 97.6 F (36.4 C) (06/24 1958) Temp Source: Oral (06/24 1958) BP: 120/45 (06/25 0000) Pulse Rate: 73 (06/25 0000)  Labs: Recent Labs    06/03/19 0237  06/03/19 0836 06/03/19 1500  06/04/19 0427 06/04/19 1613 06/05/19 0427 06/05/19 2128  HGB 9.3*   < >  --   --   --  9.2*  --  8.9* 8.6*  HCT 30.3*   < >  --   --   --  29.2*  --  28.5* 27.6*  PLT 436*   < >  --   --   --  404*  --  352 361  APTT  --   --  51*  --    < > 63* 85* 77*  --   HEPARINUNFRC  --   --  1.88*  --   --  1.26*  --  0.99*  --   CREATININE 1.70*  --   --   --   --  1.81*  --   --  2.14*  TROPONINI 0.04*  --   --  0.04*  --   --   --   --   --    < > = values in this interval not displayed.    Estimated Creatinine Clearance: 17.7 mL/min (A) (by C-G formula based on SCr of 2.14 mg/dL (H)).  Assessment: 6/21  85 y/o M presents to the ED with 2 day history of worsening shortness of breath on and holding apixaban during acute illness. APTT on heparin 1150 units/mr 77sec 6/25 Readmit and resume heparin.  Did not receive any eliquis  Goal of Therapy:  Heparin level 0.3-0.7 units/ml aPTT 66-102 seconds Monitor platelets by anticoagulation protocol: Yes   Plan:  Continue heparin drip at 1150 units/hr Check heparin level and aPTT 8 hours after start  Thanks for allowing pharmacy to be a part of this patient's care.  Excell Seltzer, PharmD Clinical Pharmacist

## 2019-06-07 ENCOUNTER — Telehealth: Payer: Self-pay

## 2019-06-07 ENCOUNTER — Inpatient Hospital Stay (HOSPITAL_COMMUNITY): Payer: Medicare Other

## 2019-06-07 DIAGNOSIS — R0602 Shortness of breath: Secondary | ICD-10-CM

## 2019-06-07 DIAGNOSIS — J439 Emphysema, unspecified: Secondary | ICD-10-CM | POA: Diagnosis present

## 2019-06-07 DIAGNOSIS — I4811 Longstanding persistent atrial fibrillation: Secondary | ICD-10-CM

## 2019-06-07 DIAGNOSIS — I5023 Acute on chronic systolic (congestive) heart failure: Secondary | ICD-10-CM

## 2019-06-07 DIAGNOSIS — Z515 Encounter for palliative care: Secondary | ICD-10-CM

## 2019-06-07 DIAGNOSIS — J90 Pleural effusion, not elsewhere classified: Secondary | ICD-10-CM | POA: Diagnosis present

## 2019-06-07 DIAGNOSIS — J431 Panlobular emphysema: Secondary | ICD-10-CM

## 2019-06-07 DIAGNOSIS — J849 Interstitial pulmonary disease, unspecified: Secondary | ICD-10-CM

## 2019-06-07 DIAGNOSIS — E785 Hyperlipidemia, unspecified: Secondary | ICD-10-CM | POA: Diagnosis present

## 2019-06-07 DIAGNOSIS — N179 Acute kidney failure, unspecified: Secondary | ICD-10-CM | POA: Diagnosis present

## 2019-06-07 DIAGNOSIS — Z7189 Other specified counseling: Secondary | ICD-10-CM

## 2019-06-07 DIAGNOSIS — M6281 Muscle weakness (generalized): Secondary | ICD-10-CM | POA: Diagnosis present

## 2019-06-07 DIAGNOSIS — I442 Atrioventricular block, complete: Secondary | ICD-10-CM | POA: Diagnosis present

## 2019-06-07 HISTORY — PX: IR PERC PLEURAL DRAIN W/INDWELL CATH W/IMG GUIDE: IMG5383

## 2019-06-07 LAB — BASIC METABOLIC PANEL
Anion gap: 12 (ref 5–15)
BUN: 30 mg/dL — ABNORMAL HIGH (ref 8–23)
CO2: 25 mmol/L (ref 22–32)
Calcium: 8.7 mg/dL — ABNORMAL LOW (ref 8.9–10.3)
Chloride: 102 mmol/L (ref 98–111)
Creatinine, Ser: 1.63 mg/dL — ABNORMAL HIGH (ref 0.61–1.24)
GFR calc Af Amer: 41 mL/min — ABNORMAL LOW (ref >60)
GFR calc non Af Amer: 36 mL/min — ABNORMAL LOW (ref >60)
Glucose, Bld: 96 mg/dL (ref 70–99)
Potassium: 4.1 mmol/L (ref 3.5–5.1)
Sodium: 139 mmol/L (ref 135–145)

## 2019-06-07 LAB — MAGNESIUM: Magnesium: 1.9 mg/dL (ref 1.7–2.4)

## 2019-06-07 LAB — CBC
HCT: 26 % — ABNORMAL LOW (ref 39.0–52.0)
HCT: 30.1 % — ABNORMAL LOW (ref 39.0–52.0)
Hemoglobin: 8 g/dL — ABNORMAL LOW (ref 13.0–17.0)
Hemoglobin: 9.3 g/dL — ABNORMAL LOW (ref 13.0–17.0)
MCH: 25.6 pg — ABNORMAL LOW (ref 26.0–34.0)
MCH: 26 pg (ref 26.0–34.0)
MCHC: 30.8 g/dL (ref 30.0–36.0)
MCHC: 30.9 g/dL (ref 30.0–36.0)
MCV: 83.3 fL (ref 80.0–100.0)
MCV: 84.1 fL (ref 80.0–100.0)
Platelets: 332 10*3/uL (ref 150–400)
Platelets: 338 10*3/uL (ref 150–400)
RBC: 3.12 MIL/uL — ABNORMAL LOW (ref 4.22–5.81)
RBC: 3.58 MIL/uL — ABNORMAL LOW (ref 4.22–5.81)
RDW: 16.1 % — ABNORMAL HIGH (ref 11.5–15.5)
RDW: 16.1 % — ABNORMAL HIGH (ref 11.5–15.5)
WBC: 11.8 10*3/uL — ABNORMAL HIGH (ref 4.0–10.5)
WBC: 11.7 10*3/uL — ABNORMAL HIGH (ref 4.0–10.5)
nRBC: 0 % (ref 0.0–0.2)
nRBC: 0 % (ref 0.0–0.2)

## 2019-06-07 LAB — LACTATE DEHYDROGENASE: LDH: 133 U/L (ref 98–192)

## 2019-06-07 LAB — PROTIME-INR
INR: 1.2 (ref 0.8–1.2)
Prothrombin Time: 15.1 seconds (ref 11.4–15.2)

## 2019-06-07 LAB — ALBUMIN
Albumin: 2.6 g/dL — ABNORMAL LOW (ref 3.5–5.0)
Albumin: 2.6 g/dL — ABNORMAL LOW (ref 3.5–5.0)

## 2019-06-07 LAB — PROTEIN, TOTAL: Total Protein: 6.5 g/dL (ref 6.5–8.1)

## 2019-06-07 MED ORDER — MIDAZOLAM HCL 2 MG/2ML IJ SOLN
INTRAMUSCULAR | Status: AC
  Filled 2019-06-07: qty 2

## 2019-06-07 MED ORDER — TRAZODONE HCL 50 MG PO TABS
50.0000 mg | ORAL_TABLET | Freq: Once | ORAL | Status: AC
  Administered 2019-06-07: 01:00:00 50 mg via ORAL
  Filled 2019-06-07: qty 1

## 2019-06-07 MED ORDER — LIDOCAINE VISCOUS HCL 2 % MT SOLN
OROMUCOSAL | Status: AC
  Filled 2019-06-07: qty 15

## 2019-06-07 MED ORDER — MIDAZOLAM HCL 2 MG/2ML IJ SOLN
INTRAMUSCULAR | Status: AC | PRN
  Administered 2019-06-07: 0.5 mg via INTRAVENOUS

## 2019-06-07 MED ORDER — CEFAZOLIN SODIUM-DEXTROSE 1-4 GM/50ML-% IV SOLN
1.0000 g | Freq: Two times a day (BID) | INTRAVENOUS | Status: DC
  Filled 2019-06-07: qty 50

## 2019-06-07 MED ORDER — FENTANYL CITRATE (PF) 100 MCG/2ML IJ SOLN
INTRAMUSCULAR | Status: AC | PRN
  Administered 2019-06-07: 25 ug via INTRAVENOUS

## 2019-06-07 MED ORDER — LIDOCAINE HCL 1 % IJ SOLN
INTRAMUSCULAR | Status: AC
  Filled 2019-06-07: qty 20

## 2019-06-07 MED ORDER — TRAZODONE HCL 50 MG PO TABS
50.0000 mg | ORAL_TABLET | Freq: Every evening | ORAL | Status: DC | PRN
  Administered 2019-06-07: 50 mg via ORAL
  Filled 2019-06-07: qty 1

## 2019-06-07 MED ORDER — MORPHINE SULFATE (CONCENTRATE) 10 MG/0.5ML PO SOLN: 5.0000 mg | ORAL | Status: DC | PRN

## 2019-06-07 MED ORDER — METHYLPREDNISOLONE SODIUM SUCC 125 MG IJ SOLR
80.0000 mg | Freq: Three times a day (TID) | INTRAMUSCULAR | Status: DC
  Administered 2019-06-07 – 2019-06-08 (×3): 80 mg via INTRAVENOUS
  Filled 2019-06-07 (×3): qty 2

## 2019-06-07 MED ORDER — CEFAZOLIN SODIUM-DEXTROSE 2-4 GM/100ML-% IV SOLN
2.0000 g | Freq: Once | INTRAVENOUS | Status: AC
  Administered 2019-06-07: 17:00:00 2 g via INTRAVENOUS
  Filled 2019-06-07: qty 100

## 2019-06-07 MED ORDER — LIDOCAINE HCL 1 % IJ SOLN
INTRAMUSCULAR | Status: AC | PRN
  Administered 2019-06-07: 10 mL

## 2019-06-07 MED ORDER — FENTANYL CITRATE (PF) 100 MCG/2ML IJ SOLN
INTRAMUSCULAR | Status: AC
  Filled 2019-06-07: qty 2

## 2019-06-07 NOTE — Progress Notes (Addendum)
Manufacturing engineer [facility]) Hospital Liaison:  RN Note    Notified by Herb Grays CMRN, of patient/family request for [facility] services at home after discharge. Chart and patient information under review by [facility] physician. Hospice eligibility pending at this time.    Writer spoke with patient daughter via telephone to initiate education related to hospice philosophy, services and team approach to care.  Daughter verbalized understanding of information given.  Per discussion, plan is for discharge to home by [place] on 06/08/19 . Please send signed and completed DNR form home with patient/family.    Patient will need prescriptions for discharge comfort medications.  DME needs have been discussed. Patient currently has the following equipment in the home:  Walker, urinal and oxygen setup through [employer].  Daughter declines additional  DME needs at this time.  Home address has been verified and is correct in the chart in case patient family requests new DME before discharge.  [facility] aware of the above.    Please fax completed discharge summary to [facility] at [phone] when final.   Please notify [facility] when patient is ready to leave the unit at discharge. (Call [phone] between 8:30 and 5pm. Call [phone] after 5pm.)   [facility] information and contact numbers given to daughter during telephone conversation.    Above information shared with Inocente Salles, [facility].  Please call with hospice related questions.  Thank you for this referral,  Gar Ponto, RN [facility] Liaison [phone] [facility] Liaisons are listed on AMION)     Update: Patient is hospice eligible per Dr. Tomasa Hosteller of [facility]   Left phone message for [facility] per [facility] request to report eligibility. Pt daughter made aware of Hospice eligibility as well.

## 2019-06-07 NOTE — Progress Notes (Signed)
PROGRESS NOTE    Andre [name]  MWU:[id number] DOB: 05-18-1923 DOA: 06/05/2019 PCP: [name], MD   Brief Narrative:  85 y.o. male PMHx HTN, diastolic CHF, last EF 60 -65% in 02/2019, atrial fibrillation on Eliquis, complete heart block status post pacemaker, and chronic respiratory failure on 5 L nasal cannula oxygen;   Presents with complaints of shortness of breath.  Patient admitted on 6/21 and had just been discharged from the hospital 6/24 for acute on chronic respiratory failure secondary to congestive heart failure exacerbation with right-sided pleural effusion.  During his hospital stay he was evaluated by PCCM who performed thoracentesis taking off 1670 ml of bloody fluid on 6/22.  The fluid was exudative with reactive mesothelial cells.  After getting home, he reported getting up to walk around and feeling very short of breath with any exertion.  He was going to use the restroom while using his walker when he reports that his legs gave out.  He states that he would have fallen at the nursing aide had not been present.  Associated times included intermittent chronic dry cough, lower extremities swelling, easy bruisability.  Denies having any lightheadedness, nausea, vomiting, abdominal pain, loss of consciousness, or trauma to his head.   ED Course: Upon admission into the emergency department patient was noted to be afebrile, blood pressure 91/48 with improved to 151/52 with 500 ml of IV fluids, and O2 saturations 92-100% with 4 L nasal cannula oxygen.  Labs revealed WBC 14, hemoglobin 8.6, sodium 133 BUN 39, creatinine 2.14(previously 1.81 this a.m), and lactic acid 1.8.  Chest x-ray showing moderate right-sided pleural effusion.  TRH called to admit.  Accepted to a progressive bed    Subjective: 6/26/O x4, negative CP, positive acute on chronic S OB, negative abdominal pain.   Assessment & Plan:   Principal Problem:   Acute on chronic respiratory failure with hypoxia  (HCC) Active Problems:   Hyperlipidemia   Acute-on-chronic kidney disease stage III   Atrial fibrillation/flutter   Pacemaker   Chronic diastolic heart failure (HCC)   Recurrent pleural effusion on right   Hypotension  Acute on chronic respiratory failure with hypoxia/emphysema - Patient discharged on 6/24 with diagnosis of interstitial lung disease/emphysema by CT scan complicated by right sided loculated pleural effusion.  At that time 1672ml bloody fluid aspirated. -Per patient and daughter not informed of seriousness of his lung disease. - Patient returns with recurrent right-sided pleural effusion and S OB. - Understands secondary to his multiple organ failures i.e. CHF, respiratory, kidney that he will have recurrent effusions in the future.  Has elected to go home on hospice. - 6/26 scheduled for RIGHT Pleurx cath placement -6/26 RIGHT recurrent pleural effusion labs pending -Titrate O2 to maintain SPO2> 93% - Solu-Medrol 80 mg 3 times daily: We will try short course, but given patient's current progression of disease most likely will not see any benefit   Interstitial lung disease - See respiratory failure  Recurrent right-sided pleural effusion/Loculated pleural effusion - See respiratory failure -6/26 RIGHT Pleurx cath placed: 1.4 L pleural fluid removed  Complete heart block/Chronic Diastolic CHF - S/p permanent pacemaker - EF 60 to 65%, March 2020 - Strict in and out -4ml -Daily weight Filed Weights   06/06/19 0115 06/07/19 0613  Weight: 57.1 kg 55.4 kg  -On previous discharge patient's Lasix was increased to 40 mg daily from 20 mg daily unfortunately was ineffective treatment for his recurrent pleural effusion secondary to hypoalbuminemia (albumin= 2.0) -Hold  home BP medication  Chronic atrial fibrillation -CHA2DS2-VASc score =at least 4. - Currently paced NSR - Eliquis on hold for procedure  Hypotension - Multifactorial space overdiuresis, hypoalbuminemia,  CHF, underlying lung disease..  Correct underlying factors - Albumin 50 g x 1 administered in unit - Normal saline 500 ml administered in ED - Patient's BP stabilized.  Patient and daughter understand this is only temporary given his tenuous overall condition. - Hold all hypertensive meds  Acute on CKD stage III (baseline Cr 1.7) -Normally would consider additional fluids however given patient's poor albumin level any significant addition of fluids would end up in his chest cavity. - Monitor closely Recent Labs  Lab 06/03/19 0237 06/04/19 0427 06/05/19 2128 06/06/19 0545 06/07/19 0412  CREATININE 1.70* 1.81* 2.14* 2.01* 1.63*  - At baseline    Leukocytosis acute on chronic - Reactive vs low-grade infection vs malignancy -Continue to monitor     Anemia (baseline HgB 12 in March 2020)  -Anemia panel pending -Fecal occult pending -Bloody fluid aspirated on thoracentesis 6/22. -No overt signs of bleeding during this admission.   Recent Labs  Lab 06/04/19 0427 06/05/19 0427 06/05/19 2128 06/06/19 0545 06/07/19 0412  HGB 9.2* 8.9* 8.6* 9.4* 8.0*   Hyponatremia -Secondary to CHF and overdiuresis - Monitor closely Recent Labs  Lab 06/03/19 0237 06/04/19 0427 06/05/19 2128 06/06/19 0545 06/07/19 0412  NA 137 134* 133* 135 139  -Resolved   Hypokalemia - Potassium goal> 4 - Potassium p.o. 50 mEq  Hypoalbunemia - 6/25 Albumin 50 g - Repeat albumin level daily x3 days   Hyperlipidemia -Continue atorvastatin  Generalized muscle weakness -Multifactorial CHF, lung disease, anemia.  We will correct underlying issues as much as feasible. -See goals of care.    Goals of care - 6/24 palliative care:Spoke at length with patient and daughter they understand that he is in the terminal phase of his life and would like to pursue hospice.  Have requested placement of Pleurx cath in order to allow patient to stay out of the hospital and spend as much time as possible with  family.  Evaluate patient for home services.  Please ensure daughter is contacted and kept in the loop.    DVT prophylaxis: Heparin drip Code Status: DNR Family Communication: Spoke with daughter [name] at length and discussed patient's wish to go hospice route and have Pleurx cath placed Disposition Plan: Home with hospice   Consultants:  Sheral Apley     Procedures/Significant Events:  6/22 CT chest  Wo contrast:1. Moderate partially loculated right pleural effusion which measures simple fluid density, no CT findings to suggest hemorrhage. Associated compressive atelectasis. 2. Emphysema with diffuse interstitial coarsening, bronchiectasis, possible basilar honeycombing. Findings suggest underlying interstitial lung disease. 3. Subpleural 5 mm left upper lobe pulmonary nodule, more conspicuous than on prior exam. Recommend follow-up CT in 1 year, giving consideration for patient's advanced age. 4. Right anterior epicardial rounded soft tissue density is stable from exam 2 months ago, indeterminate. 6/22 RIGHT thoracentesis: 1659ml  bloody exudate aspirated.  Note did not fully resolve effusion secondary to partially loculated RIGHT effusion. --------------------------------------------------------------------------------------------------------------- 6/26 RIGHT Pleurx cath placed: 1.4 L pleural fluid removed    I have personally reviewed and interpreted all radiology studies and my findings are as above.  VENTILATOR SETTINGS:    Cultures   Antimicrobials:    Devices    LINES / TUBES:  Permanent pacer left side of chest wall>>    Continuous Infusions:  Objective: Vitals:   06/07/19 0307 06/07/19 0613 06/07/19 0736 06/07/19 1037  BP: (!) 124/44   (!) 117/51  Pulse: 85     Resp: (!) 23     Temp: 97.8 F (36.6 C)  (!) 97.5 F (36.4 C) (!) 97.4 F (36.3 C)  TempSrc: Oral  Oral Axillary  SpO2: 96%   90%  Weight:  55.4 kg    Height:         Intake/Output Summary (Last 24 hours) at 06/07/2019 1121 Last data filed at 06/07/2019 1039 Gross per 24 hour  Intake 519 ml  Output 1250 ml  Net -731 ml   Filed Weights   06/06/19 0115 06/07/19 7782  Weight: 57.1 kg 55.4 kg   Physical Exam:  General: A/O x4 positive acute on chronic respiratory distress Eyes: negative scleral hemorrhage, negative anisocoria, negative icterus ENT: Negative Runny nose, negative gingival bleeding, Neck:  Negative scars, masses, torticollis, lymphadenopathy, JVD Lungs: LEFT lung fields clear to auscultation, RIGHT lung fields absent breath sounds, without wheezes or crackles Cardiovascular: Paced regular rate and rhythm without murmur gallop or rub normal S1 and S2 Abdomen: negative abdominal pain, nondistended, positive soft, bowel sounds, no rebound, no ascites, no appreciable mass Extremities: No significant cyanosis, clubbing, or edema bilateral lower extremities Skin: Negative rashes, lesions, ulcers Psychiatric:  Negative depression, negative anxiety, negative fatigue, negative mania  Central nervous system:  Cranial nerves II through XII intact, tongue/uvula midline, all extremities muscle strength 5/5, sensation intact throughout,  negative dysarthria, negative expressive aphasia, negative receptive aphasia.      Data Reviewed: Care during the described time interval was provided by me .  I have reviewed this patient's available data, including medical history, events of note, physical examination, and all test results as part of my evaluation.   CBC: Recent Labs  Lab 06/02/19 1705 06/03/19 0237  06/04/19 0427 06/05/19 0427 06/05/19 2128 06/06/19 0545 06/07/19 0412  WBC 14.1* 13.2*   < > 12.9* 12.0* 14.0* 12.0* 11.8*  NEUTROABS 11.1* 9.4*  --  8.4*  --  11.7*  --   --   HGB 8.5* 9.3*   < > 9.2* 8.9* 8.6* 9.4* 8.0*  HCT 27.8* 30.3*   < > 29.2* 28.5* 27.6* 29.6* 26.0*  MCV 86.6 84.6   < > 83.4 82.1 83.9 82.0 83.3  PLT 497* 436*   < >  404* 352 361 350 332   < > = values in this interval not displayed.   Basic Metabolic Panel: Recent Labs  Lab 06/02/19 2327 06/03/19 0237 06/04/19 0427 06/05/19 2128 06/06/19 0545 06/07/19 0412  NA  --  137 134* 133* 135 139  K  --  3.7 3.4* 3.8 3.4* 4.1  CL  --  102 96* 95* 96* 102  CO2  --  25 27 26 28 25   GLUCOSE  --  118* 99 142* 112* 96  BUN  --  28* 32* 39* 36* 30*  CREATININE  --  1.70* 1.81* 2.14* 2.01* 1.63*  CALCIUM  --  8.6* 8.5* 8.4* 8.6* 8.7*  MG 2.0  --   --   --  1.9 1.9  PHOS  --   --  4.8*  --   --   --    GFR: Estimated Creatinine Clearance: 22.2 mL/min (A) (by C-G formula based on SCr of 1.63 mg/dL (H)). Liver Function Tests: Recent Labs  Lab 06/02/19 1705 06/03/19 0237 06/04/19 0427 06/07/19 0412  AST 33 11*  --   --  ALT 18 14  --   --   ALKPHOS 63 62  --   --   BILITOT 1.1 0.3  --   --   PROT 6.3* 6.2*  --   --   ALBUMIN 2.3* 2.2* 2.0* 2.6*   No results for input(s): LIPASE, AMYLASE in the last 168 hours. No results for input(s): AMMONIA in the last 168 hours. Coagulation Profile: Recent Labs  Lab 06/06/19 0545 06/07/19 0412  INR 1.3* 1.2   Cardiac Enzymes: Recent Labs  Lab 06/02/19 1705 06/02/19 2327 06/03/19 0237 06/03/19 1500  TROPONINI 0.04* 0.04* 0.04* 0.04*   BNP (last 3 results) No results for input(s): PROBNP in the last 8760 hours. HbA1C: No results for input(s): HGBA1C in the last 72 hours. CBG: No results for input(s): GLUCAP in the last 168 hours. Lipid Profile: No results for input(s): CHOL, HDL, LDLCALC, TRIG, CHOLHDL, LDLDIRECT in the last 72 hours. Thyroid Function Tests: No results for input(s): TSH, T4TOTAL, FREET4, T3FREE, THYROIDAB in the last 72 hours. Anemia Panel: No results for input(s): VITAMINB12, FOLATE, FERRITIN, TIBC, IRON, RETICCTPCT in the last 72 hours. Urine analysis:    Component Value Date/Time   COLORURINE YELLOW 06/06/2019 0119   APPEARANCEUR CLEAR 06/06/2019 0119   LABSPEC 1.009  06/06/2019 0119   PHURINE 6.0 06/06/2019 0119   GLUCOSEU NEGATIVE 06/06/2019 0119   HGBUR NEGATIVE 06/06/2019 0119   BILIRUBINUR NEGATIVE 06/06/2019 0119   BILIRUBINUR Negative 12/11/2018 0947   KETONESUR NEGATIVE 06/06/2019 0119   PROTEINUR 30 (A) 06/06/2019 0119   UROBILINOGEN 0.2 12/11/2018 0947   UROBILINOGEN 0.2 12/08/2010 0639   NITRITE NEGATIVE 06/06/2019 0119   LEUKOCYTESUR NEGATIVE 06/06/2019 0119   Sepsis Labs: @LABRCNTIP (procalcitonin:4,lacticidven:4)  ) Recent Results (from the past 240 hour(s))  SARS Coronavirus 2 (CEPHEID- Performed in [place] hospital lab), Hosp Order     Status: None   Collection Time: 06/02/19  5:05 PM   Specimen: Nasopharyngeal Swab  Result Value Ref Range Status   SARS Coronavirus 2 NEGATIVE NEGATIVE Final    Comment: (NOTE) If result is NEGATIVE SARS-CoV-2 target nucleic acids are NOT DETECTED. The SARS-CoV-2 RNA is generally detectable in upper and lower  respiratory specimens during the acute phase of infection. The lowest  concentration of SARS-CoV-2 viral copies this assay can detect is 250  copies / mL. A negative result does not preclude SARS-CoV-2 infection  and should not be used as the sole basis for treatment or other  patient management decisions.  A negative result may occur with  improper specimen collection / handling, submission of specimen other  than nasopharyngeal swab, presence of viral mutation(s) within the  areas targeted by this assay, and inadequate number of viral copies  (<250 copies / mL). A negative result must be combined with clinical  observations, patient history, and epidemiological information. If result is POSITIVE SARS-CoV-2 target nucleic acids are DETECTED. The SARS-CoV-2 RNA is generally detectable in upper and lower  respiratory specimens dur ing the acute phase of infection.  Positive  results are indicative of active infection with SARS-CoV-2.  Clinical  correlation with patient history and  other diagnostic information is  necessary to determine patient infection status.  Positive results do  not rule out bacterial infection or co-infection with other viruses. If result is PRESUMPTIVE POSTIVE SARS-CoV-2 nucleic acids MAY BE PRESENT.   A presumptive positive result was obtained on the submitted specimen  and confirmed on repeat testing.  While 2019 novel coronavirus  (SARS-CoV-2) nucleic acids may  be present in the submitted sample  additional confirmatory testing may be necessary for epidemiological  and / or clinical management purposes  to differentiate between  SARS-CoV-2 and other Sarbecovirus currently known to infect humans.  If clinically indicated additional testing with an alternate test  methodology [phone]) is advised. The SARS-CoV-2 RNA is generally  detectable in upper and lower respiratory sp ecimens during the acute  phase of infection. The expected result is Negative. Fact Sheet for Patients:  StrictlyIdeas.no Fact Sheet for Healthcare Providers: BankingDealers.co.za This test is not yet approved or cleared by the [place] FDA and has been authorized for detection and/or diagnosis of SARS-CoV-2 by FDA under an Emergency Use Authorization (EUA).  This EUA will remain in effect (meaning this test can be used) for the duration of the COVID-19 declaration under Section 564(b)(1) of the Act, 21 U.S.C. section 360bbb-3(b)(1), unless the authorization is terminated or revoked sooner. Performed at [facility], [place] [address], [place], [place] [address]   Body fluid culture (includes gram stain)     Status: None   Collection Time: 06/03/19  6:03 AM   Specimen: Pleural Fluid  Result Value Ref Range Status   Specimen Description PLEURAL RIGHT  Final   Special Requests NONE  Final   Gram Stain   Final    MODERATE WBC PRESENT,BOTH PMN AND MONONUCLEAR NO ORGANISMS SEEN    Culture   Final    NO GROWTH 3  DAYS Performed at [facility], [place] [address] [place], [place] [address]    Report Status 06/06/2019 FINAL  Final  MRSA PCR Screening     Status: None   Collection Time: 06/03/19  4:34 PM   Specimen: Nasopharyngeal  Result Value Ref Range Status   MRSA by PCR NEGATIVE NEGATIVE Final    Comment:        The GeneXpert MRSA Assay (FDA approved for NASAL specimens only), is one component of a comprehensive MRSA colonization surveillance program. It is not intended to diagnose MRSA infection nor to guide or monitor treatment for MRSA infections. Performed at [facility], [place] [address] [place], [place] [address]   Blood culture (routine x 2)     Status: None (Preliminary result)   Collection Time: 06/05/19  8:34 PM   Specimen: BLOOD  Result Value Ref Range Status   Specimen Description BLOOD LEFT ANTECUBITAL  Final   Special Requests   Final    BOTTLES DRAWN AEROBIC AND ANAEROBIC Blood Culture adequate volume   Culture   Final    NO GROWTH 2 DAYS Performed at [facility], [place] [address], [place], [place] [address]    Report Status PENDING  Incomplete  Blood culture (routine x 2)     Status: None (Preliminary result)   Collection Time: 06/05/19  9:58 PM   Specimen: BLOOD RIGHT HAND  Result Value Ref Range Status   Specimen Description BLOOD RIGHT HAND  Final   Special Requests   Final    BOTTLES DRAWN AEROBIC AND ANAEROBIC Blood Culture results may not be optimal due to an inadequate volume of blood received in culture bottles   Culture   Final    NO GROWTH 2 DAYS Performed at [facility], [place] [address] [place], [place] [address]    Report Status PENDING  Incomplete  SARS Coronavirus 2 (CEPHEID - Performed in [place] hospital lab), [facility] Order     Status: None   Collection  Time: 06/06/19 12:57 AM   Specimen: Nasopharyngeal Swab  Result Value Ref Range Status   SARS Coronavirus 2 NEGATIVE NEGATIVE Final    Comment: (NOTE) If result is  NEGATIVE SARS-CoV-2 target nucleic acids are NOT DETECTED. The SARS-CoV-2 RNA is generally detectable in upper and lower  respiratory specimens during the acute phase of infection. The lowest  concentration of SARS-CoV-2 viral copies this assay can detect is 250  copies / mL. A negative result does not preclude SARS-CoV-2 infection  and should not be used as the sole basis for treatment or other  patient management decisions.  A negative result may occur with  improper specimen collection / handling, submission of specimen other  than nasopharyngeal swab, presence of viral mutation(s) within the  areas targeted by this assay, and inadequate number of viral copies  (<250 copies / mL). A negative result must be combined with clinical  observations, patient history, and epidemiological information. If result is POSITIVE SARS-CoV-2 target nucleic acids are DETECTED. The SARS-CoV-2 RNA is generally detectable in upper and lower  respiratory specimens dur ing the acute phase of infection.  Positive  results are indicative of active infection with SARS-CoV-2.  Clinical  correlation with patient history and other diagnostic information is  necessary to determine patient infection status.  Positive results do  not rule out bacterial infection or co-infection with other viruses. If result is PRESUMPTIVE POSTIVE SARS-CoV-2 nucleic acids MAY BE PRESENT.   A presumptive positive result was obtained on the submitted specimen  and confirmed on repeat testing.  While 2019 novel coronavirus  (SARS-CoV-2) nucleic acids may be present in the submitted sample  additional confirmatory testing may be necessary for epidemiological  and / or clinical management purposes  to differentiate between  SARS-CoV-2 and other Sarbecovirus currently known to infect humans.  If clinically indicated additional testing with an alternate test  methodology [phone]) is advised. The SARS-CoV-2 RNA is generally  detectable  in upper and lower respiratory sp ecimens during the acute  phase of infection. The expected result is Negative. Fact Sheet for Patients:  StrictlyIdeas.no Fact Sheet for Healthcare Providers: BankingDealers.co.za This test is not yet approved or cleared by the [place] FDA and has been authorized for detection and/or diagnosis of SARS-CoV-2 by FDA under an Emergency Use Authorization (EUA).  This EUA will remain in effect (meaning this test can be used) for the duration of the COVID-19 declaration under Section 564(b)(1) of the Act, 21 U.S.C. section 360bbb-3(b)(1), unless the authorization is terminated or revoked sooner. Performed at [facility], [place] [address], [place], [place] [address]          Radiology Studies: Dg Chest 2 View  Result Date: 06/05/2019 CLINICAL DATA:  85 year old male with shortness of breath. EXAM: CHEST - 2 VIEW COMPARISON:  Chest CT dated 06/03/2019 FINDINGS: There is emphysema and chronic interstitial coarsening. Moderate right pleural effusion and associated atelectatic changes. No pneumothorax. The cardiac silhouette is within normal limits. Left pectoral pacemaker device. Osteopenia with degenerative changes of the spine. No acute osseous pathology. IMPRESSION: 1. Moderate right pleural effusion and associated atelectatic changes similar to the prior CT. 2. Emphysema. Electronically Signed   By: Anner Crete M.D.   On: 06/05/2019 21:22        Scheduled Meds: . atorvastatin  10 mg Oral Daily   Continuous Infusions:    LOS: 1 day   The patient is critically ill with multiple organ systems failure and requires high complexity  decision making for assessment and support, frequent evaluation and titration of therapies, application of advanced monitoring technologies and extensive interpretation of multiple databases. Critical Care Time devoted to patient care services described in this note   Time spent: 40 minutes     Hasaan Radde, Geraldo Docker, MD [facility] Pager [phone]  If 7PM-7AM, please contact night-coverage [email or web address] Password [facility] 06/07/2019, 11:21 AM

## 2019-06-07 NOTE — Progress Notes (Signed)
Physical Therapy Treatment Patient Details Name: Andre [name] MRN: [id number] DOB: 12-Feb-1923 Today's Date: 06/07/2019    History of Present Illness  Andre [name] is a 85 y.o. male with medical history significant of hypertension, diastolic CHF last EF 60 -10% in 02/2019, atrial fibrillation on Eliquis, complete heart block status post pacemaker, and chronic respiratory failure on 5 L nasal cannula oxygen; who presents with complaints of shortness of breath.  Patient had just been discharged from the hospital today for acute on chronic respiratory failure secondary to congestive heart failure exacerbation with right-sided pleural effusion.  During his hospital stay he was evaluated by PCCM who performed thoracentesis taking off 1670 ml of bloody fluid on 6/22.      PT Comments    Pt eager to participate, but is fatigued and dyspneic from the beginning.  Pt stood for peri-care post having a stool, ambulated with RW in the halls, stopping to regroup from falling sats.  On 6L [place], sats dropping to 77% at EHR in the low 90's.  Recovery of sats to 90% in about 1.5 min.  Pt awaits thoracentesis this afternoon.    Follow Up Recommendations  Home health PT;Supervision/Assistance - 24 hour     Equipment Recommendations  None recommended by PT    Recommendations for Other Services       Precautions / Restrictions Precautions Precautions: Fall Precaution Comments: monitor O2 sats    Mobility  Bed Mobility Overal bed mobility: Needs Assistance Bed Mobility: Supine to Sit;Sit to Supine     Supine to sit: Supervision Sit to supine: Supervision      Transfers Overall transfer level: Needs assistance Equipment used: Rolling walker (2 wheeled) Transfers: Sit to/from Stand Sit to Stand: Min guard Stand pivot transfers: Min guard       General transfer comment: cues for hand placement.  Today min guard  Ambulation/Gait Ambulation/Gait assistance: Min assist Gait Distance  (Feet): 84 Feet Assistive device: Rolling walker (2 wheeled) Gait Pattern/deviations: Step-through pattern     General Gait Details: pt's gait slower, not as impulsive, better approximated to RW, but sats dropping faster to upper 70% on 6L .  pt much more dyspneic, EHR in the 80's and low 90's.  Sats recovered to 90% in 1.5 min.   Stairs             Wheelchair Mobility    Modified Rankin (Stroke Patients Only)       Balance Overall balance assessment: Needs assistance   Sitting balance-Leahy Scale: Fair(to good)     Standing balance support: Bilateral upper extremity supported;Single extremity supported Standing balance-Leahy Scale: Poor Standing balance comment: stood in RW for pericare/hygiene x2 for 20-25 sec.                            Cognition Arousal/Alertness: Awake/alert Behavior During Therapy: WFL for tasks assessed/performed Overall Cognitive Status: Within Functional Limits for tasks assessed                                        Exercises      General Comments        Pertinent Vitals/Pain Pain Assessment: No/denies pain    Home Living                      Prior Function  PT Goals (current goals can now be found in the care plan section) Acute Rehab PT Goals Patient Stated Goal: get my breath under control and walk more. PT Goal Formulation: With patient Time For Goal Achievement: 06/20/19 Potential to Achieve Goals: Good Progress towards PT goals: Progressing toward goals    Frequency    Min 3X/week      PT Plan Current plan remains appropriate    Co-evaluation              AM-PAC PT "6 Clicks" Mobility   Outcome Measure  Help needed turning from your back to your side while in a flat bed without using bedrails?: None Help needed moving from lying on your back to sitting on the side of a flat bed without using bedrails?: None Help needed moving to and from a bed to a  chair (including a wheelchair)?: A Little Help needed standing up from a chair using your arms (e.g., wheelchair or bedside chair)?: A Little Help needed to walk in hospital room?: A Little Help needed climbing 3-5 steps with a railing? : A Lot 6 Click Score: 19    End of Session Equipment Utilized During Treatment: Oxygen Activity Tolerance: Patient limited by fatigue Patient left: in bed;with call bell/phone within reach Nurse Communication: Mobility status PT Visit Diagnosis: Unsteadiness on feet (R26.81);Muscle weakness (generalized) (M62.81);Difficulty in walking, not elsewhere classified (R26.2)     Time: 1437-1500 PT Time Calculation (min) (ACUTE ONLY): 23 min  Charges:  $Gait Training: 8-22 mins $Therapeutic Activity: 8-22 mins                     06/07/2019  Andre [name], PT Acute Rehabilitation Services [phone]  (pager) [phone]  (office)   Andre [name] 06/07/2019, 3:07 PM

## 2019-06-07 NOTE — Telephone Encounter (Signed)
Copied from [place] [id number]. Topic: General - Other >> Jun 07, 2019  2:34 PM Lennox Solders wrote: Reason for CRM: trina with [facility] is calling to see if dr hunter will be attending of records for hospice care. Pt will be discharge [facility] tomorrow

## 2019-06-07 NOTE — Consult Note (Signed)
Chief Complaint: Patient was seen in consultation today for right pleural effusion  Referring Physician(s): Dr. Dia Crawford  Supervising Physician: Aletta Edouard  Patient Status: [facility] - In-pt  History of Present Illness: Andre [name] is a 85 y.o. male with past medical history of chronic a fib on Eliquis for complete heart block, HTN, HLD, and congestive heart failure with recent development of shortness of breath.  He is s/p right-sided thoracentesis 6/22 but has had rapid reaccumulation of fluid. Patient and daughter have discussed goals of care in setting of progressive chronic disease and advanced age.  They would like to pursue hospice care with PleurX catheter placement for symptom management of recurrent right pleural effusion. Case reviewed by Dr. Kathlene Cote who approves patient for for procedure.    Past Medical History:  Diagnosis Date   Acute on chronic respiratory failure with hypoxia ([place])    Adenomatous colon polyp 04/1985   no further colonoscopy, 2008-last colonoscopy, no polyps     Atrial fibrillation (HCC)    Chronic atrial fibrillation    Chronic diastolic heart failure (HCC)    Diverticulosis    History of skin cancer    dermatology every 6 months Dr. [name]   Hyperlipidemia    Hypertension    Lobar pneumonia, unspecified organism ([place])    Macular degeneration    bilateral   Nephrolithiasis    PAF (paroxysmal atrial fibrillation) (HCC)    PAT (paroxysmal atrial tachycardia) ([place])    many years ago, worse with smoking   Severe sepsis [facility])     Past Surgical History:  Procedure Laterality Date   CARDIOVERSION N/A 05/23/2016   Procedure: CARDIOVERSION;  Surgeon: [name], MD;  Location: [place];  Service: Cardiovascular;  Laterality: N/A;   CARDIOVERSION N/A 03/01/2019   Procedure: CARDIOVERSION;  Surgeon: Lelon Perla, MD;  Location: [place];  Service: Cardiovascular;  Laterality: N/A;   CATARACT  EXTRACTION     bilateral   INGUINAL HERNIA REPAIR     PACEMAKER IMPLANT N/A 02/24/2019   Procedure: PACEMAKER IMPLANT;  Surgeon: Deboraha Sprang, MD;  Location: [place] CV LAB;  Service: Cardiovascular;  Laterality: N/A;   TEE WITHOUT CARDIOVERSION N/A 03/22/2016   Procedure: TRANSESOPHAGEAL ECHOCARDIOGRAM (TEE);  Surgeon: [name], MD;  Location: [place];  Service: Cardiovascular;  Laterality: N/A;   TEE WITHOUT CARDIOVERSION N/A 05/23/2016   Procedure: TRANSESOPHAGEAL ECHOCARDIOGRAM (TEE);  Surgeon: [name], MD;  Location: [facility] ENDOSCOPY;  Service: Cardiovascular;  Laterality: N/A;   TEE WITHOUT CARDIOVERSION N/A 03/01/2019   Procedure: TRANSESOPHAGEAL ECHOCARDIOGRAM (TEE);  Surgeon: Lelon Perla, MD;  Location: [facility] ENDOSCOPY;  Service: Cardiovascular;  Laterality: N/A;   TENDON REPAIR  12/11   right leg    Allergies: Patient has no known allergies.  Medications: Prior to Admission medications   Medication Sig Start Date End Date Taking? Authorizing Provider  albuterol (VENTOLIN HFA) 108 (90 Base) MCG/ACT inhaler Inhale 1 puff into the lungs every 4 (four) hours as needed for wheezing or shortness of breath. 05/21/19   Shelly Coss, MD  amiodarone (PACERONE) 200 MG tablet Take 1 tablet (200 mg total) by mouth daily. 05/24/19   Lelon Perla, MD  amLODipine (NORVASC) 5 MG tablet Take 1 tablet (5 mg total) by mouth daily. 05/27/19   [name], MD  apixaban (ELIQUIS) 2.5 MG TABS tablet Take 1 tablet (2.5 mg total) by mouth 2 (two) times daily. Patient taking differently: Take 2.5 mg by mouth at bedtime.  05/27/19   [name], MD  atorvastatin (LIPITOR) 10 MG tablet Take 1 tablet (10 mg total) by mouth daily. 05/27/19   [name], MD  furosemide (LASIX) 40 MG tablet Take 1 tablet (40 mg total) by mouth 2 (two) times daily. Patient taking differently: Take 20 mg by mouth 2 (two) times daily.  05/31/19 05/25/20  Deboraha Sprang, MD    losartan-hydrochlorothiazide (HYZAAR) 100-25 MG tablet Take 1 tablet by mouth daily. 05/27/19   [name], MD  Potassium Chloride ER 20 MEQ TBCR Take 20 mEq by mouth daily as needed (as long as taking lasix). Patient taking differently: Take 20 mEq by mouth daily.  05/27/19   [name], MD     Family History  Problem Relation Age of Onset   Hypertension Mother    Cancer Father        lung    Social History   Socioeconomic History   Marital status: Married    Spouse name: Not on file   Number of children: 5   Years of education: Not on file   Highest education level: Not on file  Occupational History   Occupation: RETIRED    Employer: RETIRED  Social Designer, fashion/clothing strain: Not on file   Food insecurity    Worry: Not on file    Inability: Not on file   Transportation needs    Medical: Not on file    Non-medical: Not on file  Tobacco Use   Smoking status: Former Smoker    Packs/day: 1.00    Years: 60.00    Pack years: 60.00    Types: Cigarettes    Quit date: 12/13/2003    Years since quitting: 15.4   Smokeless tobacco: Never Used  Substance and Sexual Activity   Alcohol use: Yes    Alcohol/week: 0.0 standard drinks    Comment: 2 glass of wine a day   Drug use: No   Sexual activity: Not on file  Lifestyle   Physical activity    Days per week: Not on file    Minutes per session: Not on file   Stress: Not on file  Relationships   Social connections    Talks on phone: Not on file    Gets together: Not on file    Attends religious service: Not on file    Active member of club or organization: Not on file    Attends meetings of clubs or organizations: Not on file    Relationship status: Not on file  Other Topics Concern   Not on file  Social History Narrative   Married (64 years in 08/2015-goes to outside practice). 5 children. 8 grandchildren (lost 1 grandchild #9 to [place] 18,  Lost #9 to seizures)      Retired at  [employer], high Cabin crew      Hobbies: time with family, go to beach (51 years in a row), travel     Review of Systems: A 12 point ROS discussed and pertinent positives are indicated in the HPI above.  All other systems are negative.  Review of Systems  Constitutional: Negative for fatigue and fever.  Respiratory: Negative for cough and shortness of breath.   Cardiovascular: Negative for chest pain.  Gastrointestinal: Negative for abdominal pain.  Musculoskeletal: Negative for back pain.  Psychiatric/Behavioral: Negative for behavioral problems and confusion.    Vital Signs: BP (!) 117/51 (BP Location: Right Arm)    Pulse 85  Temp (!) 97.4 F (36.3 C) (Axillary)    Resp (!) 23    Ht 5\' 5"  (1.651 m)    Wt 122 lb 2.2 oz (55.4 kg)    SpO2 90%    BMI 20.32 kg/m   Physical Exam Vitals signs and nursing note reviewed.  Constitutional:      Appearance: He is well-developed.  Cardiovascular:     Rate and Rhythm: Normal rate and regular rhythm.  Pulmonary:     Effort: Pulmonary effort is normal. No tachypnea.     Breath sounds: Normal breath sounds.  Skin:    General: Skin is warm and dry.  Neurological:     General: No focal deficit present.     Mental Status: He is alert and oriented to person, place, and time.  Psychiatric:        Mood and Affect: Mood normal. Mood is not anxious.        Behavior: Behavior normal. Behavior is not agitated.      MD Evaluation Airway: WNL Heart: WNL Abdomen: WNL Chest/ Lungs: WNL ASA  Classification: 3 Mallampati/Airway Score: One   Imaging: Dg Chest 2 View  Result Date: 06/05/2019 CLINICAL DATA:  85 year old male with shortness of breath. EXAM: CHEST - 2 VIEW COMPARISON:  Chest CT dated 06/03/2019 FINDINGS: There is emphysema and chronic interstitial coarsening. Moderate right pleural effusion and associated atelectatic changes. No pneumothorax. The cardiac silhouette is within normal limits. Left pectoral pacemaker device.  Osteopenia with degenerative changes of the spine. No acute osseous pathology. IMPRESSION: 1. Moderate right pleural effusion and associated atelectatic changes similar to the prior CT. 2. Emphysema. Electronically Signed   By: Anner Crete M.D.   On: 06/05/2019 21:22   Ct Chest Wo Contrast  Result Date: 06/04/2019 CLINICAL DATA:  Linear pleural effusion after thoracentesis. EXAM: CT CHEST WITHOUT CONTRAST TECHNIQUE: Multidetector CT imaging of the chest was performed following the standard protocol without IV contrast. COMPARISON:  Chest radiograph earlier this day.  Chest CT 04/01/2019 FINDINGS: Cardiovascular: Dense aortic atherosclerosis. No aneurysm. Left-sided pacemaker in place is leads in the right atrium and ventricle. Mild cardiomegaly. Coronary artery calcifications. Mediastinum/Nodes: No enlarged mediastinal lymph nodes. Limited assessment for hilar adenopathy given lack of IV contrast. Right hilar calcifications likely calcified nodes. Unchanged 3.4 cm right thyroid nodule. Esophagus decompressed. Unchanged right anterior epicardial well-defined round mass lesion measuring 3.9 x 2.3 cm. Lungs/Pleura: Emphysema. Moderate right pleural effusion is partially loculated anterior laterally and medial about the mediastinum. Fluid measures simple fluid density without CT findings to suggest hemorrhage. There is adjacent compressive atelectasis. Mild right middle lobe bronchiectasis minimal left pleural thickening without effusion. Diffuse coarse interstitial markings throughout the left lung. Subpleural nodule in the anterior left upper lobe measuring 5 mm, image 57 series 5, more conspicuous than on prior exam. Mild left lower lobe bronchiectasis with mild subpleural reticulation, possible honeycombing. Trachea and mainstem bronchi are patent. No pneumothorax. Scattered calcified granuloma. Upper Abdomen: Right anterior epicardial lesion as described. No acute upper abdominal finding. Musculoskeletal:  Chronic lower thoracic compression fracture. There are no acute or suspicious osseous abnormalities. IMPRESSION: 1. Moderate partially loculated right pleural effusion which measures simple fluid density, no CT findings to suggest hemorrhage. Associated compressive atelectasis. 2. Emphysema with diffuse interstitial coarsening, bronchiectasis, possible basilar honeycombing. Findings suggest underlying interstitial lung disease. 3. Subpleural 5 mm left upper lobe pulmonary nodule, more conspicuous than on prior exam. Recommend follow-up CT in 1 year, giving consideration for patient's advanced age. 4.  Right anterior epicardial rounded soft tissue density is stable from exam 2 months ago, indeterminate. Aortic Atherosclerosis (ICD10-I70.0) and Emphysema (ICD10-J43.9). Electronically Signed   By: Keith Rake M.D.   On: 06/04/2019 01:49   Dg Chest Port 1 View  Result Date: 06/03/2019 CLINICAL DATA:  Pleural effusion on the right EXAM: PORTABLE CHEST 1 VIEW COMPARISON:  Yesterday FINDINGS: Diminished right pleural effusion with residual potentially loculated laterally. Diffuse interstitial and airspace opacity. No pneumothorax. Normal heart size. Dual-chamber pacer leads from the right. IMPRESSION: No complicating feature after right thoracentesis. Pleural fluid has decreased to small volume. Electronically Signed   By: Monte Fantasia M.D.   On: 06/03/2019 07:00   Dg Chest Portable 1 View  Result Date: 06/02/2019 CLINICAL DATA:  Acute shortness of breath EXAM: PORTABLE CHEST 1 VIEW COMPARISON:  05/15/2019 FINDINGS: An enlarging RIGHT pleural effusion is noted, now moderate to large. Cardiomegaly and interstitial pulmonary edema again noted. Compressive RIGHT LOWER lung atelectasis noted. LEFT pacemaker again noted. No pneumothorax or acute bony abnormality. IMPRESSION: Enlarging RIGHT pleural effusion, now moderate to large. Interstitial pulmonary edema again noted. Electronically Signed   By: Margarette Canada  M.D.   On: 06/02/2019 17:20   Dg Chest Port 1 View  Result Date: 05/15/2019 CLINICAL DATA:  Dyspnea with exertion. EXAM: PORTABLE CHEST 1 VIEW COMPARISON:  05/01/2019 FINDINGS: The heart is enlarged. BILATERAL pulmonary opacities with effusions most consistent with pulmonary edema. Unchanged dual lead pacer. Worsening aeration. IMPRESSION: Worsening aeration. BILATERAL pulmonary opacities with effusions consistent with pulmonary edema. Electronically Signed   By: Staci Righter M.D.   On: 05/15/2019 18:15    Labs:  CBC: Recent Labs    06/05/19 0427 06/05/19 2128 06/06/19 0545 06/07/19 0412  WBC 12.0* 14.0* 12.0* 11.8*  HGB 8.9* 8.6* 9.4* 8.0*  HCT 28.5* 27.6* 29.6* 26.0*  PLT 352 361 350 332    COAGS: Recent Labs    02/23/19 0832  06/04/19 0427 06/04/19 1613 06/05/19 0427 06/06/19 0545 06/07/19 0412  INR 1.8*  --   --   --   --  1.3* 1.2  APTT  --    < > 63* 85* 77* 78*  --    < > = values in this interval not displayed.    BMP: Recent Labs    06/04/19 0427 06/05/19 2128 06/06/19 0545 06/07/19 0412  NA 134* 133* 135 139  K 3.4* 3.8 3.4* 4.1  CL 96* 95* 96* 102  CO2 27 26 28 25   GLUCOSE 99 142* 112* 96  BUN 32* 39* 36* 30*  CALCIUM 8.5* 8.4* 8.6* 8.7*  CREATININE 1.81* 2.14* 2.01* 1.63*  GFRNONAA 32* 26* 28* 36*  GFRAA 37* 30* 32* 41*    LIVER FUNCTION TESTS: Recent Labs    05/15/19 2046 05/16/19 0302 06/02/19 1705 06/03/19 0237 06/04/19 0427 06/07/19 0412  BILITOT 0.2* 0.3 1.1 0.3  --   --   AST 16 15 33 11*  --   --   ALT 19 19 18 14   --   --   ALKPHOS 64 66 63 62  --   --   PROT 6.3* 6.4* 6.3* 6.2*  --   --   ALBUMIN 2.2* 2.3* 2.3* 2.2* 2.0* 2.6*    TUMOR MARKERS: No results for input(s): AFPTM, CEA, CA199, CHROMGRNA in the last 8760 hours.  Assessment and Plan: Recurrent right pleural effusion Patient with recurrent right effusion.   Transitioning to hospice care.  Request for PleurX catheter. INR 1.1 Patient  alert and oriented.   Asking appropriate questions.   Risks and benefits discussed with the patient including bleeding, infection, damage to adjacent structures, and sepsis.  All of the patient's questions were answered, patient is agreeable to proceed. Consent signed and in chart.    Thank you for this interesting consult.  I greatly enjoyed meeting Andre [name] and look forward to participating in their care.  A copy of this report was sent to the requesting provider on this date.  Electronically Signed: Docia Barrier, PA 06/07/2019, 12:48 PM   I spent a total of 40 Minutes    in face to face in clinical consultation, greater than 50% of which was counseling/coordinating care for recurrent right pleural effusion.

## 2019-06-07 NOTE — Sedation Documentation (Signed)
Pt in IR room 1, supine on table with right side raised, strap in place.  Pt on cont cardiac monitoring, pt on 6L O2 via [place]

## 2019-06-07 NOTE — Consult Note (Signed)
Consultation Note Date: 06/07/2019   Patient Name: Andre [name]  DOB: 02-28-1923  MRN: [id number]  Age / Sex: 85 y.o., male  PCP: [name], MD Referring Physician: Allie Bossier, MD  Reason for Consultation: Establishing goals of care  HPI/Patient Profile: 85 y.o. male  with past medical history of sepsis, afib, HTN, HLD, diastolic CHF, CHB s/p pacemaker, acute respiratory failure on 5L oxygen, macular degeneration admitted on 06/05/2019 with shortness of breath. Patient discharged on 6/24 following hospitalization for acute on chronic respiratory failure secondary to CHF exacerbation and right-sided pleural effusion s/p thoracentesis with 1670 cc of bloody fluid removed. CT on 6/22 revealed moderate partially loculate right pleural effusion, emphysema with diffuse interstitial coarsening, bronchiectasis, possible basilar honeycombing. Findings suggest underlying interstitial lung disease. Also 4m left upper lobe pulmonary nodule. Palliative medicine consultation for goals of care.   Clinical Assessment and Goals of Care:  I have reviewed medical records, discussed with Dr. WSherral Hammers and met with patient at bedside to discuss diagnosis, prognosis, GOC, EOL wishes, disposition and options. Patient is awake, alert, oriented and able to participate in conversation. Visual impairment with history of macular degeneration  Introduced Palliative Medicine as specialized medical care for people living with serious illness. It focuses on providing relief from the symptoms and stress of a serious illness. The goal is to improve quality of life for both the patient and the family.  Explored [name]. Patient states "oh, that I'm dying." He tells me he may have 3-5 months to live. Further discussed course of hospitalization including diagnoses,  interventions, and poor prognosis with underlying co-morbidities.   Patient speaks of his frustrations with frequent re-hospitalizations. He wants to go home. He is agreeable for pleurx catheter placement to assist with symptom management.   We discussed hospice support in the home. He currently has 24/7 caregivers in the home that care for him and his wife with mild dementia. Patient is worried about his wife hearing the word "hospice." Explained that without support from home hospice, he will likely be in and out of the hospital. Patient requests I speak with his daughter, Andre Claudeabout the plan once discharged.  Patient does share that he has lived a full life and has no fears or concerns about nearing EOL except knowing his wife is taken care of. They have been married for 68years.  Patient denies symptoms other than dry eyes. RN instructed to give prn eye drops.    **Shortly after, spoke with daughter Andre Claudevia telephone. Introduced palliative medicine.   We discussed a brief life review of the patient. Andre Claudedescribes her father as "determined, active, social" and a very loving individual who has always looked at the glass "half full." Up until March 2020, patient living home and fairly independent, still walking daily. 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 [facility].  After her discussion with Dr. Sherral Hammers, Mechele [name] has a good understanding of diagnoses, interventions, and poor prognosis. She has seen her father "rapidly declining" and is saddened to hear of poor prognosis but "not a surprise." She understands his "body is shutting down" and he is not candidate for invasive or heroic interventions with age and comorbidities. Mechele [name] is interested in home hospice services and confirms her father has 24/7 caregivers in the home. She confirms DNR code  status.   Introduced hospice options and philosophy, emphasizing focus on comfort, quality, and dignity at EOL. Educated on symptom management medications to ensure relief from suffering and prevent recurrent hospitalization. Daughter understands he can transition to hospice facility if necessary when closer to EOL (2 weeks or less). Educated on EOL expectations.   Mechele [name] is most concerned about management of pleurx catheter and will need instruction on how to manage once pleurx is placed. This NP discussed with RN CM.   Questions and concerns were addressed. PMT contact information given. Emotional/spiritual support provided.     SUMMARY OF RECOMMENDATIONS    DNR/DNI. No heroic measures at EOL.  Continue current plan of care with shift to comfort focused care when discharged home with hospice services.   RN CM to arrange home hospice services.   Right pleurx cath placement today to aid with symptom management. Daughter will need to be educated by either hospital staff or home hospice staff on how to manage pleurx catheter.   Code Status/Advance Care Planning:  DNR  Symptom Management:   Roxanol 5-11m SL q4h prn pain/dyspnea  Trazodone 555mPO HS prn  Palliative Prophylaxis:   Aspiration, Delirium Protocol, Frequent Pain Assessment, Oral Care and Turn Reposition  Psycho-social/Spiritual:   Desire for further Chaplaincy support:yes  Additional Recommendations: Caregiving  Support/Resources, Compassionate Wean Education and Education on Hospice  Prognosis:   < 6 months  Discharge Planning: Home with Hospice      Primary Diagnoses: Present on Admission: . Recurrent pleural effusion on right . Hyperlipidemia . Acute-on-chronic kidney disease stage III . Atrial fibrillation/flutter . Pacemaker . Acute on chronic respiratory failure with hypoxia ([facility]. Chronic diastolic heart failure (HCLyle. Hypotension   I have reviewed the medical record, interviewed the  patient and family, and examined the patient. The following aspects are pertinent.  Past Medical History:  Diagnosis Date  . Acute on chronic respiratory failure with hypoxia (HCRiverle[facility]  . Adenomatous colon polyp 04/1985   no further colonoscopy, 2008-last colonoscopy, no polyps    . Atrial fibrillation ([facility]  . Chronic atrial fibrillation   . Chronic diastolic heart failure ([facility]  . Diverticulosis   . History of skin cancer    dermatology every 6 months Dr. DrJarome Matin. Hyperlipidemia   . Hypertension   . Lobar pneumonia, unspecified organism (HCB[facility]  . Macular degeneration    bilateral  . Nephrolithiasis   . PAF (paroxysmal atrial fibrillation) ([facility]West [facility]  . PAT (paroxysmal atrial tachycardia) (HCC)    many years ago, worse with smoking  . Severe sepsis ([facility]   Social History   Socioeconomic History  . Marital status: Married    Spouse name: Not on file  . Number of children: 5  . Years of education: Not on file  . Highest education level: Not on file  Occupational History  . Occupation: RETIRED    Employer: RETIRED  Social Needs  . Financial resource strain: Not on file  . Food insecurity  Worry: Not on file    Inability: Not on file  . Transportation needs    Medical: Not on file    Non-medical: Not on file  Tobacco Use  . Smoking status: Former Smoker    Packs/day: 1.00    Years: 60.00    Pack years: 60.00    Types: Cigarettes    Quit date: 12/13/2003    Years since quitting: 15.4  . Smokeless tobacco: Never Used  Substance and Sexual Activity  . Alcohol use: Yes    Alcohol/week: 0.0 standard drinks    Comment: 2 glass of wine a day  . Drug use: No  . Sexual activity: Not on file  Lifestyle  . Physical activity    Days per week: Not on file    Minutes per session: Not on file  . Stress: Not on file  Relationships  . Social Herbalist on phone: Not on file    Gets together: Not on file    Attends religious service: Not on file    Active  member of club or organization: Not on file    Attends meetings of clubs or organizations: Not on file    Relationship status: Not on file  Other Topics Concern  . Not on file  Social History Narrative   Married (64 years in 08/2015-goes to outside practice). 5 children. 8 grandchildren (lost 1 grandchild #9 to [place] 18,  Lost #9 to seizures)      Retired at [employer], high Cabin crew      Hobbies: time with family, go to beach (51 years in a row), travel   Family History  Problem Relation Age of Onset  . Hypertension Mother   . Cancer Father        lung   Scheduled Meds: . atorvastatin  10 mg Oral Daily  . methylPREDNISolone (SOLU-MEDROL) injection  80 mg Intravenous TID   Continuous Infusions: PRN Meds:.acetaminophen **OR** acetaminophen, albuterol, morphine CONCENTRATE, ondansetron **OR** ondansetron (ZOFRAN) IV, polyvinyl alcohol, traZODone Medications Prior to Admission:  Prior to Admission medications   Medication Sig Start Date End Date Taking? Authorizing Provider  albuterol (VENTOLIN HFA) 108 (90 Base) MCG/ACT inhaler Inhale 1 puff into the lungs every 4 (four) hours as needed for wheezing or shortness of breath. 05/21/19   Shelly Coss, MD  amiodarone (PACERONE) 200 MG tablet Take 1 tablet (200 mg total) by mouth daily. 05/24/19   Lelon Perla, MD  amLODipine (NORVASC) 5 MG tablet Take 1 tablet (5 mg total) by mouth daily. 05/27/19   [name], MD  apixaban (ELIQUIS) 2.5 MG TABS tablet Take 1 tablet (2.5 mg total) by mouth 2 (two) times daily. Patient taking differently: Take 2.5 mg by mouth at bedtime.  05/27/19   [name], MD  atorvastatin (LIPITOR) 10 MG tablet Take 1 tablet (10 mg total) by mouth daily. 05/27/19   [name], MD  furosemide (LASIX) 40 MG tablet Take 1 tablet (40 mg total) by mouth 2 (two) times daily. Patient taking differently: Take 20 mg by mouth 2 (two) times daily.  05/31/19 05/25/20  Deboraha Sprang, MD   losartan-hydrochlorothiazide (HYZAAR) 100-25 MG tablet Take 1 tablet by mouth daily. 05/27/19   [name], MD  Potassium Chloride ER 20 MEQ TBCR Take 20 mEq by mouth daily as needed (as long as taking lasix). Patient taking differently: Take 20 mEq by mouth daily.  05/27/19   [name], MD  No Known Allergies Review of Systems  Constitutional: Positive for activity change and appetite change.  Respiratory: Positive for shortness of breath.   Neurological: Positive for weakness.   Physical Exam Vitals signs and nursing note reviewed.  Constitutional:      General: He is awake.     Appearance: He is ill-appearing.  HENT:     Head: Normocephalic and atraumatic.  Cardiovascular:     Comments: paced Pulmonary:     Effort: No tachypnea, accessory muscle usage or respiratory distress.  Skin:    General: Skin is warm and dry.     Coloration: Skin is pale.  Neurological:     Mental Status: He is alert and oriented to person, place, and time.  Psychiatric:        Mood and Affect: Mood normal.        Speech: Speech normal.        Cognition and Memory: Cognition normal.    Vital Signs: BP (!) 117/51 (BP Location: Right Arm)   Pulse 85   Temp (!) 97.4 F (36.3 C) (Axillary)   Resp (!) 23   Ht _0  (1.651 m)   Wt 55.4 kg   SpO2 90%   BMI 20.32 kg/m  Pain Scale: 0-10   Pain Score: 0-No pain   SpO2: SpO2: 90 % O2 Device:SpO2: 90 % O2 Flow Rate: .O2 Flow Rate (L/min): 5 L/min  IO: Intake/output summary:   Intake/Output Summary (Last 24 hours) at 06/07/2019 1223 Last data filed at 06/07/2019 1039 Gross per 24 hour  Intake 399 ml  Output 1250 ml  Net -851 ml    LBM: Last BM Date: 06/06/19 Baseline Weight: Weight: 57.1 kg Most recent weight: Weight: 55.4 kg     Palliative Assessment/Data: PPS 40%   Flowsheet Rows     Most Recent Value  Intake Tab  Referral Department  Hospitalist  Unit at Time of Referral  Med/Surg Unit  Palliative Care Primary  Diagnosis  Pulmonary  Palliative Care Type  New Palliative care  Reason for referral  Clarify Goals of Care  Date first seen by Palliative Care  06/07/19  Clinical Assessment  Palliative Performance Scale Score  40%  Psychosocial & Spiritual Assessment  Palliative Care Outcomes  Patient/Family meeting held?  Yes  Who was at the meeting?  patient and spoke with daughter via telephone  Palliative Care Outcomes  Clarified goals of care, Counseled regarding hospice, Provided end of life care assistance, Provided psychosocial or spiritual support, Improved pain interventions, Improved non-pain symptom therapy, ACP counseling assistance, Transitioned to hospice      Time In/Out: 0930-1000, 1100-1150 Time Total: 31mn Greater than 50%  of this time was spent counseling and coordinating care related to the above assessment and plan.  Signed by:  MIhor Dow FNP-C Palliative Medicine Team  Phone: [phone]: [phone]  Please contact Palliative Medicine Team phone at [phone] questions and concerns.  For individual provider: See AShea Evans

## 2019-06-07 NOTE — Procedures (Signed)
Interventional Radiology Procedure Note  Procedure: Right tunneled PleurX catheter placement  Complications: None  Estimated Blood Loss: < 10 mL  Findings: PleurX catheter placed in right pleural space. 1.4 L of pleural fluid removed.  Venetia Night. Kathlene Cote, M.D Pager:  [phone]

## 2019-06-07 NOTE — Telephone Encounter (Signed)
Please see message and advise 

## 2019-06-07 NOTE — TOC Progression Note (Addendum)
Transition of Care [facility]) - Progression Note    Patient Details  Name: Andre [name] MRN: [id number] Date of Birth: April 01, 1923  Transition of [facility]) CM/SW Contact  Maryclare Labrador, RN Phone Number: 06/07/2019, 1:20 PM  Clinical Narrative:    CM received consult regarding home with hospice - per [facility] palliative department pt/daughter chose [facility].  Referral called into [facility] - tentative  referral accepted with Plurex catheter and home oxygen.  CM  was not able to reach pts daughter.  Per palliative; pt already has 24 hour support in the home.  Pt will need ambulance transport home.  CM requested bedside nurse to order Plurex box so pt could take home ample supply at discharge  Update:  [facility] still has case in review - if pt is accepted then  pt and will also provide teaching to daughter on Plurex, CM also requested bedside nurse to also provide teaching on drainage catheter.  Daughter confirms that pt will have 24 hour care in the home - no equipment requested at discharge.  Weekend CM will complete Plurex forms and provide to [place].     Expected Discharge Plan: [place]    Expected Discharge Plan and Services Expected Discharge Plan: [place] In-house Referral: Hospice / [place] Acute Care Choice: Hospice Living arrangements for the past 2 months: [place]: OT [facility] Agency: Hospice and [place] Date [place]: 06/07/19 Time [place]: [place] Representative spoke with at Drain: [place] ([place]) Interventions    Readmission Risk Interventions Readmission Risk Prevention Plan 06/05/2019 05/20/2019 03/08/2019  Transportation Screening Complete Complete Complete  PCP or Specialist Appt within 5-7 Days - - Complete  PCP or Specialist Appt within 3-5 Days - Not Complete -  Not Complete comments - not yet  ready for d/c -  Home Care Screening - - Complete  Medication Review (RN CM) - - Complete  HRI or [place] - Complete -  Social Work Consult for [place] Planning/Counseling - Complete -  Palliative Care Screening - Not Applicable -  Medication Review Press photographer) Complete Complete -  PCP or Specialist appointment within 3-5 days of discharge Complete - -  [place] or Home Care Consult Complete - -  SW Recovery Care/Counseling Consult Complete - -  Palliative Care Screening Not Applicable - -  [place] Not Applicable - -  Some recent data might be hidden

## 2019-06-08 ENCOUNTER — Encounter: Payer: Self-pay | Admitting: Cardiology

## 2019-06-08 DIAGNOSIS — N170 Acute kidney failure with tubular necrosis: Secondary | ICD-10-CM

## 2019-06-08 DIAGNOSIS — R531 Weakness: Secondary | ICD-10-CM

## 2019-06-08 DIAGNOSIS — N172 Acute kidney failure with medullary necrosis: Secondary | ICD-10-CM

## 2019-06-08 LAB — BASIC METABOLIC PANEL
Anion gap: 15 (ref 5–15)
BUN: 32 mg/dL — ABNORMAL HIGH (ref 8–23)
CO2: 25 mmol/L (ref 22–32)
Calcium: 8.9 mg/dL (ref 8.9–10.3)
Chloride: 96 mmol/L — ABNORMAL LOW (ref 98–111)
Creatinine, Ser: 1.68 mg/dL — ABNORMAL HIGH (ref 0.61–1.24)
GFR calc Af Amer: 40 mL/min — ABNORMAL LOW (ref >60)
GFR calc non Af Amer: 34 mL/min — ABNORMAL LOW (ref >60)
Glucose, Bld: 279 mg/dL — ABNORMAL HIGH (ref 70–99)
Potassium: 4.2 mmol/L (ref 3.5–5.1)
Sodium: 136 mmol/L (ref 135–145)

## 2019-06-08 LAB — CBC
HCT: 28.1 % — ABNORMAL LOW (ref 39.0–52.0)
Hemoglobin: 8.6 g/dL — ABNORMAL LOW (ref 13.0–17.0)
MCH: 25.6 pg — ABNORMAL LOW (ref 26.0–34.0)
MCHC: 30.6 g/dL (ref 30.0–36.0)
MCV: 83.6 fL (ref 80.0–100.0)
Platelets: 346 10*3/uL (ref 150–400)
RBC: 3.36 MIL/uL — ABNORMAL LOW (ref 4.22–5.81)
RDW: 15.9 % — ABNORMAL HIGH (ref 11.5–15.5)
WBC: 10.9 10*3/uL — ABNORMAL HIGH (ref 4.0–10.5)
nRBC: 0 % (ref 0.0–0.2)

## 2019-06-08 LAB — MAGNESIUM: Magnesium: 2.2 mg/dL (ref 1.7–2.4)

## 2019-06-08 LAB — FERRITIN: Ferritin: 172 ng/mL (ref 24–336)

## 2019-06-08 LAB — PROTIME-INR
INR: 1.2 (ref 0.8–1.2)
Prothrombin Time: 15.2 seconds (ref 11.4–15.2)

## 2019-06-08 LAB — IRON AND TIBC
Iron: 16 ug/dL — ABNORMAL LOW (ref 45–182)
Saturation Ratios: 9 % — ABNORMAL LOW (ref 17.9–39.5)
TIBC: 188 ug/dL — ABNORMAL LOW (ref 250–450)
UIBC: 172 ug/dL

## 2019-06-08 LAB — FOLATE: Folate: 17.5 ng/mL (ref >5.9)

## 2019-06-08 LAB — ALBUMIN: Albumin: 2.5 g/dL — ABNORMAL LOW (ref 3.5–5.0)

## 2019-06-08 LAB — RETICULOCYTES
Immature Retic Fract: 20.3 % — ABNORMAL HIGH (ref 2.3–15.9)
RBC.: 3.36 MIL/uL — ABNORMAL LOW (ref 4.22–5.81)
Retic Count, Absolute: 32.9 10*3/uL (ref 19.0–186.0)
Retic Ct Pct: 1 % (ref 0.4–3.1)

## 2019-06-08 LAB — VITAMIN B12: Vitamin B-12: 922 pg/mL — ABNORMAL HIGH (ref 180–914)

## 2019-06-08 MED ORDER — TRAZODONE HCL 50 MG PO TABS: 50.0000 mg | ORAL_TABLET | Freq: Every evening | ORAL | 0 refills | Status: AC | PRN

## 2019-06-08 MED ORDER — PREDNISONE 50 MG PO TABS: ORAL_TABLET | ORAL | 0 refills | Status: AC

## 2019-06-08 MED ORDER — MORPHINE SULFATE (CONCENTRATE) 10 MG/0.5ML PO SOLN: 5.0000 mg | ORAL | 0 refills | Status: AC | PRN

## 2019-06-08 MED ORDER — ONDANSETRON HCL 4 MG PO TABS: 4.0000 mg | ORAL_TABLET | Freq: Four times a day (QID) | ORAL | 0 refills | Status: AC | PRN

## 2019-06-08 MED ORDER — POLYVINYL ALCOHOL 1.4 % OP SOLN: 1.0000 [drp] | OPHTHALMIC | 0 refills | Status: AC | PRN

## 2019-06-08 NOTE — Plan of Care (Signed)
Discussed plan of care for tonight and patient stated he knew his daughter was going to learn how to empty his pleurevac with his pigtail cath.

## 2019-06-08 NOTE — Progress Notes (Signed)
Manufacturing engineer [facility]) Hospice  Spoke with daughter this morning, she thinks her father is still being discharged today.  She knows she needs to be showed how to drain his catheter--she voices some concerns with this.  Advised that once he is home, [facility] will send a RN out to reinforce learning on his drain.  Once discharge summary complete, please fax to [phone].  Thank you, Venia Carbon RN, BSN, [place] [facility] Liaison (in [place] under [facility]) [phone]

## 2019-06-08 NOTE — Progress Notes (Signed)
Referring Physician(s): Dr. Sherral Hammers  Supervising Physician: Corrie Mckusick  Patient Status:  [facility] - In-pt  Chief Complaint: Follow up right tunneled pleural catheter placement in IR on 6/26.  Subjective:  Patient laying in bed sleeping upon arrival to room, easily arouses to voice cues. He denies any pain, dyspnea or other complaints besides being tired. He is hopeful that he can go home today.  Allergies: Patient has no known allergies.  Medications: Prior to Admission medications   Medication Sig Start Date End Date Taking? Authorizing Provider  albuterol (VENTOLIN HFA) 108 (90 Base) MCG/ACT inhaler Inhale 1 puff into the lungs every 4 (four) hours as needed for wheezing or shortness of breath. 05/21/19   Shelly Coss, MD  amiodarone (PACERONE) 200 MG tablet Take 1 tablet (200 mg total) by mouth daily. 05/24/19   Lelon Perla, MD  amLODipine (NORVASC) 5 MG tablet Take 1 tablet (5 mg total) by mouth daily. 05/27/19   [name], MD  apixaban (ELIQUIS) 2.5 MG TABS tablet Take 1 tablet (2.5 mg total) by mouth 2 (two) times daily. Patient taking differently: Take 2.5 mg by mouth at bedtime.  05/27/19   [name], MD  atorvastatin (LIPITOR) 10 MG tablet Take 1 tablet (10 mg total) by mouth daily. 05/27/19   [name], MD  furosemide (LASIX) 40 MG tablet Take 1 tablet (40 mg total) by mouth 2 (two) times daily. Patient taking differently: Take 20 mg by mouth 2 (two) times daily.  05/31/19 05/25/20  Deboraha Sprang, MD  losartan-hydrochlorothiazide (HYZAAR) 100-25 MG tablet Take 1 tablet by mouth daily. 05/27/19   [name], MD  Potassium Chloride ER 20 MEQ TBCR Take 20 mEq by mouth daily as needed (as long as taking lasix). Patient taking differently: Take 20 mEq by mouth daily.  05/27/19   [name], MD     Vital Signs: BP (!) 117/53 (BP Location: Right Arm)   Pulse 68   Temp 97.6 F (36.4 C) (Axillary)   Resp 16   Ht 5\' 5"  (1.651 m)   Wt 115  lb 8.3 oz (52.4 kg)   SpO2 100%   BMI 19.22 kg/m   Physical Exam Vitals signs and nursing note reviewed.  Constitutional:      General: He is not in acute distress.    Appearance: He is ill-appearing.  HENT:     Head: Normocephalic.  Cardiovascular:     Rate and Rhythm: Normal rate.  Pulmonary:     Effort: Pulmonary effort is normal.     Breath sounds: Normal breath sounds.     Comments: (+) right sided pleurx in place. Insertion site clean, dry, dressed appropriately without active bleeding or discharge. No pain on palpation. Skin:    General: Skin is warm and dry.  Neurological:     Mental Status: He is alert. Mental status is at baseline.     Imaging: Dg Chest 2 View  Result Date: 06/05/2019 CLINICAL DATA:  85 year old male with shortness of breath. EXAM: CHEST - 2 VIEW COMPARISON:  Chest CT dated 06/03/2019 FINDINGS: There is emphysema and chronic interstitial coarsening. Moderate right pleural effusion and associated atelectatic changes. No pneumothorax. The cardiac silhouette is within normal limits. Left pectoral pacemaker device. Osteopenia with degenerative changes of the spine. No acute osseous pathology. IMPRESSION: 1. Moderate right pleural effusion and associated atelectatic changes similar to the prior CT. 2. Emphysema. Electronically Signed   By: Laren Everts.D.  On: 06/05/2019 21:22    Labs:  CBC: Recent Labs    06/06/19 0545 06/07/19 0412 06/07/19 1832 06/08/19 0727  WBC 12.0* 11.8* 11.7* 10.9*  HGB 9.4* 8.0* 9.3* 8.6*  HCT 29.6* 26.0* 30.1* 28.1*  PLT 350 332 338 346    COAGS: Recent Labs    02/23/19 0832  06/04/19 0427 06/04/19 1613 06/05/19 0427 06/06/19 0545 06/07/19 0412 06/08/19 0727  INR 1.8*  --   --   --   --  1.3* 1.2 1.2  APTT  --    < > 63* 85* 77* 78*  --   --    < > = values in this interval not displayed.    BMP: Recent Labs    06/05/19 2128 06/06/19 0545 06/07/19 0412 06/08/19 0727  NA 133* 135 139 136  K 3.8  3.4* 4.1 4.2  CL 95* 96* 102 96*  CO2 26 28 25 25   GLUCOSE 142* 112* 96 279*  BUN 39* 36* 30* 32*  CALCIUM 8.4* 8.6* 8.7* 8.9  CREATININE 2.14* 2.01* 1.63* 1.68*  GFRNONAA 26* 28* 36* 34*  GFRAA 30* 32* 41* 40*    LIVER FUNCTION TESTS: Recent Labs    05/15/19 2046 05/16/19 0302 06/02/19 1705 06/03/19 0237 06/04/19 0427 06/07/19 0412 06/07/19 1832 06/08/19 0727  BILITOT 0.2* 0.3 1.1 0.3  --   --   --   --   AST 16 15 33 11*  --   --   --   --   ALT 19 19 18 14   --   --   --   --   ALKPHOS 64 66 63 62  --   --   --   --   PROT 6.3* 6.4* 6.3* 6.2*  --   --  6.5  --   ALBUMIN 2.2* 2.3* 2.3* 2.2* 2.0* 2.6* 2.6* 2.5*    Assessment and Plan:  85 y/o M with recurrent right pleural effusion s/p right tunneled pleural catheter placement 6/26 by Dr. Kathlene Cote. Patient denies complaints, insertion site unremarkable on exam today. Patient to be d/ced with hospice who will maintain Pleurx per ordering physician.   Pleurx is ready for use if needed - however 1.4 L of pleural fluid was removed yesterday during placement so unlikely will need to drain while inpatient if not planned for prolonged stay.   Further plans per primary team. Please call IR with questions or concerns.   Electronically Signed: Joaquim Nam, PA-C 06/08/2019, 9:56 AM   I spent a total of 15 Minutes at the the patient's bedside AND on the patient's hospital floor or unit, greater than 50% of which was counseling/coordinating care for right pleurx follow up.

## 2019-06-08 NOTE — Discharge Summary (Signed)
Physician Discharge Summary  RODARIUS KICHLINE SJG:[id number] DOB: 10/25/1923 DOA: 06/05/2019  PCP: [name], MD  Admit date: 06/05/2019 Discharge date: 06/08/2019  Time spent: 35 minutes  Recommendations for Outpatient Follow-up:   Acute on chronic respiratory failure with hypoxia/emphysema - Patient discharged on 6/24 with diagnosis of interstitial lung disease/emphysema by CT scan complicated by right sided loculated pleural effusion.  At that time 167ml bloody fluid aspirated. -Per patient and daughter not informed of seriousness of his lung disease. - Patient returns with recurrent right-sided pleural effusion and S OB. - Understands secondary to his multiple organ failures i.e. CHF, respiratory, kidney that he will have recurrent effusions in the future.  Has elected to go home on hospice. - 6/26 scheduled for RIGHT Pleurx cath placement -6/26 RIGHT recurrent pleural effusion labs pending -Titrate O2 to maintain SPO2> 93% -  Discharge on high-dose prednisone x5 days.  Which will complete a week of high-dose steroids.  If patient has improvement PCP can titrate to effect.     Interstitial lung disease - See respiratory failure -Patient has been counseled to follow-up with his PCP upon discharge  Recurrent right-sided pleural effusion/Loculated pleural effusion - See respiratory failure -6/26 RIGHT Pleurx cath placed: 1.4 L pleural fluid removed  Complete heart block/Chronic Diastolic CHF - S/p permanent pacemaker - EF 60 to 65%, March 2020 - Strict in and out -1.5 L -Daily weight Filed Weights   06/06/19 0115 06/07/19 0613 06/08/19 0608  Weight: 57.1 kg 55.4 kg 52.4 kg  -We will hold all diuretics upon discharge.  PCP, or cardiologist to restart as needed.  Chronic atrial fibrillation -CHA2DS2-VASc score =atleast 4. - Currently paced NSR - Eliquis on hold for procedure  Hypotension - Multifactorial space overdiuresis, hypoalbuminemia, CHF, underlying lung  disease..  Correct underlying factors - Albumin 50 g x 1 administered in unit - Normal saline 500 ml administered in ED - Patient's BP stabilized.  Patient and daughter understand this is only temporary given his tenuous overall condition. -  Hold all hypertensive medications PCP/cardiologist to restart as required  Acute on CKD stage III (baseline Cr 1.7) -Normally would consider additional fluids however given patient's poor albumin level any significant addition of fluids would end up in his chest cavity. - Monitor closely Recent Labs  Lab 06/04/19 0427 06/05/19 2128 06/06/19 0545 06/07/19 0412 06/08/19 0727  CREATININE 1.81* 2.14* 2.01* 1.63* 1.68*  -At baseline  Leukocytosis acute on chronic - Reactive vs low-grade infection vs malignancy -PCP continue to monitor; resolving  Anemia (baseline HgB 12 in March 2020)  -Anemia panel pending -Fecal occult pending -Bloody fluid aspirated on thoracentesis 6/22. -No overt signs of bleeding during this admission. Recent Labs  Lab 06/05/19 2128 06/06/19 0545 06/07/19 0412 06/07/19 1832 06/08/19 0727  HGB 8.6* 9.4* 8.0* 9.3* 8.6*  -Stable will leave to PCP on how far approach work-up considering patient now on hospice.  Hyponatremia -Secondary to CHF and overdiuresis - Monitor closely Recent Labs  Lab 06/04/19 0427 06/05/19 2128 06/06/19 0545 06/07/19 0412 06/08/19 0727  NA 134* 133* 135 139 136  -Resolved  Hypokalemia - Potassium goal> 4  Hypoalbunemia - 6/25 Albumin 50 g -  PCP/cardiologist to check every other week.  May require supplements.  Hyperlipidemia -Continue Lipitor   Generalized muscle weakness -Multifactorial CHF, lung disease, anemia.  Corrected underlying issues as much as feasible. -See goals of care.    Goals of care - 6/24 palliative care:Spoke at length with patient and daughter they understand  that he is in the terminal phase of his life and would like to pursue hospice.  Have  requested placement of Pleurx cath in order to allow patient to stay out of the hospital and spend as much time as possible with family.  Evaluate patient for home services.  Please ensure daughter is contacted and kept in the loop.    Discharge Diagnoses:  Principal Problem:   Acute on chronic respiratory failure with hypoxia (HCC) Active Problems:   Hyperlipidemia   Acute-on-chronic kidney disease stage III   Acute on chronic diastolic heart failure (HCC)   Atrial fibrillation/flutter   CHB (complete heart block) (HCC)   Pacemaker   Chronic diastolic heart failure (HCC)   Recurrent pleural effusion on right   Hypotension   Palliative care by specialist   Goals of care, counseling/discussion   Emphysema lung ([place])   Interstitial lung disease ([place])   Recurrent right pleural effusion   Loculated pleural effusion   Complete heart block (HCC)   Acute renal failure superimposed on stage 3 chronic kidney disease (HCC)   Generalized muscle weakness   HLD (hyperlipidemia)   Discharge Condition: Guarded  Diet recommendation: Regular  Filed Weights   06/06/19 0115 06/07/19 0613 06/08/19 0608  Weight: 57.1 kg 55.4 kg 52.4 kg    History of present illness:  85 y.o.malePMHx HTN, diastolic CHF, last EF 61-60%VP7/1062, atrial fibrillation on Eliquis, complete heart block status post pacemaker, and chronic respiratory failure on5L nasal cannula oxygen;   Presents with complaints ofshortness of breath. Patient admitted on 6/21 and had just been discharged from the hospital 6/24 for acute on chronic respiratory failure secondary to congestive heart failure exacerbation with right-sided pleural effusion. During his hospital stay he was evaluated by PCCM who performed thoracentesis taking off 1670 ml of bloody fluid on 6/22. The fluid was exudative with reactive mesothelial cells. After getting home, he reported getting up to walk around and feeling very short of breath with any  exertion. He was going to use the restroom while using his walker when he reports that his legs gave out. He states that he would have fallen at the nursing aide had not been present. Associated times included intermittent chronic dry cough, lower extremities swelling, easybruisability. Denies having any lightheadedness, nausea, vomiting, abdominal pain, loss of consciousness, or trauma to his head.  Hospital Course:  During his admission patient was treated for acute on chronic respiratory failure with hypoxia secondary to recurrent RIGHT sided recurrent pleural effusion/loculated pleural effusion.  A Pleurx cath was placed on 6/26 and 1.4 L of fluid was removed.  Significantly improving patient's respiratory status.  Patient now back to his baseline chronic respiratory failure of 5 L O2 via [place].  In addition patient receiving short course of high-dose steroids for his interstitial lung disease which is playing into poor respiratory status.  Patient's chronic diastolic CHF now baseline.  Patient will go home with home hospice.  Prior to discharge will arrange for daughter to visit patient in the hospital she will be given instructions on how to drain patient's Pleurx cath PRN.  Stable for discharge.    Procedures: 6/22 CT chest  Wo contrast:1. Moderate partially loculated right pleural effusion which measures simple fluid density, no CT findings to suggest hemorrhage. Associated compressive atelectasis. 2. Emphysema with diffuse interstitial coarsening, bronchiectasis, possible basilar honeycombing. Findings suggest underlying interstitial lung disease. 3. Subpleural 5 mm left upper lobe pulmonary nodule, more conspicuous than on prior exam. Recommend follow-up CT in  1 year, giving consideration for patient's advanced age. 4. Right anterior epicardial rounded soft tissue density is stable from exam 2 months ago, indeterminate. 6/22 RIGHT thoracentesis: 1665ml  bloody exudate aspirated.  Note  did not fully resolve effusion secondary to partially loculated RIGHT effusion. --------------------------------------------------------------------------------------------------------------- 6/26 RIGHT Pleurx cath placed: 1.4 L pleural fluid removed  Consultations: IR  Cultures   6/26 RIGHT pleural effusion cultures pending     Lines/tubes Permanent pacemaker left chest wall>> RIGHT Pleurx cath 6/22>>  Discharge Exam: Vitals:   06/08/19 0400 06/08/19 0608 06/08/19 0801 06/08/19 1039  BP: (!) 128/53  (!) 117/53 (!) 118/49  Pulse: 66  68 69  Resp: 16   19  Temp: (!) 97.4 F (36.3 C)  97.6 F (36.4 C) 97.6 F (36.4 C)  TempSrc: Oral  Axillary Oral  SpO2: 100%  100% 97%  Weight:  52.4 kg    Height:        General: A/O x4 positive acute on chronic respiratory distress Eyes: negative scleral hemorrhage, negative anisocoria, negative icterus ENT: Negative Runny nose, negative gingival bleeding, Neck:  Negative scars, masses, torticollis, lymphadenopathy, JVD Lungs: LEFT lung fields clear to auscultation, RIGHT upper lung fields clear to auscultation, RML/RLL absent breath sounds, without wheezes or crackles Cardiovascular: Paced regular rate and rhythm without murmur gallop or rub normal S1 and S2  Discharge Instructions   Allergies as of 06/08/2019   No Known Allergies     Medication List    STOP taking these medications   amiodarone 200 MG tablet Commonly known as: Pacerone   amLODipine 5 MG tablet Commonly known as: NORVASC   furosemide 40 MG tablet Commonly known as: Lasix   losartan-hydrochlorothiazide 100-25 MG tablet Commonly known as: HYZAAR   Potassium Chloride ER 20 MEQ Tbcr     TAKE these medications   albuterol 108 (90 Base) MCG/ACT inhaler Commonly known as: VENTOLIN HFA Inhale 1 puff into the lungs every 4 (four) hours as needed for wheezing or shortness of breath.   apixaban 2.5 MG Tabs tablet Commonly known as: Eliquis Take 1 tablet  (2.5 mg total) by mouth 2 (two) times daily. What changed: when to take this   atorvastatin 10 MG tablet Commonly known as: LIPITOR Take 1 tablet (10 mg total) by mouth daily.   morphine CONCENTRATE 10 MG/0.5ML Soln concentrated solution Place 0.25-0.5 mLs (5-10 mg total) under the tongue every 4 (four) hours as needed for moderate pain or shortness of breath.   ondansetron 4 MG tablet Commonly known as: ZOFRAN Take 1 tablet (4 mg total) by mouth every 6 (six) hours as needed for nausea.   polyvinyl alcohol 1.4 % ophthalmic solution Commonly known as: LIQUIFILM TEARS Place 1 drop into both eyes as needed for dry eyes.   predniSONE 50 MG tablet Commonly known as: DELTASONE 1 tab p.o. 3 times daily   traZODone 50 MG tablet Commonly known as: DESYREL Take 1 tablet (50 mg total) by mouth at bedtime as needed for sleep.      No Known Allergies    The results of significant diagnostics from this hospitalization (including imaging, microbiology, ancillary and laboratory) are listed below for reference.    Significant Diagnostic Studies: Dg Chest 2 View  Result Date: 06/05/2019 CLINICAL DATA:  85 year old male with shortness of breath. EXAM: CHEST - 2 VIEW COMPARISON:  Chest CT dated 06/03/2019 FINDINGS: There is emphysema and chronic interstitial coarsening. Moderate right pleural effusion and associated atelectatic changes. No pneumothorax. The cardiac silhouette  is within normal limits. Left pectoral pacemaker device. Osteopenia with degenerative changes of the spine. No acute osseous pathology. IMPRESSION: 1. Moderate right pleural effusion and associated atelectatic changes similar to the prior CT. 2. Emphysema. Electronically Signed   By: Anner Crete M.D.   On: 06/05/2019 21:22   Ct Chest Wo Contrast  Result Date: 06/04/2019 CLINICAL DATA:  Linear pleural effusion after thoracentesis. EXAM: CT CHEST WITHOUT CONTRAST TECHNIQUE: Multidetector CT imaging of the chest was  performed following the standard protocol without IV contrast. COMPARISON:  Chest radiograph earlier this day.  Chest CT 04/01/2019 FINDINGS: Cardiovascular: Dense aortic atherosclerosis. No aneurysm. Left-sided pacemaker in place is leads in the right atrium and ventricle. Mild cardiomegaly. Coronary artery calcifications. Mediastinum/Nodes: No enlarged mediastinal lymph nodes. Limited assessment for hilar adenopathy given lack of IV contrast. Right hilar calcifications likely calcified nodes. Unchanged 3.4 cm right thyroid nodule. Esophagus decompressed. Unchanged right anterior epicardial well-defined round mass lesion measuring 3.9 x 2.3 cm. Lungs/Pleura: Emphysema. Moderate right pleural effusion is partially loculated anterior laterally and medial about the mediastinum. Fluid measures simple fluid density without CT findings to suggest hemorrhage. There is adjacent compressive atelectasis. Mild right middle lobe bronchiectasis minimal left pleural thickening without effusion. Diffuse coarse interstitial markings throughout the left lung. Subpleural nodule in the anterior left upper lobe measuring 5 mm, image 57 series 5, more conspicuous than on prior exam. Mild left lower lobe bronchiectasis with mild subpleural reticulation, possible honeycombing. Trachea and mainstem bronchi are patent. No pneumothorax. Scattered calcified granuloma. Upper Abdomen: Right anterior epicardial lesion as described. No acute upper abdominal finding. Musculoskeletal: Chronic lower thoracic compression fracture. There are no acute or suspicious osseous abnormalities. IMPRESSION: 1. Moderate partially loculated right pleural effusion which measures simple fluid density, no CT findings to suggest hemorrhage. Associated compressive atelectasis. 2. Emphysema with diffuse interstitial coarsening, bronchiectasis, possible basilar honeycombing. Findings suggest underlying interstitial lung disease. 3. Subpleural 5 mm left upper lobe  pulmonary nodule, more conspicuous than on prior exam. Recommend follow-up CT in 1 year, giving consideration for patient's advanced age. 4. Right anterior epicardial rounded soft tissue density is stable from exam 2 months ago, indeterminate. Aortic Atherosclerosis (ICD10-I70.0) and Emphysema (ICD10-J43.9). Electronically Signed   By: Keith Rake M.D.   On: 06/04/2019 01:49   Dg Chest Port 1 View  Result Date: 06/03/2019 CLINICAL DATA:  Pleural effusion on the right EXAM: PORTABLE CHEST 1 VIEW COMPARISON:  Yesterday FINDINGS: Diminished right pleural effusion with residual potentially loculated laterally. Diffuse interstitial and airspace opacity. No pneumothorax. Normal heart size. Dual-chamber pacer leads from the right. IMPRESSION: No complicating feature after right thoracentesis. Pleural fluid has decreased to small volume. Electronically Signed   By: Monte Fantasia M.D.   On: 06/03/2019 07:00   Dg Chest Portable 1 View  Result Date: 06/02/2019 CLINICAL DATA:  Acute shortness of breath EXAM: PORTABLE CHEST 1 VIEW COMPARISON:  05/15/2019 FINDINGS: An enlarging RIGHT pleural effusion is noted, now moderate to large. Cardiomegaly and interstitial pulmonary edema again noted. Compressive RIGHT LOWER lung atelectasis noted. LEFT pacemaker again noted. No pneumothorax or acute bony abnormality. IMPRESSION: Enlarging RIGHT pleural effusion, now moderate to large. Interstitial pulmonary edema again noted. Electronically Signed   By: Margarette Canada M.D.   On: 06/02/2019 17:20   Dg Chest Port 1 View  Result Date: 05/15/2019 CLINICAL DATA:  Dyspnea with exertion. EXAM: PORTABLE CHEST 1 VIEW COMPARISON:  05/01/2019 FINDINGS: The heart is enlarged. BILATERAL pulmonary opacities with effusions most consistent with pulmonary  edema. Unchanged dual lead pacer. Worsening aeration. IMPRESSION: Worsening aeration. BILATERAL pulmonary opacities with effusions consistent with pulmonary edema. Electronically Signed    By: Staci Righter M.D.   On: 05/15/2019 18:15    Microbiology: Recent Results (from the past 240 hour(s))  SARS Coronavirus 2 (CEPHEID- Performed in [facility] hospital lab), Hosp Order     Status: None   Collection Time: 06/02/19  5:05 PM   Specimen: Nasopharyngeal Swab  Result Value Ref Range Status   SARS Coronavirus 2 NEGATIVE NEGATIVE Final    Comment: (NOTE) If result is NEGATIVE SARS-CoV-2 target nucleic acids are NOT DETECTED. The SARS-CoV-2 RNA is generally detectable in upper and lower  respiratory specimens during the acute phase of infection. The lowest  concentration of SARS-CoV-2 viral copies this assay can detect is 250  copies / mL. A negative result does not preclude SARS-CoV-2 infection  and should not be used as the sole basis for treatment or other  patient management decisions.  A negative result may occur with  improper specimen collection / handling, submission of specimen other  than nasopharyngeal swab, presence of viral mutation(s) within the  areas targeted by this assay, and inadequate number of viral copies  (<250 copies / mL). A negative result must be combined with clinical  observations, patient history, and epidemiological information. If result is POSITIVE SARS-CoV-2 target nucleic acids are DETECTED. The SARS-CoV-2 RNA is generally detectable in upper and lower  respiratory specimens dur ing the acute phase of infection.  Positive  results are indicative of active infection with SARS-CoV-2.  Clinical  correlation with patient history and other diagnostic information is  necessary to determine patient infection status.  Positive results do  not rule out bacterial infection or co-infection with other viruses. If result is PRESUMPTIVE POSTIVE SARS-CoV-2 nucleic acids MAY BE PRESENT.   A presumptive positive result was obtained on the submitted specimen  and confirmed on repeat testing.  While 2019 novel coronavirus  (SARS-CoV-2) nucleic acids may  be present in the submitted sample  additional confirmatory testing may be necessary for epidemiological  and / or clinical management purposes  to differentiate between  SARS-CoV-2 and other Sarbecovirus currently known to infect humans.  If clinically indicated additional testing with an alternate test  methodology [phone]) is advised. The SARS-CoV-2 RNA is generally  detectable in upper and lower respiratory sp ecimens during the acute  phase of infection. The expected result is Negative. Fact Sheet for Patients:  StrictlyIdeas.no Fact Sheet for Healthcare Providers: BankingDealers.co.za This test is not yet approved or cleared by the [place] FDA and has been authorized for detection and/or diagnosis of SARS-CoV-2 by FDA under an Emergency Use Authorization (EUA).  This EUA will remain in effect (meaning this test can be used) for the duration of the COVID-19 declaration under Section 564(b)(1) of the Act, 21 U.S.C. section 360bbb-3(b)(1), unless the authorization is terminated or revoked sooner. Performed at [facility], [place] [address], [place], [place] [address]   Body fluid culture (includes gram stain)     Status: None   Collection Time: 06/03/19  6:03 AM   Specimen: Pleural Fluid  Result Value Ref Range Status   Specimen Description PLEURAL RIGHT  Final   Special Requests NONE  Final   Gram Stain   Final    MODERATE WBC PRESENT,BOTH PMN AND MONONUCLEAR NO ORGANISMS SEEN    Culture   Final    NO GROWTH 3 DAYS Performed at [facility], [place] [address] [place], [place] [address]    Report Status 06/06/2019 FINAL  Final  MRSA PCR Screening     Status: None   Collection Time: 06/03/19  4:34 PM   Specimen: Nasopharyngeal  Result Value Ref Range Status   MRSA by PCR NEGATIVE NEGATIVE Final    Comment:        The GeneXpert MRSA Assay (FDA approved for NASAL specimens only), is one component of  a comprehensive MRSA colonization surveillance program. It is not intended to diagnose MRSA infection nor to guide or monitor treatment for MRSA infections. Performed at [facility], [place] [address], [place], [place] [address]   Blood culture (routine x 2)     Status: None (Preliminary result)   Collection Time: 06/05/19  8:34 PM   Specimen: BLOOD  Result Value Ref Range Status   Specimen Description BLOOD LEFT ANTECUBITAL  Final   Special Requests   Final    BOTTLES DRAWN AEROBIC AND ANAEROBIC Blood Culture adequate volume   Culture   Final    NO GROWTH 3 DAYS Performed at [facility], [place] [address], [place], [place] [address]    Report Status PENDING  Incomplete  Blood culture (routine x 2)     Status: None (Preliminary result)   Collection Time: 06/05/19  9:58 PM   Specimen: BLOOD RIGHT HAND  Result Value Ref Range Status   Specimen Description BLOOD RIGHT HAND  Final   Special Requests   Final    BOTTLES DRAWN AEROBIC AND ANAEROBIC Blood Culture results may not be optimal due to an inadequate volume of blood received in culture bottles   Culture   Final    NO GROWTH 3 DAYS Performed at [facility], [place] [address], [place], [place] [address]    Report Status PENDING  Incomplete  SARS Coronavirus 2 (CEPHEID - Performed in [place] hospital lab), Hosp Order     Status: None   Collection Time: 06/06/19 12:57 AM   Specimen: Nasopharyngeal Swab  Result Value Ref Range Status   SARS Coronavirus 2 NEGATIVE NEGATIVE Final    Comment: (NOTE) If result is NEGATIVE SARS-CoV-2 target nucleic acids are NOT DETECTED. The SARS-CoV-2 RNA is generally detectable in upper and lower  respiratory specimens during the acute phase of infection. The lowest  concentration of SARS-CoV-2 viral copies this assay can detect is 250  copies / mL. A negative result does not preclude SARS-CoV-2 infection  and should not be used as the sole basis for treatment or other   patient management decisions.  A negative result may occur with  improper specimen collection / handling, submission of specimen other  than nasopharyngeal swab, presence of viral mutation(s) within the  areas targeted by this assay, and inadequate number of viral copies  (<250 copies / mL). A negative result must be combined with clinical  observations, patient history, and epidemiological information. If result is POSITIVE SARS-CoV-2 target nucleic acids are DETECTED. The SARS-CoV-2 RNA is generally detectable in upper and lower  respiratory specimens dur ing the acute phase of infection.  Positive  results are indicative of active infection with SARS-CoV-2.  Clinical  correlation with patient history and other diagnostic information is  necessary to determine patient infection status.  Positive results do  not rule out bacterial infection or co-infection with other viruses. If result is PRESUMPTIVE POSTIVE SARS-CoV-2 nucleic acids MAY BE PRESENT.   A presumptive positive result was obtained  on the submitted specimen  and confirmed on repeat testing.  While 2019 novel coronavirus  (SARS-CoV-2) nucleic acids may be present in the submitted sample  additional confirmatory testing may be necessary for epidemiological  and / or clinical management purposes  to differentiate between  SARS-CoV-2 and other Sarbecovirus currently known to infect humans.  If clinically indicated additional testing with an alternate test  methodology [phone]) is advised. The SARS-CoV-2 RNA is generally  detectable in upper and lower respiratory sp ecimens during the acute  phase of infection. The expected result is Negative. Fact Sheet for Patients:  StrictlyIdeas.no Fact Sheet for Healthcare Providers: BankingDealers.co.za This test is not yet approved or cleared by the [place] FDA and has been authorized for detection and/or diagnosis of SARS-CoV-2  by FDA under an Emergency Use Authorization (EUA).  This EUA will remain in effect (meaning this test can be used) for the duration of the COVID-19 declaration under Section 564(b)(1) of the Act, 21 U.S.C. section 360bbb-3(b)(1), unless the authorization is terminated or revoked sooner. Performed at [facility], [place] [address] [place], [place] [address]      Labs: Basic Metabolic Panel: Recent Labs  Lab 06/02/19 2327  06/04/19 0427 06/05/19 2128 06/06/19 0545 06/07/19 0412 06/08/19 0727  NA  --    < > 134* 133* 135 139 136  K  --    < > 3.4* 3.8 3.4* 4.1 4.2  CL  --    < > 96* 95* 96* 102 96*  CO2  --    < > 27 26 28 25 25   GLUCOSE  --    < > 99 142* 112* 96 279*  BUN  --    < > 32* 39* 36* 30* 32*  CREATININE  --    < > 1.81* 2.14* 2.01* 1.63* 1.68*  CALCIUM  --    < > 8.5* 8.4* 8.6* 8.7* 8.9  MG 2.0  --   --   --  1.9 1.9 2.2  PHOS  --   --  4.8*  --   --   --   --    < > = values in this interval not displayed.   Liver Function Tests: Recent Labs  Lab 06/02/19 1705 06/03/19 0237 06/04/19 0427 06/07/19 0412 06/07/19 1832 06/08/19 0727  AST 33 11*  --   --   --   --   ALT 18 14  --   --   --   --   ALKPHOS 63 62  --   --   --   --   BILITOT 1.1 0.3  --   --   --   --   PROT 6.3* 6.2*  --   --  6.5  --   ALBUMIN 2.3* 2.2* 2.0* 2.6* 2.6* 2.5*   No results for input(s): LIPASE, AMYLASE in the last 168 hours. No results for input(s): AMMONIA in the last 168 hours. CBC: Recent Labs  Lab 06/02/19 1705 06/03/19 0237  06/04/19 0427  06/05/19 2128 06/06/19 0545 06/07/19 0412 06/07/19 1832 06/08/19 0727  WBC 14.1* 13.2*   < > 12.9*   < > 14.0* 12.0* 11.8* 11.7* 10.9*  NEUTROABS 11.1* 9.4*  --  8.4*  --  11.7*  --   --   --   --   HGB 8.5* 9.3*   < > 9.2*   < > 8.6* 9.4* 8.0* 9.3* 8.6*  HCT 27.8* 30.3*   < > 29.2*   < >  27.6* 29.6* 26.0* 30.1* 28.1*  MCV 86.6 84.6   < > 83.4   < > 83.9 82.0 83.3 84.1 83.6  PLT 497* 436*   < > 404*   < > 361 350 332 338  346   < > = values in this interval not displayed.   Cardiac Enzymes: Recent Labs  Lab 06/02/19 1705 06/02/19 2327 06/03/19 0237 06/03/19 1500  TROPONINI 0.04* 0.04* 0.04* 0.04*   BNP: BNP (last 3 results) Recent Labs    05/15/19 1630 06/02/19 1705 06/05/19 2128  BNP 1,112.4* 363.6* 264.6*    ProBNP (last 3 results) No results for input(s): PROBNP in the last 8760 hours.  CBG: No results for input(s): GLUCAP in the last 168 hours.     Signed:  Dia Crawford, MD [facility] [phone] pager

## 2019-06-08 NOTE — TOC Transition Note (Signed)
Transition of Care [facility]) - CM/SW Discharge Note   Patient Details  Name: Andre [name] MRN: [id number] Date of Birth: 1922/12/26  Transition of Care [facility]) CM/SW Contact:  Claudie Leach, RN Phone Number: 06/08/2019, 1:41 PM   Clinical Narrative:    Pt to dc home with [facility] collective and Pleurx drain.  D/W [facility] of [facility] this am to confirm plan.  Hospice will plan admission visit for tomorrow but will be available tonight if patient should need something.  Pt's daughter came into room for education of Pleurx drain management.  [facility] faxed order form and facesheet.  Forms will also be mailed and copies sent to [facility].  Patient Goals and CMS Choice   CMS Medicare.gov Compare Post Acute Care list provided to:: Other (Comment Required)(Daughter) Choice offered to / list presented to : Adult Children   Discharge Plan and Services In-house Referral: Hospice / [facility] Acute Care Choice: Hospice            [facility] Agency: Hospice and [facility] Date [place]: 06/07/19 Time [place]: 1317 Representative spoke with at [place]: Sherri   Readmission Risk Interventions Readmission Risk Prevention Plan 06/05/2019 05/20/2019 03/08/2019  Transportation Screening Complete Complete Complete  PCP or Specialist Appt within 5-7 Days - - Complete  PCP or Specialist Appt within 3-5 Days - Not Complete -  Not Complete comments - not yet ready for d/c -  Home Care Screening - - Complete  Medication Review (RN CM) - - Complete  HRI or [place] - Complete -  Social Work Consult for [place] Planning/Counseling - Complete -  Palliative Care Screening - Not Applicable -  Medication Review Press photographer) Complete Complete -  PCP or Specialist appointment within 3-5 days of discharge Complete - -  [place] or Home Care Consult Complete - -  SW Recovery Care/Counseling Consult Complete - -  Palliative Care Screening Not  Applicable - -  [place] Not Applicable - -  Some recent data might be hidden

## 2019-06-08 NOTE — Progress Notes (Signed)
Remote pacemaker transmission.   

## 2019-06-10 LAB — CULTURE, BLOOD (ROUTINE X 2)
Culture: NO GROWTH
Culture: NO GROWTH
Special Requests: ADEQUATE

## 2019-06-10 NOTE — Telephone Encounter (Signed)
Yes thanks 

## 2019-06-11 ENCOUNTER — Telehealth: Payer: Self-pay

## 2019-06-11 NOTE — Telephone Encounter (Signed)
Spoke with patient he stated he don't thinks its necessary to have a Hospital follow up.He stated he is doing good at home.Hospice is providing all of his care.He is no longer taking his medications.He has Morphine and Sleeping medication if needed,but not had to use it. Dr.Hunter he wants to let you know he appreciate all that you have done.Also stated if you think its absolutely necessary that he needs to be seen.

## 2019-06-11 NOTE — Telephone Encounter (Signed)
Nope- just wanted to offer a visit- certainly not required. Happy to help anyway we can.

## 2019-06-12 ENCOUNTER — Other Ambulatory Visit: Payer: Self-pay

## 2019-06-12 NOTE — Patient Outreach (Signed)
[facility]) Care Management  06/12/2019  Andre [name] February 23, 1923 [id number]  EMMI: general discharge Referral date: 06/12/19 Referral reason: scheduled follow up  Insurance:  Medicare Day # 1  Telephone call to patient regarding EMMI general discharge red alert. HIPAA verified with patient. RNCM introduced herself and explained reason for call.  Patient states he spoke with his doctors office over the phone and he was advised that it was not necessary for him to come in the office.   Patient states he has been taken off all medications.  Patient states he is feeling good and doing his exercises. Patient states Hospice is providing his services.  Patient requested EMMI calls be discontinued.    PLAN: RNCM will close case due to patient being enrolled in an external program.  RNCM will request [facility] care management assistance discontinue EMMI general automated calls at patients request.   Quinn Plowman RN,BSN,CCM [facility] Telephonic  [phone]

## 2019-06-12 NOTE — Telephone Encounter (Signed)
Called pt and advised. He thanks Dr. Yong Channel for all that he has done for him.

## 2019-06-13 ENCOUNTER — Telehealth: Payer: Self-pay

## 2019-06-13 NOTE — Telephone Encounter (Signed)
Pharmacy calling to find out of pt is taking Lasix 20 mg, Lasix 40 mg or both. Call back [phone] ref # [id number]

## 2019-06-13 NOTE — Telephone Encounter (Signed)
Pt is to take furosemide 20 mg per chart. This medication looks like it is suppose to be handled by [name].  Dr. Yong Channel please advise on correct strength.

## 2019-06-13 NOTE — Telephone Encounter (Signed)
Called and spoke with patient. Confirmed that he is not taking Lasix at all, it was d/c when he was in the ED. Advised pt that I will send fax back letting his pharmacy know med has been d/c so they wont send it. Pt is agreeable.

## 2019-06-15 ENCOUNTER — Encounter (HOSPITAL_COMMUNITY): Payer: Self-pay | Admitting: Interventional Radiology

## 2019-06-17 ENCOUNTER — Telehealth: Payer: Self-pay

## 2019-06-17 NOTE — Telephone Encounter (Signed)
Received a call from [facility] per front office staff. However, caller hung up. PEC provided me with the number [phone]. Called provided number but no answer. Unsure of the reason for the call.

## 2019-06-18 ENCOUNTER — Encounter: Payer: Self-pay | Admitting: Family Medicine

## 2019-06-20 NOTE — Telephone Encounter (Signed)
Chrislyn Edison Pace, Pt's hospice Nurse with [facility] called to speak with Andre [name] but she was unavailable. Asked for a callback at [phone]. Can leave vm. Did not give details about reason for call.

## 2019-06-20 NOTE — Telephone Encounter (Signed)
See note

## 2019-06-20 NOTE — Telephone Encounter (Signed)
Called Chrislyn and left VM to call the office.

## 2019-07-01 ENCOUNTER — Encounter: Payer: Self-pay | Admitting: Family Medicine

## 2019-07-03 ENCOUNTER — Encounter (HOSPITAL_COMMUNITY): Payer: Medicare Other

## 2019-07-13 DEATH — deceased

## 2019-09-04 ENCOUNTER — Encounter: Payer: Medicare Other | Admitting: Family Medicine

## 2019-09-05 ENCOUNTER — Encounter: Payer: Medicare Other | Admitting: Internal Medicine

## 2020-06-17 IMAGING — DX PORTABLE CHEST - 1 VIEW
1 series · 1 of 1 positions shown · non-contrast
Comparison: 12/08/2010

CLINICAL DATA: Shortness of breath

EXAM:
PORTABLE CHEST 1 VIEW

[chest]
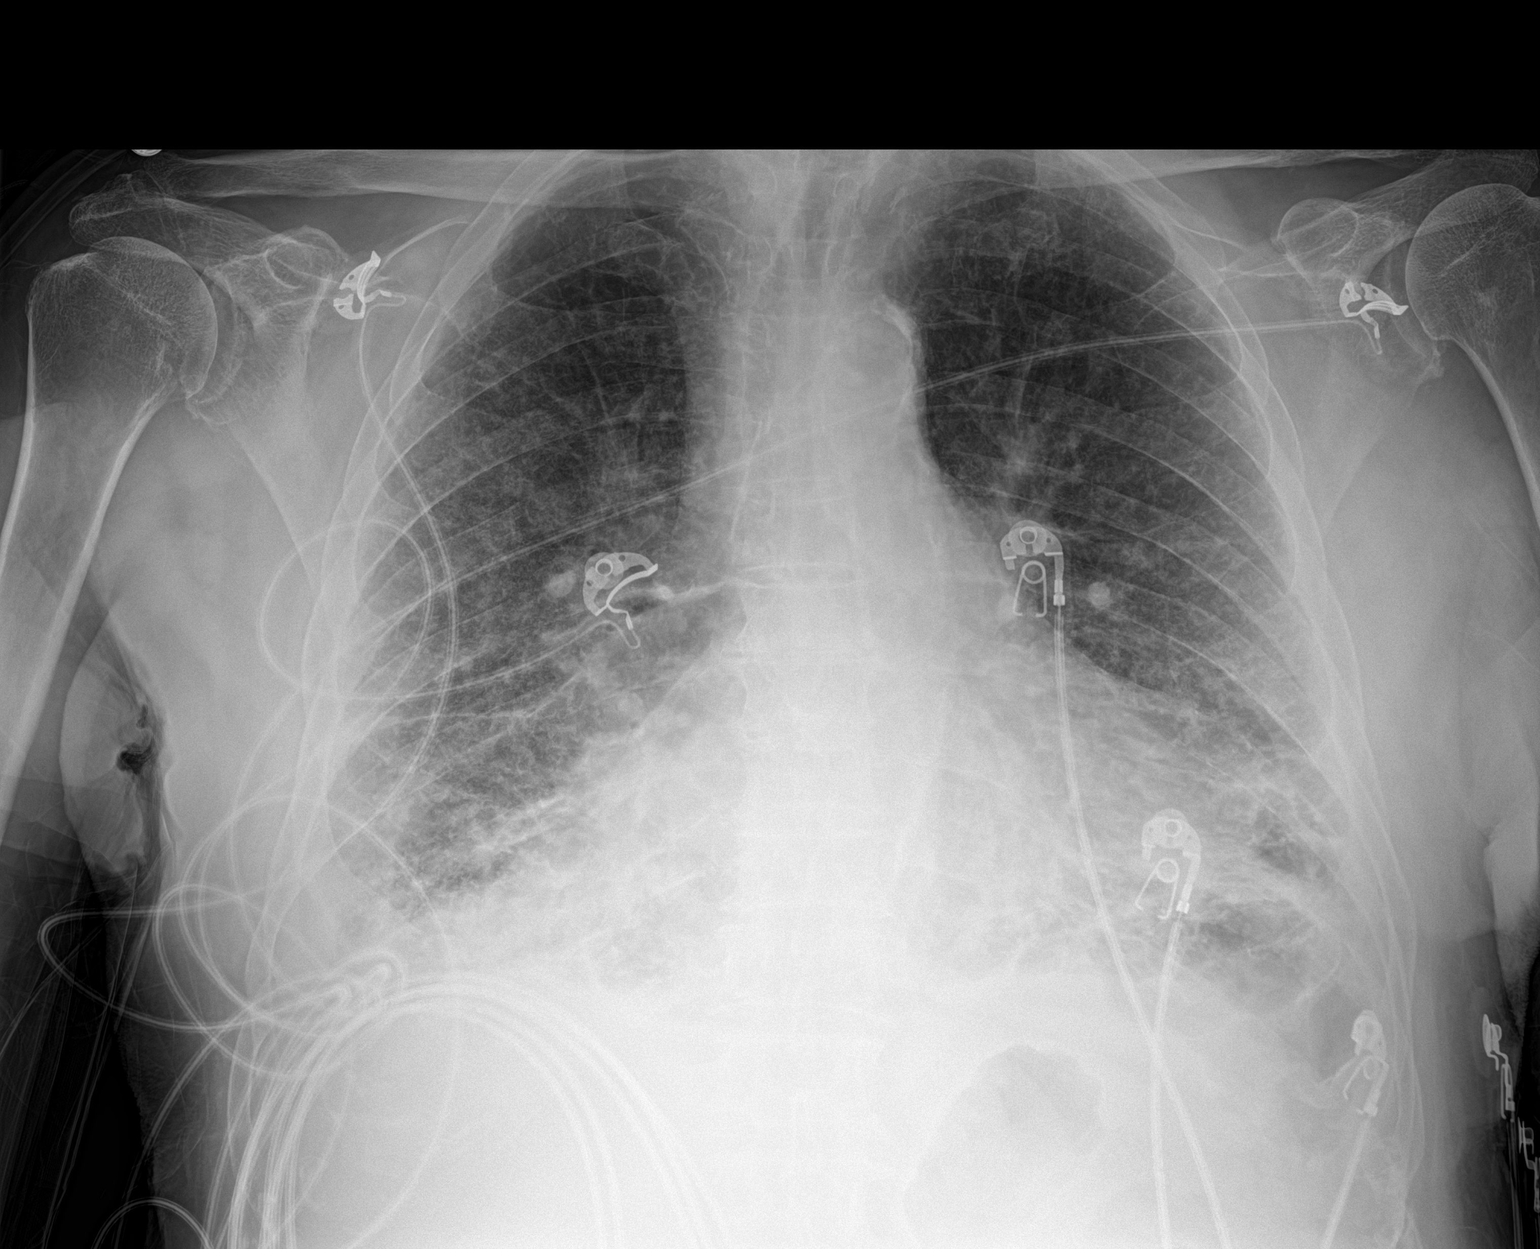

[1 of 1 positions shown; findings below may reference images not displayed]

FINDINGS: Mild subpleural reticulation/fibrosis in the lungs bilaterally.
Pulmonary vascular congestion/possible mild interstitial edema.
Suspected small bilateral pleural effusions. No pneumothorax.

The heart is normal in size.
IMPRESSION: Suspected mild interstitial edema with small bilateral pleural
effusions.

Possible underlying chronic interstitial lung disease.

## 2020-06-19 IMAGING — DX PORTABLE CHEST - 1 VIEW
1 series · 1 of 1 positions shown · non-contrast
Comparison: 02/23/2019

CLINICAL DATA: Status post pacemaker placement

EXAM:
PORTABLE CHEST 1 VIEW

[chest ap]
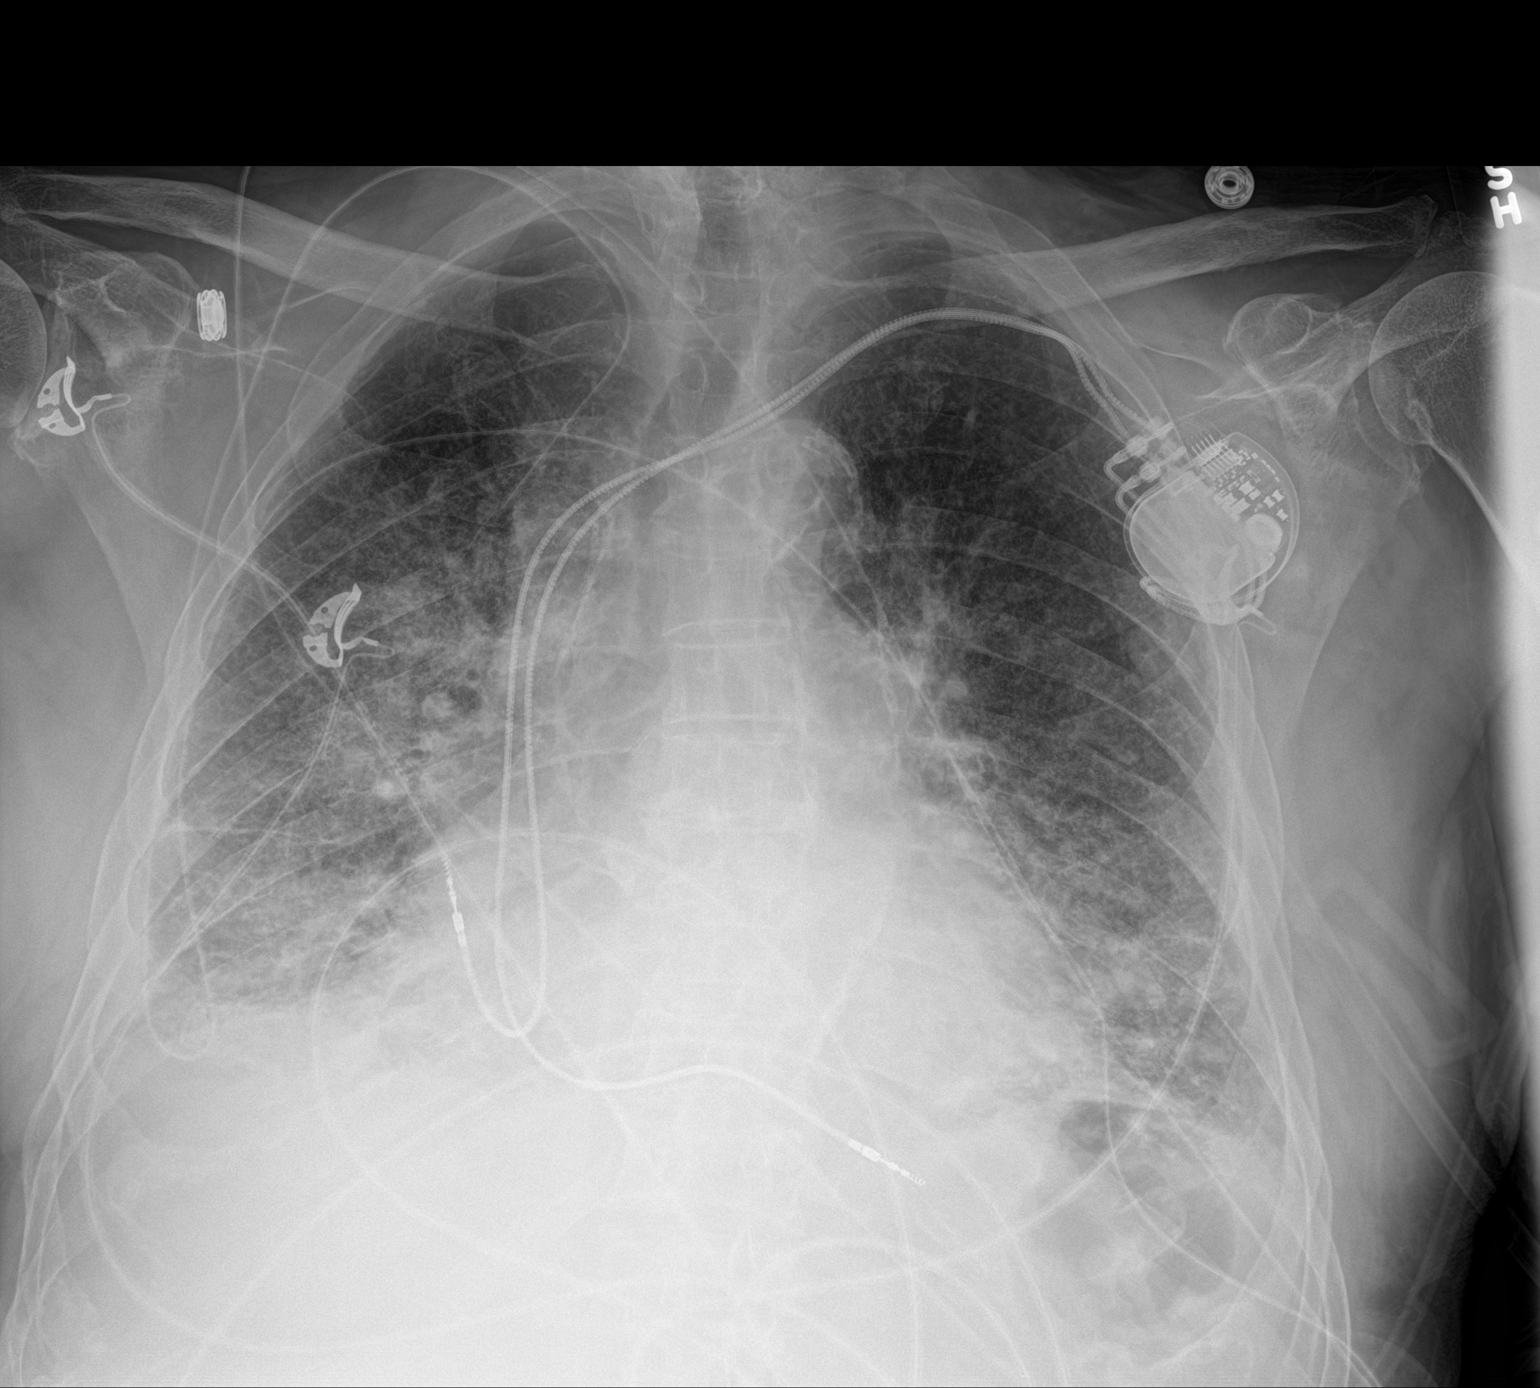

[1 of 1 positions shown; findings below may reference images not displayed]

FINDINGS: Cardiac shadow remains enlarged. Pacing device is now seen. No
pneumothorax is noted. Vascular congestion is noted increased from
the prior exam with increasing interstitial edema. Small right
pleural effusion is noted. No focal confluent infiltrate is seen.
IMPRESSION: Increasing vascular congestion with interstitial edema.

No pneumothorax following pacemaker placement.

## 2020-06-22 IMAGING — DX CHEST - 2 VIEW
2 series · 2 of 2 positions shown · non-contrast
Comparison: 02/25/2019.

CLINICAL DATA: Shortness of breath.

EXAM:
CHEST - 2 VIEW

[w chest pa]
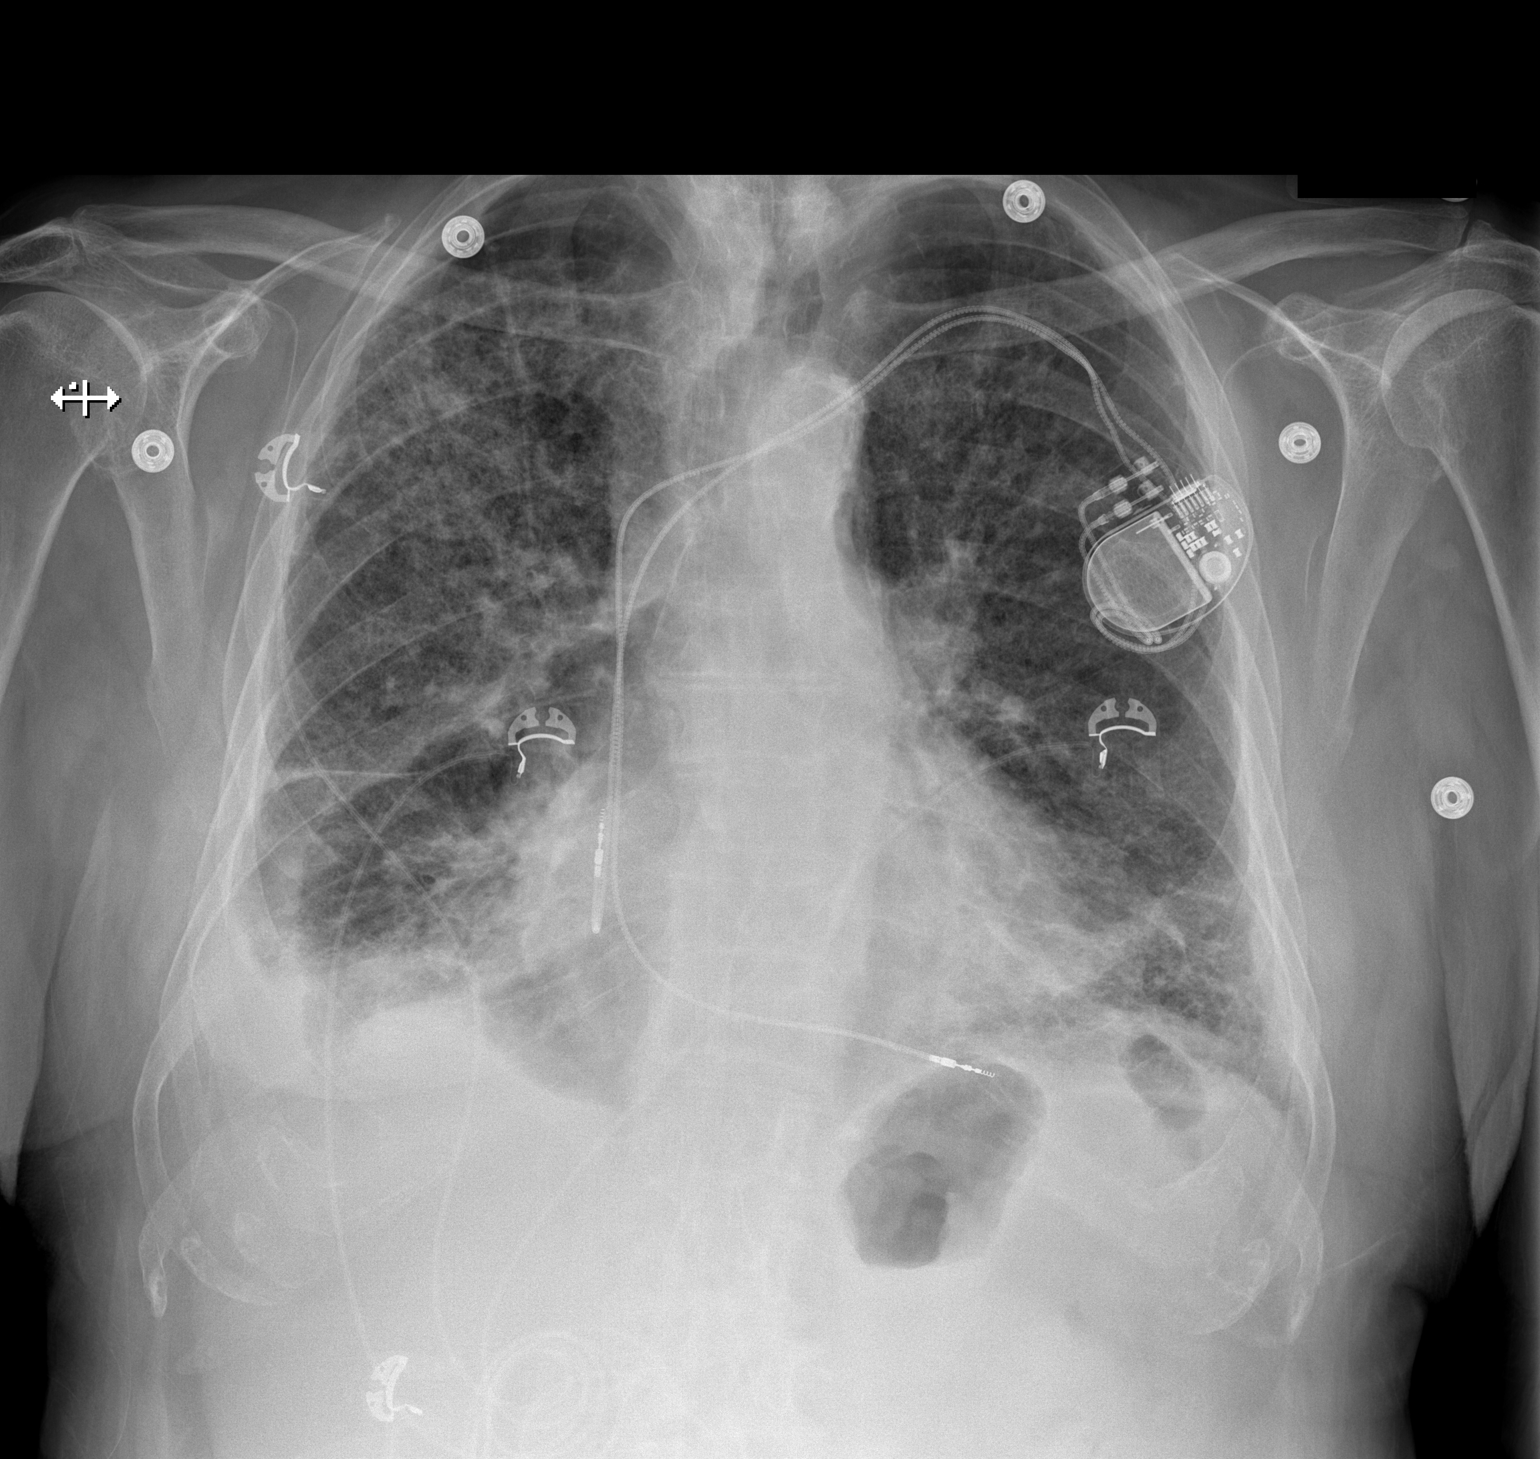

[w chest lat]
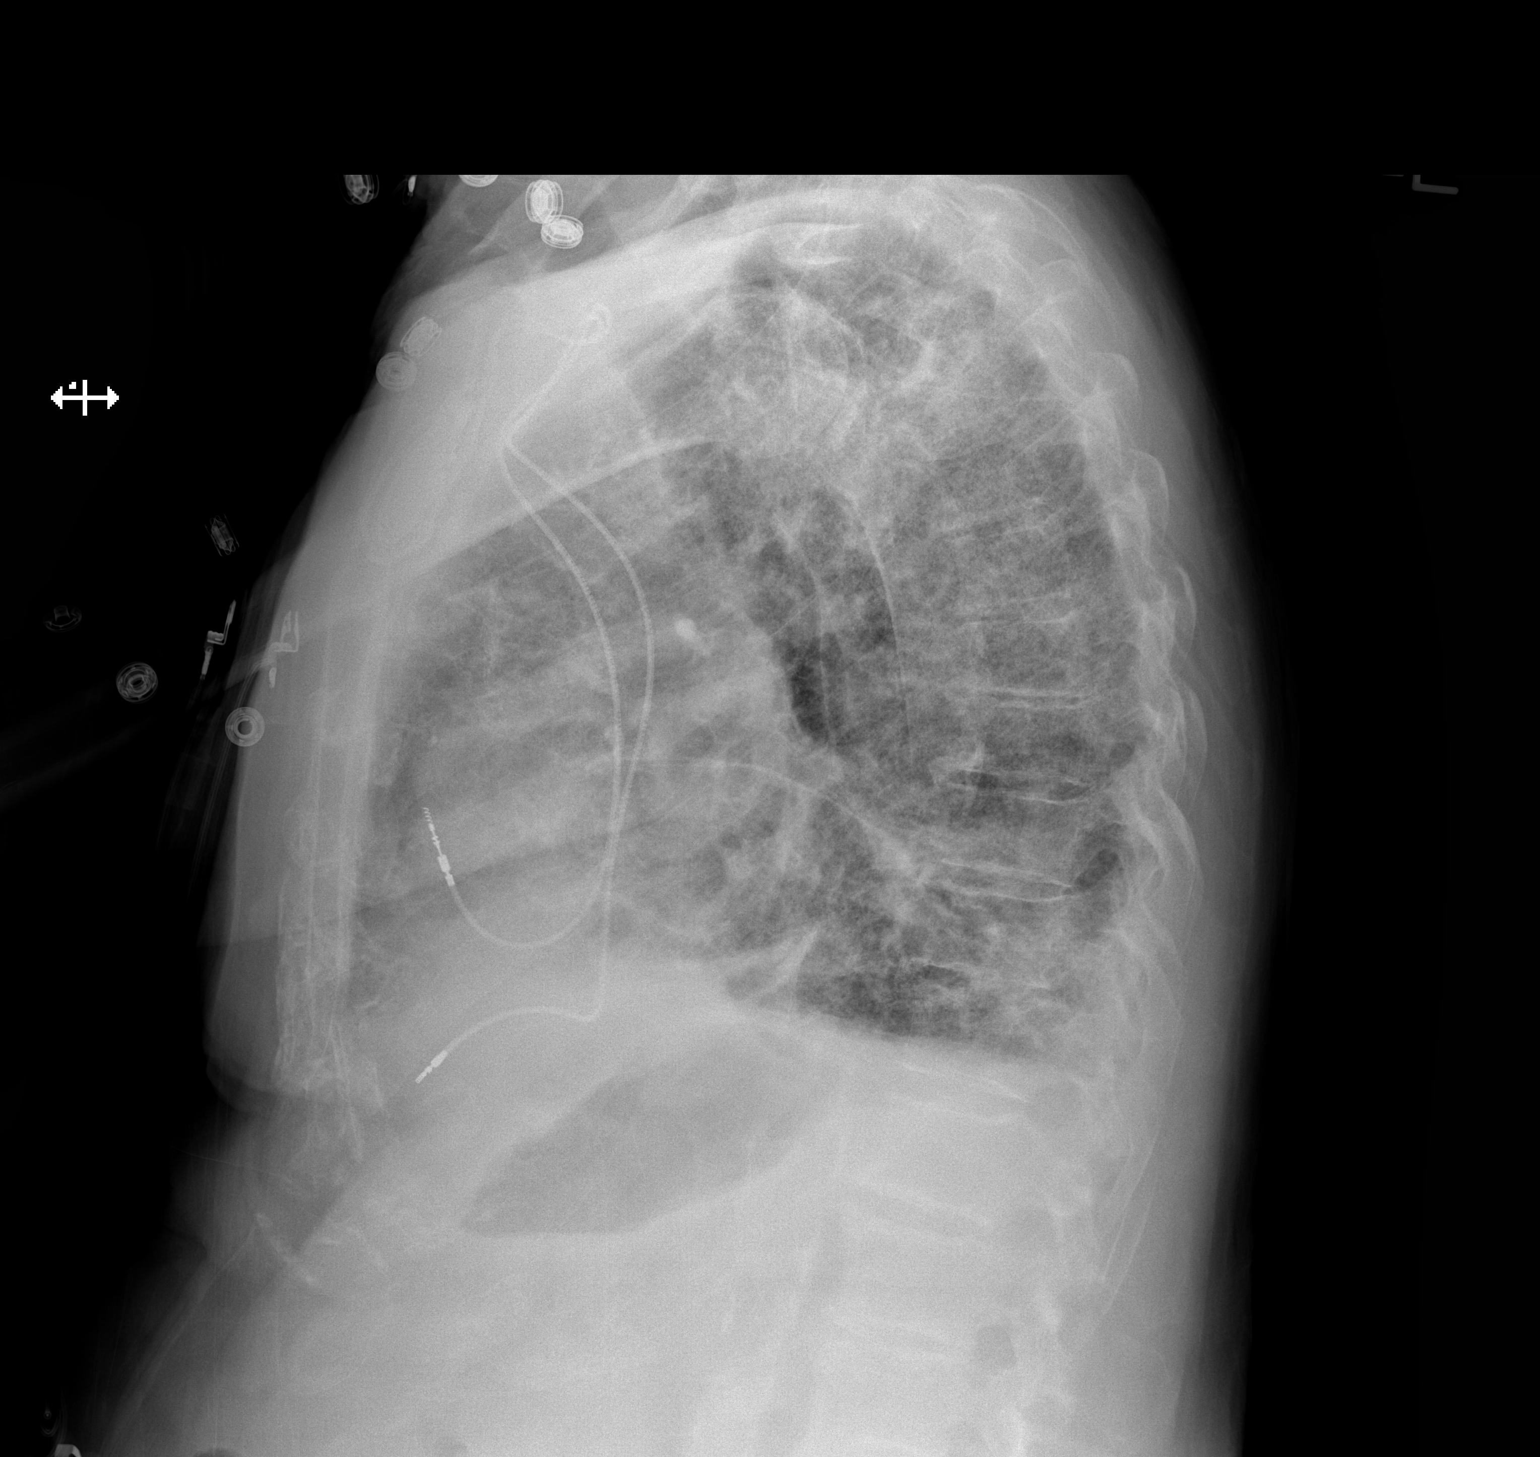

[2 of 2 positions shown; findings below may reference images not displayed]

FINDINGS: Cardiac pacer with lead tip over the right atrium right ventricle.
Cardiomegaly. Diffuse bilateral pulmonary interstitial prominence
slightly improved from prior exam. Right-sided pleural effusion
again noted. Similar finding noted on prior exam no pneumothorax
IMPRESSION: Cardiac pacer with lead tip over the right atrium right ventricle.
Cardiomegaly with diffuse bilateral pulmonary interstitial
prominence. Interstitial prominence has improved slightly from prior
exam. Findings suggest improving congestive heart failure.
Pneumonitis can not be excluded. Right-sided pleural effusion again
noted. Similar finding noted on prior exam.

## 2020-06-25 IMAGING — DX PORTABLE CHEST - 1 VIEW
1 series · 1 of 1 positions shown · non-contrast
Comparison: Prior radiograph from 03/02/2019

CLINICAL DATA: Initial evaluation for acute hypoxia.

EXAM:
PORTABLE CHEST 1 VIEW

[chest ap]
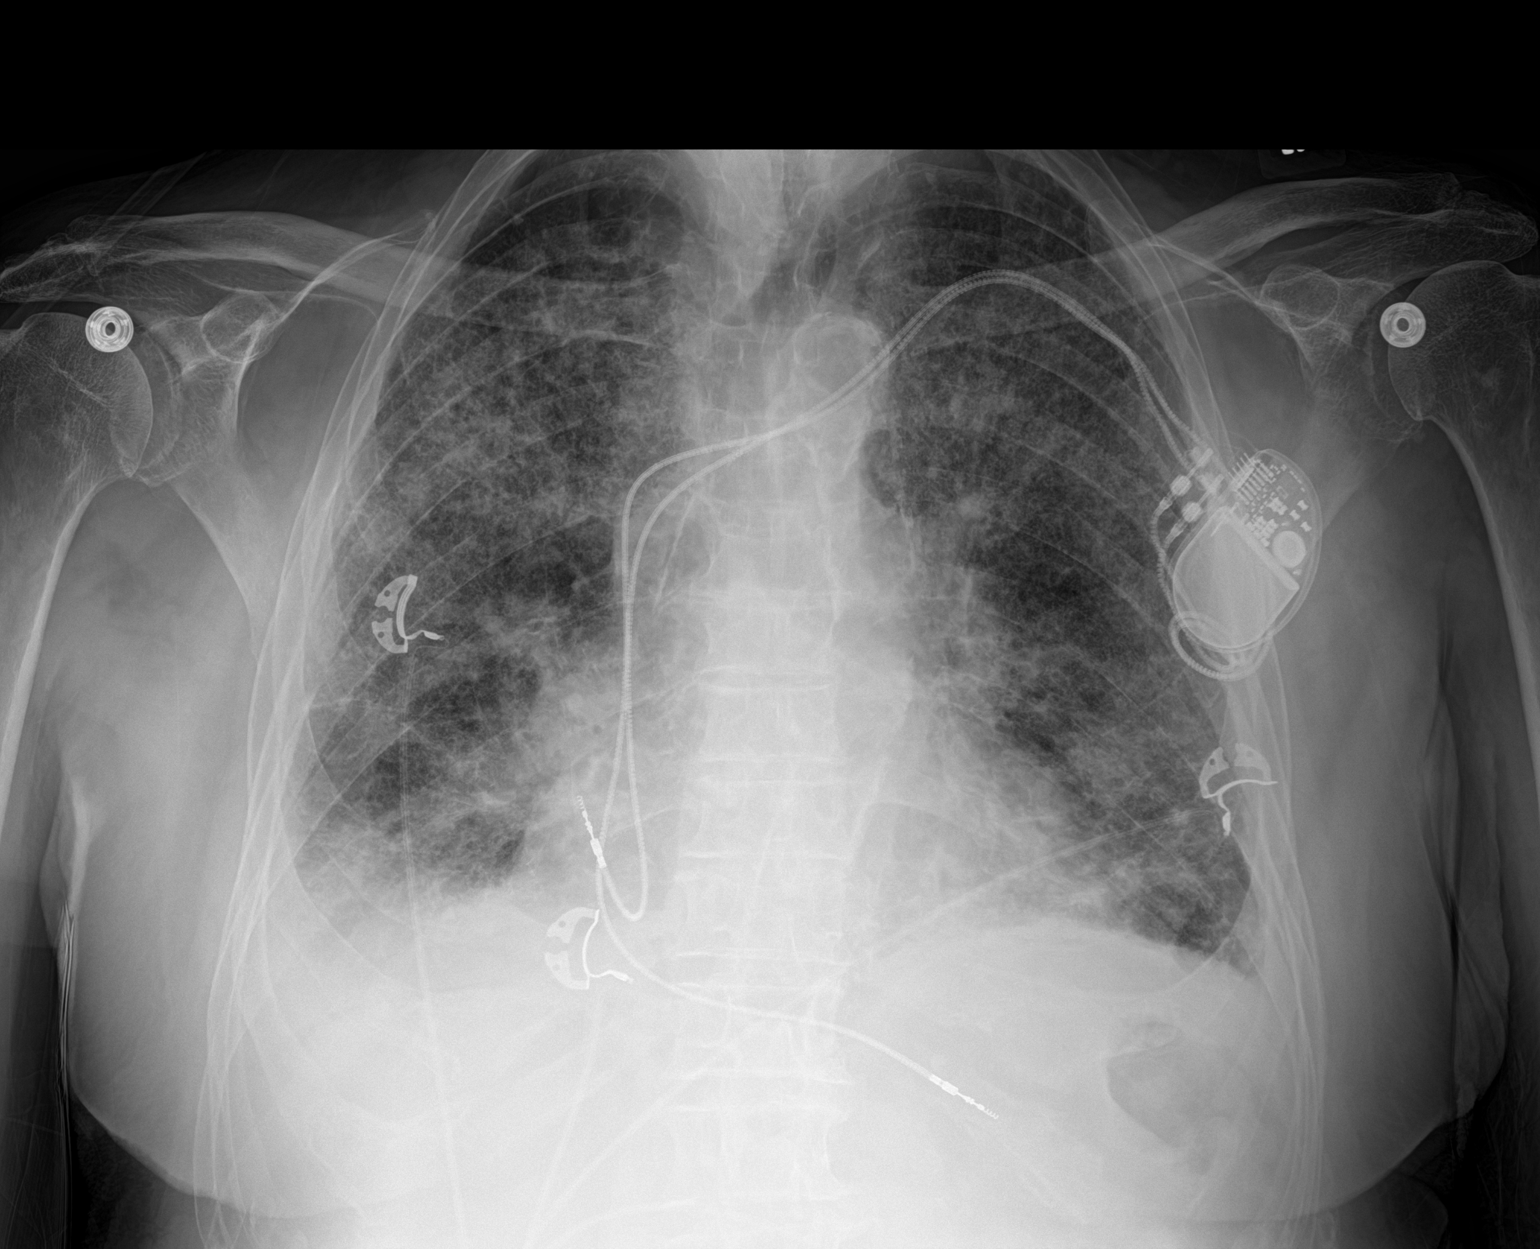

[1 of 1 positions shown; findings below may reference images not displayed]

FINDINGS: Left-sided pacemaker/AICD noted, stable. Transverse heart size
unchanged. Mediastinal silhouette normal. Aortic atherosclerosis.

Current exam has been performed with a slightly more shallow degree
of lung inflation. Extensive bilateral airspace disease again seen,
mildly worsened within the upper lobes bilaterally as compared to
previous exam. Findings suspected to reflect a degree of worsened
pulmonary interstitial edema with worsened perihilar vascular
congestion. Small right pleural effusion has mildly increased from
previous. Trace left effusion relatively unchanged. No pneumothorax.

Osseous structures unchanged.
IMPRESSION: 1. Slight interval worsening in bilateral airspace disease, favored
to reflect mildly worsened pulmonary interstitial edema.
2. Slight interval increase in size of small right pleural effusion.
